# Patient Record
Sex: Female | Born: 1960 | Race: White | Hispanic: No | Marital: Married | Smoking: Never smoker
Health system: Southern US, Community
[De-identification: ages and names within clinical notes are randomized; demographics above are authoritative.]

## PROBLEM LIST (undated history)

## (undated) DIAGNOSIS — R5381 Other malaise: Secondary | ICD-10-CM

## (undated) DIAGNOSIS — I10 Essential (primary) hypertension: Secondary | ICD-10-CM

## (undated) DIAGNOSIS — M2550 Pain in unspecified joint: Secondary | ICD-10-CM

## (undated) DIAGNOSIS — R001 Bradycardia, unspecified: Secondary | ICD-10-CM

## (undated) DIAGNOSIS — R600 Localized edema: Secondary | ICD-10-CM

## (undated) DIAGNOSIS — I480 Paroxysmal atrial fibrillation: Secondary | ICD-10-CM

## (undated) DIAGNOSIS — I4510 Unspecified right bundle-branch block: Secondary | ICD-10-CM

## (undated) DIAGNOSIS — E785 Hyperlipidemia, unspecified: Secondary | ICD-10-CM

## (undated) DIAGNOSIS — M549 Dorsalgia, unspecified: Secondary | ICD-10-CM

## (undated) DIAGNOSIS — G8929 Other chronic pain: Secondary | ICD-10-CM

## (undated) DIAGNOSIS — N200 Calculus of kidney: Secondary | ICD-10-CM

## (undated) DIAGNOSIS — G4730 Sleep apnea, unspecified: Secondary | ICD-10-CM

## (undated) DIAGNOSIS — R5383 Other fatigue: Secondary | ICD-10-CM

## (undated) HISTORY — DX: Hyperlipidemia, unspecified: E78.5

## (undated) HISTORY — DX: Paroxysmal atrial fibrillation: I48.0

## (undated) HISTORY — PX: LITHOTRIPSY: SUR834

## (undated) HISTORY — DX: Essential (primary) hypertension: I10

## (undated) HISTORY — DX: Other fatigue: R53.83

## (undated) HISTORY — DX: Unspecified right bundle-branch block: I45.10

## (undated) HISTORY — PX: INCISION AND DRAINAGE ABSCESS: SHX5864

## (undated) HISTORY — DX: Pain in unspecified joint: M25.50

## (undated) HISTORY — DX: Localized edema: R60.0

## (undated) HISTORY — DX: Other malaise: R53.81

---

## 1998-09-10 ENCOUNTER — Ambulatory Visit (HOSPITAL_COMMUNITY): Admission: RE | Admit: 1998-09-10 | Discharge: 1998-09-10 | Payer: Self-pay | Admitting: *Deleted

## 1998-09-10 ENCOUNTER — Encounter: Payer: Self-pay | Admitting: Family Medicine

## 1999-01-10 ENCOUNTER — Emergency Department (HOSPITAL_COMMUNITY): Admission: EM | Admit: 1999-01-10 | Discharge: 1999-01-10 | Payer: Self-pay | Admitting: Emergency Medicine

## 1999-01-10 ENCOUNTER — Encounter: Payer: Self-pay | Admitting: Emergency Medicine

## 1999-12-08 ENCOUNTER — Emergency Department (HOSPITAL_COMMUNITY): Admission: EM | Admit: 1999-12-08 | Discharge: 1999-12-08 | Payer: Self-pay

## 2001-01-23 ENCOUNTER — Other Ambulatory Visit: Admission: RE | Admit: 2001-01-23 | Discharge: 2001-01-23 | Payer: Self-pay | Admitting: Obstetrics and Gynecology

## 2001-08-01 ENCOUNTER — Emergency Department (HOSPITAL_COMMUNITY): Admission: EM | Admit: 2001-08-01 | Discharge: 2001-08-01 | Payer: Self-pay | Admitting: Emergency Medicine

## 2001-08-01 ENCOUNTER — Encounter: Payer: Self-pay | Admitting: Emergency Medicine

## 2001-08-11 ENCOUNTER — Ambulatory Visit (HOSPITAL_BASED_OUTPATIENT_CLINIC_OR_DEPARTMENT_OTHER): Admission: RE | Admit: 2001-08-11 | Discharge: 2001-08-11 | Payer: Self-pay | Admitting: Urology

## 2001-08-14 ENCOUNTER — Ambulatory Visit (HOSPITAL_COMMUNITY): Admission: RE | Admit: 2001-08-14 | Discharge: 2001-08-14 | Payer: Self-pay | Admitting: Urology

## 2001-08-14 ENCOUNTER — Encounter: Payer: Self-pay | Admitting: Urology

## 2001-08-28 ENCOUNTER — Encounter: Payer: Self-pay | Admitting: Urology

## 2001-08-28 ENCOUNTER — Encounter: Admission: RE | Admit: 2001-08-28 | Discharge: 2001-08-28 | Payer: Self-pay | Admitting: Urology

## 2001-11-28 ENCOUNTER — Emergency Department (HOSPITAL_COMMUNITY): Admission: EM | Admit: 2001-11-28 | Discharge: 2001-11-28 | Payer: Self-pay | Admitting: Emergency Medicine

## 2001-11-29 ENCOUNTER — Emergency Department (HOSPITAL_COMMUNITY): Admission: EM | Admit: 2001-11-29 | Discharge: 2001-11-29 | Payer: Self-pay

## 2002-10-20 ENCOUNTER — Inpatient Hospital Stay (HOSPITAL_COMMUNITY): Admission: EM | Admit: 2002-10-20 | Discharge: 2002-10-22 | Payer: Self-pay | Admitting: Emergency Medicine

## 2002-10-23 ENCOUNTER — Encounter (HOSPITAL_COMMUNITY): Admission: RE | Admit: 2002-10-23 | Discharge: 2002-11-22 | Payer: Self-pay | Admitting: General Surgery

## 2002-12-24 ENCOUNTER — Emergency Department (HOSPITAL_COMMUNITY): Admission: EM | Admit: 2002-12-24 | Discharge: 2002-12-24 | Payer: Self-pay | Admitting: Emergency Medicine

## 2003-10-03 ENCOUNTER — Emergency Department (HOSPITAL_COMMUNITY): Admission: EM | Admit: 2003-10-03 | Discharge: 2003-10-03 | Payer: Self-pay | Admitting: Emergency Medicine

## 2009-10-10 ENCOUNTER — Encounter: Payer: Self-pay | Admitting: Cardiology

## 2009-12-01 ENCOUNTER — Encounter: Payer: Self-pay | Admitting: Cardiology

## 2009-12-15 ENCOUNTER — Encounter: Payer: Self-pay | Admitting: Cardiology

## 2009-12-23 ENCOUNTER — Encounter: Payer: Self-pay | Admitting: Cardiology

## 2010-01-15 ENCOUNTER — Encounter: Payer: Self-pay | Admitting: Cardiology

## 2010-01-16 ENCOUNTER — Ambulatory Visit: Payer: Self-pay | Admitting: Cardiology

## 2010-01-16 DIAGNOSIS — R5381 Other malaise: Secondary | ICD-10-CM | POA: Insufficient documentation

## 2010-01-16 DIAGNOSIS — R5383 Other fatigue: Secondary | ICD-10-CM

## 2010-01-16 DIAGNOSIS — R609 Edema, unspecified: Secondary | ICD-10-CM | POA: Insufficient documentation

## 2010-01-16 DIAGNOSIS — I498 Other specified cardiac arrhythmias: Secondary | ICD-10-CM | POA: Insufficient documentation

## 2010-03-05 ENCOUNTER — Ambulatory Visit: Payer: Self-pay | Admitting: Cardiology

## 2010-03-05 ENCOUNTER — Encounter: Payer: Self-pay | Admitting: Cardiology

## 2010-03-05 ENCOUNTER — Ambulatory Visit: Payer: Self-pay | Admitting: Cardiovascular Disease

## 2010-03-05 ENCOUNTER — Ambulatory Visit: Payer: Self-pay

## 2010-03-05 ENCOUNTER — Ambulatory Visit (HOSPITAL_COMMUNITY): Admission: RE | Admit: 2010-03-05 | Discharge: 2010-03-05 | Payer: Self-pay | Admitting: Cardiology

## 2010-10-09 ENCOUNTER — Telehealth (INDEPENDENT_AMBULATORY_CARE_PROVIDER_SITE_OTHER): Payer: Self-pay | Admitting: *Deleted

## 2010-10-20 NOTE — Letter (Signed)
Summary: [facility] Office Note   [facility] Office Note   Imported By: Roderic Ovens 02/10/2010 14:39:47  _____________________________________________________________________  External Attachment:    Type:   Image     Comment:   External Document

## 2010-10-20 NOTE — Letter (Signed)
Summary: [facility] Office Note  [facility] Office Note   Imported By: Roderic Ovens 02/10/2010 14:38:53  _____________________________________________________________________  External Attachment:    Type:   Image     Comment:   External Document

## 2010-10-20 NOTE — Progress Notes (Signed)
Summary: [facility]   [facility]   Imported By: Roderic Ovens 03/19/2010 10:10:15  _____________________________________________________________________  External Attachment:    Type:   Image     Comment:   External Document

## 2010-10-20 NOTE — Assessment & Plan Note (Signed)
Summary: NP6/HYPERTENSION   Visit Type:  Initial Consult Primary Provider:  Briscoe Burns, PA-C [facility])  CC:  Bradycardia.  History of Present Illness: The patient presents for evaluation of bradycardia. She has no prior cardiac history. However, she has been noted to have bradycardia. She reports that her heart rate is typically in the 50s and though she exercises vigorously and routinely she reports that it does not go up with exercise. Though she has had no frank syncopal episodes she says that she does have occasional episodes of dizziness. She can exercise and does routinely every day. On some days she feels quite fatigued with this though on other days she may do well. She does not describe chest pressure, neck or arm discomfort. She does not describe PND or orthopnea. She thinks her breathing with exercise is reasonable.  Of note the patient has had long-standing diabetes and hypertension. She has been morbidly obese but over 2 years to exercise and diet has lost 120 pounds. As this has happened her blood pressure and diabetes have been easier to control. Recently her hydrochlorothiazide and lisinopril were stopped to see if these might be contributing to bradycardia. When this happened she gained 10 pounds with edema in one week. She restarted those medications and the weight came off. She has since discontinued the lisinopril because blood pressures have been low.  The patient's evaluation has included labs to include a normal thyroid. She wore a Holter monitor which demonstrated bradycardic rates in the 40s and 50s with no apparent sustained pauses. I do not see a report of the chronotropic response or upper limit. There was infrequent ectopy.  Current Medications (verified): 1)  Hydrochlorothiazide 25 Mg Tabs (Hydrochlorothiazide) .Marland Kitchen.. 1 By Mouth Daily 2)  Naprosyn 250 Mg Tabs (Naproxen) .Marland Kitchen.. 1 By Mouth Qam  Allergies (verified): No Known Drug Allergies  Past  History:  Past Medical History: Htn x 10 years Diabetes x 10 years Neprholithiasis  Past Surgical History: C section x 2 Lithotripsy  Family History: There is no family history of early coronary artery disease, cardiomyopathy, syncope, sudden death, arrhythmias.  Social History: The patient is married. She has 2 children. She is a childcare provider. She's never smoked cigarettes and does not drink alcohol.  Review of Systems       Positive for lower extremity edema, joint pains. Otherwise as stated in the history of present illness negative for all other systems.  Vital Signs:  Patient profile:   50 year old female Height:      63 inches Weight:      165 pounds BMI:     29.33 Pulse rate:   46 / minute Resp:     16 per minute BP sitting:   134 / 82  (left arm)  Vitals Entered By: Marrion Coy, CNA (January 16, 2010 10:10 AM)  Physical Exam  General:  Well developed, well nourished, in no acute distress. Head:  normocephalic and atraumatic Eyes:  PERRLA/EOM intact; conjunctiva and lids normal. Mouth:  Teeth, gums and palate normal. Oral mucosa normal. Neck:  Neck supple, no JVD. No masses, thyromegaly or abnormal cervical nodes. Chest Wall:  no deformities or breast masses noted Lungs:  Clear bilaterally to auscultation and percussion. Abdomen:  Bowel sounds positive; abdomen soft and non-tender without masses, organomegaly, or hernias noted. No hepatosplenomegaly. Msk:  Back normal, normal gait. Muscle strength and tone normal. Extremities:  No clubbing or cyanosis. Neurologic:  Alert and oriented x 3. Skin:  Intact  without lesions or rashes. Cervical Nodes:  no significant adenopathy Axillary Nodes:  no significant adenopathy Inguinal Nodes:  no significant adenopathy Psych:  Normal affect.   Detailed Cardiovascular Exam  Neck    Carotids: Carotids full and equal bilaterally without bruits.      Neck Veins: Normal, no JVD.    Heart    Inspection: no  deformities or lifts noted.      Palpation: normal PMI with no thrills palpable.      Auscultation: regular rate and rhythm, S1, S2 without murmurs, rubs, gallops, or clicks.    Vascular    Abdominal Aorta: no palpable masses, pulsations, or audible bruits.      Femoral Pulses: normal femoral pulses bilaterally.      Pedal Pulses: normal pedal pulses bilaterally.      Radial Pulses: normal radial pulses bilaterally.      Peripheral Circulation: no clubbing, cyanosis, or edema noted with normal capillary refill.     EKG  Procedure date:  12/16/2009  Findings:      Sinus bradycardia, rate 43, axis within normal limits, intervals within normal limits, no acute ST-T wave changes  Impression & Recommendations:  Problem # 1:  BRADYCARDIA (ICD-427.89) I walked the patient around the office today and with brisk walking her heart rate went up to 100. I suspect she has a condition heart with resultant bradycardia. However, to further judge chronotropic competence I will put her on a treadmill. This will also allow me to screen for obstructive coronary disease in a patient with significant risk factors. Orders: Treadmill (Treadmill) Echocardiogram (Echo)  Problem # 2:  EDEMA (ICD-782.3) I am quite concerned about the volume retention she had when coming off her diuretic. To further evaluate this she will have an echocardiogram. Orders: Treadmill (Treadmill) Echocardiogram (Echo)  Problem # 3:  FATIGUE (ICD-780.79) This will be evaluated in the context of looking at her bradycardia arrhythmia. Orders: Treadmill (Treadmill) Echocardiogram (Echo)  Patient Instructions: 1)  Your physician recommends that you schedule a follow-up appointment AT TIME OF TREADMILL 2)  Your physician recommends that you continue on your current medications as directed. Please refer to the Current Medication list given to you today. 3)  Your physician has requested that you have an exercise tolerance test.  For  further information please visit [email or web address]  Please also follow instruction sheet, as given. 4)  Your physician has requested that you have an echocardiogram.  Echocardiography is a painless test that uses sound waves to create images of your heart. It provides your doctor with information about the size and shape of your heart and how well your heart's chambers and valves are working.  This procedure takes approximately one hour. There are no restrictions for this procedure.

## 2010-10-20 NOTE — Letter (Signed)
Summary: [facility] Office Note   [facility] Office Note   Imported By: Roderic Ovens 02/10/2010 14:39:28  _____________________________________________________________________  External Attachment:    Type:   Image     Comment:   External Document

## 2010-10-20 NOTE — Letter (Signed)
Summary: [facility] Office Note   [facility] Office Note   Imported By: Roderic Ovens 02/10/2010 14:41:21  _____________________________________________________________________  External Attachment:    Type:   Image     Comment:   External Document

## 2010-10-22 NOTE — Progress Notes (Addendum)
  ROI mailed to Pt  [facility]  October 09, 2010 9:38 AM'    Appended Document:  Patient to come in Friday to pick up LOV, echo and stress test. Papers in drawer. She will bring ROI with her. No charge.   Appended Document:  Pt picked up Records Friday 10/16/10

## 2010-12-07 ENCOUNTER — Institutional Professional Consult (permissible substitution): Payer: Self-pay | Admitting: Internal Medicine

## 2011-02-05 NOTE — Op Note (Signed)
[facility]  Patient:    Kathryn Merritt, Kathryn Merritt Visit Number: [id number] MRN: [id number]          Service Type: NES Location: NESC Attending Physician:  Londell Moh Dictated by:   Jamison Neighbor, M.D. Proc. Date: 08/11/01 Admit Date:  08/11/2001   CC:         Jamison Neighbor, M.D.  Meredith Staggers, M.D.   Operative Report  PREOPERATIVE DIAGNOSIS:  Left renal calculus.  POSTOPERATIVE DIAGNOSIS:  Left renal calculus.  PROCEDURE:  Cystoscopy, left retrograde and left double-J catheter placement.  SURGEON:  Jamison Neighbor, M.D.  ANESTHESIA:  General.  COMPLICATIONS:  None.  DRAINS: 6 French x 26 cm double-J catheter.  BRIEF HISTORY:  This 50 year old female was found to have a large stone on the left ureter. The patient requires ESWL but due to the large size of this stone, it is not felt this can be done without preplacement of a stent. The patient understands the risk and benefits of the procedure and gave full and informed consent.  DESCRIPTION OF PROCEDURE:  After successful induction of general anesthesia, the patient was placed in the dorsal lithotomy position, prepped with Betadine, and draped in the usual sterile fashion. Cystoscopy was performed and bladder carefully inspected. It was free of any tumor or stone. Both _______ were normal configuration and location. A retrograde study performed on the left hand side showed a normal caliber ureter which shows dye completely filled the main portion of the pelvis. The collecting system was not particularly dilated. A guide wire was passed beyond the stone up into the upper pole. Under direct vision, the 26 cm double-J catheter was passed up beyond the stone. It was allowed to coil in the upper pole as there was no place in the pelvis for it to coil. It also coiled normally in the bladder. The bladder was drained. The patient tolerated the procedure well and was taken to the  recovery room in good condition. _______ was given. Because she will be receiving ESWL on Monday, November 25, she was not given Toradol. The patient was sent home with a prescription for Lorcet Plus, Pyridium Plus, and Macrobid and will be followed after ESWL. Dictated by:   Jamison Neighbor, M.D. Attending Physician:  Londell Moh DD:  08/11/01 TD:  08/13/01 Job: [id number]

## 2011-02-05 NOTE — Op Note (Signed)
   NAME:  Kathryn Merritt, Kathryn Merritt                          ACCOUNT NO.:  [id number]   MEDICAL RECORD NO.:  [id number]                   PATIENT TYPE:  INP   LOCATION:  A337                                 FACILITY:  [facility]   PHYSICIAN:  Barbaraann Barthel, M.D.              DATE OF BIRTH:  1960/09/25   DATE OF PROCEDURE:  10/21/2002  DATE OF DISCHARGE:                                 OPERATIVE REPORT   PREOPERATIVE DIAGNOSIS:  Right posterior line axillary abscess.   POSTOPERATIVE DIAGNOSIS:  Right posterior line axillary abscess.   PROCEDURE:  Excision and drainage and debridement with packing and cultures  obtained of right axillary abscess.   SURGEON:  Barbaraann Barthel, M.D.   SPECIMEN:  Debrided tissue and cultures.   INDICATIONS:  This is a 50 year old white female, morbidly obese, type 2  diabetic who presented with an abscess in her right axilla.  After  correcting her electrolytes and assuring that her blood sugars were in order  we planned to take her to the operating room.  We discussed the surgery in  detail with the patient including complications not limited to, but  including bleeding, infection, the possibility that further surgery might be  required, and informed consent was obtained.   GROSS OPERATIVE FINDINGS:  Abscess that likely emanated from a folliculitis.  This had a deep abscess in the rather substantial amount of subcutaneous  tissue.  Cultures were obtained.   DESCRIPTION OF PROCEDURE:  The patient was placed in the supine position and  positioned with sandbags so that she was turned slightly to her left.  The  area was prepped with Betadine solution and draped in the usual manner.  Incision was carried out removing the infected folliculitis as well as  draining the abscess that was deeper to this.  This was copiously irrigated  with normal saline solution.  Bleeding was controlled with the cautery  device, and  the wound was packed with a 3 inch Kling roll of  gauze after irrigating.  An  ABD dressing and a sterile dressing was applied.  Prior to closure, all  sponge, needle and instrument counts were found to be correct.  Estimated  blood loss was minimal.  The patient received 500 mL of crystalloid  intraoperatively.  There were no complications.                                               Barbaraann Barthel, M.D.    WB/MEDQ  D:  10/21/2002  T:  10/21/2002  Job:  [id number]   cc:   Janetta Hora. Hulan Saas, M.D.  [address]  [place]  [place] [address]  Fax: [phone]

## 2011-02-05 NOTE — Discharge Summary (Signed)
NAME:  Kathryn Merritt, Kathryn Merritt                          ACCOUNT NO.:  [id number]   MEDICAL RECORD NO.:  [id number]                   PATIENT TYPE:  INP   LOCATION:  A337                                 FACILITY:  [facility]   PHYSICIAN:  Barbaraann Barthel, M.D.              DATE OF BIRTH:  [birth year]   DATE OF ADMISSION:  DATE OF DISCHARGE:  10/22/2002                                 DISCHARGE SUMMARY   PRINCIPAL DIAGNOSIS:  Axillary abscess.   SECONDARY DIAGNOSES:  1. Type 2 diabetes mellitus.  2. Hypertension.  3. Morbid obesity.  4. Hypokalemia.   PROCEDURE:  On 10/21/2002 excision and drainage of debridement with packing  of right axillary abscess.   NOTE:  This is a 50 year old white female who is morbidly obese, a type 2  diabetic; who presented with an abscess in the right axilla. She was seen in  the emergency room and admitted.  She had mild hypokalemia which was  corrected preoperatively. She was placed on antibiotics.  Her blood sugar  was monitored with sliding scale, and she was taken to surgery on 10/21/2002  at which time an abscess was debrided and an incision and drainage was  performed and cultures were obtained.   The patient's hospital course was uneventful.  She was continued on  antibiotics parenterally.  Her packing was removed; and the wound was  irrigated and she was discharged on the first postoperative day.  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  She had mild hypokalemia, 3.3  on admission, this was corrected to 3.6 at the time of surgery.  On  10/20/2002 the white count was 12.8 with an H&H of 12.1 and 36.1.  Her blood  sugars on admission were 282.  These were controlled with sliding scales and  Accu-Checks.  The cultures showed rare white blood cells present  predominately polymorphonuclear cells.  No organisms were retrieved.  The  culture showed moderate Staphylococcus  aureus.   DISCHARGE INSTRUCTIONS:  1. The patient was excused from work.  2. Discharged on a 2000 ADA diet.  3. She is told to shower.  4. Told to do no heavy lifting or straining.  5. She is to take Darvocet-N 100 one tablet q.4h. as needed for pain, 500 mg     p.o. b.i.d. for 7 days.  6. No aspirin products.  7.     She is told to follow up with the physical therapy department with whom     plans are made for wound care and I will follow this patient in that     venue.  8. She is told to contact us if there are any acute changes.  Barbaraann Barthel, M.D.    WB/MEDQ  D:  12/21/2002  T:  12/23/2002  Job:  [id number]

## 2011-02-05 NOTE — Consult Note (Signed)
NAME:  Kathryn Merritt, Kathryn Merritt                          ACCOUNT NO.:  [id number]   MEDICAL RECORD NO.:  [id number]                   PATIENT TYPE:  EMS   LOCATION:  ED                                   FACILITY:  [facility]   PHYSICIAN:  Barbaraann Barthel, M.D.              DATE OF BIRTH:  Jul 30, 1961   DATE OF CONSULTATION:  10/20/2002  DATE OF DISCHARGE:                                   CONSULTATION   REASON FOR CONSULTATION:  Surgery was asked to see this 50 year old white  female for pain in her right axilla area.   HISTORY OF PRESENT MEDICAL ILLNESS:  The patient states that she had a  pimple in her right posterior axilla for at least 24 hours and she squeezed  this and it became more painful and inflamed.  She came to the emergency  room with an obvious abscess in this area.  Surgery was consulted and  responded immediately.  Other details of significance is that this patient  is a type 2 diabetic.  She states that she is not taking her medications on  any regular basis due to economic concerns and she is somewhat unreliable.  It is for this reason that I have decided to bring her into the hospital.  She unfortunately has eaten a large breakfast and we will plan to initiate  antibiotics and plan for an I&D of her abscess tomorrow.  We will monitor  her blood sugars as well.   PHYSICAL EXAMINATION:  GENERAL:  Discloses a 50 year old obese female.  She  is 5 feet 4 inches, weighs 290 pounds.  VITAL SIGNS:  Temperature 99.9, blood pressure 189/83, pulse rate 105,  respirations 20.  HEENT:  Head is normocephalic.  Eyes show extraocular movements are intact.  Pupils are round and reactive to light and accommodation.  There is no  conjunctival pallor or scleral injection.  Sclerae has a normal tincture.  Nose and oral mucosa are moist.  NECK:  Without jugular vein distention, thyromegaly, or tracheal deviation.  No bruits are ausculted and there is no adenopathy.  CHEST:  Clear both to anterior  and posterior auscultation.  HEART:  Regular rhythm.  BREASTS:  Large and pendulous.  No masses are appreciated.  Axilla shows the  patient has obvious erythema with induration in the area of the posterior  axillary line on the right side.  This is unassociated with any breast  lesions or nipple discharge or any sign suggesting this has any breast  origin.  The patient, however, has never had a mammogram.  ABDOMEN:  Quite obese.  There are no obvious masses.  RECTAL AND PELVIC:  Examination was deferred.  EXTREMITIES:  Grossly within normal limits.   REVIEW OF SYSTEMS:  ENDOCRINE:  Type 2 diabetic and she takes Glucophage  when she takes it, 50 mg p.o. daily.  She has no history of thyroid disease.  CARDIOVASCULAR:  She is a nonsmoker, nondrinker.  She takes Cozaar 50 mg  daily for her hypertension.  However, she does not take her medicine on a  regular basis.  OB/GYN:  She is a gravida 2 para 0 cesarean 2 female; no  family history of carcinoma of the breast.  We will check a serum hCG on  her.  MUSCULOSKELETAL:  She is obviously morbidly obese for her size.  UROLOGIC:  The patient has had a history of kidney stones in the past.  She  has no dysuria at present.  She has required lithotripsy for left-sided  kidney stones.    MEDICATIONS:  1. Cozaar 50 mg p.o. daily.  2. Glucophage 500 mg p.o. daily.   ALLERGIES:  No known allergies.   LABORATORY DATA:  The patient has a white count of 12.8 with a hemoglobin of  12.1 and 36.1 with 85% neutrophils noted.  Her electrolytes show sodium 134,  potassium 3.3, chloride 104, glucose 282, BUN 9, and creatinine 0.7.   IMPRESSION:  1. Type 2 diabetes mellitus.  2. Hypertension.  3. Morbid obesity.  4. Mild hypokalemia.  5. Present problem is that of a right posterior axillary line abscess.   PLAN:  We will give Rocephin 1 gram IV q.12h. and correct her hyponatremia.  Will apply warm compresses to her abscess and control her blood  sugars.  Plan for an I&D in the a.m.                                               Barbaraann Barthel, M.D.    WB/MEDQ  D:  10/20/2002  T:  10/20/2002  Job:  [id number]

## 2012-05-19 ENCOUNTER — Ambulatory Visit: Payer: Self-pay | Admitting: Cardiology

## 2012-06-13 ENCOUNTER — Ambulatory Visit: Payer: Self-pay | Admitting: Cardiology

## 2012-06-23 ENCOUNTER — Ambulatory Visit (INDEPENDENT_AMBULATORY_CARE_PROVIDER_SITE_OTHER): Payer: PRIVATE HEALTH INSURANCE | Admitting: Cardiology

## 2012-06-23 ENCOUNTER — Encounter: Payer: Self-pay | Admitting: Cardiology

## 2012-06-23 VITALS — BP 160/100 | HR 49 | Ht 63.0 in | Wt 207.0 lb

## 2012-06-23 DIAGNOSIS — R5383 Other fatigue: Secondary | ICD-10-CM

## 2012-06-23 DIAGNOSIS — R5381 Other malaise: Secondary | ICD-10-CM

## 2012-06-23 DIAGNOSIS — I498 Other specified cardiac arrhythmias: Secondary | ICD-10-CM

## 2012-06-23 DIAGNOSIS — R001 Bradycardia, unspecified: Secondary | ICD-10-CM

## 2012-06-23 DIAGNOSIS — R0602 Shortness of breath: Secondary | ICD-10-CM

## 2012-06-23 LAB — BRAIN NATRIURETIC PEPTIDE: Pro B Natriuretic peptide (BNP): 102 pg/mL — ABNORMAL HIGH (ref 0.0–100.0)

## 2012-06-23 LAB — T3, FREE: T3, Free: 2.4 pg/mL (ref 2.3–4.2)

## 2012-06-23 NOTE — Patient Instructions (Addendum)
The current medical regimen is effective;  continue present plan and medications.  Please have blood work today  Your physician has requested that you have an echocardiogram. Echocardiography is a painless test that uses sound waves to create images of your heart. It provides your doctor with information about the size and shape of your heart and how well your heart's chambers and valves are working. This procedure takes approximately one hour. There are no restrictions for this procedure.  Follow  Up in 1 month with Dr Antoine Poche.

## 2012-06-23 NOTE — Progress Notes (Signed)
HPI The patient presents for the first time in a couple of years. I saw her for bradycardia some years ago. There was no indication for a pacemaker and she had a normal echo. However, she's had progressive shortness of breath and weight gain. A year ago she was referred to a cardiologist in [facility] when her primary doctor noticed a murmur. She was found to have some TR, pulmonary hypertension and subsequently diagnosed with sleep apnea. She hasn't been able to wear the CPAP and has not wanted to followup with this group. She was trying to exercise and lose weight. Unfortunately she has gained significant weight. She doesn't exercise anymore and has had progressive dyspnea is doing usual activities. She said she tried to move a lawn and she was completely fatigued and short of breath. She's had some swelling and has been treated with Lasix. She thinks her blood pressures have been elevated. She denies any PND or orthopnea. She's not having any new palpitations, presyncope or syncope. She has some atypical nonreproducible chest discomfort that is sporadic. She does have some symptoms consistent with depression. She was being treated but didn't think that this helped.  No Known Allergies  Current Outpatient Prescriptions  Medication Sig Dispense Refill  . ferrous fumarate (HEMOCYTE - 106 MG FE) 325 (106 FE) MG TABS Take 1 tablet by mouth.      . furosemide (LASIX) 40 MG tablet Take 40 mg by mouth daily.      . hydrochlorothiazide (HYDRODIURIL) 25 MG tablet Take 25 mg by mouth daily.      . naproxen (NAPROSYN) 250 MG tablet Take 250 mg by mouth every morning.      . NON FORMULARY daily. [employer]        Past Medical History  Diagnosis Date  . Hypertension   . Diabetes mellitus   . Nephrolithiasis   . Joint pain   . Edema leg   . Other malaise and fatigue   . Other specified cardiac dysrhythmias     Past Surgical History  Procedure Date  . Cesarean section     times 2  . Lithotripsy      ROS: As stated in the HPI and negative for all other systems.   PHYSICAL EXAM BP 160/100  Pulse 49  Ht 5\' 3"  (1.6 m)  Wt 93.895 kg (207 lb)  BMI 36.67 kg/m2 GENERAL:  Well appearing HEENT:  Pupils equal round and reactive, fundi not visualized, oral mucosa unremarkable NECK:  No jugular venous distention, waveform within normal limits, carotid upstroke brisk and symmetric, no bruits, no thyromegaly LYMPHATICS:  No cervical, inguinal adenopathy LUNGS:  Clear to auscultation bilaterally BACK:  No CVA tenderness CHEST:  Unremarkable HEART:  PMI not displaced or sustained,S1 and S2 within normal limits, no S3, no S4, no clicks, no rubs, no murmurs ABD:  Flat, positive bowel sounds normal in frequency in pitch, no bruits, no rebound, no guarding, no midline pulsatile mass, no hepatomegaly, no splenomegaly EXT:  2 plus pulses throughout, no edema, no cyanosis no clubbing SKIN:  No rashes no nodules NEURO:  Cranial nerves II through XII grossly intact, motor grossly intact throughout PSYCH:  Cognitively intact, oriented to person place and time  EKG:  Sinus bradycardia, rate 49, right axis deviation, intervals within normal limits, no acute ST-T wave changes.  06/23/2012  ASSESSMENT AND PLAN  FATIGUE I think this is multifactorial. Certainly sleep apnea contributes. I think she has depression and I discussed this with  her. She needs followup with a psychiatrist. In addition I will check a TSH T3 and T4. I would like to refer her to one of our sleep physicians but I would like to get the results of her previous sleep study first to understand the severity of this.  DYSPNEA Again this is probably multifactorial. I don't suspect heart failure or significant pulmonary hypertension but I will start with a BNP level and an echocardiogram.  BRADYCARDIA  This seems to be baseline and I will followup in the future with some evaluation of chronotropic competence. She had a normal heart rate  response despite her resting bradycardia in the past.  EDEMA  For now she will continue her diuretic. She does not seem to be excessively volume overloaded at present.  HTN She's not sure that it is elevated elsewhere and it has been at other appointments. She will get a blood pressure cuff and keep a diary.   (Greater than 40 minutes reviewing all data with greater than 50% face to face with the patient).

## 2012-06-29 ENCOUNTER — Other Ambulatory Visit (HOSPITAL_COMMUNITY): Payer: PRIVATE HEALTH INSURANCE

## 2012-07-04 ENCOUNTER — Ambulatory Visit (HOSPITAL_COMMUNITY): Payer: PRIVATE HEALTH INSURANCE | Attending: Cardiology | Admitting: Radiology

## 2012-07-04 DIAGNOSIS — I495 Sick sinus syndrome: Secondary | ICD-10-CM

## 2012-07-04 DIAGNOSIS — R001 Bradycardia, unspecified: Secondary | ICD-10-CM

## 2012-07-04 DIAGNOSIS — I498 Other specified cardiac arrhythmias: Secondary | ICD-10-CM | POA: Insufficient documentation

## 2012-07-04 DIAGNOSIS — I10 Essential (primary) hypertension: Secondary | ICD-10-CM | POA: Insufficient documentation

## 2012-07-04 DIAGNOSIS — R002 Palpitations: Secondary | ICD-10-CM | POA: Insufficient documentation

## 2012-07-04 DIAGNOSIS — E119 Type 2 diabetes mellitus without complications: Secondary | ICD-10-CM | POA: Insufficient documentation

## 2012-07-04 DIAGNOSIS — I369 Nonrheumatic tricuspid valve disorder, unspecified: Secondary | ICD-10-CM | POA: Insufficient documentation

## 2012-07-04 DIAGNOSIS — I059 Rheumatic mitral valve disease, unspecified: Secondary | ICD-10-CM | POA: Insufficient documentation

## 2012-07-04 DIAGNOSIS — I379 Nonrheumatic pulmonary valve disorder, unspecified: Secondary | ICD-10-CM | POA: Insufficient documentation

## 2012-07-04 NOTE — Progress Notes (Signed)
Echocardiogram performed.  

## 2012-07-05 ENCOUNTER — Telehealth: Payer: Self-pay | Admitting: Cardiology

## 2012-07-05 NOTE — Telephone Encounter (Signed)
Patient returning nurse call regarding test results, she can be reached at hm#

## 2012-07-05 NOTE — Telephone Encounter (Signed)
Pt aware of results of lab and echo

## 2012-07-05 NOTE — Telephone Encounter (Signed)
Mail box is unable to except phone messages at this time.

## 2012-07-26 ENCOUNTER — Ambulatory Visit: Payer: PRIVATE HEALTH INSURANCE | Admitting: Cardiology

## 2012-11-04 ENCOUNTER — Other Ambulatory Visit: Payer: Self-pay

## 2013-06-05 ENCOUNTER — Emergency Department (HOSPITAL_COMMUNITY): Payer: 59

## 2013-06-05 ENCOUNTER — Emergency Department (HOSPITAL_COMMUNITY)
Admission: EM | Admit: 2013-06-05 | Discharge: 2013-06-05 | Disposition: A | Discharge status: Home / Self Care | Payer: 59 | Attending: Emergency Medicine | Admitting: Emergency Medicine

## 2013-06-05 ENCOUNTER — Encounter (HOSPITAL_COMMUNITY): Payer: Self-pay | Admitting: Emergency Medicine

## 2013-06-05 DIAGNOSIS — Z79899 Other long term (current) drug therapy: Secondary | ICD-10-CM | POA: Insufficient documentation

## 2013-06-05 DIAGNOSIS — I10 Essential (primary) hypertension: Secondary | ICD-10-CM | POA: Insufficient documentation

## 2013-06-05 DIAGNOSIS — G8929 Other chronic pain: Secondary | ICD-10-CM | POA: Insufficient documentation

## 2013-06-05 DIAGNOSIS — Z8669 Personal history of other diseases of the nervous system and sense organs: Secondary | ICD-10-CM | POA: Insufficient documentation

## 2013-06-05 DIAGNOSIS — Z791 Long term (current) use of non-steroidal anti-inflammatories (NSAID): Secondary | ICD-10-CM | POA: Insufficient documentation

## 2013-06-05 DIAGNOSIS — M545 Low back pain, unspecified: Secondary | ICD-10-CM | POA: Insufficient documentation

## 2013-06-05 DIAGNOSIS — Z87442 Personal history of urinary calculi: Secondary | ICD-10-CM | POA: Insufficient documentation

## 2013-06-05 DIAGNOSIS — R609 Edema, unspecified: Secondary | ICD-10-CM | POA: Insufficient documentation

## 2013-06-05 DIAGNOSIS — E119 Type 2 diabetes mellitus without complications: Secondary | ICD-10-CM | POA: Insufficient documentation

## 2013-06-05 HISTORY — DX: Calculus of kidney: N20.0

## 2013-06-05 HISTORY — DX: Other chronic pain: G89.29

## 2013-06-05 HISTORY — DX: Dorsalgia, unspecified: M54.9

## 2013-06-05 HISTORY — DX: Sleep apnea, unspecified: G47.30

## 2013-06-05 HISTORY — DX: Bradycardia, unspecified: R00.1

## 2013-06-05 MED ORDER — OXYCODONE-ACETAMINOPHEN 5-325 MG PO TABS
2.0000 | ORAL_TABLET | Freq: Once | ORAL | Status: AC
  Administered 2013-06-05: 2 via ORAL
  Filled 2013-06-05: qty 2

## 2013-06-05 MED ORDER — METHOCARBAMOL 500 MG PO TABS: 1000.0000 mg | ORAL_TABLET | Freq: Four times a day (QID) | ORAL | Status: DC | PRN

## 2013-06-05 MED ORDER — DIAZEPAM 5 MG PO TABS
5.0000 mg | ORAL_TABLET | Freq: Once | ORAL | Status: AC
  Administered 2013-06-05: 5 mg via ORAL
  Filled 2013-06-05: qty 1

## 2013-06-05 MED ORDER — OXYCODONE-ACETAMINOPHEN 5-325 MG PO TABS: ORAL_TABLET | ORAL | Status: DC

## 2013-06-05 MED ORDER — NAPROXEN 250 MG PO TABS: 250.0000 mg | ORAL_TABLET | Freq: Two times a day (BID) | ORAL | Status: DC

## 2013-06-05 NOTE — ED Provider Notes (Signed)
CSN: [id number]     Arrival date & time 06/05/13  1914 History   First MD Initiated Contact with Patient 06/05/13 [phone]     Chief Complaint  Patient presents with  . Back Pain   HPI Pt was seen at 0750. Per pt, c/o gradual onset and persistence of constant right sided low back "pain" for the past 3 days. Describes the pain as "throbbing." Pain worsens with palpation of the area and body position changes. Pt states she was "doing a lot around the house" the day before the pain began. Denies incont/retention of bowel or bladder, no saddle anesthesia, no focal motor weakness, no tingling/numbness in extremities, no fevers, no injury, no abd pain, no N/V/D.  The symptoms have been associated with no other complaints.    Ortho: Dr. Shelle Iron Past Medical History  Diagnosis Date  . Hypertension   . Diabetes mellitus   . Joint pain   . Edema leg   . Other malaise and fatigue   . Kidney stones   . Sinus bradycardia   . Sleep apnea   . Chronic back pain    Past Surgical History  Procedure Laterality Date  . Cesarean section      times 2  . Lithotripsy      History  Substance Use Topics  . Smoking status: Never Smoker   . Smokeless tobacco: Not on file  . Alcohol Use: No    Review of Systems ROS: Statement: All systems negative except as marked or noted in the HPI; Constitutional: Negative for fever and chills. ; ; Eyes: Negative for eye pain, redness and discharge. ; ; ENMT: Negative for ear pain, hoarseness, nasal congestion, sinus pressure and sore throat. ; ; Cardiovascular: Negative for chest pain, palpitations, diaphoresis, dyspnea and peripheral edema. ; ; Respiratory: Negative for cough, wheezing and stridor. ; ; Gastrointestinal: Negative for nausea, vomiting, diarrhea, abdominal pain, blood in stool, hematemesis, jaundice and rectal bleeding. . ; ; Genitourinary: Negative for dysuria, flank pain and hematuria. ; ; Musculoskeletal: +LBP. Negative for neck pain. Negative for swelling  and trauma.; ; Skin: Negative for pruritus, rash, abrasions, blisters, bruising and skin lesion.; ; Neuro: Negative for headache, lightheadedness and neck stiffness. Negative for weakness, altered level of consciousness , altered mental status, extremity weakness, paresthesias, involuntary movement, seizure and syncope.       Allergies  Review of patient's allergies indicates no known allergies.  Home Medications   Current Outpatient Rx  Name  Route  Sig  Dispense  Refill  . ferrous fumarate (HEMOCYTE - 106 MG FE) 325 (106 FE) MG TABS   Oral   Take 1 tablet by mouth.         . furosemide (LASIX) 40 MG tablet   Oral   Take 40 mg by mouth daily.         . hydrochlorothiazide (HYDRODIURIL) 25 MG tablet   Oral   Take 25 mg by mouth daily.         . naproxen (NAPROSYN) 250 MG tablet   Oral   Take 250 mg by mouth every morning.         . NON FORMULARY      daily. [employer]          BP 143/73  Pulse 51  Temp(Src) 98.6 F (37 C) (Oral)  Resp 20  SpO2 99% Physical Exam 0755: Physical examination:  Nursing notes reviewed; Vital signs and O2 SAT reviewed;  Constitutional: Well developed, Well nourished, Well  hydrated, In no acute distress; Head:  Normocephalic, atraumatic; Eyes: EOMI, PERRL, No scleral icterus; ENMT: Mouth and pharynx normal, Mucous membranes moist; Neck: Supple, Full range of motion, No lymphadenopathy; Cardiovascular: Regular rate and rhythm, No murmur, rub, or gallop; Respiratory: Breath sounds clear & equal bilaterally, No rales, rhonchi, wheezes.  Speaking full sentences with ease, Normal respiratory effort/excursion; Chest: Nontender, Movement normal; Abdomen: Soft, Nontender, Nondistended, Normal bowel sounds; Genitourinary: No CVA tenderness; Spine:  No midline CS, TS, LS tenderness. +TTP right lumbar paraspinal muscles.;; Extremities: Pulses normal, No tenderness, No edema, No calf edema or asymmetry.; Neuro: AA&Ox3, Major CN grossly intact. Speech  clear. Strength 5/5 equal bilat UE's and LE's, including great toe dorsiflexion.  DTR 2/4 equal bilat UE's and LE's.  No gross sensory deficits.  Neg straight leg raises bilat. Climbs on and off chair to stretcher easily by herself. Gait steady.; Skin: Color normal, Warm, Dry.   ED Course  Procedures    MDM  MDM Reviewed: previous chart, nursing note and vitals Interpretation: x-ray   Dg Lumbar Spine Complete 06/05/2013   *RADIOLOGY REPORT*  Clinical Data: Back pain.  History of slipped disc.  LUMBAR SPINE - COMPLETE 4+ VIEW  Comparison: None.  Findings: Mild to moderate multilevel lumbar degenerative disc disease is present.  Disc space narrowing is present at L3-L4, L4- L5 and L5-S1 with mild disc degeneration at L2-L3.  There are no pars defects.  Sacralization of the left L5 transverse process is present.  The alignment is anatomic.  Vertebral body height is preserved.  Radiopaque capsules are present in the enteric stream.  IMPRESSION: Mild to moderate multilevel lumbar spondylosis.  No acute osseous abnormality.   Original Report Authenticated By: Andreas Newport, M.D.    [email or web address]:  Pt with hx LBP.  Pt endorses she has been evaluated by Ortho Dr. Shelle Iron and went to physical therapy for same. States she was told "an operation would be 50-50 in improving my pain."  States she was "doing a lot around the house" over the weekend before her pain flaired. Denies any change in her usual chronic pain. No red flags on HPI or PE. Feels better after meds and wants to go home now. Will tx symptomatically. Dx and testing d/w pt and family.  Questions answered.  Verb understanding, agreeable to d/c home with outpt f/u.   Laray Anger, DO 06/08/13 1215

## 2013-06-05 NOTE — ED Notes (Signed)
MD at bedside. 

## 2013-06-05 NOTE — ED Notes (Signed)
Pt reports woke up with r sided lower back pain Sunday.   Denies injury.  Has been taking ibuprofen.  Last dose was around 5am this morning.

## 2013-07-26 ENCOUNTER — Other Ambulatory Visit: Payer: Self-pay

## 2014-11-03 ENCOUNTER — Emergency Department (HOSPITAL_COMMUNITY): Payer: Self-pay

## 2014-11-03 ENCOUNTER — Emergency Department (HOSPITAL_COMMUNITY)
Admission: EM | Admit: 2014-11-03 | Discharge: 2014-11-03 | Disposition: A | Discharge status: Home / Self Care | Payer: Self-pay | Attending: Emergency Medicine | Admitting: Emergency Medicine

## 2014-11-03 ENCOUNTER — Encounter (HOSPITAL_COMMUNITY): Payer: Self-pay | Admitting: Emergency Medicine

## 2014-11-03 DIAGNOSIS — G8929 Other chronic pain: Secondary | ICD-10-CM | POA: Insufficient documentation

## 2014-11-03 DIAGNOSIS — E669 Obesity, unspecified: Secondary | ICD-10-CM | POA: Insufficient documentation

## 2014-11-03 DIAGNOSIS — I10 Essential (primary) hypertension: Secondary | ICD-10-CM | POA: Insufficient documentation

## 2014-11-03 DIAGNOSIS — E119 Type 2 diabetes mellitus without complications: Secondary | ICD-10-CM | POA: Insufficient documentation

## 2014-11-03 DIAGNOSIS — Z87442 Personal history of urinary calculi: Secondary | ICD-10-CM | POA: Insufficient documentation

## 2014-11-03 DIAGNOSIS — Z79899 Other long term (current) drug therapy: Secondary | ICD-10-CM | POA: Insufficient documentation

## 2014-11-03 DIAGNOSIS — N898 Other specified noninflammatory disorders of vagina: Secondary | ICD-10-CM | POA: Insufficient documentation

## 2014-11-03 DIAGNOSIS — R1011 Right upper quadrant pain: Secondary | ICD-10-CM | POA: Insufficient documentation

## 2014-11-03 LAB — URINALYSIS, ROUTINE W REFLEX MICROSCOPIC
Bilirubin Urine: NEGATIVE
GLUCOSE, UA: 100 mg/dL — AB
KETONES UR: NEGATIVE mg/dL
Leukocytes, UA: NEGATIVE
NITRITE: NEGATIVE
PH: 6 (ref 5.0–8.0)
Urobilinogen, UA: 0.2 mg/dL (ref 0.0–1.0)

## 2014-11-03 LAB — CBC WITH DIFFERENTIAL/PLATELET
BASOS ABS: 0.1 10*3/uL (ref 0.0–0.1)
Basophils Relative: 1 % (ref 0–1)
Eosinophils Absolute: 0.2 10*3/uL (ref 0.0–0.7)
Eosinophils Relative: 3 % (ref 0–5)
HEMATOCRIT: 44.6 % (ref 36.0–46.0)
Hemoglobin: 14.6 g/dL (ref 12.0–15.0)
LYMPHS ABS: 2.9 10*3/uL (ref 0.7–4.0)
Lymphocytes Relative: 40 % (ref 12–46)
MCH: 27.9 pg (ref 26.0–34.0)
MCHC: 32.7 g/dL (ref 30.0–36.0)
MCV: 85.1 fL (ref 78.0–100.0)
Monocytes Absolute: 0.5 10*3/uL (ref 0.1–1.0)
Monocytes Relative: 6 % (ref 3–12)
NEUTROS ABS: 3.7 10*3/uL (ref 1.7–7.7)
NEUTROS PCT: 50 % (ref 43–77)
Platelets: 278 10*3/uL (ref 150–400)
RBC: 5.24 MIL/uL — AB (ref 3.87–5.11)
RDW: 15.1 % (ref 11.5–15.5)
WBC: 7.4 10*3/uL (ref 4.0–10.5)

## 2014-11-03 LAB — COMPREHENSIVE METABOLIC PANEL
ALK PHOS: 69 U/L (ref 39–117)
ALT: 21 U/L (ref 0–35)
ANION GAP: 6 (ref 5–15)
AST: 19 U/L (ref 0–37)
Albumin: 3.6 g/dL (ref 3.5–5.2)
BILIRUBIN TOTAL: 0.5 mg/dL (ref 0.3–1.2)
BUN: 17 mg/dL (ref 6–23)
CALCIUM: 9.1 mg/dL (ref 8.4–10.5)
CO2: 27 mmol/L (ref 19–32)
CREATININE: 0.98 mg/dL (ref 0.50–1.10)
Chloride: 107 mmol/L (ref 96–112)
GFR calc Af Amer: 76 mL/min — ABNORMAL LOW (ref >90)
GFR calc non Af Amer: 65 mL/min — ABNORMAL LOW (ref >90)
Glucose, Bld: 203 mg/dL — ABNORMAL HIGH (ref 70–99)
POTASSIUM: 3.2 mmol/L — AB (ref 3.5–5.1)
SODIUM: 140 mmol/L (ref 135–145)
Total Protein: 7.6 g/dL (ref 6.0–8.3)

## 2014-11-03 LAB — URINE MICROSCOPIC-ADD ON

## 2014-11-03 LAB — LIPASE, BLOOD: Lipase: 43 U/L (ref 11–59)

## 2014-11-03 MED ORDER — IOHEXOL 300 MG/ML  SOLN
50.0000 mL | Freq: Once | INTRAMUSCULAR | Status: AC | PRN
  Administered 2014-11-03: 50 mL via ORAL

## 2014-11-03 MED ORDER — IOHEXOL 300 MG/ML  SOLN
100.0000 mL | Freq: Once | INTRAMUSCULAR | Status: AC | PRN
  Administered 2014-11-03: 100 mL via INTRAVENOUS

## 2014-11-03 MED ORDER — HYDROCODONE-ACETAMINOPHEN 5-325 MG PO TABS: 1.0000 | ORAL_TABLET | ORAL | Status: DC | PRN

## 2014-11-03 NOTE — ED Provider Notes (Signed)
CSN: [id number]     Arrival date & time 11/03/14  1114 History  This chart was scribed for Nat Christen, MD by Zola Button, ED Scribe. This patient was seen in room APA14/APA14 and the patient's care was started at 11:50 AM.     Chief Complaint  Patient presents with  . Abdominal Pain   The history is provided by the patient. No language interpreter was used.   HPI Comments: Kathryn Merritt is a 54 y.o. female with a hx of HTN, DM and heart murmur who presents to the Emergency Department complaining of gradual onset, intermittent, squeezing, gradually worsening RLQ abdominal pain that started several months ago but worsened recently. Patient also reports having occasional episodes of nausea and occasional creamy colored vaginal discharge with odor. The pain is worsened with bending over and applied pressure such as leaning on something. The pain is not associated with eating or meals. Patient denies vomiting, diarrhea, vaginal bleeding, back pain, leg problems and urinary symptoms. She is married and sexually active with occasional intercourse, no protection used. She notes that she was diagnosed with a polyp on her uterus in 2001.  PCP: Dr. Baird [facility] at [facility]  Past Medical History  Diagnosis Date  . Hypertension   . Diabetes mellitus   . Joint pain   . Edema leg   . Other malaise and fatigue   . Kidney stones   . Sinus bradycardia   . Sleep apnea   . Chronic back pain    Past Surgical History  Procedure Laterality Date  . Cesarean section      times 2  . Lithotripsy     History reviewed. No pertinent family history. History  Substance Use Topics  . Smoking status: Never Smoker   . Smokeless tobacco: Not on file  . Alcohol Use: No   OB History    No data available     Review of Systems  Cardiovascular: Negative for leg swelling.  Gastrointestinal: Positive for nausea and abdominal pain. Negative for vomiting and diarrhea.  Genitourinary: Positive for vaginal  discharge. Negative for dysuria, urgency, frequency, hematuria, decreased urine volume, vaginal bleeding, enuresis and difficulty urinating.  Musculoskeletal: Negative for back pain.  All other systems reviewed and are negative.     Allergies  Review of patient's allergies indicates no known allergies.  Home Medications   Prior to Admission medications   Medication Sig Start Date End Date Taking? Authorizing Provider  furosemide (LASIX) 40 MG tablet Take 40 mg by mouth daily.   Yes Historical Provider, MD  hydrochlorothiazide (HYDRODIURIL) 25 MG tablet Take 25 mg by mouth daily.   Yes Historical Provider, MD  ibuprofen (ADVIL,MOTRIN) 200 MG tablet Take 800 mg by mouth every 6 (six) hours as needed for pain.   Yes Historical Provider, MD  lisinopril (PRINIVIL,ZESTRIL) 10 MG tablet Take 10 mg by mouth daily.   Yes Historical Provider, MD  naproxen sodium (ANAPROX) 220 MG tablet Take 880 mg by mouth daily as needed (pain).   Yes Historical Provider, MD  HYDROcodone-acetaminophen (NORCO) 5-325 MG per tablet Take 1-2 tablets by mouth every 4 (four) hours as needed. 11/03/14   Nat Christen, MD  methocarbamol (ROBAXIN) 500 MG tablet Take 2 tablets (1,000 mg total) by mouth 4 (four) times daily as needed (muscle spasm/pain). Patient not taking: Reported on 11/03/2014 06/05/13   Francine Graven, DO  naproxen (NAPROSYN) 250 MG tablet Take 1 tablet (250 mg total) by mouth 2 (two) times daily with  a meal. Patient not taking: Reported on 11/03/2014 06/05/13   Francine Graven, DO  oxyCODONE-acetaminophen (PERCOCET/ROXICET) 5-325 MG per tablet 1 or 2 tabs PO q6h prn pain Patient not taking: Reported on 11/03/2014 06/05/13   Francine Graven, DO   BP 159/83 mmHg  Pulse 62  Temp(Src) 97.6 F (36.4 C) (Oral)  Resp 14  Ht 5\' 3"  (1.6 m)  Wt 250 lb (113.399 kg)  BMI 44.30 kg/m2  SpO2 98% Physical Exam  Constitutional: She is oriented to person, place, and time. She appears well-developed and well-nourished.   Obese.  HENT:  Head: Normocephalic and atraumatic.  Eyes: Conjunctivae and EOM are normal. Pupils are equal, round, and reactive to light.  Neck: Normal range of motion. Neck supple.  Cardiovascular: Normal rate and regular rhythm.   Pulmonary/Chest: Effort normal and breath sounds normal.  Abdominal: Soft. Bowel sounds are normal. There is tenderness.  Minimal tenderness right mid abdomen.  Musculoskeletal: Normal range of motion.  Neurological: She is alert and oriented to person, place, and time.  Skin: Skin is warm and dry.  Psychiatric: She has a normal mood and affect. Her behavior is normal.  Nursing note and vitals reviewed.   ED Course  Procedures  DIAGNOSTIC STUDIES: Oxygen Saturation is 100% on room air, normal by my interpretation.    COORDINATION OF CARE: 11:54 AM-Discussed treatment plan which includes labs and CT Abdomen Pelvis with pt at bedside and pt agreed to plan.    Labs Review Labs Reviewed  CBC WITH DIFFERENTIAL/PLATELET - Abnormal; Notable for the following:    RBC 5.24 (*)    All other components within normal limits  COMPREHENSIVE METABOLIC PANEL - Abnormal; Notable for the following:    Potassium 3.2 (*)    Glucose, Bld 203 (*)    GFR calc non Af Amer 65 (*)    GFR calc Af Amer 76 (*)    All other components within normal limits  URINALYSIS, ROUTINE W REFLEX MICROSCOPIC - Abnormal; Notable for the following:    APPearance CLOUDY (*)    Specific Gravity, Urine >1.030 (*)    Glucose, UA 100 (*)    Hgb urine dipstick TRACE (*)    Protein, ur TRACE (*)    All other components within normal limits  URINE MICROSCOPIC-ADD ON - Abnormal; Notable for the following:    Squamous Epithelial / LPF FEW (*)    Bacteria, UA FEW (*)    All other components within normal limits  LIPASE, BLOOD    Imaging Review Ct Abdomen Pelvis W Contrast  11/03/2014   CLINICAL DATA:  54 year old female with subacute right abdominal and pelvic pain and nausea. Initial  encounter.  EXAM: CT ABDOMEN AND PELVIS WITH CONTRAST  TECHNIQUE: Multidetector CT imaging of the abdomen and pelvis was performed using the standard protocol following bolus administration of intravenous contrast.  CONTRAST:  157mL OMNIPAQUE IOHEXOL 300 MG/ML  SOLN  COMPARISON:  None.  FINDINGS: Lower chest:  Cardiomegaly identified.  Hepatobiliary: Fatty infiltration the liver is present without other hepatic abnormality. A gallbladder is unremarkable. There is no evidence of biliary dilatation.  Pancreas: Unremarkable  Spleen: Unremarkable  Adrenals/Urinary Tract: A 4 cm left renal cyst is identified. A nonobstructing 3 mm mid left renal calculus is present. There is no evidence of solid renal mass or hydronephrosis. No obstructing urinary calculi are identified. A 1 x 1.5 cm left adrenal adenoma is noted. The right adrenal gland is unremarkable. Bladder is within normal limits.  Stomach/Bowel: Descending  and sigmoid colonic diverticulosis noted without diverticulitis. The appendix is unremarkable. There is no evidence of bowel obstruction, pneumoperitoneum or abscess.  Vascular/Lymphatic: No enlarged lymph nodes or abdominal aortic aneurysm.  Reproductive: Uterus and adnexal regions are unremarkable.  Other: No free fluid.  Musculoskeletal: No acute or suspicious bony abnormalities identified. Degenerative changes within the lumbar spine are noted.  IMPRESSION: No evidence of acute abnormality.  Fatty infiltration of the liver.  Nonobstructing 3 mm left renal calculus.  Colonic diverticulosis without diverticulitis.   Electronically Signed   By: Margarette Canada M.D.   On: 11/03/2014 13:36     EKG Interpretation None      MDM   Final diagnoses:  RUQ pain    Patient is in no acute distress and hemodynamically stable. Vital signs are within normal limits. She is overweight. CT scan shows no acute abnormalities. Glucose noted to be slightly elevated at 203. Discharge medication Norco. Discussed findings  with patient. She will follow-up with her doctor  I personally performed the services described in this documentation, which was scribed in my presence. The recorded information has been reviewed and is accurate.    Nat Christen, MD 11/03/14 1438

## 2014-11-03 NOTE — ED Notes (Signed)
RLQ abdominal pain for several months. Pt reports pain more intense since Friday. Pt denies n/v/d.

## 2014-11-03 NOTE — Discharge Instructions (Signed)
Glucose was slightly elevated at 203. CT scan of abdomen and pelvis showed no acute findings. Medication for pain. Follow-up your primary care doctor.

## 2015-04-02 ENCOUNTER — Encounter (HOSPITAL_BASED_OUTPATIENT_CLINIC_OR_DEPARTMENT_OTHER): Payer: Self-pay | Admitting: Emergency Medicine

## 2015-04-02 ENCOUNTER — Emergency Department (HOSPITAL_BASED_OUTPATIENT_CLINIC_OR_DEPARTMENT_OTHER)
Admission: EM | Admit: 2015-04-02 | Discharge: 2015-04-02 | Disposition: A | Discharge status: Home / Self Care | Payer: Self-pay | Attending: Emergency Medicine | Admitting: Emergency Medicine

## 2015-04-02 DIAGNOSIS — Y9289 Other specified places as the place of occurrence of the external cause: Secondary | ICD-10-CM | POA: Insufficient documentation

## 2015-04-02 DIAGNOSIS — Y990 Civilian activity done for income or pay: Secondary | ICD-10-CM | POA: Insufficient documentation

## 2015-04-02 DIAGNOSIS — I10 Essential (primary) hypertension: Secondary | ICD-10-CM | POA: Insufficient documentation

## 2015-04-02 DIAGNOSIS — E119 Type 2 diabetes mellitus without complications: Secondary | ICD-10-CM | POA: Insufficient documentation

## 2015-04-02 DIAGNOSIS — G8929 Other chronic pain: Secondary | ICD-10-CM | POA: Insufficient documentation

## 2015-04-02 DIAGNOSIS — Z8669 Personal history of other diseases of the nervous system and sense organs: Secondary | ICD-10-CM | POA: Insufficient documentation

## 2015-04-02 DIAGNOSIS — Y9389 Activity, other specified: Secondary | ICD-10-CM | POA: Insufficient documentation

## 2015-04-02 DIAGNOSIS — Z79899 Other long term (current) drug therapy: Secondary | ICD-10-CM | POA: Insufficient documentation

## 2015-04-02 DIAGNOSIS — S8012XA Contusion of left lower leg, initial encounter: Secondary | ICD-10-CM | POA: Insufficient documentation

## 2015-04-02 DIAGNOSIS — S8992XA Unspecified injury of left lower leg, initial encounter: Secondary | ICD-10-CM

## 2015-04-02 DIAGNOSIS — W1849XA Other slipping, tripping and stumbling without falling, initial encounter: Secondary | ICD-10-CM | POA: Insufficient documentation

## 2015-04-02 MED ORDER — IBUPROFEN 400 MG PO TABS
600.0000 mg | ORAL_TABLET | Freq: Once | ORAL | Status: AC
  Administered 2015-04-02: 600 mg via ORAL
  Filled 2015-04-02 (×2): qty 1

## 2015-04-02 NOTE — Discharge Instructions (Signed)
RICE: Routine Care for Injuries The routine care of many injuries includes Rest, Ice, Compression, and Elevation (RICE). HOME CARE INSTRUCTIONS  Rest is needed to allow your body to heal. Routine activities can usually be resumed when comfortable. Injured tendons and bones can take up to 6 weeks to heal. Tendons are the cord-like structures that attach muscle to bone.  Ice following an injury helps keep the swelling down and reduces pain.  Put ice in a plastic bag.  Place a towel between your skin and the bag.  Leave the ice on for 15-20 minutes, 3-4 times a day, or as directed by your health care provider. Do this while awake, for the first 24 to 48 hours. After that, continue as directed by your caregiver.  Compression helps keep swelling down. It also gives support and helps with discomfort. If an elastic bandage has been applied, it should be removed and reapplied every 3 to 4 hours. It should not be applied tightly, but firmly enough to keep swelling down. Watch fingers or toes for swelling, bluish discoloration, coldness, numbness, or excessive pain. If any of these problems occur, remove the bandage and reapply loosely. Contact your caregiver if these problems continue.  Elevation helps reduce swelling and decreases pain. With extremities, such as the arms, hands, legs, and feet, the injured area should be placed near or above the level of the heart, if possible. SEEK IMMEDIATE MEDICAL CARE IF:  You have persistent pain and swelling.  You develop redness, numbness, or unexpected weakness.  Your symptoms are getting worse rather than improving after several days. These symptoms may indicate that further evaluation or further X-rays are needed. Sometimes, X-rays may not show a small broken bone (fracture) until 1 week or 10 days later. Make a follow-up appointment with your caregiver. Ask when your X-ray results will be ready. Make sure you get your X-ray results. Document Released:  12/19/2000 Document Revised: 09/11/2013 Document Reviewed: 02/05/2011 [employer] Patient Information 2015 [employer]. This information is not intended to replace advice given to you by your health care provider. Make sure you discuss any questions you have with your health care provider.  

## 2015-04-02 NOTE — ED Provider Notes (Signed)
CSN: [id number]     Arrival date & time 04/02/15  0912 History   First MD Initiated Contact with Patient 04/02/15 0932     Chief Complaint  Patient presents with  . Leg Injury     (Consider location/radiation/quality/duration/timing/severity/associated sxs/prior Treatment) HPI Comments: Pt. 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 She says that she was walking across the playground and wasn't paying attention and she suddenly kicked the low balance bar and tripped face forward over it. She says that she did not twist her ankle, hit her head, or hit her knee. She says that she only felt pain in the lower part of her right leg at the time, and that she did not have ankle pain, knee pain, head pain, Loss of consciousness, nausea, vomiting, or amnesia. She says that she had a small amount of bruising and some swelling to her lower leg / shin where she hit the balance bar. She has been able to bear weight on her leg and came to the ED. She tried to put ice directly on it, but this was too uncomfortable. Otherwise, she has not done much for the injury. She has no numbness or tingling of the extremity. The only pain is in her shin with dorsiflexion of her left foot.   The history is provided by the patient.    Past Medical History  Diagnosis Date  . Hypertension   . Diabetes mellitus   . Joint pain   . Edema leg   . Other malaise and fatigue   . Kidney stones   . Sinus bradycardia   . Sleep apnea   . Chronic back pain    Past Surgical History  Procedure Laterality Date  . Cesarean section      times 2  . Lithotripsy     No family history on file. History  Substance Use Topics  . Smoking status: Never Smoker   . Smokeless tobacco: Not on file  . Alcohol Use: No   OB History    No data available     Review of Systems  Constitutional: Negative.  Negative  for activity change.  HENT: Negative.  Negative for facial swelling.   Eyes: Negative.  Negative for pain and redness.  Respiratory: Negative.  Negative for cough and shortness of breath.   Cardiovascular: Negative.  Negative for chest pain, palpitations and leg swelling.  Gastrointestinal: Negative.  Negative for nausea, vomiting and abdominal pain.  Endocrine: Negative.   Genitourinary: Negative.   Musculoskeletal: Positive for arthralgias. Negative for myalgias, back pain, joint swelling, gait problem, neck pain and neck stiffness.  Skin: Negative.   Allergic/Immunologic: Negative.   Neurological: Negative.  Negative for weakness, light-headedness, numbness and headaches.  Hematological: Negative.   Psychiatric/Behavioral: Negative.       Allergies  Review of patient's allergies indicates no known allergies.  Home Medications   Prior to Admission medications   Medication Sig Start Date End Date Taking? Authorizing Provider  furosemide (LASIX) 40 MG tablet Take 40 mg by mouth daily.    Historical Provider, MD  hydrochlorothiazide (HYDRODIURIL) 25 MG tablet Take 25 mg by mouth daily.    Historical Provider, MD  HYDROcodone-acetaminophen (NORCO) 5-325 MG per tablet Take 1-2 tablets by mouth every 4 (four) hours as needed. 11/03/14   Nat Christen, MD  ibuprofen (ADVIL,MOTRIN)  200 MG tablet Take 800 mg by mouth every 6 (six) hours as needed for pain.    Historical Provider, MD  lisinopril (PRINIVIL,ZESTRIL) 10 MG tablet Take 10 mg by mouth daily.    Historical Provider, MD  methocarbamol (ROBAXIN) 500 MG tablet Take 2 tablets (1,000 mg total) by mouth 4 (four) times daily as needed (muscle spasm/pain). Patient not taking: Reported on 11/03/2014 06/05/13   Francine Graven, DO  naproxen (NAPROSYN) 250 MG tablet Take 1 tablet (250 mg total) by mouth 2 (two) times daily with a meal. Patient not taking: Reported on 11/03/2014 06/05/13   Francine Graven, DO  naproxen sodium (ANAPROX) 220 MG  tablet Take 880 mg by mouth daily as needed (pain).    Historical Provider, MD  oxyCODONE-acetaminophen (PERCOCET/ROXICET) 5-325 MG per tablet 1 or 2 tabs PO q6h prn pain Patient not taking: Reported on 11/03/2014 06/05/13   Francine Graven, DO   BP 155/74 mmHg  Pulse 64  Temp(Src) 98.5 F (36.9 C) (Oral)  Resp 18  Ht 5\' 3"  (1.6 m)  Wt 250 lb (113.399 kg)  BMI 44.30 kg/m2  SpO2 98% Physical Exam  Constitutional: She is oriented to person, place, and time. She appears well-developed and well-nourished. No distress.  HENT:  Head: Normocephalic and atraumatic.  Eyes: Conjunctivae and EOM are normal. Pupils are equal, round, and reactive to light.  Neck: Normal range of motion. Neck supple.  Cardiovascular: Normal rate, regular rhythm, normal heart sounds and intact distal pulses.  Exam reveals no gallop and no friction rub.   No murmur heard. Pulmonary/Chest: Effort normal and breath sounds normal. No respiratory distress. She has no wheezes. She has no rales. She exhibits no tenderness.  Abdominal: Soft. Bowel sounds are normal. She exhibits no mass. There is no tenderness. There is no guarding.  Musculoskeletal: She exhibits tenderness.       Right lower leg: She exhibits no tenderness, no bony tenderness, no swelling, no edema, no deformity and no laceration.       Left lower leg: She exhibits tenderness, bony tenderness and swelling. She exhibits no edema, no deformity and no laceration.       Legs: Neurological: She is alert and oriented to person, place, and time.  Skin: Skin is warm and dry. No rash noted. She is not diaphoretic.  Psychiatric: She has a normal mood and affect. Her behavior is normal.    ED Course  Procedures (including critical care time) Labs Review Labs Reviewed - No data to display  Imaging Review No results found.   EKG Interpretation None      MDM   Final diagnoses:  Lower extremity injury, left, initial encounter    Pt. Is a 54 y/o F here  with left lower leg pain after tripping over a balance bar at work. Mild injury to left lower leg with slight bruising, minimal swelling, and without gross deformity or evidence of fracture at this time. Bearing weight. Does not require x-ray. Will need RICE therapy to go home. Follow up with PCP as an outpatient. NSAID's for pain at home.     Aquilla Hacker, MD 04/02/15 [place], MD 04/02/15 [place], MD 04/02/15 1345

## 2015-04-02 NOTE — ED Notes (Signed)
Pt injured left lower leg.  Pt hit leg onto metal beam that was approximately one foot off ground.

## 2015-11-16 ENCOUNTER — Emergency Department (HOSPITAL_COMMUNITY)
Admission: EM | Admit: 2015-11-16 | Discharge: 2015-11-16 | Disposition: A | Discharge status: Home / Self Care | Payer: Self-pay | Attending: Emergency Medicine | Admitting: Emergency Medicine

## 2015-11-16 ENCOUNTER — Encounter (HOSPITAL_COMMUNITY): Payer: Self-pay | Admitting: *Deleted

## 2015-11-16 DIAGNOSIS — M545 Low back pain: Secondary | ICD-10-CM | POA: Insufficient documentation

## 2015-11-16 DIAGNOSIS — G8929 Other chronic pain: Secondary | ICD-10-CM | POA: Insufficient documentation

## 2015-11-16 DIAGNOSIS — E119 Type 2 diabetes mellitus without complications: Secondary | ICD-10-CM | POA: Insufficient documentation

## 2015-11-16 DIAGNOSIS — I10 Essential (primary) hypertension: Secondary | ICD-10-CM | POA: Insufficient documentation

## 2015-11-16 DIAGNOSIS — M5136 Other intervertebral disc degeneration, lumbar region: Secondary | ICD-10-CM | POA: Insufficient documentation

## 2015-11-16 DIAGNOSIS — Z79899 Other long term (current) drug therapy: Secondary | ICD-10-CM | POA: Insufficient documentation

## 2015-11-16 DIAGNOSIS — Z87442 Personal history of urinary calculi: Secondary | ICD-10-CM | POA: Insufficient documentation

## 2015-11-16 MED ORDER — ONDANSETRON HCL 4 MG PO TABS
4.0000 mg | ORAL_TABLET | Freq: Once | ORAL | Status: AC
Start: 2015-11-16 — End: 2015-11-16
  Administered 2015-11-16: 4 mg via ORAL
  Filled 2015-11-16: qty 1

## 2015-11-16 MED ORDER — CYCLOBENZAPRINE HCL 10 MG PO TABS
10.0000 mg | ORAL_TABLET | Freq: Two times a day (BID) | ORAL | Status: DC | PRN
Start: 2015-11-16 — End: 2017-06-02

## 2015-11-16 MED ORDER — DIAZEPAM 5 MG PO TABS
10.0000 mg | ORAL_TABLET | Freq: Once | ORAL | Status: AC
  Administered 2015-11-16: 10 mg via ORAL
  Filled 2015-11-16: qty 2

## 2015-11-16 MED ORDER — ACETAMINOPHEN-CODEINE #3 300-30 MG PO TABS
2.0000 | ORAL_TABLET | Freq: Once | ORAL | Status: AC
  Administered 2015-11-16: 2 via ORAL
  Filled 2015-11-16: qty 2

## 2015-11-16 MED ORDER — DEXAMETHASONE 4 MG PO TABS: 4.0000 mg | ORAL_TABLET | Freq: Two times a day (BID) | ORAL | Status: DC

## 2015-11-16 MED ORDER — PREDNISONE 50 MG PO TABS
60.0000 mg | ORAL_TABLET | Freq: Once | ORAL | Status: AC
  Administered 2015-11-16: 12:00:00 60 mg via ORAL
  Filled 2015-11-16: qty 1

## 2015-11-16 MED ORDER — ACETAMINOPHEN-CODEINE #3 300-30 MG PO TABS: 1.0000 | ORAL_TABLET | Freq: Four times a day (QID) | ORAL | Status: DC | PRN

## 2015-11-16 NOTE — ED Notes (Addendum)
Left lower back pain radiating down left leg. Pt states foot slipped off step a month ago and jerked her backwards which is when pain first began. Pt took BP med just prior to  Coming to ED.

## 2015-11-16 NOTE — ED Provider Notes (Signed)
CSN: [id number]     Arrival date & time 11/16/15  [id number] History  By signing my name below, I, Stephania Fragmin, attest that this documentation has been prepared under the direction and in the presence of Lily Kocher, PA-C. Electronically Signed: Stephania Fragmin, ED Scribe. 11/16/2015. 11:59 AM.      Chief Complaint  Patient presents with  . Back Pain   Patient is a 55 y.o. female presenting with back pain. The history is provided by the patient. No language interpreter was used.  Back Pain Location:  Lumbar spine Radiates to:  Does not radiate Pain severity:  Severe Onset quality:  Sudden Duration:  1 month Timing:  Constant Progression:  Waxing and waning Chronicity:  Recurrent Context comment:  Slipping on step Relieved by:  Nothing Worsened by:  Nothing tried Ineffective treatments:  None tried Associated symptoms: no bladder incontinence and no bowel incontinence   Risk factors: no recent surgery     HPI Comments: Kathryn Merritt is a 55 y.o. female with a history of hypertension, DM, joint pain, and chronic back pain, who presents to the Emergency Department complaining of constant, waxing and waning, 8/10, left lower back pain radiating down her left leg that began a month ago - patient states she had slipped on a porch step and jerked backwards when her pain began. She states her pain improved after that, but 2 weeks ago, she started to feel like an area was "bunched up" in her back, causing great discomfort. Patient also notes an occupational injury to her back that occurred in 2013. She had seen Dr. Valere Dross at [facility] and was told she had a "herniated or slipped disc." She was recommended surgery at that time but opted not to, after weighing the pros and cons of surgery. A CT scan done on 11/04/15 shows degenerative changes of the L spine, per medical records. Patient states she has no insurance, so she came to the ED "as a last resort." She denies bowel or bladder incontinence or  repeated falling.  PCP: [facility]  Past Medical History  Diagnosis Date  . Hypertension   . Diabetes mellitus   . Joint pain   . Edema leg   . Other malaise and fatigue   . Kidney stones   . Sinus bradycardia   . Sleep apnea   . Chronic back pain    Past Surgical History  Procedure Laterality Date  . Cesarean section      times 2  . Lithotripsy     No family history on file. Social History  Substance Use Topics  . Smoking status: Never Smoker   . Smokeless tobacco: None  . Alcohol Use: No   OB History    No data available     Review of Systems  Gastrointestinal: Negative for bowel incontinence.       Negative for bowel incontinence  Genitourinary: Negative for bladder incontinence.       Negative for bladder incontinence  Musculoskeletal: Positive for back pain (left lower back pain radiating down LLE).    Allergies  Review of patient's allergies indicates no known allergies.  Home Medications   Prior to Admission medications   Medication Sig Start Date End Date Taking? Authorizing Provider  ibuprofen (ADVIL,MOTRIN) 200 MG tablet Take 800 mg by mouth every 6 (six) hours as needed for pain.   Yes Historical Provider, MD  lisinopril-hydrochlorothiazide (PRINZIDE,ZESTORETIC) 20-12.5 MG tablet Take 1 tablet by mouth daily.   Yes Historical  Provider, MD   BP 195/91 mmHg  Pulse 60  Temp(Src) 98.4 F (36.9 C) (Oral)  Resp 16  Ht 5\' 3"  (1.6 m)  Wt 247 lb (112.038 kg)  BMI 43.76 kg/m2  SpO2 98% Physical Exam  Constitutional: She is oriented to person, place, and time. She appears well-developed and well-nourished. No distress.  HENT:  Head: Normocephalic and atraumatic.  Eyes: Conjunctivae and EOM are normal.  Neck: Neck supple. No tracheal deviation present.  Cardiovascular: Normal rate.   Pulmonary/Chest: Effort normal. No respiratory distress.  Lungs are clear to auscultation bilaterally.   Musculoskeletal: Normal range of motion.  There  is FROM of the cervical spine. There is no palpable step-off. There is tightness and tenseness of the upper trapezius bilaterally. No step-offs on the lumbar spine. No paraspinal lumbar tenderness to palpation.  Neurological: She is alert and oriented to person, place, and time.  No sensory deficits. No foot drop. Good balance. No motor deficits.   Skin: Skin is warm and dry.  Psychiatric: She has a normal mood and affect. Her behavior is normal.  Nursing note and vitals reviewed.   ED Course  Procedures (including critical care time)  DIAGNOSTIC STUDIES: Oxygen Saturation is 98% on RA, normal by my interpretation.    COORDINATION OF CARE: 11:57 AM - Discussed treatment plan with pt at bedside which includes discharge home with Rx muscle relaxant. Pt cautioned about sedating side effects of medication. Discussed with pt that emergency surgery is not warranted at this time, given HPI and physical exam. Advised pt to see PCP for referral to orthopedist, who is more equipped to address pt's back pain. Pt verbalized understanding and agreed to plan. She has no other questions at this time. Pt's husband states he is driving her home today.  MDM  No acute neurologic deficit appreciated this time. I have reviewed the x-rays and CTs from previous emergency department visits. Patient has degenerative disc disease changes, and osteoarthritis changes involving the lumbar spine. No acute problem noted today. The plan at this time is for the patient to rest her back is much as possible. To use heat to her back. Prescription for Flexeril, Tylenol codeine, and Decadron given to the patient. Patient is referred to Dr. Percell Miller for orthopedic evaluation concerning the back.    Final diagnoses:  DDD (degenerative disc disease), lumbar  Low back pain without sciatica, unspecified back pain laterality    **I have reviewed nursing notes, vital signs, and all appropriate lab and imaging results for this  patient.Lily Kocher, PA-C 11/16/15 1211  Nat Christen, MD 11/16/15 [phone]

## 2015-11-16 NOTE — ED Notes (Signed)
Pharmacy reports that pt has been taking 12-16 tablets OTC Ibuprofen and has had 8 already today

## 2015-11-16 NOTE — Discharge Instructions (Signed)
Heat to your lower back maybe helpful. Please rest your back is much as possible. Please see the orthopedic specialist concerning the degenerative changes in your lower back, and the occasional symptoms in your foot and leg. Use Flexeril 2 times daily for spasm. Use Tylenol codeine every 6 hours for pain not improved by Tylenol or ibuprofen. Please use Decadron 2 times daily with a meal until all taken. Degenerative Disk Disease Degenerative disk disease is a condition caused by the changes that occur in spinal disks as you grow older. Spinal disks are soft and compressible disks located between the bones of your spine (vertebrae). These disks act like shock absorbers. Degenerative disk disease can affect the whole spine. However, the neck and lower back are most commonly affected. Many changes can occur in the spinal disks with aging, such as:  The spinal disks may dry and shrink.  Small tears may occur in the tough, outer covering of the disk (annulus).  The disk space may become smaller due to loss of water.  Abnormal growths in the bone (spurs) may occur. This can put pressure on the nerve roots exiting the spinal canal, causing pain.  The spinal canal may become narrowed. RISK FACTORS   Being overweight.  Having a family history of degenerative disk disease.  Smoking.  There is increased risk if you are doing heavy lifting or have a sudden injury. SIGNS AND SYMPTOMS  Symptoms vary from person to person and may include:  Pain that varies in intensity. Some people have no pain, while others have severe pain. The location of the pain depends on the part of your backbone that is affected.  You will have neck or arm pain if a disk in the neck area is affected.  You will have pain in your back, buttocks, or legs if a disk in the lower back is affected.  Pain that becomes worse while bending, reaching up, or with twisting movements.  Pain that may start gradually and then get worse as  time passes. It may also start after a major or minor injury.  Numbness or tingling in the arms or legs. DIAGNOSIS  Your health care provider will ask you about your symptoms and about activities or habits that may cause the pain. He or she may also ask about any injuries, diseases, or treatments you have had. Your health care provider will examine you to check for the range of movement that is possible in the affected area, to check for strength in your extremities, and to check for sensation in the areas of the arms and legs supplied by different nerve roots. You may also have:   An X-ray of the spine.  Other imaging tests, such as MRI. TREATMENT  Your health care provider will advise you on the best plan for treatment. Treatment may include:  Medicines.  Rehabilitation exercises. HOME CARE INSTRUCTIONS   Follow proper lifting and walking techniques as advised by your health care provider.  Maintain good posture.  Exercise regularly as advised by your health care provider.  Perform relaxation exercises.  Change your sitting, standing, and sleeping habits as advised by your health care provider.  Change positions frequently.  Lose weight or maintain a healthy weight as advised by your health care provider.  Do not use any tobacco products, including cigarettes, chewing tobacco, or electronic cigarettes. If you need help quitting, ask your health care provider.  Wear supportive footwear.  Take medicines only as directed by your health care provider.  SEEK MEDICAL CARE IF:   Your pain does not go away within 1-4 weeks.  You have significant appetite or weight loss. SEEK IMMEDIATE MEDICAL CARE IF:   Your pain is severe.  You notice weakness in your arms, hands, or legs.  You begin to lose control of your bladder or bowel movements.  You have fevers or night sweats. MAKE SURE YOU:   Understand these instructions.  Will watch your condition.  Will get help right  away if you are not doing well or get worse.   This information is not intended to replace advice given to you by your health care provider. Make sure you discuss any questions you have with your health care provider.   Document Released: 07/04/2007 Document Revised: 09/27/2014 Document Reviewed: 01/08/2014 Elsevier Interactive Patient Education [employer].

## 2015-11-16 NOTE — ED Notes (Signed)
Pt with lower back pain that radiates down left leg, states her left foot feels like " a block of ice", hx of slipped disc in 2013

## 2017-06-02 ENCOUNTER — Encounter (HOSPITAL_COMMUNITY): Payer: Self-pay | Admitting: *Deleted

## 2017-06-02 ENCOUNTER — Emergency Department (HOSPITAL_COMMUNITY)
Admission: EM | Admit: 2017-06-02 | Discharge: 2017-06-02 | Disposition: A | Discharge status: Home / Self Care | Payer: Self-pay | Attending: Emergency Medicine | Admitting: Emergency Medicine

## 2017-06-02 ENCOUNTER — Emergency Department (HOSPITAL_COMMUNITY): Payer: Self-pay

## 2017-06-02 DIAGNOSIS — I10 Essential (primary) hypertension: Secondary | ICD-10-CM | POA: Insufficient documentation

## 2017-06-02 DIAGNOSIS — E119 Type 2 diabetes mellitus without complications: Secondary | ICD-10-CM | POA: Insufficient documentation

## 2017-06-02 DIAGNOSIS — M545 Low back pain, unspecified: Secondary | ICD-10-CM

## 2017-06-02 DIAGNOSIS — Z79899 Other long term (current) drug therapy: Secondary | ICD-10-CM | POA: Insufficient documentation

## 2017-06-02 DIAGNOSIS — G8929 Other chronic pain: Secondary | ICD-10-CM | POA: Insufficient documentation

## 2017-06-02 MED ORDER — ACETAMINOPHEN 325 MG PO TABS
650.0000 mg | ORAL_TABLET | Freq: Once | ORAL | Status: AC
  Administered 2017-06-02: 650 mg via ORAL
  Filled 2017-06-02: qty 2

## 2017-06-02 MED ORDER — PREDNISONE 20 MG PO TABS: 20.0000 mg | ORAL_TABLET | Freq: Every day | ORAL | 0 refills | Status: DC

## 2017-06-02 MED ORDER — IBUPROFEN 400 MG PO TABS
400.0000 mg | ORAL_TABLET | Freq: Once | ORAL | Status: AC
  Administered 2017-06-02: 400 mg via ORAL
  Filled 2017-06-02: qty 1

## 2017-06-02 MED ORDER — METHOCARBAMOL 500 MG PO TABS: 1000.0000 mg | ORAL_TABLET | Freq: Four times a day (QID) | ORAL | 0 refills | Status: DC | PRN

## 2017-06-02 NOTE — ED Triage Notes (Signed)
Pt c/o lower back pain that is worse on the right side than the left; pt denies any injury; pt denies any urinary sx

## 2017-06-02 NOTE — ED Provider Notes (Signed)
[facility] DEPT Provider Note   CSN: [id number] Arrival date & time: 06/02/17  1540     History   Chief Complaint Chief Complaint  Patient presents with  . Back Pain    HPI Kathryn Merritt is a 56 y.o. female.  HPI  Pt was seen at 0705. Per pt, c/o gradual onset and persistence of constant acute flair of her chronic low back "pain" for the past several days.  Denies any change in her usual chronic pain pattern for many years.  Pain worsens with palpation of the area and body position changes. Denies incont/retention of bowel or bladder, no saddle anesthesia, no focal motor weakness, no tingling/numbness in extremities, no fevers, no injury, no abd pain.   The symptoms have been associated with no other complaints. The patient has a significant history of similar symptoms previously, recently being evaluated for this complaint and multiple prior evals for same.   Formerly seen by Antionette Char Past Medical History:  Diagnosis Date  . Chronic back pain   . Diabetes mellitus   . Edema leg   . Hypertension   . Joint pain   . Kidney stones   . Other malaise and fatigue   . Sinus bradycardia   . Sleep apnea     Patient Active Problem List   Diagnosis Date Noted  . BRADYCARDIA 01/16/2010  . FATIGUE 01/16/2010  . EDEMA 01/16/2010    Past Surgical History:  Procedure Laterality Date  . CESAREAN SECTION     times 2  . LITHOTRIPSY      OB History    No data available       Home Medications    Prior to Admission medications   Medication Sig Start Date End Date Taking? Authorizing Provider  ibuprofen (ADVIL,MOTRIN) 200 MG tablet Take 800 mg by mouth every 6 (six) hours as needed for pain.    [provider]  lisinopril-hydrochlorothiazide (PRINZIDE,ZESTORETIC) 20-12.5 MG tablet Take 1 tablet by mouth daily.    [provider]    Family History History reviewed. No pertinent family history.  Social History Social History  Substance Use  Topics  . Smoking status: Never Smoker  . Smokeless tobacco: Never Used  . Alcohol use No     Allergies   Patient has no known allergies.   Review of Systems Review of Systems ROS: Statement: All systems negative except as marked or noted in the HPI; Constitutional: Negative for fever and chills. ; ; Eyes: Negative for eye pain, redness and discharge. ; ; ENMT: Negative for ear pain, hoarseness, nasal congestion, sinus pressure and sore throat. ; ; Cardiovascular: Negative for chest pain, palpitations, diaphoresis, dyspnea and peripheral edema. ; ; Respiratory: Negative for cough, wheezing and stridor. ; ; Gastrointestinal: Negative for nausea, vomiting, diarrhea, abdominal pain, blood in stool, hematemesis, jaundice and rectal bleeding. . ; ; Genitourinary: Negative for dysuria, flank pain and hematuria. ; ; Musculoskeletal: +LBP. Negative for neck pain. Negative for swelling and trauma.; ; Skin: Negative for pruritus, rash, abrasions, blisters, bruising and skin lesion.; ; Neuro: Negative for headache, lightheadedness and neck stiffness. Negative for weakness, altered level of consciousness, altered mental status, extremity weakness, paresthesias, involuntary movement, seizure and syncope.       Physical Exam Updated Vital Signs BP (!) 196/103 (BP Location: Left Arm)   Temp 98.4 F (36.9 C) (Oral)   Resp 18   Ht 5\' 3"  (1.6 m)   Wt 117.9 kg (260 lb)   SpO2 99%  BMI 46.06 kg/m   Physical Exam 0710: Physical examination:  Nursing notes reviewed; Vital signs and O2 SAT reviewed;  Constitutional: Well developed, Well nourished, Well hydrated, In no acute distress; Head:  Normocephalic, atraumatic; Eyes: EOMI, PERRL, No scleral icterus; ENMT: Mouth and pharynx normal, Mucous membranes moist; Neck: Supple, Full range of motion, No lymphadenopathy; Cardiovascular: Regular rate and rhythm, No gallop; Respiratory: Breath sounds clear & equal bilaterally, No wheezes.  Speaking full sentences  with ease, Normal respiratory effort/excursion; Chest: Nontender, Movement normal; Abdomen: Soft, Nontender, Nondistended, Normal bowel sounds; Genitourinary: No CVA tenderness; Spine:  No midline CS, TS, LS tenderness. +mild TTP right > left lumbar paraspinal muscles.;;  Extremities: Pulses normal, No tenderness, No edema, No calf edema or asymmetry.; Neuro: AA&Ox3, Major CN grossly intact.  Speech clear. No gross focal motor or sensory deficits in extremities. Climbs on and off stretcher easily by herself. Gait steady..; Skin: Color normal, Warm, Dry.   ED Treatments / Results  Labs (all labs ordered are listed, but only abnormal results are displayed)   EKG  EKG Interpretation None       Radiology    Procedures Procedures (including critical care time)  Medications Ordered in ED Medications  acetaminophen (TYLENOL) tablet 650 mg (650 mg Oral Given 06/02/17 0735)  ibuprofen (ADVIL,MOTRIN) tablet 400 mg (400 mg Oral Given 06/02/17 0735)     Initial Impression / Assessment and Plan / ED Course  I have reviewed the triage vital signs and the nursing notes.  Pertinent labs & imaging results that were available during my care of the patient were reviewed by me and considered in my medical decision making (see chart for details).  MDM Reviewed: previous chart, nursing note and vitals Interpretation: x-ray   Dg Lumbar Spine Complete Result Date: 06/02/2017 CLINICAL DATA:  Chronic low back pain EXAM: LUMBAR SPINE - COMPLETE 4+ VIEW COMPARISON:  06/05/2013 FINDINGS: Normal alignment. Negative for fracture. Mild disc degeneration L1-2 and L2-3. Moderate to advanced disc degeneration L3-4 left greater than right which has progressed significantly in the interval. Mild disc degeneration and spurring at L4-5. L5-S1 intact. Negative for pars defect. SI joints intact. IMPRESSION: No acute abnormality. Multilevel degenerative changes. Progression of disc degeneration at L3-4 left greater than  right. Electronically Signed   By: Franchot Gallo M.D.   On: 06/02/2017 07:35    0740:  XR as above. Long hx of chronic pain with multiple ED visits for same.  Pt endorses acute flair of her usual long standing chronic pain today, no change from her usual chronic pain pattern.  Pt encouraged to f/u with her PMD and Ortho MD for good continuity of care and control of her chronic pain.  Pt verb understanding.      Final Clinical Impressions(s) / ED Diagnoses   Final diagnoses:  None    New Prescriptions New Prescriptions   No medications on file     Francine Graven, DO 06/06/17 1452

## 2017-06-02 NOTE — Discharge Instructions (Signed)
Take the prescriptions as directed. Also take over the counter tylenol, as directed on packaging, as needed for discomfort.  Apply moist heat or ice to the area(s) of discomfort, for 15 minutes at a time, several times per day for the next few days.  Do not fall asleep on a heating or ice pack.  Call your regular medical doctor and your Orthopedic doctor today to schedule a follow up appointment within the next week.  Return to the Emergency Department immediately if worsening.

## 2017-06-02 NOTE — ED Notes (Signed)
Patient transported to X-ray 

## 2018-06-09 ENCOUNTER — Other Ambulatory Visit: Payer: Self-pay

## 2018-06-09 ENCOUNTER — Encounter (HOSPITAL_COMMUNITY): Payer: Self-pay | Admitting: *Deleted

## 2018-06-09 ENCOUNTER — Emergency Department (HOSPITAL_COMMUNITY)
Admission: EM | Admit: 2018-06-09 | Discharge: 2018-06-09 | Disposition: A | Discharge status: Home / Self Care | Payer: Self-pay | Attending: Emergency Medicine | Admitting: Emergency Medicine

## 2018-06-09 DIAGNOSIS — E1165 Type 2 diabetes mellitus with hyperglycemia: Secondary | ICD-10-CM | POA: Insufficient documentation

## 2018-06-09 DIAGNOSIS — R351 Nocturia: Secondary | ICD-10-CM | POA: Insufficient documentation

## 2018-06-09 DIAGNOSIS — I10 Essential (primary) hypertension: Secondary | ICD-10-CM | POA: Insufficient documentation

## 2018-06-09 DIAGNOSIS — R739 Hyperglycemia, unspecified: Secondary | ICD-10-CM

## 2018-06-09 LAB — COMPREHENSIVE METABOLIC PANEL
ALBUMIN: 4 g/dL (ref 3.5–5.0)
ALK PHOS: 87 U/L (ref 38–126)
ALT: 33 U/L (ref 0–44)
AST: 21 U/L (ref 15–41)
Anion gap: 8 (ref 5–15)
BILIRUBIN TOTAL: 0.9 mg/dL (ref 0.3–1.2)
BUN: 16 mg/dL (ref 6–20)
CALCIUM: 9.9 mg/dL (ref 8.9–10.3)
CO2: 28 mmol/L (ref 22–32)
Chloride: 101 mmol/L (ref 98–111)
Creatinine, Ser: 0.89 mg/dL (ref 0.44–1.00)
GLUCOSE: 529 mg/dL — AB (ref 70–99)
Potassium: 3.8 mmol/L (ref 3.5–5.1)
Sodium: 137 mmol/L (ref 135–145)
TOTAL PROTEIN: 7.7 g/dL (ref 6.5–8.1)

## 2018-06-09 LAB — URINALYSIS, ROUTINE W REFLEX MICROSCOPIC
BACTERIA UA: NONE SEEN
BILIRUBIN URINE: NEGATIVE
Glucose, UA: >=500 mg/dL — AB
Hgb urine dipstick: NEGATIVE
KETONES UR: NEGATIVE mg/dL
LEUKOCYTES UA: NEGATIVE
Nitrite: NEGATIVE
PH: 6 (ref 5.0–8.0)
PROTEIN: NEGATIVE mg/dL
Specific Gravity, Urine: 1.022 (ref 1.005–1.030)

## 2018-06-09 LAB — CBC WITH DIFFERENTIAL/PLATELET
BASOS ABS: 0.1 10*3/uL (ref 0.0–0.1)
BASOS PCT: 1 %
Eosinophils Absolute: 0.2 10*3/uL (ref 0.0–0.7)
Eosinophils Relative: 4 %
HEMATOCRIT: 49.7 % — AB (ref 36.0–46.0)
Hemoglobin: 16.5 g/dL — ABNORMAL HIGH (ref 12.0–15.0)
Lymphocytes Relative: 32 %
Lymphs Abs: 2 10*3/uL (ref 0.7–4.0)
MCH: 29.2 pg (ref 26.0–34.0)
MCHC: 33.2 g/dL (ref 30.0–36.0)
MCV: 87.8 fL (ref 78.0–100.0)
MONO ABS: 0.5 10*3/uL (ref 0.1–1.0)
Monocytes Relative: 8 %
Neutro Abs: 3.4 10*3/uL (ref 1.7–7.7)
Neutrophils Relative %: 55 %
Platelets: 229 10*3/uL (ref 150–400)
RBC: 5.66 MIL/uL — ABNORMAL HIGH (ref 3.87–5.11)
RDW: 13.7 % (ref 11.5–15.5)
WBC: 6.2 10*3/uL (ref 4.0–10.5)

## 2018-06-09 LAB — CBG MONITORING, ED
GLUCOSE-CAPILLARY: 319 mg/dL — AB (ref 70–99)
Glucose-Capillary: 450 mg/dL — ABNORMAL HIGH (ref 70–99)
Glucose-Capillary: 317 mg/dL — ABNORMAL HIGH (ref 70–99)

## 2018-06-09 MED ORDER — LISINOPRIL-HYDROCHLOROTHIAZIDE 20-12.5 MG PO TABS: 1.0000 | ORAL_TABLET | Freq: Every day | ORAL | 0 refills | Status: DC

## 2018-06-09 MED ORDER — SODIUM CHLORIDE 0.9 % IV BOLUS
500.0000 mL | Freq: Once | INTRAVENOUS | Status: AC
  Administered 2018-06-09: 500 mL via INTRAVENOUS

## 2018-06-09 MED ORDER — INSULIN ASPART 100 UNIT/ML IV SOLN
10.0000 [IU] | Freq: Once | INTRAVENOUS | Status: AC
  Administered 2018-06-09: 10 [IU] via INTRAVENOUS

## 2018-06-09 MED ORDER — LISINOPRIL 10 MG PO TABS
20.0000 mg | ORAL_TABLET | Freq: Once | ORAL | Status: AC
  Administered 2018-06-09: 20 mg via ORAL
  Filled 2018-06-09: qty 2

## 2018-06-09 MED ORDER — METFORMIN HCL ER (MOD) 1000 MG PO TB24: 1000.0000 mg | ORAL_TABLET | Freq: Every day | ORAL | 0 refills | Status: DC

## 2018-06-09 MED ORDER — HYDROCHLOROTHIAZIDE 25 MG PO TABS
25.0000 mg | ORAL_TABLET | Freq: Once | ORAL | Status: AC
  Administered 2018-06-09: 25 mg via ORAL
  Filled 2018-06-09: qty 1

## 2018-06-09 MED ORDER — METFORMIN HCL ER 500 MG PO TB24
1000.0000 mg | ORAL_TABLET | Freq: Once | ORAL | Status: AC
  Administered 2018-06-09: 1000 mg via ORAL
  Filled 2018-06-09: qty 2

## 2018-06-09 MED ORDER — INSULIN ASPART 100 UNIT/ML ~~LOC~~ SOLN
SUBCUTANEOUS | Status: AC
  Filled 2018-06-09: qty 1

## 2018-06-09 NOTE — ED Notes (Signed)
EDP at bedside  

## 2018-06-09 NOTE — ED Notes (Signed)
Pt given crackers and peanut butter.

## 2018-06-09 NOTE — ED Triage Notes (Signed)
Pt c/o high blood sugars for "awhile". Pt reports a few weeks ago she was started on Metformin by an ED doctor but it "didn't help and made me feel worse so honestly I stopped taking it". Pt reports her blood sugar was above 500 last night and above 300 this morning. Pt reports her blood pressure has also been elevated and she doesn't take medication for it. Pt denies having a PCP.

## 2018-06-09 NOTE — ED Notes (Signed)
Date and time results received: 06/09/18 0805  Test: Glucose  Critical Value: 529  Name of Provider Notified: Dr. Laverta Baltimore  Orders Received? Or Actions Taken?: See chart

## 2018-06-09 NOTE — Discharge Instructions (Signed)

## 2018-06-09 NOTE — ED Provider Notes (Signed)
Emergency Department Provider Note   I have reviewed the triage vital signs and the nursing notes.   HISTORY  Chief Complaint Hyperglycemia   HPI Kathryn Merritt is a 57 y.o. female with PMH of DM, HTN, and kidney stones presents to the emergency department for evaluation of elevated blood sugar and hypertension at home.  Patient states that she is very tired because she is not sleeping due to polyuria.  She denies any chest pain or shortness of breath.  She occasionally has right-sided abdominal discomfort but not currently.  She was prescribed metformin upon visiting in outside emergency department within the last 2 weeks but states this gave her significant nausea and other upset stomach symptoms so she stopped taking it.  Patient has also been off of her blood pressure medication, HCTZ/Lisinopril, for the last month.  Patient does not have a primary care physician.  He states in the past with her diabetes she had been on insulin.     Past Medical History:  Diagnosis Date  . Chronic back pain   . Diabetes mellitus   . Edema leg   . Hypertension   . Joint pain   . Kidney stones   . Other malaise and fatigue   . Sinus bradycardia   . Sleep apnea     Patient Active Problem List   Diagnosis Date Noted  . BRADYCARDIA 01/16/2010  . FATIGUE 01/16/2010  . EDEMA 01/16/2010    Past Surgical History:  Procedure Laterality Date  . CESAREAN SECTION     times 2  . LITHOTRIPSY      Allergies Patient has no known allergies.  No family history on file.  Social History Social History   Tobacco Use  . Smoking status: Never Smoker  . Smokeless tobacco: Never Used  Substance Use Topics  . Alcohol use: No  . Drug use: No    Review of Systems  Constitutional: No fever/chills. Positive generalized fatigue.  Eyes: No visual changes. ENT: No sore throat. Cardiovascular: Denies chest pain. Respiratory: Denies shortness of breath. Gastrointestinal: Intermittent right sided  abdominal pain. Positive nausea, no vomiting.  No diarrhea.  No constipation. Genitourinary: Negative for dysuria. Positive polyuria.  Musculoskeletal: Negative for back pain. Skin: Negative for rash. Neurological: Negative for headaches, focal weakness or numbness.  10-point ROS otherwise negative.  ____________________________________________   PHYSICAL EXAM:  VITAL SIGNS: ED Triage Vitals  Enc Vitals Group     BP 06/09/18 0727 (!) 167/93     Pulse Rate 06/09/18 0727 62     Resp 06/09/18 0727 18     Temp 06/09/18 0727 98.1 F (36.7 C)     Temp Source 06/09/18 0727 Oral     SpO2 06/09/18 0727 100 %     Weight 06/09/18 0724 240 lb (108.9 kg)     Height 06/09/18 0724 5\' 3"  (1.6 m)     Pain Score 06/09/18 0723 4   Constitutional: Alert and oriented. Well appearing and in no acute distress. Eyes: Conjunctivae are normal. Head: Atraumatic. Nose: No congestion/rhinnorhea. Mouth/Throat: Mucous membranes are moist. Neck: No stridor.  Cardiovascular: Normal rate, regular rhythm. Good peripheral circulation. Grossly normal heart sounds.   Respiratory: Normal respiratory effort.  No retractions. Lungs CTAB. Gastrointestinal: Soft and nontender. No distention.  Musculoskeletal: No lower extremity tenderness nor edema. No gross deformities of extremities. Neurologic:  Normal speech and language. No gross focal neurologic deficits are appreciated.  Skin:  Skin is warm, dry and intact. No rash noted.  ____________________________________________   LABS (all labs ordered are listed, but only abnormal results are displayed)  Labs Reviewed  COMPREHENSIVE METABOLIC PANEL - Abnormal; Notable for the following components:      Result Value   Glucose, Bld 529 (*)    All other components within normal limits  CBC WITH DIFFERENTIAL/PLATELET - Abnormal; Notable for the following components:   RBC 5.66 (*)    Hemoglobin 16.5 (*)    HCT 49.7 (*)    All other components within normal  limits  URINALYSIS, ROUTINE W REFLEX MICROSCOPIC - Abnormal; Notable for the following components:   Color, Urine STRAW (*)    APPearance HAZY (*)    Glucose, UA >=500 (*)    All other components within normal limits  CBG MONITORING, ED - Abnormal; Notable for the following components:   Glucose-Capillary 450 (*)    All other components within normal limits  CBG MONITORING, ED - Abnormal; Notable for the following components:   Glucose-Capillary 319 (*)    All other components within normal limits  CBG MONITORING, ED - Abnormal; Notable for the following components:   Glucose-Capillary 317 (*)    All other components within normal limits  URINE CULTURE  CBG MONITORING, ED   ____________________________________________  EKG   EKG Interpretation  Date/Time:  Friday June 09 2018 07:48:13 EDT Ventricular Rate:  57 PR Interval:    QRS Duration: 105 QT Interval:  628 QTC Calculation: 612 R Axis:   -10 Text Interpretation:  Sinus rhythm Borderline T abnormalities, inferior leads Prolonged QT interval No STEMI  Confirmed by Nanda Quinton [phone]) on 06/09/2018 7:52:53 AM       ____________________________________________  RADIOLOGY  None ____________________________________________   PROCEDURES  Procedure(s) performed:   Procedures  None ____________________________________________   INITIAL IMPRESSION / ASSESSMENT AND PLAN / ED COURSE  Pertinent labs & imaging results that were available during my care of the patient were reviewed by me and considered in my medical decision making (see chart for details).  Patient presents to the emergency department for evaluation of hyperglycemia and elevated blood pressures.  Patient is noncompliant with her medications due to lack of PCP follow-up.  Patient had been on metformin recently but stopped because of abdominal cramping and nausea.  She has hyperglycemia here as well as hypertension.  Plan for screening labs, fluids,  and reassess.  11:10 AM Patient's blood sugar is downtrending but remains elevated into the 300s.  No evidence of DKA.  Patient has been off of all medications over the last several weeks.  She endorses some abdominal cramping and nausea with her Metformin.  I discussed with her that this can be seen in people who take it on an empty stomach so I encouraged her to eat prior to taking her metformin.  I also plan to change her to the extended release formulation so that she will now take 1000 mg daily to try and prevent her mild intolerance symptoms.  Also called in prescriptions for her blood pressure medication.  I gave her contact information for local free clinic to obtain additional follow-up and management of her chronic conditions.   I have reviewed and discussed all results (EKG, imaging, lab, urine as appropriate), exam findings with patient. I have reviewed nursing notes and appropriate previous records.  I feel the patient is safe to be discharged home without further emergent workup. Discussed usual and customary return precautions. Patient and family (if present) verbalize understanding and are comfortable with this plan.  Patient will follow-up with their primary care provider. If they do not have a primary care provider, information for follow-up has been provided to them. All questions have been answered.  ____________________________________________  FINAL CLINICAL IMPRESSION(S) / ED DIAGNOSES  Final diagnoses:  Hyperglycemia  Essential hypertension     MEDICATIONS GIVEN DURING THIS VISIT:  Medications  sodium chloride 0.9 % bolus 500 mL ( Intravenous Stopped 06/09/18 0852)  insulin aspart (novoLOG) injection 10 Units (10 Units Intravenous Given 06/09/18 0830)  lisinopril (PRINIVIL,ZESTRIL) tablet 20 mg (20 mg Oral Given 06/09/18 0829)  hydrochlorothiazide (HYDRODIURIL) tablet 25 mg (25 mg Oral Given 06/09/18 0829)  metFORMIN (GLUCOPHAGE-XR) 24 hr tablet 1,000 mg (1,000 mg Oral  Given 06/09/18 1019)     NEW OUTPATIENT MEDICATIONS STARTED DURING THIS VISIT:  Discharge Medication List as of 06/09/2018 11:08 AM    START taking these medications   Details  metFORMIN (GLUMETZA) 1000 MG (MOD) 24 hr tablet Take 1 tablet (1,000 mg total) by mouth daily with breakfast., Starting Fri 06/09/2018, Normal        Note:  This document was prepared using Dragon voice recognition software and may include unintentional dictation errors.  Nanda Quinton, MD Emergency Medicine    Dquan Cortopassi, [facility], MD 06/09/18 [phone]

## 2018-06-11 LAB — URINE CULTURE: SPECIAL REQUESTS: NORMAL

## 2018-06-12 ENCOUNTER — Telehealth: Payer: Self-pay | Admitting: Emergency Medicine

## 2018-06-12 NOTE — Telephone Encounter (Signed)
Post ED Visit - Positive Culture Follow-up  Culture report reviewed by antimicrobial stewardship pharmacist:  []  Elenor Quinones, Pharm.D. []  Heide Guile, Pharm.D., BCPS AQ-ID []  Parks Neptune, Pharm.D., BCPS []  Alycia Rossetti, Pharm.D., BCPS []  [place], Pharm.D., BCPS, AAHIVP []  Legrand Como, Pharm.D., BCPS, AAHIVP []  Salome Arnt, PharmD, BCPS []  Johnnette Gourd, PharmD, BCPS [x]  Hughes Better, PharmD, BCPS []  Leeroy Cha, PharmD  Positive urine culture Treated with none, asymptomatic, organism sensitive to the same and no further patient follow-up is required at this time.  Hazle Nordmann 06/12/2018, 3:10 PM

## 2020-02-22 ENCOUNTER — Encounter (HOSPITAL_COMMUNITY): Payer: Self-pay

## 2020-02-22 ENCOUNTER — Emergency Department (HOSPITAL_COMMUNITY): Payer: Self-pay

## 2020-02-22 ENCOUNTER — Other Ambulatory Visit: Payer: Self-pay

## 2020-02-22 ENCOUNTER — Emergency Department (HOSPITAL_COMMUNITY)
Admission: EM | Admit: 2020-02-22 | Discharge: 2020-02-22 | Disposition: A | Discharge status: Home / Self Care | Payer: Self-pay | Attending: Emergency Medicine | Admitting: Emergency Medicine

## 2020-02-22 DIAGNOSIS — R112 Nausea with vomiting, unspecified: Secondary | ICD-10-CM | POA: Insufficient documentation

## 2020-02-22 DIAGNOSIS — Z7984 Long term (current) use of oral hypoglycemic drugs: Secondary | ICD-10-CM | POA: Insufficient documentation

## 2020-02-22 DIAGNOSIS — R1013 Epigastric pain: Secondary | ICD-10-CM | POA: Insufficient documentation

## 2020-02-22 DIAGNOSIS — R7989 Other specified abnormal findings of blood chemistry: Secondary | ICD-10-CM

## 2020-02-22 DIAGNOSIS — R739 Hyperglycemia, unspecified: Secondary | ICD-10-CM

## 2020-02-22 DIAGNOSIS — I10 Essential (primary) hypertension: Secondary | ICD-10-CM | POA: Insufficient documentation

## 2020-02-22 DIAGNOSIS — R1010 Upper abdominal pain, unspecified: Secondary | ICD-10-CM

## 2020-02-22 DIAGNOSIS — Z79899 Other long term (current) drug therapy: Secondary | ICD-10-CM | POA: Insufficient documentation

## 2020-02-22 DIAGNOSIS — E119 Type 2 diabetes mellitus without complications: Secondary | ICD-10-CM | POA: Insufficient documentation

## 2020-02-22 LAB — CBC
HCT: 45.3 % (ref 36.0–46.0)
Hemoglobin: 14.6 g/dL (ref 12.0–15.0)
MCH: 28.5 pg (ref 26.0–34.0)
MCHC: 32.2 g/dL (ref 30.0–36.0)
MCV: 88.5 fL (ref 80.0–100.0)
Platelets: 286 10*3/uL (ref 150–400)
RBC: 5.12 MIL/uL — ABNORMAL HIGH (ref 3.87–5.11)
RDW: 14.4 % (ref 11.5–15.5)
WBC: 8.3 10*3/uL (ref 4.0–10.5)
nRBC: 0 % (ref 0.0–0.2)

## 2020-02-22 LAB — HEPATIC FUNCTION PANEL
ALT: 54 U/L — ABNORMAL HIGH (ref 0–44)
AST: 86 U/L — ABNORMAL HIGH (ref 15–41)
Albumin: 3.8 g/dL (ref 3.5–5.0)
Alkaline Phosphatase: 87 U/L (ref 38–126)
Bilirubin, Direct: 0.5 mg/dL — ABNORMAL HIGH (ref 0.0–0.2)
Indirect Bilirubin: 0.8 mg/dL (ref 0.3–0.9)
Total Bilirubin: 1.3 mg/dL — ABNORMAL HIGH (ref 0.3–1.2)
Total Protein: 7.1 g/dL (ref 6.5–8.1)

## 2020-02-22 LAB — BASIC METABOLIC PANEL
Anion gap: 11 (ref 5–15)
BUN: 21 mg/dL — ABNORMAL HIGH (ref 6–20)
CO2: 27 mmol/L (ref 22–32)
Calcium: 10.1 mg/dL (ref 8.9–10.3)
Chloride: 101 mmol/L (ref 98–111)
Creatinine, Ser: 0.96 mg/dL (ref 0.44–1.00)
GFR calc Af Amer: >60 mL/min (ref >60)
GFR calc non Af Amer: >60 mL/min (ref >60)
Glucose, Bld: 253 mg/dL — ABNORMAL HIGH (ref 70–99)
Potassium: 3.5 mmol/L (ref 3.5–5.1)
Sodium: 139 mmol/L (ref 135–145)

## 2020-02-22 LAB — LIPASE, BLOOD: Lipase: 57 U/L — ABNORMAL HIGH (ref 11–51)

## 2020-02-22 LAB — TROPONIN I (HIGH SENSITIVITY)
Troponin I (High Sensitivity): 6 ng/L (ref <18)
Troponin I (High Sensitivity): 9 ng/L (ref <18)

## 2020-02-22 MED ORDER — FAMOTIDINE 20 MG PO TABS
20.0000 mg | ORAL_TABLET | Freq: Once | ORAL | Status: AC
  Administered 2020-02-22: 20 mg via ORAL
  Filled 2020-02-22: qty 1

## 2020-02-22 MED ORDER — HYDROMORPHONE HCL 1 MG/ML IJ SOLN
1.0000 mg | Freq: Once | INTRAMUSCULAR | Status: AC
  Administered 2020-02-22: 1 mg via INTRAVENOUS
  Filled 2020-02-22: qty 1

## 2020-02-22 MED ORDER — ONDANSETRON HCL 4 MG/2ML IJ SOLN
4.0000 mg | Freq: Once | INTRAMUSCULAR | Status: AC
  Administered 2020-02-22: 4 mg via INTRAVENOUS
  Filled 2020-02-22: qty 2

## 2020-02-22 MED ORDER — ALUM & MAG HYDROXIDE-SIMETH 200-200-20 MG/5ML PO SUSP
30.0000 mL | Freq: Once | ORAL | Status: AC
  Administered 2020-02-22: 30 mL via ORAL
  Filled 2020-02-22: qty 30

## 2020-02-22 MED ORDER — SODIUM CHLORIDE 0.9 % IV BOLUS
1000.0000 mL | Freq: Once | INTRAVENOUS | Status: AC
  Administered 2020-02-22: 1000 mL via INTRAVENOUS

## 2020-02-22 NOTE — ED Notes (Signed)
Assisted to bathroom. Pt reports she continues to feel sick to stomach and like she "needs to throw up, but nothing comes out". Provided emesis bag and repositioned. Husband at bedside. Offered warm blanked, but client declines. She reports feeling as if she is having hot flashes.

## 2020-02-22 NOTE — ED Provider Notes (Signed)
[facility] EMERGENCY DEPARTMENT Provider Note   CSN: [id number] Arrival date & time: 02/22/20  0857     History Chief Complaint  Patient presents with  . Abdominal Pain    Kathryn Merritt is a 59 y.o. female.  Patient c/o acute onset epigastric pain this AM. Symptoms acute onset, mod-severe, constant, dull, persistent, non radiating. Occurred at rest, no specific exacerbating or alleviating factors. Denies same pain in past, no recurrent similar pain. +nausea/vomiting x 1. No bloody or bilious emesis. No abd distension. Had bm this AM. No back or flank pain. Denies hx gallstones, pancreatitis, or pud. No hx cad or fam hx premature cad. No sob or unusual doe. No fever or chills.   The history is provided by the patient.  Chest Pain Associated symptoms: abdominal pain, nausea and vomiting   Associated symptoms: no back pain, no cough, no fever, no headache and no shortness of breath        Past Medical History:  Diagnosis Date  . Chronic back pain   . Diabetes mellitus   . Edema leg   . Hypertension   . Joint pain   . Kidney stones   . Other malaise and fatigue   . Sinus bradycardia   . Sleep apnea     Patient Active Problem List   Diagnosis Date Noted  . BRADYCARDIA 01/16/2010  . FATIGUE 01/16/2010  . EDEMA 01/16/2010    Past Surgical History:  Procedure Laterality Date  . CESAREAN SECTION     times 2  . LITHOTRIPSY       OB History   No obstetric history on file.     History reviewed. No pertinent family history.  Social History   Tobacco Use  . Smoking status: Never Smoker  . Smokeless tobacco: Never Used  Substance Use Topics  . Alcohol use: No  . Drug use: No    Home Medications Prior to Admission medications   Medication Sig Start Date End Date Taking? Authorizing Provider  ibuprofen (ADVIL,MOTRIN) 200 MG tablet Take 800 mg by mouth every 6 (six) hours as needed for pain.    [provider]  lisinopril-hydrochlorothiazide  (PRINZIDE,ZESTORETIC) 20-12.5 MG tablet Take 1 tablet by mouth daily. 06/09/18   Long, [facility], MD  metFORMIN (GLUMETZA) 1000 MG (MOD) 24 hr tablet Take 1 tablet (1,000 mg total) by mouth daily with breakfast. 06/09/18   Long, [facility], MD  methocarbamol (ROBAXIN) 500 MG tablet Take 2 tablets (1,000 mg total) by mouth 4 (four) times daily as needed for muscle spasms (muscle spasm/pain). Patient not taking: Reported on 06/09/2018 06/02/17   Francine Graven, DO  predniSONE (DELTASONE) 20 MG tablet Take 1 tablet (20 mg total) by mouth daily. Patient not taking: Reported on 06/09/2018 06/02/17   Francine Graven, DO    Allergies    Patient has no known allergies.  Review of Systems   Review of Systems  Constitutional: Negative for chills and fever.  HENT: Negative for sore throat.   Eyes: Negative for redness.  Respiratory: Negative for cough and shortness of breath.   Cardiovascular: Negative for chest pain.  Gastrointestinal: Positive for abdominal pain, nausea and vomiting. Negative for constipation.  Genitourinary: Negative for flank pain.  Musculoskeletal: Negative for back pain and neck pain.  Skin: Negative for rash.  Neurological: Negative for headaches.  Hematological: Does not bruise/bleed easily.  Psychiatric/Behavioral: Negative for confusion.    Physical Exam Updated Vital Signs BP (!) 171/88 (BP Location: Left Arm)  Pulse (!) 58   Temp 98.2 F (36.8 C) (Oral)   Resp 16   Ht 1.6 m (5\' 3" )   Wt 113.4 kg   SpO2 97%   BMI 44.29 kg/m   Physical Exam Vitals and nursing note reviewed.  Constitutional:      Appearance: Normal appearance. She is well-developed.  HENT:     Head: Atraumatic.     Nose: Nose normal.     Mouth/Throat:     Mouth: Mucous membranes are moist.  Eyes:     General: No scleral icterus.    Conjunctiva/sclera: Conjunctivae normal.  Neck:     Trachea: No tracheal deviation.  Cardiovascular:     Rate and Rhythm: Normal rate and regular rhythm.      Pulses: Normal pulses.     Heart sounds: Normal heart sounds. No murmur. No friction rub. No gallop.   Pulmonary:     Effort: Pulmonary effort is normal. No respiratory distress.     Breath sounds: Normal breath sounds.  Abdominal:     General: Bowel sounds are normal. There is no distension.     Palpations: Abdomen is soft. There is no mass.     Tenderness: There is no abdominal tenderness. There is no guarding or rebound.     Hernia: No hernia is present.  Genitourinary:    Comments: No cva tenderness.  Musculoskeletal:        General: No swelling or tenderness.     Cervical back: Normal range of motion and neck supple. No rigidity. No muscular tenderness.  Skin:    General: Skin is warm and dry.     Findings: No rash.  Neurological:     Mental Status: She is alert.     Comments: Alert, speech normal.   Psychiatric:        Mood and Affect: Mood normal.      ED Results / Procedures / Treatments   Labs (all labs ordered are listed, but only abnormal results are displayed) Results for orders placed or performed during the hospital encounter of 17/61/60  Basic metabolic panel  Result Value Ref Range   Sodium 139 135 - 145 mmol/L   Potassium 3.5 3.5 - 5.1 mmol/L   Chloride 101 98 - 111 mmol/L   CO2 27 22 - 32 mmol/L   Glucose, Bld 253 (H) 70 - 99 mg/dL   BUN 21 (H) 6 - 20 mg/dL   Creatinine, Ser 0.96 0.44 - 1.00 mg/dL   Calcium 10.1 8.9 - 10.3 mg/dL   GFR calc non Af Amer >60 >60 mL/min   GFR calc Af Amer >60 >60 mL/min   Anion gap 11 5 - 15  CBC  Result Value Ref Range   WBC 8.3 4.0 - 10.5 K/uL   RBC 5.12 (H) 3.87 - 5.11 MIL/uL   Hemoglobin 14.6 12.0 - 15.0 g/dL   HCT 45.3 36.0 - 46.0 %   MCV 88.5 80.0 - 100.0 fL   MCH 28.5 26.0 - 34.0 pg   MCHC 32.2 30.0 - 36.0 g/dL   RDW 14.4 11.5 - 15.5 %   Platelets 286 150 - 400 K/uL   nRBC 0.0 0.0 - 0.2 %  Hepatic function panel  Result Value Ref Range   Total Protein 7.1 6.5 - 8.1 g/dL   Albumin 3.8 3.5 - 5.0 g/dL    AST 86 (H) 15 - 41 U/L   ALT 54 (H) 0 - 44 U/L   Alkaline Phosphatase 87 38 -  126 U/L   Total Bilirubin 1.3 (H) 0.3 - 1.2 mg/dL   Bilirubin, Direct 0.5 (H) 0.0 - 0.2 mg/dL   Indirect Bilirubin 0.8 0.3 - 0.9 mg/dL  Lipase, blood  Result Value Ref Range   Lipase 57 (H) 11 - 51 U/L  Troponin I (High Sensitivity)  Result Value Ref Range   Troponin I (High Sensitivity) 6 <18 ng/L  Troponin I (High Sensitivity)  Result Value Ref Range   Troponin I (High Sensitivity) 9 <18 ng/L   DG Chest 2 View  Result Date: 02/22/2020 CLINICAL DATA:  Chest pain EXAM: CHEST - 2 VIEW COMPARISON:  None. FINDINGS: Low volume film. The lungs are clear without focal pneumonia, edema, pneumothorax or pleural effusion. There is pulmonary vascular congestion without overt pulmonary edema. Cardiopericardial silhouette is at upper limits of normal for size. The visualized bony structures of the thorax are intact. IMPRESSION: No active cardiopulmonary disease. Electronically Signed   By: Misty Stanley M.D.   On: 02/22/2020 09:45    EKG EKG Interpretation  Date/Time:  Friday February 22 2020 09:04:25 EDT Ventricular Rate:  60 PR Interval:  156 QRS Duration: 70 QT Interval:  420 QTC Calculation: 420 R Axis:   -4 Text Interpretation: Normal sinus rhythm Low voltage QRS No significant change since last tracing Confirmed by Lajean Saver [phone]) on 02/22/2020 9:14:28 AM   Radiology DG Chest 2 View  Result Date: 02/22/2020 CLINICAL DATA:  Chest pain EXAM: CHEST - 2 VIEW COMPARISON:  None. FINDINGS: Low volume film. The lungs are clear without focal pneumonia, edema, pneumothorax or pleural effusion. There is pulmonary vascular congestion without overt pulmonary edema. Cardiopericardial silhouette is at upper limits of normal for size. The visualized bony structures of the thorax are intact. IMPRESSION: No active cardiopulmonary disease. Electronically Signed   By: Misty Stanley M.D.   On: 02/22/2020 09:45     Procedures Procedures (including critical care time)  Medications Ordered in ED Medications  HYDROmorphone (DILAUDID) injection 1 mg (has no administration in time range)  ondansetron (ZOFRAN) injection 4 mg (has no administration in time range)  sodium chloride 0.9 % bolus 1,000 mL (has no administration in time range)    ED Course  I have reviewed the triage vital signs and the nursing notes.  Pertinent labs & imaging results that were available during my care of the patient were reviewed by me and considered in my medical decision making (see chart for details).    MDM Rules/Calculators/A&P                      Labs and imaging ordered. Ecg.   Reviewed nursing notes and prior charts for additional history.   MDM Number of Diagnoses or Management Options   Amount and/or Complexity of Data Reviewed Clinical lab tests: ordered and reviewed Tests in the radiology section of CPT: ordered Tests in the medicine section of CPT: ordered and reviewed Discussion of test results with the performing providers: yes Decide to obtain previous medical records or to obtain history from someone other than the patient: yes Obtain history from someone other than the patient: yes Review and summarize past medical records: yes Discuss the patient with other providers: yes Independent visualization of images, tracings, or specimens: yes  Risk of Complications, Morbidity, and/or Mortality Presenting problems: high Diagnostic procedures: high Management options: high   Dilaudid 1 mg iv. zofran iv.   Initial labs reviewed/interpreted by me - wbc normal, hgb normal.  CXR reviewed/interpreted  by me - no pna.   U/s pending.  Pain improved on recheck. No recurrent vomiting.   U/s reviewed/interpreted by me - no gallstones.   Additional labs reviewed/interpreted by me - trop normal, and delta trop also normal - felt not c/w acs.   Recheck pt, abd soft nt. No abd or chest pain. No  recurrent emesis, tolerating po.      Final Clinical Impression(s) / ED Diagnoses Final diagnoses:  None    Rx / DC Orders ED Discharge Orders    None       Lajean Saver, MD 02/22/20 1234

## 2020-02-22 NOTE — Discharge Instructions (Addendum)
It was our pleasure to provide your ER care today - we hope that you feel better.  Your ultrasound shows no gallstones. From today's labs, a few of your liver function tests are mildly elevated (AST 86, ALT 54, bilirubin 1.3) - for now, hold your zocor medication, avoid alcohol, and follow up with primary care doctor in the next couple week. Also, your blood sugar is high (253) - drink plenty of fluids/water, continue diabetes meds, follow diabetes eating plan, and follow up with your doctor for your blood sugar, liver tests, and blood pressure.   Return to ER if worse, new symptoms, worsening or severe abdominal pain, chest pain, trouble breathing, persistent vomiting, high fevers, or other concern.   You were given pain medication in the ER - no driving for the next 8 hours.

## 2020-02-22 NOTE — ED Notes (Signed)
Pt had a large amount of emesis in bag.

## 2020-02-22 NOTE — ED Triage Notes (Signed)
Pt is having severe pain in her chest and abdomen. Pain started this morning at 7:30 am. She vomited once this morning. Describes pain as constant. No relieving factors. Pt has a history of a leaking tricuspid valve.

## 2021-03-18 ENCOUNTER — Observation Stay (HOSPITAL_BASED_OUTPATIENT_CLINIC_OR_DEPARTMENT_OTHER): Payer: 59

## 2021-03-18 ENCOUNTER — Other Ambulatory Visit: Payer: Self-pay

## 2021-03-18 ENCOUNTER — Encounter (HOSPITAL_COMMUNITY)
Admission: EM | Disposition: A | Discharge status: Home / Self Care | Payer: Self-pay | Source: Home / Self Care | Attending: Emergency Medicine

## 2021-03-18 ENCOUNTER — Observation Stay (HOSPITAL_COMMUNITY): Payer: 59 | Admitting: Certified Registered Nurse Anesthetist

## 2021-03-18 ENCOUNTER — Observation Stay (HOSPITAL_COMMUNITY)
Admission: EM | Admit: 2021-03-18 | Discharge: 2021-03-19 | Disposition: A | Discharge status: Home / Self Care | Payer: 59 | Attending: Internal Medicine | Admitting: Internal Medicine

## 2021-03-18 ENCOUNTER — Encounter (HOSPITAL_COMMUNITY): Payer: Self-pay | Admitting: Emergency Medicine

## 2021-03-18 ENCOUNTER — Emergency Department (HOSPITAL_COMMUNITY): Payer: 59

## 2021-03-18 DIAGNOSIS — Z20822 Contact with and (suspected) exposure to covid-19: Secondary | ICD-10-CM | POA: Insufficient documentation

## 2021-03-18 DIAGNOSIS — I4891 Unspecified atrial fibrillation: Secondary | ICD-10-CM | POA: Diagnosis not present

## 2021-03-18 DIAGNOSIS — Z7984 Long term (current) use of oral hypoglycemic drugs: Secondary | ICD-10-CM | POA: Diagnosis not present

## 2021-03-18 DIAGNOSIS — R946 Abnormal results of thyroid function studies: Secondary | ICD-10-CM | POA: Insufficient documentation

## 2021-03-18 DIAGNOSIS — E785 Hyperlipidemia, unspecified: Secondary | ICD-10-CM | POA: Diagnosis not present

## 2021-03-18 DIAGNOSIS — R748 Abnormal levels of other serum enzymes: Secondary | ICD-10-CM | POA: Diagnosis not present

## 2021-03-18 DIAGNOSIS — R002 Palpitations: Secondary | ICD-10-CM | POA: Diagnosis present

## 2021-03-18 DIAGNOSIS — I4821 Permanent atrial fibrillation: Principal | ICD-10-CM | POA: Insufficient documentation

## 2021-03-18 DIAGNOSIS — I10 Essential (primary) hypertension: Secondary | ICD-10-CM | POA: Insufficient documentation

## 2021-03-18 DIAGNOSIS — Z79899 Other long term (current) drug therapy: Secondary | ICD-10-CM | POA: Insufficient documentation

## 2021-03-18 DIAGNOSIS — E1165 Type 2 diabetes mellitus with hyperglycemia: Secondary | ICD-10-CM

## 2021-03-18 DIAGNOSIS — R778 Other specified abnormalities of plasma proteins: Secondary | ICD-10-CM

## 2021-03-18 DIAGNOSIS — I480 Paroxysmal atrial fibrillation: Secondary | ICD-10-CM | POA: Diagnosis present

## 2021-03-18 DIAGNOSIS — E119 Type 2 diabetes mellitus without complications: Secondary | ICD-10-CM

## 2021-03-18 DIAGNOSIS — I160 Hypertensive urgency: Secondary | ICD-10-CM | POA: Diagnosis not present

## 2021-03-18 HISTORY — PX: CARDIOVERSION: SHX1299

## 2021-03-18 LAB — CBC WITH DIFFERENTIAL/PLATELET
Abs Immature Granulocytes: 0.03 10*3/uL (ref 0.00–0.07)
Basophils Absolute: 0.1 10*3/uL (ref 0.0–0.1)
Basophils Relative: 1 %
Eosinophils Absolute: 0.2 10*3/uL (ref 0.0–0.5)
Eosinophils Relative: 2 %
HCT: 52.6 % — ABNORMAL HIGH (ref 36.0–46.0)
Hemoglobin: 17 g/dL — ABNORMAL HIGH (ref 12.0–15.0)
Immature Granulocytes: 0 %
Lymphocytes Relative: 42 %
Lymphs Abs: 3.6 10*3/uL (ref 0.7–4.0)
MCH: 29.3 pg (ref 26.0–34.0)
MCHC: 32.3 g/dL (ref 30.0–36.0)
MCV: 90.5 fL (ref 80.0–100.0)
Monocytes Absolute: 0.7 10*3/uL (ref 0.1–1.0)
Monocytes Relative: 8 %
Neutro Abs: 4 10*3/uL (ref 1.7–7.7)
Neutrophils Relative %: 47 %
Platelets: 261 10*3/uL (ref 150–400)
RBC: 5.81 MIL/uL — ABNORMAL HIGH (ref 3.87–5.11)
RDW: 13.5 % (ref 11.5–15.5)
WBC: 8.5 10*3/uL (ref 4.0–10.5)
nRBC: 0 % (ref 0.0–0.2)

## 2021-03-18 LAB — COMPREHENSIVE METABOLIC PANEL
ALT: 40 U/L (ref 0–44)
AST: 34 U/L (ref 15–41)
Albumin: 3.9 g/dL (ref 3.5–5.0)
Alkaline Phosphatase: 76 U/L (ref 38–126)
Anion gap: 11 (ref 5–15)
BUN: 15 mg/dL (ref 6–20)
CO2: 23 mmol/L (ref 22–32)
Calcium: 9.5 mg/dL (ref 8.9–10.3)
Chloride: 105 mmol/L (ref 98–111)
Creatinine, Ser: 0.97 mg/dL (ref 0.44–1.00)
GFR, Estimated: >60 mL/min (ref >60)
Glucose, Bld: 396 mg/dL — ABNORMAL HIGH (ref 70–99)
Potassium: 4 mmol/L (ref 3.5–5.1)
Sodium: 139 mmol/L (ref 135–145)
Total Bilirubin: 0.9 mg/dL (ref 0.3–1.2)
Total Protein: 7.6 g/dL (ref 6.5–8.1)

## 2021-03-18 LAB — ECHOCARDIOGRAM COMPLETE
AR max vel: 2.22 cm2
AV Area VTI: 2.43 cm2
AV Area mean vel: 2.04 cm2
AV Mean grad: 7 mmHg
AV Peak grad: 14 mmHg
Ao pk vel: 1.87 m/s
Area-P 1/2: 2.88 cm2
Height: 63 in
MV VTI: 2.83 cm2
S' Lateral: 2.63 cm
Weight: 4007.08 oz

## 2021-03-18 LAB — TROPONIN I (HIGH SENSITIVITY)
Troponin I (High Sensitivity): 27 ng/L — ABNORMAL HIGH (ref <18)
Troponin I (High Sensitivity): 39 ng/L — ABNORMAL HIGH (ref <18)

## 2021-03-18 LAB — RESP PANEL BY RT-PCR (FLU A&B, COVID) ARPGX2
Influenza A by PCR: NEGATIVE
Influenza B by PCR: NEGATIVE
SARS Coronavirus 2 by RT PCR: NEGATIVE

## 2021-03-18 LAB — MRSA NEXT GEN BY PCR, NASAL: MRSA by PCR Next Gen: NOT DETECTED

## 2021-03-18 LAB — TSH: TSH: 6.135 u[IU]/mL — ABNORMAL HIGH (ref 0.350–4.500)

## 2021-03-18 LAB — GLUCOSE, CAPILLARY
Glucose-Capillary: 317 mg/dL — ABNORMAL HIGH (ref 70–99)
Glucose-Capillary: 397 mg/dL — ABNORMAL HIGH (ref 70–99)
Glucose-Capillary: 330 mg/dL — ABNORMAL HIGH (ref 70–99)

## 2021-03-18 LAB — T4, FREE: Free T4: 0.78 ng/dL (ref 0.61–1.12)

## 2021-03-18 SURGERY — CARDIOVERSION
Anesthesia: General

## 2021-03-18 MED ORDER — ONDANSETRON HCL 4 MG PO TABS: 4.0000 mg | ORAL_TABLET | Freq: Four times a day (QID) | ORAL | Status: DC | PRN

## 2021-03-18 MED ORDER — ORAL CARE MOUTH RINSE: 15.0000 mL | Freq: Once | OROMUCOSAL | Status: DC

## 2021-03-18 MED ORDER — LACTATED RINGERS IV SOLN: INTRAVENOUS | Status: DC

## 2021-03-18 MED ORDER — DILTIAZEM HCL-DEXTROSE 125-5 MG/125ML-% IV SOLN (PREMIX)
5.0000 mg/h | INTRAVENOUS | Status: DC
  Administered 2021-03-18: 5 mg/h via INTRAVENOUS
  Filled 2021-03-18: qty 125

## 2021-03-18 MED ORDER — ADENOSINE 6 MG/2ML IV SOLN
INTRAVENOUS | Status: AC
  Filled 2021-03-18: qty 6

## 2021-03-18 MED ORDER — TRAZODONE HCL 50 MG PO TABS
50.0000 mg | ORAL_TABLET | Freq: Every evening | ORAL | Status: DC | PRN
  Administered 2021-03-18: 50 mg via ORAL
  Filled 2021-03-18: qty 1

## 2021-03-18 MED ORDER — HEPARIN (PORCINE) 25000 UT/250ML-% IV SOLN
1000.0000 [IU]/h | INTRAVENOUS | Status: DC
  Administered 2021-03-18: 1000 [IU]/h via INTRAVENOUS
  Filled 2021-03-18 (×2): qty 250

## 2021-03-18 MED ORDER — CHLORHEXIDINE GLUCONATE CLOTH 2 % EX PADS
6.0000 | MEDICATED_PAD | Freq: Every day | CUTANEOUS | Status: DC
  Administered 2021-03-18 – 2021-03-19 (×2): 6 via TOPICAL

## 2021-03-18 MED ORDER — ACETAMINOPHEN 650 MG RE SUPP
650.0000 mg | Freq: Four times a day (QID) | RECTAL | Status: DC | PRN
Start: 2021-03-18 — End: 2021-03-19

## 2021-03-18 MED ORDER — METOPROLOL SUCCINATE ER 25 MG PO TB24
25.0000 mg | ORAL_TABLET | Freq: Every day | ORAL | Status: DC
  Administered 2021-03-18 – 2021-03-19 (×2): 25 mg via ORAL
  Filled 2021-03-18 (×2): qty 1

## 2021-03-18 MED ORDER — LISINOPRIL-HYDROCHLOROTHIAZIDE 20-12.5 MG PO TABS: 1.0000 | ORAL_TABLET | Freq: Every day | ORAL | Status: DC

## 2021-03-18 MED ORDER — INSULIN ASPART 100 UNIT/ML IJ SOLN
0.0000 [IU] | Freq: Every day | INTRAMUSCULAR | Status: DC
  Administered 2021-03-18: 4 [IU] via SUBCUTANEOUS

## 2021-03-18 MED ORDER — PANTOPRAZOLE SODIUM 40 MG PO TBEC
40.0000 mg | DELAYED_RELEASE_TABLET | Freq: Every day | ORAL | Status: DC
  Administered 2021-03-18 – 2021-03-19 (×2): 40 mg via ORAL
  Filled 2021-03-18 (×2): qty 1

## 2021-03-18 MED ORDER — INSULIN ASPART 100 UNIT/ML IJ SOLN
0.0000 [IU] | Freq: Three times a day (TID) | INTRAMUSCULAR | Status: DC
  Administered 2021-03-18: 15 [IU] via SUBCUTANEOUS
  Administered 2021-03-19: 8 [IU] via SUBCUTANEOUS

## 2021-03-18 MED ORDER — PROPOFOL 10 MG/ML IV BOLUS
INTRAVENOUS | Status: DC | PRN
  Administered 2021-03-18: 70 mg via INTRAVENOUS

## 2021-03-18 MED ORDER — SIMVASTATIN 20 MG PO TABS
10.0000 mg | ORAL_TABLET | Freq: Every day | ORAL | Status: DC
  Administered 2021-03-18: 10 mg via ORAL
  Filled 2021-03-18: qty 1

## 2021-03-18 MED ORDER — ALBUTEROL SULFATE HFA 108 (90 BASE) MCG/ACT IN AERS: 2.0000 | INHALATION_SPRAY | RESPIRATORY_TRACT | Status: DC | PRN

## 2021-03-18 MED ORDER — CHLORHEXIDINE GLUCONATE 0.12 % MT SOLN: 15.0000 mL | Freq: Once | OROMUCOSAL | Status: DC

## 2021-03-18 MED ORDER — ONDANSETRON HCL 4 MG/2ML IJ SOLN: 4.0000 mg | Freq: Four times a day (QID) | INTRAMUSCULAR | Status: DC | PRN

## 2021-03-18 MED ORDER — INSULIN DETEMIR 100 UNIT/ML ~~LOC~~ SOLN
10.0000 [IU] | Freq: Two times a day (BID) | SUBCUTANEOUS | Status: DC
  Administered 2021-03-18 – 2021-03-19 (×2): 10 [IU] via SUBCUTANEOUS
  Filled 2021-03-18 (×4): qty 0.1

## 2021-03-18 MED ORDER — DILTIAZEM LOAD VIA INFUSION
10.0000 mg | Freq: Once | INTRAVENOUS | Status: AC
  Administered 2021-03-18: 10 mg via INTRAVENOUS
  Filled 2021-03-18: qty 10

## 2021-03-18 MED ORDER — HEPARIN BOLUS VIA INFUSION
4000.0000 [IU] | Freq: Once | INTRAVENOUS | Status: AC
  Administered 2021-03-18: 4000 [IU] via INTRAVENOUS

## 2021-03-18 MED ORDER — LISINOPRIL 10 MG PO TABS
20.0000 mg | ORAL_TABLET | Freq: Every day | ORAL | Status: DC
  Administered 2021-03-19: 20 mg via ORAL
  Filled 2021-03-18: qty 2

## 2021-03-18 MED ORDER — HYDROCHLOROTHIAZIDE 12.5 MG PO CAPS
12.5000 mg | ORAL_CAPSULE | Freq: Every day | ORAL | Status: DC
  Administered 2021-03-19: 12.5 mg via ORAL
  Filled 2021-03-18: qty 1

## 2021-03-18 MED ORDER — ACETAMINOPHEN 325 MG PO TABS
650.0000 mg | ORAL_TABLET | Freq: Four times a day (QID) | ORAL | Status: DC | PRN
  Administered 2021-03-19: 650 mg via ORAL
  Filled 2021-03-18: qty 2

## 2021-03-18 MED ORDER — APIXABAN 5 MG PO TABS
5.0000 mg | ORAL_TABLET | Freq: Two times a day (BID) | ORAL | Status: DC
  Administered 2021-03-18 – 2021-03-19 (×3): 5 mg via ORAL
  Filled 2021-03-18 (×3): qty 1

## 2021-03-18 NOTE — CV Procedure (Signed)
Elective direct-current cardioversion  Indication: New onset persistent atrial fibrillation with RVR  Description of procedure: Informed consent was obtained and the patient was prepared in the PACU.  Timeout was performed.  Anterior and posterior pads were placed in standard fashion and connected to a biphasic defibrillator.  Deep sedation was achieved via use of propofol, please refer to Anesthesia records for dose administration and monitoring.  With sandbag on anterior chest pad, a single, synchronized 120 J shock was delivered with successful restoration of sinus rhythm.  Rhythm confirmed by follow-up ECG.  Patient remained hemodynamically stable throughout and there were no immediate complications.  Disposition: Patient will return to the ICU with plan to transition from IV heparin to Eliquis and from IV diltiazem to oral Toprol-XL.  Echocardiogram being obtained today.  Satira Sark, M.D., F.A.C.C.

## 2021-03-18 NOTE — ED Notes (Signed)
Attending in pt room assessing

## 2021-03-18 NOTE — ED Notes (Signed)
Date and time results received: 03/18/21 6:58 AM  Test: trop Critical Value: 27  Name of Provider Notified: Dr. Stark Jock  Orders Received? Or Actions Taken?: no new orders at this time

## 2021-03-18 NOTE — H&P (View-Only) (Signed)
Cardiology Consultation:   Patient ID: Kathryn BEBEE MRN: [id number]; DOB: [birth year]  Admit date: 03/18/2021 Date of Consult: 03/18/2021  PCP:  [facility] Cardiologist: Previously evaluated by Dr. Percival Spanish in 2013  Patient Profile:   Kathryn Merritt is a 60 y.o. female with a past medical history of HTN, Type 2 DM, HLD and GERD who is being seen 03/18/2021 for the evaluation of atrial fibrillation with RVR at the request of Dr. Carles Collet.  History of Present Illness:   Kathryn Merritt presented to [facility] ED on 03/18/2021 for evaluation of palpitations and dyspnea which started around 0200. She reports feeling more fatigued over the past several months but denies any recent chest pain or palpitations until last night. Says that around midnight she felt a pressure in her chest then around 0200 she had significant palpitations and felt like something was beating to get out of her chest. Reports associated dyspnea at that time. No nausea, vomiting or diaphoresis. She does report palpitations in the past but nothing within the past year until today. No recent orthopnea, PND or pitting edema. 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 No known history of CAD, CHF or cardiac arrhythmias. Previously had baseline bradycardia in 2013. No known family history of CAD or cardiac arrhythmias. She does consume 5-6 sodas a day along with occasional sweet tea but denies any alcohol use.   Initial labs show WBC 8.5, Hgb 17.0, platelets 261, Na+ 139, K+ 4.0 and creatinine 0.97. Initial Hs Troponin 6 with repeat values of 9, 27 and 39. TSH 6.135. Free T4 pending. COVID negative. EKG this admission shows what appears to be most consistent with atrial fibrillation/flutter, HR 160 with underlying RBBB. CXR showing atelectasis and mild left base infiltrate cannot be excluded.   She has been started on IV Cardizem along with IV Heparin. Rates were initially in the  160's to 170's, now in the 130's to 140's. She says her palpitations and dyspnea have improved but are still present.   Past Medical History:  Diagnosis Date   Chronic back pain    Diabetes mellitus    Edema leg    Hypertension    Joint pain    Kidney stones    Other malaise and fatigue    Sinus bradycardia    Sleep apnea     Past Surgical History:  Procedure Laterality Date   CESAREAN SECTION     times 2   LITHOTRIPSY       Home Medications:  Prior to Admission medications   Medication Sig Start Date End Date Taking? Authorizing Provider  amLODipine (NORVASC) 5 MG tablet Take 5 mg by mouth daily. 01/08/20  Yes [provider]  glimepiride (AMARYL) 2 MG tablet Take 2 mg by mouth 2 (two) times daily. 01/08/20  Yes [provider]  ibuprofen (ADVIL,MOTRIN) 200 MG tablet Take 800 mg by mouth every 6 (six) hours as needed for pain.   Yes [provider]  JANUMET 50-1000 MG tablet Take 1 tablet by mouth 2 (two) times daily. 01/08/20  Yes [provider]  lisinopril-hydrochlorothiazide (PRINZIDE,ZESTORETIC) 20-12.5 MG tablet Take 1 tablet by mouth daily. 06/09/18  Yes Long, Wonda Olds, MD  omeprazole (PRILOSEC) 20 MG capsule Take 20 mg by mouth daily. 02/14/20  Yes [provider]  albuterol (VENTOLIN HFA) 108 (90 Base) MCG/ACT inhaler Inhale 2 puffs into the lungs daily as needed. Patient not taking: Reported on  03/18/2021 01/08/20   [provider]  metFORMIN (GLUMETZA) 1000 MG (MOD) 24 hr tablet Take 1 tablet (1,000 mg total) by mouth daily with breakfast. Patient not taking: No sig reported 06/09/18   Long, Wonda Olds, MD  methocarbamol (ROBAXIN) 500 MG tablet Take 2 tablets (1,000 mg total) by mouth 4 (four) times daily as needed for muscle spasms (muscle spasm/pain). Patient not taking: No sig reported 06/02/17   Francine Graven, DO  predniSONE (DELTASONE) 20 MG tablet Take 1 tablet (20 mg total) by mouth daily. Patient not taking: No  sig reported 06/02/17   Francine Graven, DO  simvastatin (ZOCOR) 10 MG tablet Take 10 mg by mouth at bedtime. Patient not taking: Reported on 03/18/2021 01/10/20   [provider]  Vitamin D, Ergocalciferol, (DRISDOL) 1.25 MG (50000 UNIT) CAPS capsule Take 50,000 Units by mouth once a week. saturdays Patient not taking: Reported on 03/18/2021 01/22/20   [provider]    Inpatient Medications: Scheduled Meds:  Chlorhexidine Gluconate Cloth  6 each Topical Daily   insulin aspart  0-15 Units Subcutaneous TID WC   insulin aspart  0-5 Units Subcutaneous QHS   lisinopril-hydrochlorothiazide  1 tablet Oral Daily   pantoprazole  40 mg Oral Daily   simvastatin  10 mg Oral QHS   Continuous Infusions:  diltiazem (CARDIZEM) infusion 12.5 mg/hr (03/18/21 0741)   heparin 1,000 Units/hr (03/18/21 0826)   PRN Meds: acetaminophen **OR** acetaminophen, albuterol, ondansetron **OR** ondansetron (ZOFRAN) IV  Allergies:   No Known Allergies  Social History:   Social History   Socioeconomic History   Marital status: Married    Spouse name: Not on file   Number of children: Not on file   Years of education: Not on file   Highest education level: Not on file  Occupational History   Not on file  Tobacco Use   Smoking status: Never   Smokeless tobacco: Never  Substance and Sexual Activity   Alcohol use: No   Drug use: No   Sexual activity: Not on file  Other Topics Concern   Not on file  Social History Narrative   Not on file   Social Determinants of Health   Financial Resource Strain: Not on file  Food Insecurity: Not on file  Transportation Needs: Not on file  Physical Activity: Not on file  Stress: Not on file  Social Connections: Not on file  Intimate Partner Violence: Not on file    Family History:    Family History  Problem Relation Age of Onset   Hypertension Father     No known family history of CAD or cardiac arrhythmias.   ROS:  Please see the  history of present illness.   All other ROS reviewed and negative.     Physical Exam/Data:   Vitals:   03/18/21 0755 03/18/21 0800 03/18/21 0830 03/18/21 0915  BP:  (!) 188/138 (!) 148/103   Pulse: (!) 33 (!) 141 (!) 109   Resp: 17 19 19    Temp:      TempSrc:      SpO2: 96% 97% 96% 96%  Weight:    113.6 kg  Height:    5\' 3"  (1.6 m)   No intake or output data in the 24 hours ending 03/18/21 1029 Last 3 Weights 03/18/2021 03/18/2021 02/22/2020  Weight (lbs) 250 lb 7.1 oz 255 lb 250 lb  Weight (kg) 113.6 kg 115.667 kg 113.399 kg     Body mass index is 44.36 kg/m.  General:  Pleasant, obese female appearing in no acute distress. HEENT: normal Lymph: no adenopathy Neck: no JVD Endocrine:  No thryomegaly Vascular: No carotid bruits; FA pulses 2+ bilaterally without bruits  Cardiac:  normal S1, S2; Irregularly irregular, tachycardiac.  Lungs:  clear to auscultation bilaterally, no wheezing, rhonchi or rales  Abd: soft, nontender, no hepatomegaly  Ext: no edema Musculoskeletal:  No deformities, BUE and BLE strength normal and equal Skin: warm and dry  Neuro:  CNs 2-12 intact, no focal abnormalities noted Psych:  Normal affect   EKG:  The EKG was personally reviewed and demonstrates: Atrial fibrillation/flutter, HR 160 with underlying RBBB.   Relevant CV Studies:  Echocardiogram: 06/2012 Study Conclusions   - Left ventricle: The cavity size was normal. Wall thickness    was increased in a pattern of mild LVH. Systolic function    was normal. The estimated ejection fraction was in the    range of 60% to 65%.  - Aortic valve: Small gradient across valve but opens    normally on 2D Mean gradient: 7mm Hg (S). Peak gradient:    17mm Hg (S).  - Left atrium: The atrium was mildly dilated.  - Right atrium: The atrium was mildly dilated.  - Atrial septum: No defect or patent foramen ovale was    identified.  - Pulmonary arteries: PA peak pressure: 23mm Hg (S).  Laboratory  Data:  High Sensitivity Troponin:   Recent Labs  Lab 03/18/21 0600 03/18/21 0740  TROPONINIHS 27* 39*     Chemistry Recent Labs  Lab 03/18/21 0600  NA 139  K 4.0  CL 105  CO2 23  GLUCOSE 396*  BUN 15  CREATININE 0.97  CALCIUM 9.5  GFRNONAA >60  ANIONGAP 11    Recent Labs  Lab 03/18/21 0600  PROT 7.6  ALBUMIN 3.9  AST 34  ALT 40  ALKPHOS 76  BILITOT 0.9   Hematology Recent Labs  Lab 03/18/21 0600  WBC 8.5  RBC 5.81*  HGB 17.0*  HCT 52.6*  MCV 90.5  MCH 29.3  MCHC 32.3  RDW 13.5  PLT 261   BNPNo results for input(s): BNP, PROBNP in the last 168 hours.  DDimer No results for input(s): DDIMER in the last 168 hours.   Radiology/Studies:  DG Chest Portable 1 View  Result Date: 03/18/2021 CLINICAL DATA:  Shortness of breath. EXAM: PORTABLE CHEST 1 VIEW COMPARISON:  02/22/2020. FINDINGS: Heart size normal. Low lung volumes with bibasilar atelectasis. Mild left base infiltrate cannot be excluded. No pleural effusion or pneumothorax. IMPRESSION: Low lung volumes with bibasilar atelectasis. Mild left base infiltrate cannot be excluded. Electronically Signed   By: Marcello Moores  Register   On: 03/18/2021 06:15     Assessment and Plan:   1. Atrial Fibrillation with RVR - New diagnosis for the patient this admission and her story seems most consistent with the arrhythmia starting around 0200 today.  - Electrolytes WNL. Initial Hs Troponin 6 with repeat values of 9, 27 and 39. TSH 6.135 with Free T4 pending. Echocardiogram pending to assess LV function and wall motion.  - Reviewed with Dr. Domenic Polite and will plan for DCCV today without a TEE as she went into atrial fibrillation less than 12 hours ago by the story provided. Will see if this can be performed today. Continue IV Cardizem for rate-control. - Her CHA2DS2-VASc Score is at least 3. Currently on Heparin and would anticipate switching to Eliquis following her DCCV.   2. Hypertensive Urgency - BP initially elevated  to 198/152, improved to 148/103 on most recent check. She is currently on IV Cardizem and was on Amlodipine 5mg  daily and Lisinopril-HCTZ 20-12.5mg  daily prior to admission. Can restart Lisinopril-HCTZ. Hold off on restarting Amlodipine for now given the use of Cardizem.   3. Elevated Troponin Values - Initial Hs Troponin 6 with repeat values of 9, 27 and 39. Suspect secondary to demand ischemia in the setting of atrial fibrillation with RVR and hypertensive urgency. Echocardiogram pending to assess LV function and wall motion.   4. HLD - She has been continued on PTA Simvastatin 10mg  daily. Would consider switching to Crestor given the multiple drug interactions with Simvastatin.   5. Abnormal TSH - TSH at 6.135 and Free T4 pending.    Risk Assessment/Risk Scores:    CHA2DS2-VASc Score = 3  This indicates a 3.2% annual risk of stroke. The patient's score is based upon: CHF History: No HTN History: Yes Diabetes History: Yes Stroke History: No Vascular Disease History: No Age Score: 0 Gender Score: 1     For questions or updates, please contact [name] Please consult [email or web address] for contact info under    Signed, Erma Heritage, PA-C  03/18/2021 10:29 AM   Attending note:  Patient seen and examined.  I reviewed her records and discussed the case with Ms. Delano Metz, I agree with her above findings.  Ms. Netherland presents with newly documented atrial fibrillation with RVR, onset of symptoms in the early morning hours in terms of palpitations and chest discomfort.  In the last few months she has been fatigued but has not felt any specific sense of palpitations.  No prior documented cardiac arrhythmias, she was seen by Dr. Percival Spanish back in 2013 for assessment of bradycardia.  On examination this morning she remains in atrial fibrillation/flutter by telemetry with heart rates in the 130s to 150s on intravenous diltiazem and IV heparin.  She is afebrile, hypertensive.   Lungs are clear.  Cardiac exam reveals rapid irregularly irregular rhythm.  Pertinent lab work includes potassium 4.0, BUN 15, creatinine 0.97, AST and ALT normal, hemoglobin 17.0, platelets 261, TSH 6.135, glucose 396, high-sensitivity troponin I levels of 27 and 39.  SARS coronavirus 2 test negative.  I personally reviewed her ECG from 03/18/2021 which shows probable rapid atrial fibrillation with right bundle branch block, cannot exclude atypical atrial flutter.  Chest x-ray reports low lung volumes with bibasilar atelectasis, rule out left basilar infiltrate.  CHA2DS2-VASc score is at least 3.  Discussed situation with patient and her husband.  Plan is to proceed with cardioversion today in an attempt to restore sinus rhythm and then proceed with follow-up cardiac work-up including echocardiogram to exclude cardiomyopathy.  She does report history of untreated OSA which is certainly a factor in terms of recurrent atrial arrhythmias.  Present cardiac enzymes do not suggest ACS.  TSH is mildly increased at 6.13 with follow-up thyroid studies pending.  Plan to transition from heparin to [place].  Satira Sark, M.D., F.A.C.C.

## 2021-03-18 NOTE — Consult Note (Addendum)
Cardiology Consultation:   Patient ID: BRAYLIN FORMBY MRN: [id number]; DOB: 03-16-1961  Admit date: 03/18/2021 Date of Consult: 03/18/2021  PCP:  [facility] Cardiologist: Previously evaluated by Dr. Percival Spanish in 2013  Patient Profile:   Kathryn BARTLE is a 60 y.o. female with a past medical history of HTN, Type 2 DM, HLD and GERD who is being seen 03/18/2021 for the evaluation of atrial fibrillation with RVR at the request of Dr. Carles Collet.  History of Present Illness:   Kathryn Merritt presented to [facility] ED on 03/18/2021 for evaluation of palpitations and dyspnea which started around 0200. She reports feeling more fatigued over the past several months but denies any recent chest pain or palpitations until last night. Says that around midnight she felt a pressure in her chest then around 0200 she had significant palpitations and felt like something was beating to get out of her chest. Reports associated dyspnea at that time. No nausea, vomiting or diaphoresis. She does report palpitations in the past but nothing within the past year until today. No recent orthopnea, PND or pitting edema. 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 No known history of CAD, CHF or cardiac arrhythmias. Previously had baseline bradycardia in 2013. No known family history of CAD or cardiac arrhythmias. She does consume 5-6 sodas a day along with occasional sweet tea but denies any alcohol use.   Initial labs show WBC 8.5, Hgb 17.0, platelets 261, Na+ 139, K+ 4.0 and creatinine 0.97. Initial Hs Troponin 6 with repeat values of 9, 27 and 39. TSH 6.135. Free T4 pending. COVID negative. EKG this admission shows what appears to be most consistent with atrial fibrillation/flutter, HR 160 with underlying RBBB. CXR showing atelectasis and mild left base infiltrate cannot be excluded.   She has been started on IV Cardizem along with IV Heparin. Rates were initially in the  160's to 170's, now in the 130's to 140's. She says her palpitations and dyspnea have improved but are still present.   Past Medical History:  Diagnosis Date   Chronic back pain    Diabetes mellitus    Edema leg    Hypertension    Joint pain    Kidney stones    Other malaise and fatigue    Sinus bradycardia    Sleep apnea     Past Surgical History:  Procedure Laterality Date   CESAREAN SECTION     times 2   LITHOTRIPSY       Home Medications:  Prior to Admission medications   Medication Sig Start Date End Date Taking? Authorizing Provider  amLODipine (NORVASC) 5 MG tablet Take 5 mg by mouth daily. 01/08/20  Yes [provider]  glimepiride (AMARYL) 2 MG tablet Take 2 mg by mouth 2 (two) times daily. 01/08/20  Yes [provider]  ibuprofen (ADVIL,MOTRIN) 200 MG tablet Take 800 mg by mouth every 6 (six) hours as needed for pain.   Yes [provider]  JANUMET 50-1000 MG tablet Take 1 tablet by mouth 2 (two) times daily. 01/08/20  Yes [provider]  lisinopril-hydrochlorothiazide (PRINZIDE,ZESTORETIC) 20-12.5 MG tablet Take 1 tablet by mouth daily. 06/09/18  Yes Long, Wonda Olds, MD  omeprazole (PRILOSEC) 20 MG capsule Take 20 mg by mouth daily. 02/14/20  Yes [provider]  albuterol (VENTOLIN HFA) 108 (90 Base) MCG/ACT inhaler Inhale 2 puffs into the lungs daily as needed. Patient not taking: Reported on  03/18/2021 01/08/20   [provider]  metFORMIN (GLUMETZA) 1000 MG (MOD) 24 hr tablet Take 1 tablet (1,000 mg total) by mouth daily with breakfast. Patient not taking: No sig reported 06/09/18   Long, Wonda Olds, MD  methocarbamol (ROBAXIN) 500 MG tablet Take 2 tablets (1,000 mg total) by mouth 4 (four) times daily as needed for muscle spasms (muscle spasm/pain). Patient not taking: No sig reported 06/02/17   Francine Graven, DO  predniSONE (DELTASONE) 20 MG tablet Take 1 tablet (20 mg total) by mouth daily. Patient not taking: No  sig reported 06/02/17   Francine Graven, DO  simvastatin (ZOCOR) 10 MG tablet Take 10 mg by mouth at bedtime. Patient not taking: Reported on 03/18/2021 01/10/20   [provider]  Vitamin D, Ergocalciferol, (DRISDOL) 1.25 MG (50000 UNIT) CAPS capsule Take 50,000 Units by mouth once a week. saturdays Patient not taking: Reported on 03/18/2021 01/22/20   [provider]    Inpatient Medications: Scheduled Meds:  Chlorhexidine Gluconate Cloth  6 each Topical Daily   insulin aspart  0-15 Units Subcutaneous TID WC   insulin aspart  0-5 Units Subcutaneous QHS   lisinopril-hydrochlorothiazide  1 tablet Oral Daily   pantoprazole  40 mg Oral Daily   simvastatin  10 mg Oral QHS   Continuous Infusions:  diltiazem (CARDIZEM) infusion 12.5 mg/hr (03/18/21 0741)   heparin 1,000 Units/hr (03/18/21 0826)   PRN Meds: acetaminophen **OR** acetaminophen, albuterol, ondansetron **OR** ondansetron (ZOFRAN) IV  Allergies:   No Known Allergies  Social History:   Social History   Socioeconomic History   Marital status: Married    Spouse name: Not on file   Number of children: Not on file   Years of education: Not on file   Highest education level: Not on file  Occupational History   Not on file  Tobacco Use   Smoking status: Never   Smokeless tobacco: Never  Substance and Sexual Activity   Alcohol use: No   Drug use: No   Sexual activity: Not on file  Other Topics Concern   Not on file  Social History Narrative   Not on file   Social Determinants of Health   Financial Resource Strain: Not on file  Food Insecurity: Not on file  Transportation Needs: Not on file  Physical Activity: Not on file  Stress: Not on file  Social Connections: Not on file  Intimate Partner Violence: Not on file    Family History:    Family History  Problem Relation Age of Onset   Hypertension Father     No known family history of CAD or cardiac arrhythmias.   ROS:  Please see the  history of present illness.   All other ROS reviewed and negative.     Physical Exam/Data:   Vitals:   03/18/21 0755 03/18/21 0800 03/18/21 0830 03/18/21 0915  BP:  (!) 188/138 (!) 148/103   Pulse: (!) 33 (!) 141 (!) 109   Resp: 17 19 19    Temp:      TempSrc:      SpO2: 96% 97% 96% 96%  Weight:    113.6 kg  Height:    5\' 3"  (1.6 m)   No intake or output data in the 24 hours ending 03/18/21 1029 Last 3 Weights 03/18/2021 03/18/2021 02/22/2020  Weight (lbs) 250 lb 7.1 oz 255 lb 250 lb  Weight (kg) 113.6 kg 115.667 kg 113.399 kg     Body mass index is 44.36 kg/m.  General:  Pleasant, obese female appearing in no acute distress. HEENT: normal Lymph: no adenopathy Neck: no JVD Endocrine:  No thryomegaly Vascular: No carotid bruits; FA pulses 2+ bilaterally without bruits  Cardiac:  normal S1, S2; Irregularly irregular, tachycardiac.  Lungs:  clear to auscultation bilaterally, no wheezing, rhonchi or rales  Abd: soft, nontender, no hepatomegaly  Ext: no edema Musculoskeletal:  No deformities, BUE and BLE strength normal and equal Skin: warm and dry  Neuro:  CNs 2-12 intact, no focal abnormalities noted Psych:  Normal affect   EKG:  The EKG was personally reviewed and demonstrates: Atrial fibrillation/flutter, HR 160 with underlying RBBB.   Relevant CV Studies:  Echocardiogram: 06/2012 Study Conclusions   - Left ventricle: The cavity size was normal. Wall thickness    was increased in a pattern of mild LVH. Systolic function    was normal. The estimated ejection fraction was in the    range of 60% to 65%.  - Aortic valve: Small gradient across valve but opens    normally on 2D Mean gradient: 34mm Hg (S). Peak gradient:    71mm Hg (S).  - Left atrium: The atrium was mildly dilated.  - Right atrium: The atrium was mildly dilated.  - Atrial septum: No defect or patent foramen ovale was    identified.  - Pulmonary arteries: PA peak pressure: 83mm Hg (S).  Laboratory  Data:  High Sensitivity Troponin:   Recent Labs  Lab 03/18/21 0600 03/18/21 0740  TROPONINIHS 27* 39*     Chemistry Recent Labs  Lab 03/18/21 0600  NA 139  K 4.0  CL 105  CO2 23  GLUCOSE 396*  BUN 15  CREATININE 0.97  CALCIUM 9.5  GFRNONAA >60  ANIONGAP 11    Recent Labs  Lab 03/18/21 0600  PROT 7.6  ALBUMIN 3.9  AST 34  ALT 40  ALKPHOS 76  BILITOT 0.9   Hematology Recent Labs  Lab 03/18/21 0600  WBC 8.5  RBC 5.81*  HGB 17.0*  HCT 52.6*  MCV 90.5  MCH 29.3  MCHC 32.3  RDW 13.5  PLT 261   BNPNo results for input(s): BNP, PROBNP in the last 168 hours.  DDimer No results for input(s): DDIMER in the last 168 hours.   Radiology/Studies:  DG Chest Portable 1 View  Result Date: 03/18/2021 CLINICAL DATA:  Shortness of breath. EXAM: PORTABLE CHEST 1 VIEW COMPARISON:  02/22/2020. FINDINGS: Heart size normal. Low lung volumes with bibasilar atelectasis. Mild left base infiltrate cannot be excluded. No pleural effusion or pneumothorax. IMPRESSION: Low lung volumes with bibasilar atelectasis. Mild left base infiltrate cannot be excluded. Electronically Signed   By: Marcello Moores  Register   On: 03/18/2021 06:15     Assessment and Plan:   1. Atrial Fibrillation with RVR - New diagnosis for the patient this admission and her story seems most consistent with the arrhythmia starting around 0200 today.  - Electrolytes WNL. Initial Hs Troponin 6 with repeat values of 9, 27 and 39. TSH 6.135 with Free T4 pending. Echocardiogram pending to assess LV function and wall motion.  - Reviewed with Dr. Domenic Polite and will plan for DCCV today without a TEE as she went into atrial fibrillation less than 12 hours ago by the story provided. Will see if this can be performed today. Continue IV Cardizem for rate-control. - Her CHA2DS2-VASc Score is at least 3. Currently on Heparin and would anticipate switching to Eliquis following her DCCV.   2. Hypertensive Urgency - BP initially elevated  to 198/152, improved to 148/103 on most recent check. She is currently on IV Cardizem and was on Amlodipine 5mg  daily and Lisinopril-HCTZ 20-12.5mg  daily prior to admission. Can restart Lisinopril-HCTZ. Hold off on restarting Amlodipine for now given the use of Cardizem.   3. Elevated Troponin Values - Initial Hs Troponin 6 with repeat values of 9, 27 and 39. Suspect secondary to demand ischemia in the setting of atrial fibrillation with RVR and hypertensive urgency. Echocardiogram pending to assess LV function and wall motion.   4. HLD - She has been continued on PTA Simvastatin 10mg  daily. Would consider switching to Crestor given the multiple drug interactions with Simvastatin.   5. Abnormal TSH - TSH at 6.135 and Free T4 pending.    Risk Assessment/Risk Scores:    CHA2DS2-VASc Score = 3  This indicates a 3.2% annual risk of stroke. The patient's score is based upon: CHF History: No HTN History: Yes Diabetes History: Yes Stroke History: No Vascular Disease History: No Age Score: 0 Gender Score: 1     For questions or updates, please contact [place] Please consult [email or web address] for contact info under    Signed, Erma Heritage, PA-C  03/18/2021 10:29 AM   Attending note:  Patient seen and examined.  I reviewed her records and discussed the case with Kathryn Merritt, I agree with her above findings.  Kathryn Merritt presents with newly documented atrial fibrillation with RVR, onset of symptoms in the early morning hours in terms of palpitations and chest discomfort.  In the last few months she has been fatigued but has not felt any specific sense of palpitations.  No prior documented cardiac arrhythmias, she was seen by Dr. Percival Spanish back in 2013 for assessment of bradycardia.  On examination this morning she remains in atrial fibrillation/flutter by telemetry with heart rates in the 130s to 150s on intravenous diltiazem and IV heparin.  She is afebrile, hypertensive.   Lungs are clear.  Cardiac exam reveals rapid irregularly irregular rhythm.  Pertinent lab work includes potassium 4.0, BUN 15, creatinine 0.97, AST and ALT normal, hemoglobin 17.0, platelets 261, TSH 6.135, glucose 396, high-sensitivity troponin I levels of 27 and 39.  SARS coronavirus 2 test negative.  I personally reviewed her ECG from 03/18/2021 which shows probable rapid atrial fibrillation with right bundle branch block, cannot exclude atypical atrial flutter.  Chest x-ray reports low lung volumes with bibasilar atelectasis, rule out left basilar infiltrate.  CHA2DS2-VASc score is at least 3.  Discussed situation with patient and her husband.  Plan is to proceed with cardioversion today in an attempt to restore sinus rhythm and then proceed with follow-up cardiac work-up including echocardiogram to exclude cardiomyopathy.  She does report history of untreated OSA which is certainly a factor in terms of recurrent atrial arrhythmias.  Present cardiac enzymes do not suggest ACS.  TSH is mildly increased at 6.13 with follow-up thyroid studies pending.  Plan to transition from heparin to [place].  Satira Sark, M.D., F.A.C.C.

## 2021-03-18 NOTE — Progress Notes (Signed)
ANTICOAGULATION CONSULT NOTE -   Pharmacy Consult for Heparin-=> eliquis Indication: atrial fibrillation  No Known Allergies  Patient Measurements: Height: 5\' 3"  (160 cm) Weight: 113.6 kg (250 lb 7.1 oz) IBW/kg (Calculated) : 52.4 HEPARIN DW (KG): 79.9   Vital Signs: Temp: 98.6 F (37 C) (06/29 1145) Temp Source: Oral (06/29 1040) BP: 150/93 (06/29 1230) Pulse Rate: 71 (06/29 1230)  Labs: Recent Labs    03/18/21 0600 03/18/21 0740  HGB 17.0*  --   HCT 52.6*  --   PLT 261  --   CREATININE 0.97  --   TROPONINIHS 27* 39*     Estimated Creatinine Clearance: 76.7 mL/min (by C-G formula based on SCr of 0.97 mg/dL).   Medical History: Past Medical History:  Diagnosis Date   Chronic back pain    Diabetes mellitus    Edema leg    Hypertension    Joint pain    Kidney stones    Other malaise and fatigue    Sinus bradycardia    Sleep apnea     Medications:  See med rec  Assessment: Patient presented to ED with "heart racing and shortness of breath". She has new onset afib and elevated troponin. Not on any oral anticoagulants at home. Pharmacy asked to start heparin. 6/29 1300, patient cardio-converted=> now transitioning to oral tx with eliquis  Goal of Therapy:  Heparin level 0.3-0.7 units/ml Monitor platelets by anticoagulation protocol: Yes   Plan:  D/C heparin Eliquis 5mg  po BID Eliquis education Monitor for S/S of bleeding  Isac Sarna, BS Vena Austria, BCPS Clinical Pharmacist Pager [phone] 03/18/2021,12:58 PM

## 2021-03-18 NOTE — Progress Notes (Signed)
  Echocardiogram 2D Echocardiogram has been performed.  Kathryn Merritt 03/18/2021, 4:19 PM

## 2021-03-18 NOTE — Anesthesia Postprocedure Evaluation (Signed)
Anesthesia Post Note  Patient: Kathryn Merritt  Procedure(s) Performed: CARDIOVERSION  Patient location during evaluation: Phase II Anesthesia Type: General Level of consciousness: awake Pain management: pain level controlled Vital Signs Assessment: post-procedure vital signs reviewed and stable Respiratory status: spontaneous breathing and respiratory function stable Cardiovascular status: blood pressure returned to baseline and stable Postop Assessment: no headache and no apparent nausea or vomiting Anesthetic complications: no Comments: Late entry   No notable events documented.   Last Vitals:  Vitals:   03/18/21 1145 03/18/21 1200  BP: 137/89 131/81  Pulse: 76 74  Resp: 17 13  Temp: 37 C   SpO2: 97% 97%    Last Pain:  Vitals:   03/18/21 1145  TempSrc:   PainSc: 0-No pain                 Louann Sjogren

## 2021-03-18 NOTE — H&P (Signed)
History and Physical  Kathryn Merritt VQQ:[id number] DOB: [birth year] DOA: 03/18/2021   PCP: [facility]   Patient coming from: Home  Chief Complaint: sob, palpitations  HPI:  Kathryn Merritt is a 60 y.o. female with medical history of 60 year old female with a history of hypertension, diabetes mellitus type 2, hyperlipidemia presenting with palpitations that started around 2 AM on the morning of 03/18/2021 when she woke up to go to the bathroom.  Patient states that she has had transient palpitations in the past, but this has not happened for over 1 year.  She had some dizziness and felt fatigued this morning with associated palpitations.  The patient states that she has had some chest discomfort intermittently for many weeks.  She has attributed it to "heartburn".  She states that she normally takes some Zantac with some relief of her chest discomfort.  However it comes back when the Zantac wears off.  She denies any worsening shortness of breath, nausea, vomiting, diarrhea, abdominal, fever, chills, coughing, hemoptysis.  She denies any new medications.  In fact, the patient states that she has been trying to "stretch out" her home medications secondary to financial reasons. In the emergency department, the patient was afebrile hemodynamically stable with oxygen saturation 95-90% on room air.  BMP, LFTs, and CBC were essentially unremarkable.  Patient was noted to have atrial fibrillation with RVR.  EKG showed atrial fibrillation with right branch block.  Chest x-ray showed bibasilar atelectasis.  Troponin at 9>> 27 The patient was started on diltiazem drip.  Cardiology was consulted to assist with management.  Assessment/Plan: New onset Atrial fibrillation -continue diltiazem drip -CHADSVASc = 3 -start IV heparin -cardiology consult -echo -TSH  Transient RBBB -Echo  Diabetes mellitus type 2 -Hemoglobin A1c -Holding Amaryl and Janumet -NovoLog sliding  scale  Essential hypertension -Continue amlodipine and lisinopril  Hyperlipidemia -Continue simvastatin  Morbid Obesity -BMI 45.17 -lifestyle modification        Past Medical History:  Diagnosis Date   Chronic back pain    Diabetes mellitus    Edema leg    Hypertension    Joint pain    Kidney stones    Other malaise and fatigue    Sinus bradycardia    Sleep apnea    Past Surgical History:  Procedure Laterality Date   CESAREAN SECTION     times 2   LITHOTRIPSY     Social History:  reports that she has never smoked. She has never used smokeless tobacco. She reports that she does not drink alcohol and does not use drugs.   History reviewed. No pertinent family history.   No Known Allergies   Prior to Admission medications   Medication Sig Start Date End Date Taking? Authorizing Provider  amLODipine (NORVASC) 5 MG tablet Take 5 mg by mouth daily. 01/08/20  Yes [provider]  glimepiride (AMARYL) 2 MG tablet Take 2 mg by mouth 2 (two) times daily. 01/08/20  Yes [provider]  ibuprofen (ADVIL,MOTRIN) 200 MG tablet Take 800 mg by mouth every 6 (six) hours as needed for pain.   Yes [provider]  JANUMET 50-1000 MG tablet Take 1 tablet by mouth 2 (two) times daily. 01/08/20  Yes [provider]  lisinopril-hydrochlorothiazide (PRINZIDE,ZESTORETIC) 20-12.5 MG tablet Take 1 tablet by mouth daily. 06/09/18  Yes Long, Wonda Olds, MD  omeprazole (PRILOSEC) 20 MG capsule Take 20 mg by mouth daily. 02/14/20  Yes [provider]  albuterol (VENTOLIN HFA) 108 (90 Base) MCG/ACT inhaler Inhale 2 puffs into the lungs daily as needed. Patient not taking: Reported on 03/18/2021 01/08/20   [provider]  metFORMIN (GLUMETZA) 1000 MG (MOD) 24 hr tablet Take 1 tablet (1,000 mg total) by mouth daily with breakfast. Patient not taking: No sig reported 06/09/18   Long, Wonda Olds, MD  methocarbamol (ROBAXIN) 500 MG tablet Take 2 tablets  (1,000 mg total) by mouth 4 (four) times daily as needed for muscle spasms (muscle spasm/pain). Patient not taking: No sig reported 06/02/17   Francine Graven, DO  predniSONE (DELTASONE) 20 MG tablet Take 1 tablet (20 mg total) by mouth daily. Patient not taking: No sig reported 06/02/17   Francine Graven, DO  simvastatin (ZOCOR) 10 MG tablet Take 10 mg by mouth at bedtime. Patient not taking: Reported on 03/18/2021 01/10/20   [provider]  Vitamin D, Ergocalciferol, (DRISDOL) 1.25 MG (50000 UNIT) CAPS capsule Take 50,000 Units by mouth once a week. saturdays Patient not taking: Reported on 03/18/2021 01/22/20   [provider]    Review of Systems:  Constitutional:  No weight loss, night sweats, Fevers, chills, fatigue.  Head&Eyes: No headache.  No vision loss.  No eye pain or scotoma ENT:  No Difficulty swallowing,Tooth/dental problems,Sore throat,  No ear ache, post nasal drip,  Cardio-vascular:  No Orthopnea, PND, swelling in lower extremities,   GI:  No  abdominal pain, nausea, vomiting, diarrhea, loss of appetite, hematochezia, melena, heartburn, indigestion, Resp:  No shortness of breath with exertion or at rest. No cough. No coughing up of blood .No wheezing.No chest wall deformity  Skin:  no rash or lesions.  GU:  no dysuria, change in color of urine, no urgency or frequency. No flank pain.  Musculoskeletal:  No joint pain or swelling. No decreased range of motion. No back pain.  Psych:  No change in mood or affect. No depression or anxiety. Neurologic: No headache, no dysesthesia, no focal weakness, no vision loss. No syncope  Physical Exam: Vitals:   03/18/21 0630 03/18/21 0657 03/18/21 0730 03/18/21 0755  BP: (!) 157/118  (!) 177/152   Pulse: (!) 37 82 61 (!) 33  Resp: 13 (!) 29 19 17   Temp:      TempSrc:      SpO2: 97% 95% 97% 96%  Weight:      Height:       General:  A&O x 3, NAD, nontoxic, pleasant/cooperative Head/Eye: No conjunctival  hemorrhage, no icterus, [place]/AT, No nystagmus ENT:  No icterus,  No thrush, good dentition, no pharyngeal exudate Neck:  No masses, no lymphadenpathy, no bruits CV:  IRRR, no rub, no gallop, no S3 Lung:  CTAB, good air movement, no wheeze, no rhonchi Abdomen: soft/NT, +BS, nondistended, no peritoneal signs Ext: No cyanosis, No rashes, No petechiae, No lymphangitis, No edema Neuro: CNII-XII intact, strength 4/5 in bilateral upper and lower extremities, no dysmetria  Labs on Admission:  Basic Metabolic Panel: Recent Labs  Lab 03/18/21 0600  NA 139  K 4.0  CL 105  CO2 23  GLUCOSE 396*  BUN 15  CREATININE 0.97  CALCIUM 9.5   Liver Function Tests: Recent Labs  Lab 03/18/21 0600  AST 34  ALT 40  ALKPHOS 76  BILITOT 0.9  PROT 7.6  ALBUMIN 3.9   No results for input(s): LIPASE, AMYLASE in the last 168 hours. No results for input(s): AMMONIA in the last 168 hours. CBC: Recent Labs  Lab 03/18/21 0600  WBC 8.5  NEUTROABS 4.0  HGB 17.0*  HCT 52.6*  MCV 90.5  PLT 261   Coagulation Profile: No results for input(s): INR, PROTIME in the last 168 hours. Cardiac Enzymes: No results for input(s): CKTOTAL, CKMB, CKMBINDEX, TROPONINI in the last 168 hours. BNP: Invalid input(s): POCBNP CBG: No results for input(s): GLUCAP in the last 168 hours. Urine analysis:    Component Value Date/Time   COLORURINE STRAW (A) 06/09/2018 0738   APPEARANCEUR HAZY (A) 06/09/2018 0738   LABSPEC 1.022 06/09/2018 0738   PHURINE 6.0 06/09/2018 0738   GLUCOSEU >=500 (A) 06/09/2018 0738   HGBUR NEGATIVE 06/09/2018 0738   BILIRUBINUR NEGATIVE 06/09/2018 0738   KETONESUR NEGATIVE 06/09/2018 0738   PROTEINUR NEGATIVE 06/09/2018 0738   UROBILINOGEN 0.2 11/03/2014 1140   NITRITE NEGATIVE 06/09/2018 0738   LEUKOCYTESUR NEGATIVE 06/09/2018 0738   Sepsis Labs: @LABRCNTIP (procalcitonin:4,lacticidven:4) )No results found for this or any previous visit (from the past 240 hour(s)).   Radiological  Exams on Admission: DG Chest Portable 1 View  Result Date: 03/18/2021 CLINICAL DATA:  Shortness of breath. EXAM: PORTABLE CHEST 1 VIEW COMPARISON:  02/22/2020. FINDINGS: Heart size normal. Low lung volumes with bibasilar atelectasis. Mild left base infiltrate cannot be excluded. No pleural effusion or pneumothorax. IMPRESSION: Low lung volumes with bibasilar atelectasis. Mild left base infiltrate cannot be excluded. Electronically Signed   By: Marcello Moores  Register   On: 03/18/2021 06:15    EKG: Independently reviewed. Atrial fib, RVR, RBBB   Time spent:60 minutes Code Status:  FULL Family Communication:  No Family at bedside Disposition Plan: expect 1-2 day hospitalization Consults called: cards DVT Prophylaxis: IV heparin  Orson Eva, DO  [facility] Pager [phone]  If 7PM-7AM, please contact night-coverage [email or web address] Password [email or web address] 03/18/2021, 8:06 AM

## 2021-03-18 NOTE — ED Provider Notes (Signed)
[facility] EMERGENCY DEPARTMENT Provider Note   CSN: [id number] Arrival date & time: 03/18/21  0530     History Chief Complaint  Patient presents with   Tachycardia        Shortness of Breath    Kathryn Merritt is a 60 y.o. female.  Patient is a 60 year old female with history of hypertension, diabetes.  She presents today for evaluation of palpitations.  She woke from sleep at approximately 2 AM to use the bathroom.  As she was walking, she noticed her heart began to beat rapidly and irregularly.  She described it as as if "something were trying to get out of her chest".  She denies any chest pain, but does feel somewhat short of breath.  She denies any leg swelling.  She denies any cardiac history such as A. fib, SVT, or coronary artery disease.  There are no aggravating or alleviating factors.  The history is provided by the patient.      Past Medical History:  Diagnosis Date   Chronic back pain    Diabetes mellitus    Edema leg    Hypertension    Joint pain    Kidney stones    Other malaise and fatigue    Sinus bradycardia    Sleep apnea     Patient Active Problem List   Diagnosis Date Noted   BRADYCARDIA 01/16/2010   FATIGUE 01/16/2010   EDEMA 01/16/2010    Past Surgical History:  Procedure Laterality Date   CESAREAN SECTION     times 2   LITHOTRIPSY       OB History   No obstetric history on file.     History reviewed. No pertinent family history.  Social History   Tobacco Use   Smoking status: Never   Smokeless tobacco: Never  Substance Use Topics   Alcohol use: No   Drug use: No    Home Medications Prior to Admission medications   Medication Sig Start Date End Date Taking? Authorizing Provider  albuterol (VENTOLIN HFA) 108 (90 Base) MCG/ACT inhaler Inhale 2 puffs into the lungs daily as needed. 01/08/20   [provider]  amLODipine (NORVASC) 5 MG tablet Take 5 mg by mouth daily. 01/08/20   [provider]  glimepiride  (AMARYL) 2 MG tablet Take 2 mg by mouth 2 (two) times daily. 01/08/20   [provider]  ibuprofen (ADVIL,MOTRIN) 200 MG tablet Take 800 mg by mouth every 6 (six) hours as needed for pain.    [provider]  JANUMET 50-1000 MG tablet Take 1 tablet by mouth 2 (two) times daily. 01/08/20   [provider]  lisinopril-hydrochlorothiazide (PRINZIDE,ZESTORETIC) 20-12.5 MG tablet Take 1 tablet by mouth daily. 06/09/18   Long, Wonda Olds, MD  metFORMIN (GLUMETZA) 1000 MG (MOD) 24 hr tablet Take 1 tablet (1,000 mg total) by mouth daily with breakfast. Patient not taking: Reported on 02/22/2020 06/09/18   Long, Wonda Olds, MD  methocarbamol (ROBAXIN) 500 MG tablet Take 2 tablets (1,000 mg total) by mouth 4 (four) times daily as needed for muscle spasms (muscle spasm/pain). Patient not taking: Reported on 06/09/2018 06/02/17   Francine Graven, DO  omeprazole (PRILOSEC) 20 MG capsule Take 20 mg by mouth daily. 02/14/20   [provider]  predniSONE (DELTASONE) 20 MG tablet Take 1 tablet (20 mg total) by mouth daily. Patient not taking: Reported on 06/09/2018 06/02/17   Francine Graven, DO  simvastatin (ZOCOR) 10 MG tablet Take 10 mg by mouth  at bedtime. 01/10/20   [provider]  Vitamin D, Ergocalciferol, (DRISDOL) 1.25 MG (50000 UNIT) CAPS capsule Take 50,000 Units by mouth once a week. saturdays 01/22/20   [provider]    Allergies    Patient has no known allergies.  Review of Systems   Review of Systems  All other systems reviewed and are negative.  Physical Exam Updated Vital Signs BP (!) 198/152 (BP Location: Left Wrist)   Pulse 70   Temp 98 F (36.7 C) (Oral)   Resp (!) 21   Ht 5\' 3"  (1.6 m)   Wt 115.7 kg   SpO2 100%   BMI 45.17 kg/m   Physical Exam Vitals and nursing note reviewed.  Constitutional:      General: She is not in acute distress.    Appearance: She is well-developed. She is not diaphoretic.  HENT:     Head: Normocephalic  and atraumatic.  Cardiovascular:     Rate and Rhythm: Tachycardia present. Rhythm irregular.     Heart sounds: No murmur heard.   No friction rub. No gallop.  Pulmonary:     Effort: Pulmonary effort is normal. No respiratory distress.     Breath sounds: Normal breath sounds. No wheezing.  Abdominal:     General: Bowel sounds are normal. There is no distension.     Palpations: Abdomen is soft.     Tenderness: There is no abdominal tenderness.  Musculoskeletal:        General: Normal range of motion.     Cervical back: Normal range of motion and neck supple.     Right lower leg: No tenderness. No edema.     Left lower leg: No tenderness. No edema.  Skin:    General: Skin is warm and dry.  Neurological:     General: No focal deficit present.     Mental Status: She is alert and oriented to person, place, and time.    ED Results / Procedures / Treatments   Labs (all labs ordered are listed, but only abnormal results are displayed) Labs Reviewed  COMPREHENSIVE METABOLIC PANEL  CBC WITH DIFFERENTIAL/PLATELET  TROPONIN I (HIGH SENSITIVITY)    EKG EKG Interpretation  Date/Time:  Wednesday March 18 2021 05:41:34 EDT Ventricular Rate:  160 PR Interval:  148 QRS Duration: 126 QT Interval:  309 QTC Calculation: 505 R Axis:   211 Text Interpretation: Wide-QRS tachycardia Ventricular premature complex Right bundle branch block Confirmed by Veryl Speak [phone]) on 03/18/2021 5:54:33 AM  Radiology No results found.  Procedures Procedures   Medications Ordered in ED Medications  albuterol (VENTOLIN HFA) 108 (90 Base) MCG/ACT inhaler 2 puff (has no administration in time range)  diltiazem (CARDIZEM) 1 mg/mL load via infusion 10 mg (has no administration in time range)    And  diltiazem (CARDIZEM) 125 mg in dextrose 5% 125 mL (1 mg/mL) infusion (has no administration in time range)    ED Course  I have reviewed the triage vital signs and the nursing notes.  Pertinent labs &  imaging results that were available during my care of the patient were reviewed by me and considered in my medical decision making (see chart for details).    MDM Rules/Calculators/A&P  Patient presenting with palpitations and shortness of breath upon waking to go to the bathroom this morning.  Initial EKG reveals atrial fibrillation with rapid ventricular response.  Patient started on a Cardizem drip and laboratory studies initiated.  Laboratory studies show a mild elevation of  troponin, likely related to rate.  Patient not experiencing any chest pain.  She was started on a heparin drip.  Patient to be admitted to the hospitalist service for further care.  CRITICAL CARE Performed by: Veryl Speak Total critical care time: 40 minutes Critical care time was exclusive of separately billable procedures and treating other patients. Critical care was necessary to treat or prevent imminent or life-threatening deterioration. Critical care was time spent personally by me on the following activities: development of treatment plan with patient and/or surrogate as well as nursing, discussions with consultants, evaluation of patient's response to treatment, examination of patient, obtaining history from patient or surrogate, ordering and performing treatments and interventions, ordering and review of laboratory studies, ordering and review of radiographic studies, pulse oximetry and re-evaluation of patient's condition.   Final Clinical Impression(s) / ED Diagnoses Final diagnoses:  None    Rx / DC Orders ED Discharge Orders     None        Veryl Speak, MD 03/19/21 [phone]

## 2021-03-18 NOTE — Interval H&P Note (Signed)
History and Physical Interval Note:  03/18/2021 11:00 AM  Kathryn Merritt  has presented today for surgery, with the diagnosis of A-Fib.  The various methods of treatment have been discussed with the patient and family. After consideration of risks, benefits and other options for treatment, the patient has consented to  Procedure(s): CARDIOVERSION (N/A) as a surgical intervention.  The patient's history has been reviewed, patient examined, no change in status, stable for surgery.  I have reviewed the patient's chart and labs.  Questions were answered to the patient's satisfaction.     Rozann Lesches

## 2021-03-18 NOTE — Progress Notes (Signed)
ANTICOAGULATION CONSULT NOTE - Initial Consult  Pharmacy Consult for Heparin Indication: atrial fibrillation  No Known Allergies  Patient Measurements: Height: 5\' 3"  (160 cm) Weight: 115.7 kg (255 lb) IBW/kg (Calculated) : 52.4 HEPARIN DW (KG): 80.6   Vital Signs: Temp: 98 F (36.7 C) (06/29 0545) Temp Source: Oral (06/29 0545) BP: 177/152 (06/29 0730) Pulse Rate: 61 (06/29 0730)  Labs: Recent Labs    03/18/21 0600  HGB 17.0*  HCT 52.6*  PLT 261  CREATININE 0.97  TROPONINIHS 27*    Estimated Creatinine Clearance: 77.5 mL/min (by C-G formula based on SCr of 0.97 mg/dL).   Medical History: Past Medical History:  Diagnosis Date   Chronic back pain    Diabetes mellitus    Edema leg    Hypertension    Joint pain    Kidney stones    Other malaise and fatigue    Sinus bradycardia    Sleep apnea     Medications:  See med rec  Assessment: Patient presented to ED with "heart racing and shortness of breath". She has new onset afib and elevated troponin. Not on any oral anticoagulants at home. Pharmacy asked to start heparin  Goal of Therapy:  Heparin level 0.3-0.7 units/ml Monitor platelets by anticoagulation protocol: Yes   Plan:  Give 4000 units bolus x 1 Start heparin infusion at 1000 units/hr Check anti-Xa level in ~6 hours and daily while on heparin Continue to monitor H&H and platelets  Isac Sarna, BS Vena Austria, BCPS Clinical Pharmacist Pager [phone] 03/18/2021,7:37 AM

## 2021-03-18 NOTE — Anesthesia Preprocedure Evaluation (Signed)
Anesthesia Evaluation  Patient identified by MRN, date of birth, ID band Patient awake    Reviewed: Allergy & Precautions, H&P , NPO status , Patient's Chart, lab work & pertinent test results, reviewed documented beta blocker date and time   Airway Mallampati: II  TM Distance: >3 FB Neck ROM: full    Dental no notable dental hx.    Pulmonary neg pulmonary ROS,    Pulmonary exam normal breath sounds clear to auscultation       Cardiovascular Exercise Tolerance: Good hypertension, negative cardio ROS   Rhythm:irregular Rate:Tachycardia     Neuro/Psych negative neurological ROS  negative psych ROS   GI/Hepatic negative GI ROS, Neg liver ROS,   Endo/Other  diabetesMorbid obesity  Renal/GU Renal disease  negative genitourinary   Musculoskeletal   Abdominal   Peds  Hematology negative hematology ROS (+)   Anesthesia Other Findings   Reproductive/Obstetrics negative OB ROS                             Anesthesia Physical Anesthesia Plan  ASA: 3 and emergent  Anesthesia Plan: General   Post-op Pain Management:    Induction:   PONV Risk Score and Plan: Propofol infusion  Airway Management Planned:   Additional Equipment:   Intra-op Plan:   Post-operative Plan:   Informed Consent: I have reviewed the patients History and Physical, chart, labs and discussed the procedure including the risks, benefits and alternatives for the proposed anesthesia with the patient or authorized representative who has indicated his/her understanding and acceptance.     Dental Advisory Given  Plan Discussed with: CRNA  Anesthesia Plan Comments:         Anesthesia Quick Evaluation

## 2021-03-18 NOTE — ED Triage Notes (Signed)
Pt states her heart is racing and she is short of breath. States it started around 2 am. Pt tearful in triage and states she is "so tired".

## 2021-03-18 NOTE — Progress Notes (Signed)
Inpatient Diabetes Program Recommendations  AACE/ADA: New Consensus Statement on Inpatient Glycemic Control (2015)  Target Ranges:  Prepandial:   less than 140 mg/dL      Peak postprandial:   less than 180 mg/dL (1-2 hours)      Critically ill patients:  140 - 180 mg/dL   Lab Results  Component Value Date   GLUCAP 317 (H) 03/18/2021    Review of Glycemic Control Results for DALIYAH, SRAMEK (MRN [id number]) as of 03/18/2021 11:13  Ref. Range 03/18/2021 10:45  Glucose-Capillary Latest Ref Range: 70 - 99 mg/dL 317 (H)   Diabetes history: DM 2 Outpatient Diabetes medications:  Amaryl 2 mg bid, Janumet 50-1000 mg bid Current orders for Inpatient glycemic control:  Novolog moderate tid with meals and HS  Inpatient Diabetes Program Recommendations:    A1C pending.  Consider adding basal insulin while in the hospital.  Consider adding Levemir 10 units bid.  May need insulin at d/c as well if A1C>10%?  Will follow.   Thanks,  Adah Perl, RN, BC-ADM Inpatient Diabetes Coordinator Pager [phone] (8a-5p)

## 2021-03-18 NOTE — Transfer of Care (Signed)
Immediate Anesthesia Transfer of Care Note  Patient: Kathryn Merritt  Procedure(s) Performed: CARDIOVERSION  Patient Location: PACU  Anesthesia Type:MAC  Level of Consciousness: awake, alert , oriented and patient cooperative  Airway & Oxygen Therapy: Patient Spontanous Breathing and Patient connected to face mask oxygen  Post-op Assessment: Report given to RN, Post -op Vital signs reviewed and stable and Patient moving all extremities  Post vital signs: Reviewed and stable  Last Vitals:  Vitals Value Taken Time  BP    Temp    Pulse    Resp    SpO2      Last Pain:  Vitals:   03/18/21 1040  TempSrc: Oral  PainSc: 0-No pain         Complications: No notable events documented.

## 2021-03-18 NOTE — Progress Notes (Signed)
Electrical Cardioversion Procedure Note Kathryn Merritt [id number] 06-30-1961  Procedure: Electrical Cardioversion Indications:  Atrial Fibrillation  Procedure Details Consent: Risks of procedure as well as the alternatives and risks of each were explained to the (patient/caregiver).  Consent for procedure obtained.  Time Out: Verified patient identification, verified procedure, site/side was marked, verified correct patient position, special equipment/implants available, medications/allergies/relevent history reviewed, required imaging and test results available. Time out 11:31 Patient placed on cardiac monitor, pulse oximetry, supplemental oxygen as necessary.  Sedation given:  propofol Pacer pads placed anterior and posterior chest.  Cardioverted 1 time(s).  Cardioverted at 120J.  Evaluation Findings: Post procedure EKG shows: NSR Complications: None Patient did tolerate procedure well.   Hillery Jacks 03/18/2021, 12:03 PM

## 2021-03-19 ENCOUNTER — Encounter (HOSPITAL_COMMUNITY): Payer: Self-pay | Admitting: Internal Medicine

## 2021-03-19 ENCOUNTER — Encounter: Payer: Self-pay | Admitting: Student

## 2021-03-19 DIAGNOSIS — E1165 Type 2 diabetes mellitus with hyperglycemia: Secondary | ICD-10-CM | POA: Diagnosis not present

## 2021-03-19 DIAGNOSIS — I4891 Unspecified atrial fibrillation: Secondary | ICD-10-CM | POA: Diagnosis not present

## 2021-03-19 LAB — HIV ANTIBODY (ROUTINE TESTING W REFLEX): HIV Screen 4th Generation wRfx: NONREACTIVE

## 2021-03-19 LAB — GLUCOSE, CAPILLARY: Glucose-Capillary: 283 mg/dL — ABNORMAL HIGH (ref 70–99)

## 2021-03-19 LAB — BASIC METABOLIC PANEL
Anion gap: 8 (ref 5–15)
BUN: 16 mg/dL (ref 6–20)
CO2: 25 mmol/L (ref 22–32)
Calcium: 8.9 mg/dL (ref 8.9–10.3)
Chloride: 102 mmol/L (ref 98–111)
Creatinine, Ser: 0.92 mg/dL (ref 0.44–1.00)
GFR, Estimated: >60 mL/min (ref >60)
Glucose, Bld: 317 mg/dL — ABNORMAL HIGH (ref 70–99)
Potassium: 4 mmol/L (ref 3.5–5.1)
Sodium: 135 mmol/L (ref 135–145)

## 2021-03-19 LAB — CBC
HCT: 47.1 % — ABNORMAL HIGH (ref 36.0–46.0)
Hemoglobin: 15.1 g/dL — ABNORMAL HIGH (ref 12.0–15.0)
MCH: 29.3 pg (ref 26.0–34.0)
MCHC: 32.1 g/dL (ref 30.0–36.0)
MCV: 91.5 fL (ref 80.0–100.0)
Platelets: 214 10*3/uL (ref 150–400)
RBC: 5.15 MIL/uL — ABNORMAL HIGH (ref 3.87–5.11)
RDW: 13.5 % (ref 11.5–15.5)
WBC: 6.2 10*3/uL (ref 4.0–10.5)
nRBC: 0 % (ref 0.0–0.2)

## 2021-03-19 LAB — HEMOGLOBIN A1C
Hgb A1c MFr Bld: 12.2 % — ABNORMAL HIGH (ref 4.8–5.6)
Mean Plasma Glucose: 303 mg/dL

## 2021-03-19 LAB — LIPID PANEL
Cholesterol: 204 mg/dL — ABNORMAL HIGH (ref 0–200)
HDL: 44 mg/dL (ref >40)
LDL Cholesterol: 132 mg/dL — ABNORMAL HIGH (ref 0–99)
Total CHOL/HDL Ratio: 4.6 RATIO
Triglycerides: 140 mg/dL (ref <150)
VLDL: 28 mg/dL (ref 0–40)

## 2021-03-19 MED ORDER — HYDRALAZINE HCL 20 MG/ML IJ SOLN
10.0000 mg | Freq: Once | INTRAMUSCULAR | Status: AC
  Administered 2021-03-19: 10 mg via INTRAVENOUS
  Filled 2021-03-19: qty 1

## 2021-03-19 MED ORDER — ATORVASTATIN CALCIUM 20 MG PO TABS: 20.0000 mg | ORAL_TABLET | Freq: Every day | ORAL | Status: DC

## 2021-03-19 MED ORDER — AMLODIPINE BESYLATE 5 MG PO TABS
5.0000 mg | ORAL_TABLET | Freq: Every day | ORAL | Status: DC
  Administered 2021-03-19: 5 mg via ORAL
  Filled 2021-03-19: qty 1

## 2021-03-19 MED ORDER — ATORVASTATIN CALCIUM 20 MG PO TABS
20.0000 mg | ORAL_TABLET | Freq: Every day | ORAL | 1 refills | Status: DC
Start: 2021-03-19 — End: 2021-06-18

## 2021-03-19 MED ORDER — APIXABAN 5 MG PO TABS: 5.0000 mg | ORAL_TABLET | Freq: Two times a day (BID) | ORAL | 1 refills | Status: DC

## 2021-03-19 MED ORDER — METOPROLOL SUCCINATE ER 25 MG PO TB24: 25.0000 mg | ORAL_TABLET | Freq: Every day | ORAL | 1 refills | Status: DC

## 2021-03-19 NOTE — Progress Notes (Signed)
Inpatient Diabetes Program Recommendations  AACE/ADA: New Consensus Statement on Inpatient Glycemic Control (2015)  Target Ranges:  Prepandial:   less than 140 mg/dL      Peak postprandial:   less than 180 mg/dL (1-2 hours)      Critically ill patients:  140 - 180 mg/dL   Lab Results  Component Value Date   GLUCAP 283 (H) 03/19/2021   HGBA1C 12.2 (H) 03/18/2021    Spoke with pt in regards to glucose levels and possibly needing insulin at time of follow up. Noted Dr. Carles Collet d/c'd pt on orals only. A1c not resulted yet. Stressed importance of glucose control at home. Discussed glucose and A1c goals. Told pt to make appt at health department for insulin dosing if needed. Pt reports husband just got insurance and she will be making appt with a new PCP soon.  Thanks,  Tama Headings RN, MSN, BC-ADM Inpatient Diabetes Coordinator Team Pager [phone] (8a-5p)

## 2021-03-19 NOTE — Discharge Instructions (Signed)

## 2021-03-19 NOTE — Progress Notes (Addendum)
Progress Note  Patient Name: Kathryn Merritt Date of Encounter: 03/19/2021  [facility] Cardiologist: Previously Dr. Percival Spanish (last visit in 2013) - She lives in [place] and would like to follow-up there long-term  Subjective   Breathing improved. No chest pain or palpitations. BP has been elevated overnight. Says she did not sleep well.   Inpatient Medications    Scheduled Meds:  apixaban  5 mg Oral BID   Chlorhexidine Gluconate Cloth  6 each Topical Daily   hydrochlorothiazide  12.5 mg Oral Daily   insulin aspart  0-15 Units Subcutaneous TID WC   insulin aspart  0-5 Units Subcutaneous QHS   insulin detemir  10 Units Subcutaneous BID   lisinopril  20 mg Oral Daily   metoprolol succinate  25 mg Oral Daily   pantoprazole  40 mg Oral Daily   simvastatin  10 mg Oral QHS   Continuous Infusions:  PRN Meds: acetaminophen **OR** acetaminophen, albuterol, ondansetron **OR** ondansetron (ZOFRAN) IV, traZODone   Vital Signs    Vitals:   03/19/21 0439 03/19/21 0600 03/19/21 0700 03/19/21 0800  BP:  (!) 181/103 (!) 192/88   Pulse:  62 (!) 59 71  Resp:  18 14 17   Temp: 97.8 F (36.6 C)     TempSrc: Oral     SpO2:  96% 99% 98%  Weight: 116.5 kg     Height:        Intake/Output Summary (Last 24 hours) at 03/19/2021 0832 Last data filed at 03/19/2021 0439 Gross per 24 hour  Intake 611.84 ml  Output 1875 ml  Net -1263.16 ml   Last 3 Weights 03/19/2021 03/18/2021 03/18/2021  Weight (lbs) 256 lb 13.4 oz 250 lb 7.1 oz 255 lb  Weight (kg) 116.5 kg 113.6 kg 115.667 kg      Telemetry    NSR, HR in 60's to 70's with occasional PVC's.  - Personally Reviewed  ECG    NSR, HR 76 with PAC's and RBBB.  - Personally Reviewed  Physical Exam   GEN: Pleasant female appearing in no acute distress.   Neck: No JVD Cardiac: RRR, no murmurs, rubs, or gallops.  Respiratory: Clear to auscultation bilaterally without wheezing or rales. GI: Soft, nontender, non-distended  MS: No edema;  No deformity. Neuro:  Nonfocal  Psych: Normal affect   Labs    High Sensitivity Troponin:   Recent Labs  Lab 03/18/21 0600 03/18/21 0740  TROPONINIHS 27* 39*      Chemistry Recent Labs  Lab 03/18/21 0600 03/19/21 0540  NA 139 135  K 4.0 4.0  CL 105 102  CO2 23 25  GLUCOSE 396* 317*  BUN 15 16  CREATININE 0.97 0.92  CALCIUM 9.5 8.9  PROT 7.6  --   ALBUMIN 3.9  --   AST 34  --   ALT 40  --   ALKPHOS 76  --   BILITOT 0.9  --   GFRNONAA >60 >60  ANIONGAP 11 8     Hematology Recent Labs  Lab 03/18/21 0600 03/19/21 0540  WBC 8.5 6.2  RBC 5.81* 5.15*  HGB 17.0* 15.1*  HCT 52.6* 47.1*  MCV 90.5 91.5  MCH 29.3 29.3  MCHC 32.3 32.1  RDW 13.5 13.5  PLT 261 214    BNPNo results for input(s): BNP, PROBNP in the last 168 hours.   DDimer No results for input(s): DDIMER in the last 168 hours.   Radiology    DG Chest Portable 1 View  Result Date:  03/18/2021 CLINICAL DATA:  Shortness of breath. EXAM: PORTABLE CHEST 1 VIEW COMPARISON:  02/22/2020. FINDINGS: Heart size normal. Low lung volumes with bibasilar atelectasis. Mild left base infiltrate cannot be excluded. No pleural effusion or pneumothorax. IMPRESSION: Low lung volumes with bibasilar atelectasis. Mild left base infiltrate cannot be excluded. Electronically Signed   By: Marcello Moores  Register   On: 03/18/2021 06:15   Cardiac Studies   Echocardiogram: 03/18/2021 IMPRESSIONS     1. Left ventricular ejection fraction, by estimation, is 65 to 70%. The  left ventricle has normal function. The left ventricle has no regional  wall motion abnormalities. There is moderate left ventricular hypertrophy.  Left ventricular diastolic  parameters were normal.   2. Right ventricular systolic function is normal. The right ventricular  size is normal. There is normal pulmonary artery systolic pressure. The  estimated right ventricular systolic pressure is 79.0 mmHg.   3. Left atrial size was mildly dilated.   4. The  mitral valve is grossly normal. Trivial mitral valve  regurgitation.   5. The aortic valve is tricuspid. Aortic valve regurgitation is not  visualized.   6. The inferior vena cava is normal in size with greater than 50%  respiratory variability, suggesting right atrial pressure of 3 mmHg.   Patient Profile     60 y.o. female w/ PMH of HTN, Type 2 DM, HLD and GERD who presented for evaluation of dyspnea and palpitations, found to be in new-onset atrial fibrillation with RVR.   Assessment & Plan    1. Atrial Fibrillation with RVR - New diagnosis for the patient this admission and her story seems most consistent with the arrhythmia starting around 0200 the day of admission. - She did undergo DCCV yesterday and converted back to NSR with a 120 J shock. Has maintained NSR overnight and this morning with HR in the 60's at this time. Will continue Toprol-XL 25mg  daily.  - She has been started on Eliquis 5mg  BID for anticoagulation given her CHA2DS2-VASc Score of 3. Will consult Case Management for 30-day card and co-pay card. Will need a referral for a sleep study as an outpatient as discussed below.    2. Hypertensive Urgency - BP has been elevated this morning, at 192/88 on most recent check. Did receive IV Hydralazine 10mg  at 0600. She did not receive her Lisinopril or HCTZ yesterday due to being downstairs for her DCCV. I have asked her nurse to go ahead and give her morning medications to include Toprol-XL, Lisinopril and HCTZ. Pending BP response, can add back her PTA Amlodipine 5mg  daily as she is now off IV Cardizem.    3. Elevated Troponin Values - Initial Hs Troponin 6 with repeat values of 9, 27 and 39. Enzyme elevation is most consistent with demand ischemia due to atrial fibrillation with RVR and hypertensive urgency and not ACS. Echo this admission shows a preserved EF of 65-70% with no regional WMA. No plans for further inpatient ischemic testing.   4. HLD - She has been continued on  PTA Simvastatin 10mg  daily. Would consider switching to Crestor as an outpatient given the multiple drug interactions with Simvastatin.   5. Abnormal TSH - TSH at 6.135 this admission but Free T4 normal at 0.78. No indication for medical therapy at this time. Can be followed by her PCP as an outpatient.    6. OSA - She has been told she has OSA in the past but is not currently on CPAP. Will need a repeat sleep study  as an outpatient and can refer to [facility] here in [place].   Will arrange for outpatient follow-up at out [facility] office and further follow-up can be with Dr. Percival Spanish in [place]. A work-note has been provided to the patient. Likely discharge later today pending improvement in BP.   For questions or updates, please contact [place] Please consult [email or web address] for contact info under        Signed, Erma Heritage, PA-C  03/19/2021, 8:32 AM    --------------------------------------------------------------------------------------------------- Attending note:  Patient seen and examined. Agree with note by Bernerd Pho, PA. Ms Macaluso presented with newly diagnosed atrial fibrillation. Was in RVR and successfully cardioverted yesterday. Has maintained sinus rhythm overnight. Agree with outpatient Eliquis. Will need optimization of blood pressure control prior to discharge. Resume her antihypertensives and reassess BP later in the day to determine if it is safe to send the patient home today.  Melina Schools, MD, [facility]

## 2021-03-19 NOTE — Discharge Summary (Signed)
Physician Discharge Summary  Kathryn Merritt DOB: 12-05-1960 DOA: 03/18/2021  PCP: Sandria Manly [place] date: 4/97/5300 Discharge date: 03/19/2021  Admitted From: Home Disposition:  Home   Recommendations for Outpatient Follow-up:  Follow up with PCP in 1-2 weeks Please obtain BMP/CBC in one week   Discharge Condition: Stable CODE STATUS: FULL Diet recommendation: Heart Healthy / Carb Modified    Brief/Interim Summary: 60 y.o. female with medical history of 60 year old female with a history of hypertension, diabetes mellitus type 2, hyperlipidemia presenting with palpitations that started around 2 AM on the morning of 03/18/2021 when she woke up to go to the bathroom.  Patient states that she has had transient palpitations in the past, but this has not happened for over 1 year.  She had some dizziness and felt fatigued this morning with associated palpitations.  The patient states that she has had some chest discomfort intermittently for many weeks.  She has attributed it to "heartburn".  She states that she normally takes some Zantac with some relief of her chest discomfort.  However it comes back when the Zantac wears off.  She denies any worsening shortness of breath, nausea, vomiting, diarrhea, abdominal, fever, chills, coughing, hemoptysis.  She denies any new medications.  In fact, the patient states that she has been trying to "stretch out" her home medications secondary to financial reasons. In the emergency department, the patient was afebrile hemodynamically stable with oxygen saturation 95-90% on room air.  BMP, LFTs, and CBC were essentially unremarkable.  Patient was noted to have atrial fibrillation with RVR.  EKG showed atrial fibrillation with right branch block.  Chest x-ray showed bibasilar atelectasis.  Troponin at 9>> 27 The patient was started on diltiazem drip.  Cardiology was consulted to assist with management.  She underwent DCCV  cardioversion on 6/29 and remained in sinus rhythm thereafter.  Her IV heparin was transitioned to po apixaban  Discharge Diagnoses:  New onset Atrial fibrillation -continue diltiazem drip>>po metoprolol succinate -CHADSVASc = 3 -start IV heparin>>po apixaban -cardiology consult appreciated>>DCCV cardioversion 6/29 -remained in sinus thereafter -echo -TSH 6.135 -Free T4-0.78   Elevated Troponin -demand ischemia -6>>9>>27>>39 -no WMA on echo   Diabetes mellitus type 2, uncontrolled with hyperglycemia -Hemoglobin A1c--pending at time of d/c -Holding Amaryl and Janumet--restart after d/c -NovoLog sliding scale -will need to follow up with [facility] as I suspect she may need to be started on insulin   Essential hypertension -Continue amlodipine and lisinopril -metoprolol succinate added   Hyperlipidemia -switched simvastatin to atorvastatin due interaction with amlodipine   Morbid Obesity -BMI 45.17 -lifestyle modification  Euthyroid Sick -TSH 6.135 -Free T4 0,78 -repeat thyroid functions in one month     Discharge Instructions   Allergies as of 03/19/2021   No Known Allergies      Medication List     STOP taking these medications    albuterol 108 (90 Base) MCG/ACT inhaler Commonly known as: VENTOLIN HFA   metFORMIN 1000 MG (MOD) 24 hr tablet Commonly known as: GLUMETZA   methocarbamol 500 MG tablet Commonly known as: ROBAXIN   predniSONE 20 MG tablet Commonly known as: DELTASONE   simvastatin 10 MG tablet Commonly known as: ZOCOR   Vitamin D (Ergocalciferol) 1.25 MG (50000 UNIT) Caps capsule Commonly known as: DRISDOL       TAKE these medications    amLODipine 5 MG tablet Commonly known as: NORVASC Take 5 mg by mouth daily.   apixaban 5 MG  Tabs tablet Commonly known as: ELIQUIS Take 1 tablet (5 mg total) by mouth 2 (two) times daily.   atorvastatin 20 MG tablet Commonly known as: LIPITOR Take 1 tablet (20 mg  total) by mouth daily with supper.   glimepiride 2 MG tablet Commonly known as: AMARYL Take 2 mg by mouth 2 (two) times daily.   ibuprofen 200 MG tablet Commonly known as: ADVIL Take 800 mg by mouth every 6 (six) hours as needed for pain.   Janumet 50-1000 MG tablet Generic drug: sitaGLIPtin-metformin Take 1 tablet by mouth 2 (two) times daily.   lisinopril-hydrochlorothiazide 20-12.5 MG tablet Commonly known as: ZESTORETIC Take 1 tablet by mouth daily.   metoprolol succinate 25 MG 24 hr tablet Commonly known as: TOPROL-XL Take 1 tablet (25 mg total) by mouth daily. Start taking on: March 20, 2021   omeprazole 20 MG capsule Commonly known as: PRILOSEC Take 20 mg by mouth daily.        Follow-up Information     Charlie Pitter, PA-C Follow up on 04/14/2021.   Specialties: Cardiology, Radiology Why: Cardiology Hospital Follow-up on 04/14/2021 at 2:30 PM with Melina Copa, PA-C in the [facility]. Contact information: [place] [address] [phone]                No Known Allergies  Consultations: cardiology   Procedures/Studies: DG Chest Portable 1 View  Result Date: 03/18/2021 CLINICAL DATA:  Shortness of breath. EXAM: PORTABLE CHEST 1 VIEW COMPARISON:  02/22/2020. FINDINGS: Heart size normal. Low lung volumes with bibasilar atelectasis. Mild left base infiltrate cannot be excluded. No pleural effusion or pneumothorax. IMPRESSION: Low lung volumes with bibasilar atelectasis. Mild left base infiltrate cannot be excluded. Electronically Signed   By: Marcello Moores  Register   On: 03/18/2021 06:15   ECHOCARDIOGRAM COMPLETE  Result Date: 03/18/2021    ECHOCARDIOGRAM REPORT   Patient Name:   Kathryn Merritt [facility] Date of Exam: 03/18/2021 Medical Rec #:  [id number]      Height:       63.0 in Accession #:    [id number]     Weight:       250.4 lb Date of Birth:  Dec 14, 1960      BSA:          2.128 m Patient Age:    75 years       BP:           167/74 mmHg Patient  Gender: F              HR:           62 bpm. Exam Location:  [facility] Procedure: 2D Echo, Cardiac Doppler and Color Doppler Indications:    Atrial Fibrillation  History:        Patient has prior history of Echocardiogram examinations, most                 recent 07/04/2012. Arrythmias:LBBB and Atrial Fibrillation; Risk                 Factors:Diabetes.  Sonographer:    Wenda Low Referring Phys: [phone] Raymar Joiner IMPRESSIONS  1. Left ventricular ejection fraction, by estimation, is 65 to 70%. The left ventricle has normal function. The left ventricle has no regional wall motion abnormalities. There is moderate left ventricular hypertrophy. Left ventricular diastolic parameters were normal.  2. Right ventricular systolic function is normal. The right ventricular size is normal. There is normal pulmonary artery systolic pressure.  The estimated right ventricular systolic pressure is 45.8 mmHg.  3. Left atrial size was mildly dilated.  4. The mitral valve is grossly normal. Trivial mitral valve regurgitation.  5. The aortic valve is tricuspid. Aortic valve regurgitation is not visualized.  6. The inferior vena cava is normal in size with greater than 50% respiratory variability, suggesting right atrial pressure of 3 mmHg. FINDINGS  Left Ventricle: Left ventricular ejection fraction, by estimation, is 65 to 70%. The left ventricle has normal function. The left ventricle has no regional wall motion abnormalities. The left ventricular internal cavity size was normal in size. There is  moderate left ventricular hypertrophy. Left ventricular diastolic parameters were normal. Right Ventricle: The right ventricular size is normal. No increase in right ventricular wall thickness. Right ventricular systolic function is normal. There is normal pulmonary artery systolic pressure. The tricuspid regurgitant velocity is 2.81 m/s, and  with an assumed right atrial pressure of 3 mmHg, the estimated right ventricular systolic  pressure is 09.9 mmHg. Left Atrium: Left atrial size was mildly dilated. Right Atrium: Right atrial size was normal in size. Pericardium: There is no evidence of pericardial effusion. Mitral Valve: The mitral valve is grossly normal. Mild mitral annular calcification. Trivial mitral valve regurgitation. MV peak gradient, 4.0 mmHg. The mean mitral valve gradient is 1.0 mmHg. Tricuspid Valve: The tricuspid valve is grossly normal. Tricuspid valve regurgitation is trivial. Aortic Valve: The aortic valve is tricuspid. Aortic valve regurgitation is not visualized. Aortic valve mean gradient measures 7.0 mmHg. Aortic valve peak gradient measures 14.0 mmHg. Aortic valve area, by VTI measures 2.43 cm. Pulmonic Valve: The pulmonic valve was grossly normal. Pulmonic valve regurgitation is trivial. Aorta: The aortic root is normal in size and structure. Venous: The inferior vena cava is normal in size with greater than 50% respiratory variability, suggesting right atrial pressure of 3 mmHg. IAS/Shunts: No atrial level shunt detected by color flow Doppler.  LEFT VENTRICLE PLAX 2D LVIDd:         4.81 cm  Diastology LVIDs:         2.63 cm  LV e' medial:    6.75 cm/s LV PW:         1.31 cm  LV E/e' medial:  12.8 LV IVS:        1.43 cm  LV e' lateral:   7.62 cm/s LVOT diam:     2.00 cm  LV E/e' lateral: 11.3 LV SV:         96 LV SV Index:   45 LVOT Area:     3.14 cm  RIGHT VENTRICLE RV Basal diam:  3.67 cm RV Mid diam:    2.99 cm RV S prime:     15.90 cm/s TAPSE (M-mode): 3.1 cm LEFT ATRIUM             Index       RIGHT ATRIUM           Index LA diam:        4.30 cm 2.02 cm/m  RA Area:     18.70 cm LA Vol (A2C):   71.6 ml 33.65 ml/m RA Volume:   46.90 ml  22.04 ml/m LA Vol (A4C):   87.7 ml 41.22 ml/m LA Biplane Vol: 84.5 ml 39.72 ml/m  AORTIC VALVE AV Area (Vmax):    2.22 cm AV Area (Vmean):   2.04 cm AV Area (VTI):     2.43 cm AV Vmax:  187.00 cm/s AV Vmean:          128.000 cm/s AV VTI:            0.397 m AV  Peak Grad:      14.0 mmHg AV Mean Grad:      7.0 mmHg LVOT Vmax:         132.00 cm/s LVOT Vmean:        83.300 cm/s LVOT VTI:          0.307 m LVOT/AV VTI ratio: 0.77  AORTA Ao Root diam: 3.60 cm Ao Asc diam:  3.40 cm MITRAL VALVE               TRICUSPID VALVE MV Area (PHT): 2.88 cm    TR Peak grad:   31.6 mmHg MV Area VTI:   2.83 cm    TR Vmax:        281.00 cm/s MV Peak grad:  4.0 mmHg MV Mean grad:  1.0 mmHg    SHUNTS MV Vmax:       1.00 m/s    Systemic VTI:  0.31 m MV Vmean:      47.8 cm/s   Systemic Diam: 2.00 cm MV Decel Time: 263 msec MV E velocity: 86.30 cm/s MV A velocity: 82.50 cm/s MV E/A ratio:  1.05 Rozann Lesches MD Electronically signed by Rozann Lesches MD Signature Date/Time: 03/18/2021/4:41:09 PM    Final         Discharge Exam: Vitals:   03/19/21 0700 03/19/21 0800  BP: (!) 192/88 (!) 193/102  Pulse: (!) 59 71  Resp: 14 17  Temp:    SpO2: 99% 98%   Vitals:   03/19/21 0439 03/19/21 0600 03/19/21 0700 03/19/21 0800  BP:  (!) 181/103 (!) 192/88 (!) 193/102  Pulse:  62 (!) 59 71  Resp:  18 14 17   Temp: 97.8 F (36.6 C)     TempSrc: Oral     SpO2:  96% 99% 98%  Weight: 116.5 kg     Height:        General: Pt is alert, awake, not in acute distress Cardiovascular: RRR, S1/S2 +, no rubs, no gallops Respiratory: CTA bilaterally, no wheezing, no rhonchi Abdominal: Soft, NT, ND, bowel sounds + Extremities: no edema, no cyanosis   The results of significant diagnostics from this hospitalization (including imaging, microbiology, ancillary and laboratory) are listed below for reference.    Significant Diagnostic Studies: DG Chest Portable 1 View  Result Date: 03/18/2021 CLINICAL DATA:  Shortness of breath. EXAM: PORTABLE CHEST 1 VIEW COMPARISON:  02/22/2020. FINDINGS: Heart size normal. Low lung volumes with bibasilar atelectasis. Mild left base infiltrate cannot be excluded. No pleural effusion or pneumothorax. IMPRESSION: Low lung volumes with bibasilar atelectasis.  Mild left base infiltrate cannot be excluded. Electronically Signed   By: Marcello Moores  Register   On: 03/18/2021 06:15   ECHOCARDIOGRAM COMPLETE  Result Date: 03/18/2021    ECHOCARDIOGRAM REPORT   Patient Name:   Kathryn Merritt [facility] Date of Exam: 03/18/2021 Medical Rec #:  [id number]      Height:       63.0 in Accession #:    [id number]     Weight:       250.4 lb Date of Birth:  09/15/1961      BSA:          2.128 m Patient Age:    46 years       BP:  167/74 mmHg Patient Gender: F              HR:           62 bpm. Exam Location:  [facility] Procedure: 2D Echo, Cardiac Doppler and Color Doppler Indications:    Atrial Fibrillation  History:        Patient has prior history of Echocardiogram examinations, most                 recent 07/04/2012. Arrythmias:LBBB and Atrial Fibrillation; Risk                 Factors:Diabetes.  Sonographer:    Wenda Low Referring Phys: [phone] Hollister Wessler IMPRESSIONS  1. Left ventricular ejection fraction, by estimation, is 65 to 70%. The left ventricle has normal function. The left ventricle has no regional wall motion abnormalities. There is moderate left ventricular hypertrophy. Left ventricular diastolic parameters were normal.  2. Right ventricular systolic function is normal. The right ventricular size is normal. There is normal pulmonary artery systolic pressure. The estimated right ventricular systolic pressure is 96.0 mmHg.  3. Left atrial size was mildly dilated.  4. The mitral valve is grossly normal. Trivial mitral valve regurgitation.  5. The aortic valve is tricuspid. Aortic valve regurgitation is not visualized.  6. The inferior vena cava is normal in size with greater than 50% respiratory variability, suggesting right atrial pressure of 3 mmHg. FINDINGS  Left Ventricle: Left ventricular ejection fraction, by estimation, is 65 to 70%. The left ventricle has normal function. The left ventricle has no regional wall motion abnormalities. The left ventricular internal cavity  size was normal in size. There is  moderate left ventricular hypertrophy. Left ventricular diastolic parameters were normal. Right Ventricle: The right ventricular size is normal. No increase in right ventricular wall thickness. Right ventricular systolic function is normal. There is normal pulmonary artery systolic pressure. The tricuspid regurgitant velocity is 2.81 m/s, and  with an assumed right atrial pressure of 3 mmHg, the estimated right ventricular systolic pressure is 45.4 mmHg. Left Atrium: Left atrial size was mildly dilated. Right Atrium: Right atrial size was normal in size. Pericardium: There is no evidence of pericardial effusion. Mitral Valve: The mitral valve is grossly normal. Mild mitral annular calcification. Trivial mitral valve regurgitation. MV peak gradient, 4.0 mmHg. The mean mitral valve gradient is 1.0 mmHg. Tricuspid Valve: The tricuspid valve is grossly normal. Tricuspid valve regurgitation is trivial. Aortic Valve: The aortic valve is tricuspid. Aortic valve regurgitation is not visualized. Aortic valve mean gradient measures 7.0 mmHg. Aortic valve peak gradient measures 14.0 mmHg. Aortic valve area, by VTI measures 2.43 cm. Pulmonic Valve: The pulmonic valve was grossly normal. Pulmonic valve regurgitation is trivial. Aorta: The aortic root is normal in size and structure. Venous: The inferior vena cava is normal in size with greater than 50% respiratory variability, suggesting right atrial pressure of 3 mmHg. IAS/Shunts: No atrial level shunt detected by color flow Doppler.  LEFT VENTRICLE PLAX 2D LVIDd:         4.81 cm  Diastology LVIDs:         2.63 cm  LV e' medial:    6.75 cm/s LV PW:         1.31 cm  LV E/e' medial:  12.8 LV IVS:        1.43 cm  LV e' lateral:   7.62 cm/s LVOT diam:     2.00 cm  LV E/e' lateral: 11.3 LV SV:  96 LV SV Index:   45 LVOT Area:     3.14 cm  RIGHT VENTRICLE RV Basal diam:  3.67 cm RV Mid diam:    2.99 cm RV S prime:     15.90 cm/s TAPSE  (M-mode): 3.1 cm LEFT ATRIUM             Index       RIGHT ATRIUM           Index LA diam:        4.30 cm 2.02 cm/m  RA Area:     18.70 cm LA Vol (A2C):   71.6 ml 33.65 ml/m RA Volume:   46.90 ml  22.04 ml/m LA Vol (A4C):   87.7 ml 41.22 ml/m LA Biplane Vol: 84.5 ml 39.72 ml/m  AORTIC VALVE AV Area (Vmax):    2.22 cm AV Area (Vmean):   2.04 cm AV Area (VTI):     2.43 cm AV Vmax:           187.00 cm/s AV Vmean:          128.000 cm/s AV VTI:            0.397 m AV Peak Grad:      14.0 mmHg AV Mean Grad:      7.0 mmHg LVOT Vmax:         132.00 cm/s LVOT Vmean:        83.300 cm/s LVOT VTI:          0.307 m LVOT/AV VTI ratio: 0.77  AORTA Ao Root diam: 3.60 cm Ao Asc diam:  3.40 cm MITRAL VALVE               TRICUSPID VALVE MV Area (PHT): 2.88 cm    TR Peak grad:   31.6 mmHg MV Area VTI:   2.83 cm    TR Vmax:        281.00 cm/s MV Peak grad:  4.0 mmHg MV Mean grad:  1.0 mmHg    SHUNTS MV Vmax:       1.00 m/s    Systemic VTI:  0.31 m MV Vmean:      47.8 cm/s   Systemic Diam: 2.00 cm MV Decel Time: 263 msec MV E velocity: 86.30 cm/s MV A velocity: 82.50 cm/s MV E/A ratio:  1.05 Rozann Lesches MD Electronically signed by Rozann Lesches MD Signature Date/Time: 03/18/2021/4:41:09 PM    Final     Microbiology: Recent Results (from the past 240 hour(s))  Resp Panel by RT-PCR (Flu A&B, Covid) Nasopharyngeal Swab     Status: None   Collection Time: 03/18/21  8:15 AM   Specimen: Nasopharyngeal Swab; Nasopharyngeal(NP) swabs in vial transport medium  Result Value Ref Range Status   SARS Coronavirus 2 by RT PCR NEGATIVE NEGATIVE Final    Comment: (NOTE) SARS-CoV-2 target nucleic acids are NOT DETECTED.  The SARS-CoV-2 RNA is generally detectable in upper respiratory specimens during the acute phase of infection. The lowest concentration of SARS-CoV-2 viral copies this assay can detect is 138 copies/mL. A negative result does not preclude SARS-Cov-2 infection and should not be used as the sole basis for  treatment or other patient management decisions. A negative result may occur with  improper specimen collection/handling, submission of specimen other than nasopharyngeal swab, presence of viral mutation(s) within the areas targeted by this assay, and inadequate number of viral copies(<138 copies/mL). A negative result must be combined with clinical observations, patient history, and epidemiological information. The  expected result is Negative.  Fact Sheet for Patients:  EntrepreneurPulse.com.au  Fact Sheet for Healthcare Providers:  IncredibleEmployment.be  This test is no t yet approved or cleared by the [place] FDA and  has been authorized for detection and/or diagnosis of SARS-CoV-2 by FDA under an Emergency Use Authorization (EUA). This EUA will remain  in effect (meaning this test can be used) for the duration of the COVID-19 declaration under Section 564(b)(1) of the Act, 21 U.S.C.section 360bbb-3(b)(1), unless the authorization is terminated  or revoked sooner.       Influenza A by PCR NEGATIVE NEGATIVE Final   Influenza B by PCR NEGATIVE NEGATIVE Final    Comment: (NOTE) The Xpert Xpress SARS-CoV-2/FLU/RSV plus assay is intended as an aid in the diagnosis of influenza from Nasopharyngeal swab specimens and should not be used as a sole basis for treatment. Nasal washings and aspirates are unacceptable for Xpert Xpress SARS-CoV-2/FLU/RSV testing.  Fact Sheet for Patients: EntrepreneurPulse.com.au  Fact Sheet for Healthcare Providers: IncredibleEmployment.be  This test is not yet approved or cleared by the [place] FDA and has been authorized for detection and/or diagnosis of SARS-CoV-2 by FDA under an Emergency Use Authorization (EUA). This EUA will remain in effect (meaning this test can be used) for the duration of the COVID-19 declaration under Section 564(b)(1) of the Act, 21  U.S.C. section 360bbb-3(b)(1), unless the authorization is terminated or revoked.  Performed at [facility], [address] [place], [place] [address]   MRSA Next Gen by PCR, Nasal     Status: None   Collection Time: 03/18/21  9:13 AM   Specimen: Nasal Mucosa; Nasal Swab  Result Value Ref Range Status   MRSA by PCR Next Gen NOT DETECTED NOT DETECTED Final    Comment: (NOTE) The GeneXpert MRSA Assay (FDA approved for NASAL specimens only), is one component of a comprehensive MRSA colonization surveillance program. It is not intended to diagnose MRSA infection nor to guide or monitor treatment for MRSA infections. Test performance is not FDA approved in patients less than 45 years old. Performed at [facility], [address], [place], [place] [address]      Labs: Basic Metabolic Panel: Recent Labs  Lab 03/18/21 0600 03/19/21 0540  NA 139 135  K 4.0 4.0  CL 105 102  CO2 23 25  GLUCOSE 396* 317*  BUN 15 16  CREATININE 0.97 0.92  CALCIUM 9.5 8.9   Liver Function Tests: Recent Labs  Lab 03/18/21 0600  AST 34  ALT 40  ALKPHOS 76  BILITOT 0.9  PROT 7.6  ALBUMIN 3.9   No results for input(s): LIPASE, AMYLASE in the last 168 hours. No results for input(s): AMMONIA in the last 168 hours. CBC: Recent Labs  Lab 03/18/21 0600 03/19/21 0540  WBC 8.5 6.2  NEUTROABS 4.0  --   HGB 17.0* 15.1*  HCT 52.6* 47.1*  MCV 90.5 91.5  PLT 261 214   Cardiac Enzymes: No results for input(s): CKTOTAL, CKMB, CKMBINDEX, TROPONINI in the last 168 hours. BNP: Invalid input(s): POCBNP CBG: Recent Labs  Lab 03/18/21 1045 03/18/21 1628 03/18/21 2103 03/19/21 0754  GLUCAP 317* 397* 330* 283*    Time coordinating discharge:  36 minutes  Signed:  Orson Eva, DO [facility] Pager: [phone] 03/19/2021, 10:10 AM

## 2021-03-25 ENCOUNTER — Telehealth: Payer: Self-pay | Admitting: Cardiology

## 2021-03-25 DIAGNOSIS — R079 Chest pain, unspecified: Secondary | ICD-10-CM

## 2021-03-25 MED ORDER — METOPROLOL SUCCINATE ER 50 MG PO TB24: 50.0000 mg | ORAL_TABLET | Freq: Every day | ORAL | 3 refills | Status: DC

## 2021-03-25 MED ORDER — AMLODIPINE BESYLATE 10 MG PO TABS: 10.0000 mg | ORAL_TABLET | Freq: Every day | ORAL | 3 refills | Status: DC

## 2021-03-25 NOTE — Telephone Encounter (Signed)
Patient called today after having several days of chest discomfort.She describes as "very mild", pain located near right breast. Feels SOB with exertion. Denies N/V, dizziness.Her BP this am is 187/89, HR 60.     I will forward to the DOD, Dr.Nishan for advice.

## 2021-03-25 NOTE — Telephone Encounter (Signed)
I spoke with patient and she agrees to increase toprol to 50 mg daily and amlodipine to 10 mg daily.   I have placed order for lexiscan and front office will call her to schedule along follow up.

## 2021-03-25 NOTE — Telephone Encounter (Signed)
Left message to return call 

## 2021-03-25 NOTE — Telephone Encounter (Signed)
Patient returned Cathey's call. She was seeing patients.

## 2021-03-25 NOTE — Telephone Encounter (Signed)
New message    Pt c/o of Chest Pain: STAT if CP now or developed within 24 hours  1. Are you having CP right now no  2. Are you experiencing any other symptoms (ex. SOB, nausea, vomiting, sweating)? Yes mild sob   3. How long have you been experiencing CP? A couple days  4. Is your CP continuous or coming and going? Comes and goes   5. Have you taken Nitroglycerin? Does not have any  - she was seen in ER for AFIB and they gave her a blood thinner and metoprolol , patient is concerned with pain in the center of her chest  , her bp and heart rate is good  ?

## 2021-03-30 ENCOUNTER — Telehealth: Payer: Self-pay | Admitting: Cardiovascular Disease

## 2021-03-30 NOTE — Telephone Encounter (Signed)
Patient called requesting to speak with a nurse in regards to her upcoming stress test.  Please call [phone].

## 2021-03-30 NOTE — Telephone Encounter (Signed)
Pt stated she would keep upcoming [place] appt.

## 2021-03-30 NOTE — Telephone Encounter (Signed)
Pt called states states that she is unsure about having Lexi scan stress test done. She has read online that it can cause heart attack or stroke. Ensured pt that test is safe. Please advise.

## 2021-04-02 ENCOUNTER — Other Ambulatory Visit: Payer: Self-pay

## 2021-04-02 ENCOUNTER — Encounter (HOSPITAL_COMMUNITY)
Admission: RE | Admit: 2021-04-02 | Discharge: 2021-04-02 | Disposition: A | Discharge status: Home / Self Care | Payer: 59 | Source: Ambulatory Visit | Attending: Cardiovascular Disease | Admitting: Cardiovascular Disease

## 2021-04-02 ENCOUNTER — Ambulatory Visit (HOSPITAL_COMMUNITY)
Admission: RE | Admit: 2021-04-02 | Discharge: 2021-04-02 | Disposition: A | Discharge status: Home / Self Care | Payer: 59 | Source: Ambulatory Visit | Attending: Cardiovascular Disease | Admitting: Cardiovascular Disease

## 2021-04-02 ENCOUNTER — Encounter (HOSPITAL_COMMUNITY): Payer: Self-pay

## 2021-04-02 DIAGNOSIS — R079 Chest pain, unspecified: Secondary | ICD-10-CM | POA: Diagnosis present

## 2021-04-02 MED ORDER — TECHNETIUM TC 99M TETROFOSMIN IV KIT
30.0000 | PACK | Freq: Once | INTRAVENOUS | Status: AC | PRN
  Administered 2021-04-02: 31.7 via INTRAVENOUS

## 2021-04-02 MED ORDER — REGADENOSON 0.4 MG/5ML IV SOLN
INTRAVENOUS | Status: AC
  Administered 2021-04-02: 0.4 mg via INTRAVENOUS
  Filled 2021-04-02: qty 5

## 2021-04-02 MED ORDER — TECHNETIUM TC 99M TETROFOSMIN IV KIT
10.0000 | PACK | Freq: Once | INTRAVENOUS | Status: AC | PRN
  Administered 2021-04-02: 11 via INTRAVENOUS

## 2021-04-02 MED ORDER — SODIUM CHLORIDE FLUSH 0.9 % IV SOLN
INTRAVENOUS | Status: AC
  Administered 2021-04-02: 10 mL via INTRAVENOUS
  Filled 2021-04-02: qty 10

## 2021-04-03 LAB — NM MYOCAR MULTI W/SPECT W/WALL MOTION / EF
LV dias vol: 68 mL (ref 46–106)
LV sys vol: 22 mL
Peak HR: 80 {beats}/min
RATE: 0.35
Rest HR: 53 {beats}/min
SDS: 2
SRS: 3
SSS: 5
TID: 1.1

## 2021-04-07 ENCOUNTER — Telehealth: Payer: Self-pay | Admitting: Cardiovascular Disease

## 2021-04-07 NOTE — Telephone Encounter (Signed)
Kathryn Hector, MD  Michaelyn Barter, RN Normal myovue study with no evidence of ischemia or infarction    Patient aware of results.

## 2021-04-07 NOTE — Telephone Encounter (Signed)
Pt. Is returning Pam's call from yesterday in regards to her results. Requesting callback on her work number will be there until 4. Please advise.

## 2021-04-13 ENCOUNTER — Encounter: Payer: Self-pay | Admitting: Physician Assistant

## 2021-04-13 NOTE — Progress Notes (Signed)
Cardiology Office Note    Date:  04/14/2021   ID:  Kathryn Merritt, DOB 12-Oct-1960, MRN [id number]  PCP:  [facility], [facility]  Cardiologist:  Previously Dr. Percival Spanish (last visit in 2013) - She lives in [place] and would like to follow-up there long-term but is also OK with [place] location if needed Electrophysiologist:  None   Chief Complaint: f/u atrial fibrillation  History of Present Illness:   Kathryn Merritt is a 60 y.o. female with history of HTN, uncontrolled DM, HLD, GERD, recently diagnosed atrial fib, RBBB, remote OSA diagnosis (not on CPAP), fatty liver by prior CT who presents for post-hospital follow-up. She was recently admitted 02/2021 with new onset atrial fibrillation starting around 0200 day of admission. Could not exclude atypical atrial flutter per notes. She was started on IV diltiazem but underwent DCCV 03/18/21 with successful conversion to NSR. She was started on Eliquis and Toprol. She was also noted to have mildly elevated troponin values felt c/w demand ischemia (also had hypertensive urgency during admission). Other notable issues include elevated Hgb, elevated TSH but normal FT4, and A1C 12.2. Simvastatin was switched to atorvastatin. 2D echo 03/18/21 EF 65-70%, moderate LVH, normal diastolic parameters, normal RV, mild LAE. She called the office 03/25/21 reporting chest pain so amlodipine and Toprol were increased. Outpatient stress test was performed 04/02/21 which was negative for ischemia.  She is seen back in follow-up overall feeling better. She has not had any further chest pain. She did notice when she got out of the hospital she was feeling generalized fatigue and DOE but this is improving. Thinking back it has probably been years since she's felt great. She has a long history of generalized muscle aches and orthopedic back issues. She is aware to limit ibuprofen use and now uses Tylenol. She has had intermittent lapses in insurance which have led to  disjointed primary care.  She is seeking a new PCP. New insurance starts 04/20/21. She has rare breakthrough heart flutters but no sustained symptoms like what prompted recent hospitalization. No bleeding on Eliquis.  Labwork independently reviewed: 02/2021 LDL 132, trig 140, HDL 44, K 4.0, glucose 317, Cr 0.92, Hgb 15.1, Hct 47.1, TSH ? 6.135 but FT4 normal, A1C 12.2, troponin 27->39  Past Medical History:  Diagnosis Date   Chronic back pain    Diabetes mellitus    Edema leg    Hyperlipidemia    Hypertension    Joint pain    Kidney stones    Other malaise and fatigue    PAF (paroxysmal atrial fibrillation) ([place])    a. new diagnosis in 02/2021 --> s/p DCCV on 03/18/2021   RBBB    Sinus bradycardia    Sleep apnea     Past Surgical History:  Procedure Laterality Date   CARDIOVERSION N/A 03/18/2021   Procedure: CARDIOVERSION;  Surgeon: Satira Sark, MD;  Location: [facility];  Service: Cardiovascular;  Laterality: N/A;   CESAREAN SECTION     times 2   LITHOTRIPSY      Current Medications: Current Meds  Medication Sig   amLODipine (NORVASC) 10 MG tablet Take 1 tablet (10 mg total) by mouth daily.   apixaban (ELIQUIS) 5 MG TABS tablet Take 1 tablet (5 mg total) by mouth 2 (two) times daily.   atorvastatin (LIPITOR) 20 MG tablet Take 1 tablet (20 mg total) by mouth daily with supper.   glimepiride (AMARYL) 2 MG tablet Take 2 mg by mouth 2 (two) times daily.  JANUMET 50-1000 MG tablet Take 1 tablet by mouth 2 (two) times daily.   lisinopril-hydrochlorothiazide (PRINZIDE,ZESTORETIC) 20-12.5 MG tablet Take 1 tablet by mouth daily.   metoprolol succinate (TOPROL XL) 50 MG 24 hr tablet Take 1 tablet (50 mg total) by mouth daily. Take with or immediately following a meal.      Allergies:   Patient has no known allergies.   Social History   Socioeconomic History   Marital status: Married    Spouse name: Not on file   Number of children: Not on file   Years of education: Not on  file   Highest education level: Not on file  Occupational History   Not on file  Tobacco Use   Smoking status: Never   Smokeless tobacco: Never  Vaping Use   Vaping Use: Never used  Substance and Sexual Activity   Alcohol use: No   Drug use: No   Sexual activity: Not on file  Other Topics Concern   Not on file  Social History Narrative   Not on file   Social Determinants of Health   Financial Resource Strain: Not on file  Food Insecurity: Not on file  Transportation Needs: Not on file  Physical Activity: Not on file  Stress: Not on file  Social Connections: Not on file     Family History:  The patient's family history includes Hypertension in her father.  ROS:   Please see the history of present illness.  All other systems are reviewed and otherwise negative.    EKGs/Labs/Other Studies Reviewed:    Studies reviewed are outlined and summarized above. Reports included below if pertinent.  2D echo 03/18/21  1. Left ventricular ejection fraction, by estimation, is 65 to 70%. The  left ventricle has normal function. The left ventricle has no regional  wall motion abnormalities. There is moderate left ventricular hypertrophy.  Left ventricular diastolic  parameters were normal.   2. Right ventricular systolic function is normal. The right ventricular  size is normal. There is normal pulmonary artery systolic pressure. The  estimated right ventricular systolic pressure is [id number] mmHg.   3. Left atrial size was mildly dilated.   4. The mitral valve is grossly normal. Trivial mitral valve  regurgitation.   5. The aortic valve is tricuspid. Aortic valve regurgitation is not  visualized.   6. The inferior vena cava is normal in size with greater than 50%  respiratory variability, suggesting right atrial pressure of 3 mmHg.   NST 03/2021 There was no ST segment deviation noted during stress. The study is normal.There are no perfusion defects This is a low risk study. The  left ventricular ejection fraction is hyperdynamic (>65%).      EKG:  EKG is ordered today, personally reviewed, demonstrating SB 56bpm, RBBB, nonspecific TW changes similar to prior.  Recent Labs: 03/18/2021: ALT 40; TSH 6.135 03/19/2021: BUN 16; Creatinine, Ser 0.92; Hemoglobin 15.1; Platelets 214; Potassium 4.0; Sodium 135  Recent Lipid Panel    Component Value Date/Time   CHOL 204 (H) 03/19/2021 0540   TRIG 140 03/19/2021 0540   HDL 44 03/19/2021 0540   CHOLHDL 4.6 03/19/2021 0540   VLDL 28 03/19/2021 0540   LDLCALC 132 (H) 03/19/2021 0540    PHYSICAL EXAM:    VS:  BP 118/68   Pulse 60   Ht '5\' 3"'$  (1.6 m)   Wt 260 lb 12.8 oz (118.3 kg)   SpO2 96%   BMI 46.20 kg/m   BMI: Body mass  index is 46.2 kg/m.  GEN: Well nourished, well developed female in no acute distress HEENT: normocephalic, atraumatic Neck: no JVD, carotid bruits, or masses Cardiac: RRR; no murmurs, rubs, or gallops, no edema  Respiratory:  clear to auscultation bilaterally, normal work of breathing GI: soft, nontender, nondistended, + BS MS: no deformity or atrophy Skin: warm and dry, no rash Neuro:  Alert and Oriented x 3, Strength and sensation are intact, follows commands Psych: euthymic mood, full affect  Wt Readings from Last 3 Encounters:  04/14/21 260 lb 12.8 oz (118.3 kg)  03/19/21 256 lb 13.4 oz (116.5 kg)  02/22/20 250 lb (113.4 kg)     ASSESSMENT & PLAN:    1. Paroxysmal atrial fibrillation - maintaining NSR on current regimen. She has an underlying RBBB without any evidence of more advanced conduction disease. Continue Eliquis for CHADSVASC 3 (HTN, DM, female). We discussed role of untreated OSA and weight as risk factors for recurrent atrial fibrillation. She plans to try beginning a walking program. She would like to revisit sleep study when her insurance begins. We will place the order (RN will be checking with [facility] team whether there is a cost difference between home and lab study). She  requested Eliquis samples today to bridge until her insurance kicks in which were provided today. She has been compliant with her regimen thus far. She is aware to notify if symptoms recur or increase in frequency. Regarding chest pain, this has resolved with medication titration. If this were to recur, consideration could be given to coronary CTA if she remains in NSR. Will check surveillance CBC when she returns for lipids in 3 weeks.  2. Essential HTN - controlled on present regimen. No changes made today. She is motivated to make lifestyle modifications as well.  3. Hyperlipidemia associated with T2DM - she has a long history of generalized muscle aches so does not seem specifically related to statin therapy. We discussed the importance of close follow-up of uncontrolled diabetes as this may be contributing to her generalized state of not feeling well over several years' time. It is possible thyroid could be contributing as well. Regarding her lipids, we will recheck CMET/lipid profile fasting in 3 weeks. Given her DM, would advocate for a goal LDL of <70 but also keeping mind her hx of fatty liver.  4.  OSA - She would like to revisit sleep study when her insurance begins. We will place the order (RN will be checking with [facility] team whether there is a cost difference between home and lab study).  Disposition: F/u with Dr. Richarda Overlie or [facility] team in 3 months.   Medication Adjustments/Labs and Tests Ordered: Current medicines are reviewed at length with the patient today.  Concerns regarding medicines are outlined above. Medication changes, Labs and Tests ordered today are summarized above and listed in the Patient Instructions accessible in Encounters.    Signed, Charlie Pitter, PA-C  04/14/2021 2:21 PM    [facility] Location in [place], [place] [address] Ph: [phone]; Fax [phone]

## 2021-04-14 ENCOUNTER — Ambulatory Visit (INDEPENDENT_AMBULATORY_CARE_PROVIDER_SITE_OTHER): Payer: 59 | Admitting: Physician Assistant

## 2021-04-14 ENCOUNTER — Encounter: Payer: Self-pay | Admitting: Physician Assistant

## 2021-04-14 ENCOUNTER — Other Ambulatory Visit: Payer: Self-pay

## 2021-04-14 VITALS — BP 118/68 | HR 60 | Ht 63.0 in | Wt 260.8 lb

## 2021-04-14 DIAGNOSIS — E1169 Type 2 diabetes mellitus with other specified complication: Secondary | ICD-10-CM

## 2021-04-14 DIAGNOSIS — G4733 Obstructive sleep apnea (adult) (pediatric): Secondary | ICD-10-CM

## 2021-04-14 DIAGNOSIS — I480 Paroxysmal atrial fibrillation: Secondary | ICD-10-CM

## 2021-04-14 DIAGNOSIS — I10 Essential (primary) hypertension: Secondary | ICD-10-CM

## 2021-04-14 DIAGNOSIS — E785 Hyperlipidemia, unspecified: Secondary | ICD-10-CM

## 2021-04-14 DIAGNOSIS — I4510 Unspecified right bundle-branch block: Secondary | ICD-10-CM

## 2021-04-14 NOTE — Patient Instructions (Addendum)
Medication Instructions:  Your physician recommends that you continue on your current medications as directed. Please refer to the Current Medication list given to you today.    Call me back with Lisinopril/HCTZ dose      *If you need a refill on your cardiac medications before your next appointment, please call your pharmacy*   Lab Work:  CMET, Fasting lipids, CBC in 3 weeks  If you have labs (blood work) drawn today and your tests are completely normal, you will receive your results only by: [facility] (if you have MyChart) OR A paper copy in the mail If you have any lab test that is abnormal or we need to change your treatment, we will call you to review the results.   Testing/Procedures: Your physician has recommended that you have a sleep study. This test records several body functions during sleep, including: brain activity, eye movement, oxygen and carbon dioxide blood levels, heart rate and rhythm, breathing rate and rhythm, the flow of air through your mouth and nose, snoring, body muscle movements, and chest and belly movement.    Follow-Up: At [facility], you and your health needs are our priority.  As part of our continuing mission to provide you with exceptional heart care, we have created designated Provider Care Teams.  These Care Teams include your primary Cardiologist (physician) and Advanced Practice Providers (APPs -  Physician Assistants and Nurse Practitioners) who all work together to provide you with the care you need, when you need it.  We recommend signing up for the patient portal called "MyChart".  Sign up information is provided on this After Visit Summary.  MyChart is used to connect with patients for Virtual Visits (Telemedicine).  Patients are able to view lab/test results, encounter notes, upcoming appointments, etc.  Non-urgent messages can be sent to your provider as well.   To learn more about what you can do with MyChart, go to  [email or web address].    Your next appointment:   3 month(s)  The format for your next appointment:   In Person  Provider:   Minus Breeding, MD   Other Instructions  Eliquis 5 mg  #14 lot [id number], exp 05/2023

## 2021-05-06 ENCOUNTER — Other Ambulatory Visit: Payer: Self-pay

## 2021-05-06 ENCOUNTER — Other Ambulatory Visit (HOSPITAL_COMMUNITY)
Admission: RE | Admit: 2021-05-06 | Discharge: 2021-05-06 | Disposition: A | Discharge status: Home / Self Care | Payer: 59 | Source: Ambulatory Visit | Attending: Physician Assistant | Admitting: Physician Assistant

## 2021-05-06 ENCOUNTER — Telehealth: Payer: Self-pay

## 2021-05-06 DIAGNOSIS — I480 Paroxysmal atrial fibrillation: Secondary | ICD-10-CM | POA: Insufficient documentation

## 2021-05-06 DIAGNOSIS — I10 Essential (primary) hypertension: Secondary | ICD-10-CM | POA: Insufficient documentation

## 2021-05-06 DIAGNOSIS — E785 Hyperlipidemia, unspecified: Secondary | ICD-10-CM | POA: Insufficient documentation

## 2021-05-06 DIAGNOSIS — E1169 Type 2 diabetes mellitus with other specified complication: Secondary | ICD-10-CM | POA: Insufficient documentation

## 2021-05-06 LAB — COMPREHENSIVE METABOLIC PANEL
ALT: 26 U/L (ref 0–44)
AST: 24 U/L (ref 15–41)
Albumin: 3.7 g/dL (ref 3.5–5.0)
Alkaline Phosphatase: 46 U/L (ref 38–126)
Anion gap: 6 (ref 5–15)
BUN: 28 mg/dL — ABNORMAL HIGH (ref 6–20)
CO2: 26 mmol/L (ref 22–32)
Calcium: 9.5 mg/dL (ref 8.9–10.3)
Chloride: 106 mmol/L (ref 98–111)
Creatinine, Ser: 1.04 mg/dL — ABNORMAL HIGH (ref 0.44–1.00)
GFR, Estimated: >60 mL/min (ref >60)
Glucose, Bld: 214 mg/dL — ABNORMAL HIGH (ref 70–99)
Potassium: 4 mmol/L (ref 3.5–5.1)
Sodium: 138 mmol/L (ref 135–145)
Total Bilirubin: 0.8 mg/dL (ref 0.3–1.2)
Total Protein: 6.8 g/dL (ref 6.5–8.1)

## 2021-05-06 LAB — LIPID PANEL
Cholesterol: 142 mg/dL (ref 0–200)
HDL: 48 mg/dL (ref >40)
LDL Cholesterol: 76 mg/dL (ref 0–99)
Total CHOL/HDL Ratio: 3 RATIO
Triglycerides: 89 mg/dL (ref <150)
VLDL: 18 mg/dL (ref 0–40)

## 2021-05-06 LAB — CBC
HCT: 43.6 % (ref 36.0–46.0)
Hemoglobin: 14.1 g/dL (ref 12.0–15.0)
MCH: 29 pg (ref 26.0–34.0)
MCHC: 32.3 g/dL (ref 30.0–36.0)
MCV: 89.5 fL (ref 80.0–100.0)
Platelets: 241 10*3/uL (ref 150–400)
RBC: 4.87 MIL/uL (ref 3.87–5.11)
RDW: 14.1 % (ref 11.5–15.5)
WBC: 8.3 10*3/uL (ref 4.0–10.5)
nRBC: 0 % (ref 0.0–0.2)

## 2021-05-06 NOTE — Telephone Encounter (Signed)
Patient notified and verbalized understanding. Pt had no questions or concerns at this time 

## 2021-05-06 NOTE — Telephone Encounter (Signed)
-----   Message from Imogene Burn, PA-C sent at 05/06/2021 10:05 AM EDT ----- LDL 76 near goal of 70, kidney function mildly elevated. Make sure she drinks enough water and staying hydrated. No changes. thanks

## 2021-06-09 ENCOUNTER — Telehealth: Payer: 59 | Admitting: Nurse Practitioner

## 2021-06-09 ENCOUNTER — Encounter: Payer: Self-pay | Admitting: Nurse Practitioner

## 2021-06-09 DIAGNOSIS — R531 Weakness: Secondary | ICD-10-CM

## 2021-06-09 NOTE — Patient Instructions (Signed)
Call Cardiology for urgent appointment If unable to be seen in the next 48 hours or with any worsening symptoms report to UC or ED as discussed

## 2021-06-09 NOTE — Progress Notes (Signed)
Virtual Visit Consent   Kathryn Merritt, you are scheduled for a virtual visit with a [facility] provider today.     Just as with appointments in the office, your consent must be obtained to participate.  Your consent will be active for this visit and any virtual visit you may have with one of our providers in the next 365 days.     If you have a MyChart account, a copy of this consent can be sent to you electronically.  All virtual visits are billed to your insurance company just like a traditional visit in the office.    As this is a virtual visit, video technology does not allow for your provider to perform a traditional examination.  This may limit your provider's ability to fully assess your condition.  If your provider identifies any concerns that need to be evaluated in person or the need to arrange testing (such as labs, EKG, etc.), we will make arrangements to do so.     Although advances in technology are sophisticated, we cannot ensure that it will always work on either your end or our end.  If the connection with a video visit is poor, the visit may have to be switched to a telephone visit.  With either a video or telephone visit, we are not always able to ensure that we have a secure connection.     I need to obtain your verbal consent now.   Are you willing to proceed with your visit today?    Kathryn Merritt has provided verbal consent on 06/09/2021 for a virtual visit (video or telephone).   Apolonio Schneiders, FNP   Date: 06/09/2021 12:51 PM   Virtual Visit via Video Note   I, Apolonio Schneiders, connected with  Kathryn Merritt  ([id number], 59-Aug-1962) on 06/09/21 at  1:00 PM EDT by a video-enabled telemedicine application and verified that I am speaking with the correct person using two identifiers.  Location: Patient: Virtual Visit Location Patient: Home Provider: Virtual Visit Location Provider: Office/Clinic   I discussed the limitations of evaluation and management by telemedicine  and the availability of in person appointments. The patient expressed understanding and agreed to proceed.    History of Present Illness: Kathryn Merritt is a 60 y.o. who identifies as a female who was assigned female at birth, and is being seen today with complaints of weakness and fatigue that has been present intermittently since June. She was hospitalized in June for new onset A-fib with RVR and was started on medication at that time.     She has been checking her blood pressure at home and has been stable, She does have a history of bradycardia pulse has been 50 this was prior to diagnosis with a-fib  She has had a f/u with cardiology one month that felt her fatigue was due to high blood sugar levels  Has an appointment with new PCP to establish care next week. She has not been under the care of a regular doctor in the past few years.   She also has a sore throat today that started yesterday with some nausea denies fever. Took a home COVID test at time of call and it was negative.  Denies a history of COVID  She has been vaccinated and boosted.   Problems:  Patient Active Problem List   Diagnosis Date Noted   Atrial fibrillation with RVR ([place]) 03/18/2021   Obesity, Class III, BMI 40-49.9 (morbid obesity) ([place]) 03/18/2021  Uncontrolled type 2 diabetes mellitus with hyperglycemia, without long-term current use of insulin ([place]) 03/18/2021   BRADYCARDIA 01/16/2010   FATIGUE 01/16/2010   EDEMA 01/16/2010    Allergies: No Known Allergies Medications:  Current Outpatient Medications:    amLODipine (NORVASC) 10 MG tablet, Take 1 tablet (10 mg total) by mouth daily., Disp: 90 tablet, Rfl: 3   apixaban (ELIQUIS) 5 MG TABS tablet, Take 1 tablet (5 mg total) by mouth 2 (two) times daily., Disp: 60 tablet, Rfl: 1   atorvastatin (LIPITOR) 20 MG tablet, Take 1 tablet (20 mg total) by mouth daily with supper., Disp: 30 tablet, Rfl: 1   glimepiride (AMARYL) 2 MG tablet, Take 2 mg by mouth 2  (two) times daily., Disp: , Rfl:    ibuprofen (ADVIL,MOTRIN) 200 MG tablet, Take 800 mg by mouth every 6 (six) hours as needed for pain. (Patient not taking: Reported on 04/14/2021), Disp: , Rfl:    JANUMET 50-1000 MG tablet, Take 1 tablet by mouth 2 (two) times daily., Disp: , Rfl:    lisinopril-hydrochlorothiazide (PRINZIDE,ZESTORETIC) 20-12.5 MG tablet, Take 1 tablet by mouth daily., Disp: 30 tablet, Rfl: 0   metoprolol succinate (TOPROL XL) 50 MG 24 hr tablet, Take 1 tablet (50 mg total) by mouth daily. Take with or immediately following a meal., Disp: 90 tablet, Rfl: 3   omeprazole (PRILOSEC) 20 MG capsule, Take 20 mg by mouth daily. (Patient not taking: Reported on 04/14/2021), Disp: , Rfl:   Observations/Objective: Patient is well-developed, well-nourished in no acute distress.  Resting comfortably at home.  Head is normocephalic, atraumatic.  No labored breathing.  Speech is clear and coherent with logical content.  Patient is alert and oriented at baseline.    Assessment and Plan: 1. Weakness Patient will call cardiology to see if she can be seen today or tomorrow Will otherwise got to [facility] care that is near her home  Call 911 with any acutely worsening symptoms as discussed     Follow Up Instructions: I discussed the assessment and treatment plan with the patient. The patient was provided an opportunity to ask questions and all were answered. The patient agreed with the plan and demonstrated an understanding of the instructions.  A copy of instructions were sent to the patient via MyChart.  The patient was advised to call back or seek an in-person evaluation if the symptoms worsen or if the condition fails to improve as anticipated.  Time:  I spent 15 minutes with the patient via telehealth technology discussing the above problems/concerns.    Apolonio Schneiders, FNP

## 2021-06-11 ENCOUNTER — Encounter (HOSPITAL_COMMUNITY): Payer: Self-pay | Admitting: Emergency Medicine

## 2021-06-11 ENCOUNTER — Emergency Department (HOSPITAL_COMMUNITY)
Admission: EM | Admit: 2021-06-11 | Discharge: 2021-06-11 | Disposition: A | Discharge status: Home / Self Care | Payer: 59 | Attending: Emergency Medicine | Admitting: Emergency Medicine

## 2021-06-11 ENCOUNTER — Encounter: Payer: Self-pay | Admitting: Emergency Medicine

## 2021-06-11 ENCOUNTER — Other Ambulatory Visit: Payer: Self-pay

## 2021-06-11 ENCOUNTER — Ambulatory Visit
Admission: EM | Admit: 2021-06-11 | Discharge: 2021-06-11 | Disposition: A | Discharge status: Home / Self Care | Payer: 59 | Attending: Emergency Medicine | Admitting: Emergency Medicine

## 2021-06-11 DIAGNOSIS — Z79899 Other long term (current) drug therapy: Secondary | ICD-10-CM | POA: Insufficient documentation

## 2021-06-11 DIAGNOSIS — R5383 Other fatigue: Secondary | ICD-10-CM

## 2021-06-11 DIAGNOSIS — N3000 Acute cystitis without hematuria: Secondary | ICD-10-CM

## 2021-06-11 DIAGNOSIS — R030 Elevated blood-pressure reading, without diagnosis of hypertension: Secondary | ICD-10-CM

## 2021-06-11 DIAGNOSIS — I480 Paroxysmal atrial fibrillation: Secondary | ICD-10-CM | POA: Diagnosis not present

## 2021-06-11 DIAGNOSIS — I10 Essential (primary) hypertension: Secondary | ICD-10-CM | POA: Diagnosis not present

## 2021-06-11 DIAGNOSIS — E119 Type 2 diabetes mellitus without complications: Secondary | ICD-10-CM | POA: Insufficient documentation

## 2021-06-11 DIAGNOSIS — Z7901 Long term (current) use of anticoagulants: Secondary | ICD-10-CM | POA: Diagnosis not present

## 2021-06-11 DIAGNOSIS — M791 Myalgia, unspecified site: Secondary | ICD-10-CM | POA: Diagnosis not present

## 2021-06-11 DIAGNOSIS — Z20822 Contact with and (suspected) exposure to covid-19: Secondary | ICD-10-CM | POA: Insufficient documentation

## 2021-06-11 DIAGNOSIS — R531 Weakness: Secondary | ICD-10-CM | POA: Diagnosis not present

## 2021-06-11 DIAGNOSIS — Z7984 Long term (current) use of oral hypoglycemic drugs: Secondary | ICD-10-CM | POA: Diagnosis not present

## 2021-06-11 DIAGNOSIS — R000 Tachycardia, unspecified: Secondary | ICD-10-CM | POA: Diagnosis not present

## 2021-06-11 DIAGNOSIS — I4510 Unspecified right bundle-branch block: Secondary | ICD-10-CM

## 2021-06-11 LAB — CBC WITH DIFFERENTIAL/PLATELET
Abs Immature Granulocytes: 0.02 10*3/uL (ref 0.00–0.07)
Basophils Absolute: 0.1 10*3/uL (ref 0.0–0.1)
Basophils Relative: 1 %
Eosinophils Absolute: 0.3 10*3/uL (ref 0.0–0.5)
Eosinophils Relative: 4 %
HCT: 39.2 % (ref 36.0–46.0)
Hemoglobin: 13.1 g/dL (ref 12.0–15.0)
Immature Granulocytes: 0 %
Lymphocytes Relative: 30 %
Lymphs Abs: 2.2 10*3/uL (ref 0.7–4.0)
MCH: 30.5 pg (ref 26.0–34.0)
MCHC: 33.4 g/dL (ref 30.0–36.0)
MCV: 91.4 fL (ref 80.0–100.0)
Monocytes Absolute: 0.5 10*3/uL (ref 0.1–1.0)
Monocytes Relative: 7 %
Neutro Abs: 4.2 10*3/uL (ref 1.7–7.7)
Neutrophils Relative %: 58 %
Platelets: 221 10*3/uL (ref 150–400)
RBC: 4.29 MIL/uL (ref 3.87–5.11)
RDW: 14.7 % (ref 11.5–15.5)
WBC: 7.3 10*3/uL (ref 4.0–10.5)
nRBC: 0 % (ref 0.0–0.2)

## 2021-06-11 LAB — RESP PANEL BY RT-PCR (FLU A&B, COVID) ARPGX2
Influenza A by PCR: NEGATIVE
Influenza B by PCR: NEGATIVE
SARS Coronavirus 2 by RT PCR: NEGATIVE

## 2021-06-11 LAB — URINALYSIS, MICROSCOPIC (REFLEX): WBC, UA: NONE SEEN WBC/hpf (ref 0–5)

## 2021-06-11 LAB — URINALYSIS, ROUTINE W REFLEX MICROSCOPIC
Bilirubin Urine: NEGATIVE
Glucose, UA: 250 mg/dL — AB
Ketones, ur: NEGATIVE mg/dL
Leukocytes,Ua: NEGATIVE
Nitrite: POSITIVE — AB
Protein, ur: 30 mg/dL — AB
Specific Gravity, Urine: 1.025 (ref 1.005–1.030)
pH: 5.5 (ref 5.0–8.0)

## 2021-06-11 LAB — CK: Total CK: 68 U/L (ref 38–234)

## 2021-06-11 LAB — BASIC METABOLIC PANEL
Anion gap: 8 (ref 5–15)
BUN: 22 mg/dL — ABNORMAL HIGH (ref 6–20)
CO2: 24 mmol/L (ref 22–32)
Calcium: 9.4 mg/dL (ref 8.9–10.3)
Chloride: 105 mmol/L (ref 98–111)
Creatinine, Ser: 1.04 mg/dL — ABNORMAL HIGH (ref 0.44–1.00)
GFR, Estimated: >60 mL/min (ref >60)
Glucose, Bld: 192 mg/dL — ABNORMAL HIGH (ref 70–99)
Potassium: 3.8 mmol/L (ref 3.5–5.1)
Sodium: 137 mmol/L (ref 135–145)

## 2021-06-11 MED ORDER — CEPHALEXIN 500 MG PO CAPS: ORAL_CAPSULE | ORAL | 0 refills | Status: DC

## 2021-06-11 MED ORDER — SODIUM CHLORIDE 0.9 % IV SOLN
1.0000 g | Freq: Once | INTRAVENOUS | Status: AC
  Administered 2021-06-11: 1 g via INTRAVENOUS
  Filled 2021-06-11: qty 10

## 2021-06-11 NOTE — ED Provider Notes (Signed)
[facility] EMERGENCY DEPARTMENT Provider Note   CSN: [id number] Arrival date & time: 06/11/21  6659     History Chief Complaint  Patient presents with   Fatigue    Kathryn Merritt is a 60 y.o. female who  has a past medical history of Chronic back pain, Diabetes mellitus, Edema leg, Hyperlipidemia, Hypertension, Joint pain, Kidney stones, Other malaise and fatigue, PAF (paroxysmal atrial fibrillation) ([place]), RBBB, Sinus bradycardia, and Sleep apnea. She was sent form the UC for fatigue and abnormal ecg. Patient reports that she has had generalized weakness and fatigue ever since she was admitted and discharged from the hospital in June with Afib. She reports that her sxs have been worse over the past three days. She denies sob, cp, palpitations, fever, chills, cough, melena or hematochezia. She denies lightheadedness. She reports that she was started on metoprolol and atorvastatin when she was admitted in June. She is also currently on eliquis and is compliant with her medications. She denies urinary sxs and had a negative at home covid test. Patient has NOT taken any of her medications this morning.  HPI     Past Medical History:  Diagnosis Date   Chronic back pain    Diabetes mellitus    Edema leg    Hyperlipidemia    Hypertension    Joint pain    Kidney stones    Other malaise and fatigue    PAF (paroxysmal atrial fibrillation) ([place])    a. new diagnosis in 02/2021 --> s/p DCCV on 03/18/2021   RBBB    Sinus bradycardia    Sleep apnea     Patient Active Problem List   Diagnosis Date Noted   Atrial fibrillation with RVR ([place]) 03/18/2021   Obesity, Class III, BMI 40-49.9 (morbid obesity) ([place]) 03/18/2021   Uncontrolled type 2 diabetes mellitus with hyperglycemia, without long-term current use of insulin ([place]) 03/18/2021   BRADYCARDIA 01/16/2010   FATIGUE 01/16/2010   EDEMA 01/16/2010    Past Surgical History:  Procedure Laterality Date   CARDIOVERSION N/A 03/18/2021    Procedure: CARDIOVERSION;  Surgeon: Satira Sark, MD;  Location: [facility];  Service: Cardiovascular;  Laterality: N/A;   CESAREAN SECTION     times 2   LITHOTRIPSY       OB History   No obstetric history on file.     Family History  Problem Relation Age of Onset   Hypertension Father     Social History   Tobacco Use   Smoking status: Never   Smokeless tobacco: Never  Vaping Use   Vaping Use: Never used  Substance Use Topics   Alcohol use: No   Drug use: No    Home Medications Prior to Admission medications   Medication Sig Start Date End Date Taking? Authorizing Provider  amLODipine (NORVASC) 10 MG tablet Take 1 tablet (10 mg total) by mouth daily. 03/25/21 06/23/21  Josue Hector, MD  apixaban (ELIQUIS) 5 MG TABS tablet Take 1 tablet (5 mg total) by mouth 2 (two) times daily. 03/19/21   Orson Eva, MD  atorvastatin (LIPITOR) 20 MG tablet Take 1 tablet (20 mg total) by mouth daily with supper. 03/19/21   Orson Eva, MD  glimepiride (AMARYL) 2 MG tablet Take 2 mg by mouth 2 (two) times daily. 01/08/20   [provider]  ibuprofen (ADVIL,MOTRIN) 200 MG tablet Take 800 mg by mouth every 6 (six) hours as needed for pain. Patient not taking: Reported on 04/14/2021    [provider]  JANUMET 50-1000 MG tablet Take 1 tablet by mouth 2 (two) times daily. 01/08/20   [provider]  lisinopril-hydrochlorothiazide (PRINZIDE,ZESTORETIC) 20-12.5 MG tablet Take 1 tablet by mouth daily. 06/09/18   Long, Wonda Olds, MD  metoprolol succinate (TOPROL XL) 50 MG 24 hr tablet Take 1 tablet (50 mg total) by mouth daily. Take with or immediately following a meal. 03/25/21   Josue Hector, MD  omeprazole (PRILOSEC) 20 MG capsule Take 20 mg by mouth daily. Patient not taking: Reported on 04/14/2021 02/14/20   [provider]    Allergies    Patient has no known allergies.  Review of Systems   Review of Systems Ten systems reviewed and are negative for acute  change, except as noted in the HPI.   Physical Exam Updated Vital Signs BP (!) 155/81   Pulse 86   Temp 98.6 F (37 C)   Resp 17   Ht 5\' 3"  (1.6 m)   Wt 115.7 kg   SpO2 100%   BMI 45.17 kg/m   Physical Exam Vitals and nursing note reviewed.  Constitutional:      General: She is not in acute distress.    Appearance: She is well-developed. She is not diaphoretic.  HENT:     Head: Normocephalic and atraumatic.     Right Ear: External ear normal.     Left Ear: External ear normal.     Nose: Nose normal.     Mouth/Throat:     Mouth: Mucous membranes are moist.  Eyes:     General: No scleral icterus.    Conjunctiva/sclera: Conjunctivae normal.  Cardiovascular:     Rate and Rhythm: Normal rate and regular rhythm.     Heart sounds: Normal heart sounds. No murmur heard.   No friction rub. No gallop.  Pulmonary:     Effort: Pulmonary effort is normal. No respiratory distress.     Breath sounds: Normal breath sounds.  Abdominal:     General: Bowel sounds are normal. There is no distension.     Palpations: Abdomen is soft. There is no mass.     Tenderness: There is no abdominal tenderness. There is no guarding.  Musculoskeletal:     Cervical back: Normal range of motion.     Right lower leg: No edema.     Left lower leg: No edema.  Skin:    General: Skin is warm and dry.  Neurological:     Mental Status: She is alert and oriented to person, place, and time.  Psychiatric:        Behavior: Behavior normal.    ED Results / Procedures / Treatments   Labs (all labs ordered are listed, but only abnormal results are displayed) Labs Reviewed  RESP PANEL BY RT-PCR (FLU A&B, COVID) ARPGX2  CBC WITH DIFFERENTIAL/PLATELET  BASIC METABOLIC PANEL  URINALYSIS, ROUTINE W REFLEX MICROSCOPIC  CK    EKG EKG Interpretation  Date/Time:  Thursday June 11 2021 09:39:15 EDT Ventricular Rate:  84 PR Interval:  144 QRS Duration: 144 QT Interval:  407 QTC Calculation: 482 R  Axis:   202 Text Interpretation: Sinus rhythm Right bundle branch block Baseline wander in lead(s) I similar to prior tracing Confirmed by Wynona Dove ([id number]) on 06/11/2021 9:42:48 AM  Radiology No results found.  Procedures Procedures   Medications Ordered in ED Medications - No data to display  ED Course  I have reviewed the triage vital signs and the nursing notes.  Pertinent  labs & imaging results that were available during my care of the patient were reviewed by me and considered in my medical decision making (see chart for details).    MDM Rules/Calculators/A&P                          Is a 60 year old female who presents for several months of weakness, myalgias and fatigue worse over the past 3 days.  She was sent in by urgent care for abnormal EKG.  I reviewed today's EKG which shows a right bundle branch block which appears on changed from previous tracings. The differential diagnosis of weakness includes but is not limited to neurologic causes (GBS, myasthenia gravis, CVA, MS, ALS, transverse myelitis, spinal cord injury, CVA, botulism, ) and other causes: ACS, Arrhythmia, syncope, orthostatic hypotension, sepsis, hypoglycemia, electrolyte disturbance, hypothyroidism, respiratory failure, symptomatic anemia, dehydration, heat injury, polypharmacy, malignancy. I ordered and reviewed labs that included CBC which shows no abnormalities, BMP which shows slightly elevated BUN and creatinine of insignificant value, normal GFR and blood glucose of 192. Urinalysis appears to show infection with many bacteria and positive nitrites.  Respiratory panel is negative for COVID or influenza  Patient reports that her symptoms began after she was hospitalized for A. fib and started on beta-blockers and a statin.  I suspect that her continued fatigue and myalgias may be related to be used to medications.  I have discussed that these medications show significant benefit in reducing mortality due to  cardiac disease however should she find her quality of life affected to the point where she feels it outweighs the benefits she should discuss this with her prescribing provider.  She does not appear to have any evidence of myositis or rhabdomyolysis as her CK is negative. Patient treated in the emergency department with Rocephin.  Will discharge with Keflex for treatment of UTI.  She is advised to follow closely with her primary care physician.  Discussed return precautions.  She appears otherwise appropriate for discharge at this time   Final Clinical Impression(s) / ED Diagnoses Final diagnoses:  None    Rx / DC Orders ED Discharge Orders     None        Margarita Mail, PA-C 06/12/21 [place], [place], DO 06/12/21 [id number]

## 2021-06-11 NOTE — ED Triage Notes (Signed)
Not feeling well since Tuesday.  States heart rate was up yesterday.  States she is normally bradycardic.  States BP was up this morning.  States she feels shaky.  States she had a virtual visit on Tuesday and was told she needed to have an EKG done.

## 2021-06-11 NOTE — ED Notes (Signed)
PT reports feeling generally weak and with episodes of all over pain periodically causing her to have to sit down at time until it subsides. This started in June per pt after Afib episode. She states she gets dizzy at times on standing and reports bradycardia baseline in the low 50s usually. She also states she gets a periodic "thumping" in her left lower side above her left hip. She states it almost feels like a spasm and goes away on its own - started a few weeks ago.

## 2021-06-11 NOTE — ED Provider Notes (Signed)
[facility]   [id number] 06/11/21 Arrival Time: 0809   CC: elevated blood pressure and elevated heart rate  SUBJECTIVE:  Kathryn Merritt is a 60 y.o. female who presents with complaint of fatigue, elevated HR (99 bpm), and elevated blood pressure (161/96) that occurred 2 days.  Denies a precipitating event, trauma. Denies chest pain.  Denies alleviating factors.  Denies aggravating factors.  Reports history of a fib and was hospitalized.  Complains of lightheadedness/ dizziness, palpitations last night, nausea.  Denies fever, chills, SOB, vomiting, abdominal pain, changes in bowel or bladder habits, diaphoresis, numbness/tingling in extremities, peripheral edema, or anxiety.    Denies SOB, calf pain or swelling, recent long travel, recent surgery, malignancy, tobacco use, hormone use, or previous blood clot   ROS: As per HPI.  All other pertinent ROS negative.    Past Medical History:  Diagnosis Date   Chronic back pain    Diabetes mellitus    Edema leg    Hyperlipidemia    Hypertension    Joint pain    Kidney stones    Other malaise and fatigue    PAF (paroxysmal atrial fibrillation) ([place])    a. new diagnosis in 02/2021 --> s/p DCCV on 03/18/2021   RBBB    Sinus bradycardia    Sleep apnea    Past Surgical History:  Procedure Laterality Date   CARDIOVERSION N/A 03/18/2021   Procedure: CARDIOVERSION;  Surgeon: Satira Sark, MD;  Location: [facility];  Service: Cardiovascular;  Laterality: N/A;   CESAREAN SECTION     times 2   LITHOTRIPSY     No Known Allergies No current facility-administered medications on file prior to encounter.   Current Outpatient Medications on File Prior to Encounter  Medication Sig Dispense Refill   amLODipine (NORVASC) 10 MG tablet Take 1 tablet (10 mg total) by mouth daily. 90 tablet 3   apixaban (ELIQUIS) 5 MG TABS tablet Take 1 tablet (5 mg total) by mouth 2 (two) times daily. 60 tablet 1   atorvastatin (LIPITOR) 20 MG tablet Take 1  tablet (20 mg total) by mouth daily with supper. 30 tablet 1   glimepiride (AMARYL) 2 MG tablet Take 2 mg by mouth 2 (two) times daily.     ibuprofen (ADVIL,MOTRIN) 200 MG tablet Take 800 mg by mouth every 6 (six) hours as needed for pain. (Patient not taking: Reported on 04/14/2021)     JANUMET 50-1000 MG tablet Take 1 tablet by mouth 2 (two) times daily.     lisinopril-hydrochlorothiazide (PRINZIDE,ZESTORETIC) 20-12.5 MG tablet Take 1 tablet by mouth daily. 30 tablet 0   metoprolol succinate (TOPROL XL) 50 MG 24 hr tablet Take 1 tablet (50 mg total) by mouth daily. Take with or immediately following a meal. 90 tablet 3   omeprazole (PRILOSEC) 20 MG capsule Take 20 mg by mouth daily. (Patient not taking: Reported on 04/14/2021)     Social History   Socioeconomic History   Marital status: Married    Spouse name: Not on file   Number of children: Not on file   Years of education: Not on file   Highest education level: Not on file  Occupational History   Not on file  Tobacco Use   Smoking status: Never   Smokeless tobacco: Never  Vaping Use   Vaping Use: Never used  Substance and Sexual Activity   Alcohol use: No   Drug use: No   Sexual activity: Not on file  Other Topics Concern  Not on file  Social History Narrative   Not on file   Social Determinants of Health   Financial Resource Strain: Not on file  Food Insecurity: Not on file  Transportation Needs: Not on file  Physical Activity: Not on file  Stress: Not on file  Social Connections: Not on file  Intimate Partner Violence: Not on file   Family History  Problem Relation Age of Onset   Hypertension Father      OBJECTIVE:  Vitals:   06/11/21 0821  BP: (!) 161/96  Pulse: 99  Resp: 18  Temp: 98.4 F (36.9 C)  TempSrc: Oral  SpO2: 96%    General appearance: alert; no distress Eyes: PERRLA; EOMI; conjunctiva normal HENT: normocephalic; atraumatic Neck: supple, no carotid bruits Lungs: clear to auscultation  bilaterally without adventitious breath sounds Heart: regular rate and rhythm.  Clear S1 and S2 without rubs, gallops, or murmur. Extremities: no cyanosis or edema; symmetrical with no gross deformities Skin: warm and dry Psychological: alert and cooperative; normal mood and affect  ECG: Orders placed or performed during the hospital encounter of 06/11/21   ED EKG   ED EKG    EKG normal sinus rhythm.  RBBB seen.  This is stable from past EKG.  New findings of fascicular block.     ASSESSMENT & PLAN:  1. Heart rate fast   2. Elevated blood pressure reading   3. Other fatigue     No orders of the defined types were placed in this encounter.  Symptoms and abnormal EKG concerning Recommending further evaluation and management in the ED     Lestine Box, PA-C 06/11/21 0901

## 2021-06-11 NOTE — ED Triage Notes (Signed)
Pt c/o feeling generally weak and tired since Tuesday. Some nausea. Sent to Ed from [facility] for abnormal EKG.

## 2021-06-11 NOTE — Discharge Instructions (Signed)
Contact a health care provider if: Your symptoms do not get better after 1-2 days. Your symptoms go away and then return. Get help right away if: You have severe pain in your back or your lower abdomen. You have a fever or chills. You have nausea or vomiting. 

## 2021-06-14 LAB — URINE CULTURE
Culture: 60000 — AB
Special Requests: NORMAL

## 2021-06-15 ENCOUNTER — Telehealth: Payer: Self-pay | Admitting: *Deleted

## 2021-06-15 NOTE — Telephone Encounter (Signed)
Post ED Visit - Positive Culture Follow-up  Culture report reviewed by antimicrobial stewardship pharmacist: [place] Team []  Elenor Quinones, Pharm.D. []  Heide Guile, Pharm.D., BCPS AQ-ID []  Parks Neptune, Pharm.D., BCPS []  Alycia Rossetti, Pharm.D., BCPS []  [employer], [place].D., BCPS, AAHIVP []  Legrand Como, Pharm.D., BCPS, AAHIVP []  Salome Arnt, PharmD, BCPS []  Johnnette Gourd, PharmD, BCPS []  Hughes Better, PharmD, BCPS []  Leeroy Cha, PharmD []  Laqueta Linden, PharmD, BCPS []  Albertina Parr, PharmD  [place] Team []  Leodis Sias, PharmD []  Lindell Spar, PharmD []  Royetta Asal, PharmD []  Graylin Shiver, Rph []  Rema Fendt) Glennon Mac, PharmD []  Arlyn Dunning, PharmD []  Netta Cedars, PharmD []  Dia Sitter, PharmD []  Leone Haven, PharmD []  Gretta Arab, PharmD []  Theodis Shove, PharmD []  Peggyann Juba, PharmD []  Reuel Boom, PharmD   Positive urine culture Treated with Cephalexin, organism sensitive to the same and no further patient follow-up is required at this time.  Joetta Manners, PharmD  Harlon Flor Talley 06/15/2021, 9:08 AM

## 2021-06-18 ENCOUNTER — Encounter: Payer: Self-pay | Admitting: Nurse Practitioner

## 2021-06-18 ENCOUNTER — Ambulatory Visit (INDEPENDENT_AMBULATORY_CARE_PROVIDER_SITE_OTHER): Payer: 59 | Admitting: Nurse Practitioner

## 2021-06-18 ENCOUNTER — Other Ambulatory Visit: Payer: Self-pay

## 2021-06-18 VITALS — BP 142/83 | HR 63 | Temp 97.9°F | Ht 63.0 in | Wt 264.1 lb

## 2021-06-18 DIAGNOSIS — Z7689 Persons encountering health services in other specified circumstances: Secondary | ICD-10-CM

## 2021-06-18 DIAGNOSIS — I10 Essential (primary) hypertension: Secondary | ICD-10-CM

## 2021-06-18 DIAGNOSIS — E1165 Type 2 diabetes mellitus with hyperglycemia: Secondary | ICD-10-CM

## 2021-06-18 DIAGNOSIS — I480 Paroxysmal atrial fibrillation: Secondary | ICD-10-CM

## 2021-06-18 DIAGNOSIS — E66813 Obesity, class 3: Secondary | ICD-10-CM

## 2021-06-18 DIAGNOSIS — E785 Hyperlipidemia, unspecified: Secondary | ICD-10-CM

## 2021-06-18 DIAGNOSIS — E1169 Type 2 diabetes mellitus with other specified complication: Secondary | ICD-10-CM | POA: Insufficient documentation

## 2021-06-18 MED ORDER — ROSUVASTATIN CALCIUM 10 MG PO TABS: 10.0000 mg | ORAL_TABLET | Freq: Every day | ORAL | 3 refills | Status: DC

## 2021-06-18 NOTE — Assessment & Plan Note (Addendum)
-  obtain records from health dept; no recent PCP

## 2021-06-18 NOTE — Progress Notes (Signed)
New Patient Office Visit  Subjective:  Patient ID: Kathryn Merritt, female    DOB: 1961-05-03  Age: 60 y.o. MRN: [id number]  CC:  Chief Complaint  Patient presents with   New Patient (Initial Visit)    Establish care blood sugar concerns atorvastatin causing muscle cramps and body aches not taking since last week and feels better     HPI Kathryn Merritt presents for new patient visit. Transferring care from the [facility]. Last physical was well over a year ago. Last labs with PCP were over a year ago as well.  She is followed by cardiology for paroxysmal a-fib, HTN, HLD with T2DM, and OSA.  She has had body aches since starting atorvastatin. She stopped taking atorvastatin, and he body aches have improved greatly.  She states she is a poorly controlled diabetic. She has been taking janumet and glyburide lately. She states that cost is usually an issue, but she has been getting her meds through the health department, so that has helped with adherence.   Past Medical History:  Diagnosis Date   Chronic back pain    Diabetes mellitus    Edema leg    Hyperlipidemia    Hypertension    Joint pain    Kidney stones    Other malaise and fatigue    PAF (paroxysmal atrial fibrillation) ([place])    a. new diagnosis in 02/2021 --> s/p DCCV on 03/18/2021   RBBB    Sinus bradycardia    Sleep apnea     Past Surgical History:  Procedure Laterality Date   CARDIOVERSION N/A 03/18/2021   Procedure: CARDIOVERSION;  Surgeon: Satira Sark, MD;  Location: [facility];  Service: Cardiovascular;  Laterality: N/A;   CESAREAN SECTION     times 2   INCISION AND DRAINAGE ABSCESS Right    axilla   LITHOTRIPSY      Family History  Problem Relation Age of Onset   Cancer Father    Hypertension Father    Cancer Brother    Heart disease Paternal Grandmother     Social History   Socioeconomic History   Marital status: Married    Spouse name: Not on file   Number of  children: 2   Years of education: Not on file   Highest education level: Not on file  Occupational History   Occupation: Occupational psychologist- Teacher  Tobacco Use   Smoking status: Never   Smokeless tobacco: Never  Vaping Use   Vaping Use: Never used  Substance and Sexual Activity   Alcohol use: No   Drug use: No   Sexual activity: Not Currently  Other Topics Concern   Not on file  Social History Narrative   Not on file   Social Determinants of Health   Financial Resource Strain: Not on file  Food Insecurity: Not on file  Transportation Needs: Not on file  Physical Activity: Not on file  Stress: Not on file  Social Connections: Not on file  Intimate Partner Violence: Not on file    ROS Review of Systems  Constitutional: Negative.   Respiratory: Negative.    Cardiovascular: Negative.   Musculoskeletal:  Positive for myalgias.  Psychiatric/Behavioral: Negative.     Objective:   Today's Vitals: BP (!) 142/83 (BP Location: Right Arm, Patient Position: Sitting, Cuff Size: Large)   Pulse 63   Temp 97.9 F (36.6 C) (Oral)   Ht _0  (1.6 m)   Wt 264 lb 1.3  oz (119.8 kg)   SpO2 95%   BMI 46.78 kg/m   Physical Exam Constitutional:      Appearance: Normal appearance. She is obese.  Cardiovascular:     Rate and Rhythm: Normal rate and regular rhythm.     Pulses: Normal pulses.     Heart sounds: Normal heart sounds.  Pulmonary:     Effort: Pulmonary effort is normal.     Breath sounds: Normal breath sounds.  Musculoskeletal:        General: Normal range of motion.  Neurological:     Mental Status: She is alert.  Psychiatric:        Mood and Affect: Mood normal.        Behavior: Behavior normal.        Thought Content: Thought content normal.        Judgment: Judgment normal.    Assessment & Plan:   Problem List Items Addressed This Visit       Cardiovascular and Mediastinum   Paroxysmal atrial fibrillation (HCC)    -on metoprolol for rate  control -on eliquis for anticoagulation      Relevant Medications   rosuvastatin (CRESTOR) 10 MG tablet   HTN (hypertension)    BP Readings from Last 3 Encounters:  06/18/21 (!) 142/83  06/11/21 138/74  06/11/21 (!) 161/96  -taking amlodipine and lisinopril-HCTZ currently      Relevant Medications   rosuvastatin (CRESTOR) 10 MG tablet   Other Relevant Orders   CBC with Differential/Platelet   CMP14+EGFR   Lipid Panel With LDL/HDL Ratio     Endocrine   Uncontrolled type 2 diabetes mellitus with hyperglycemia, without long-term current use of insulin (HCC)    -check A1c -may consider adding trulicity if M7E is elevated -on statin (had myalgias); on ACEi -taking glimipirid and janumet currently      Relevant Medications   rosuvastatin (CRESTOR) 10 MG tablet   Other Relevant Orders   CBC with Differential/Platelet   CMP14+EGFR   Lipid Panel With LDL/HDL Ratio   Hemoglobin A1c   Microalbumin / creatinine urine ratio     Other   Obesity, Class III, BMI 40-49.9 (morbid obesity) (HCC)    BMI Readings from Last 3 Encounters:  06/18/21 46.78 kg/m  06/11/21 45.17 kg/m  04/14/21 46.20 kg/m   Wt Readings from Last 3 Encounters:  06/18/21 264 lb 1.3 oz (119.8 kg)  06/11/21 255 lb (115.7 kg)  04/14/21 260 lb 12.8 oz (118.3 kg)  -consider trulicity or other GLP-1 as glycemic agent if A1c is elevated -decrease fried/fatty foods as well as sugary/high carb foods      Establishing care with new doctor, encounter for - Primary    -obtain records from health dept; no recent PCP      Relevant Orders   CBC with Differential/Platelet   CMP14+EGFR   Lipid Panel With LDL/HDL Ratio   Hemoglobin A1c   Microalbumin / creatinine urine ratio   Hyperlipidemia    -goal LDL < 70 -checking lipids with labs -swap from atorvastatin to rosuvastatin d/t myalgias      Relevant Medications   rosuvastatin (CRESTOR) 10 MG tablet   Other Relevant Orders   CBC with Differential/Platelet    CMP14+EGFR   Lipid Panel With LDL/HDL Ratio    Outpatient Encounter Medications as of 06/18/2021  Medication Sig   amLODipine (NORVASC) 10 MG tablet Take 1 tablet (10 mg total) by mouth daily.   apixaban (ELIQUIS) 5 MG TABS tablet Take 1 tablet (  5 mg total) by mouth 2 (two) times daily.   glimepiride (AMARYL) 2 MG tablet Take 2 mg by mouth 2 (two) times daily.   JANUMET 50-1000 MG tablet Take 1 tablet by mouth 2 (two) times daily.   lisinopril-hydrochlorothiazide (ZESTORETIC) 20-25 MG tablet Take 1 tablet by mouth daily.   metoprolol succinate (TOPROL XL) 50 MG 24 hr tablet Take 1 tablet (50 mg total) by mouth daily. Take with or immediately following a meal.   rosuvastatin (CRESTOR) 10 MG tablet Take 1 tablet (10 mg total) by mouth daily.   [DISCONTINUED] atorvastatin (LIPITOR) 20 MG tablet Take 1 tablet (20 mg total) by mouth daily with supper. (Patient not taking: Reported on 06/18/2021)   [DISCONTINUED] cephALEXin (KEFLEX) 500 MG capsule 2 caps po bid x 7 days (Patient not taking: Reported on 06/18/2021)   [DISCONTINUED] ibuprofen (ADVIL,MOTRIN) 200 MG tablet Take 800 mg by mouth every 6 (six) hours as needed for pain. (Patient not taking: No sig reported)   [DISCONTINUED] lisinopril-hydrochlorothiazide (PRINZIDE,ZESTORETIC) 20-12.5 MG tablet Take 1 tablet by mouth daily. (Patient not taking: Reported on 06/11/2021)   [DISCONTINUED] omeprazole (PRILOSEC) 20 MG capsule Take 20 mg by mouth daily. (Patient not taking: No sig reported)   No facility-administered encounter medications on file as of 06/18/2021.    Follow-up: Return in about 3 months (around 09/17/2021) for Physical Exam.   Noreene Larsson, NP

## 2021-06-18 NOTE — Assessment & Plan Note (Signed)
-  on metoprolol for rate control -on eliquis for anticoagulation

## 2021-06-18 NOTE — Assessment & Plan Note (Signed)
BMI Readings from Last 3 Encounters:  06/18/21 46.78 kg/m  06/11/21 45.17 kg/m  04/14/21 46.20 kg/m   Wt Readings from Last 3 Encounters:  06/18/21 264 lb 1.3 oz (119.8 kg)  06/11/21 255 lb (115.7 kg)  04/14/21 260 lb 12.8 oz (118.3 kg)   -consider trulicity or other GLP-1 as glycemic agent if A1c is elevated -decrease fried/fatty foods as well as sugary/high carb foods

## 2021-06-18 NOTE — Patient Instructions (Signed)
Please have fasting labs completed in the next week.

## 2021-06-18 NOTE — Assessment & Plan Note (Signed)
BP Readings from Last 3 Encounters:  06/18/21 (!) 142/83  06/11/21 138/74  06/11/21 (!) 161/96   -taking amlodipine and lisinopril-HCTZ currently

## 2021-06-18 NOTE — Assessment & Plan Note (Addendum)
-  check A1c -may consider adding trulicity if M2T is elevated -on statin (had myalgias); on ACEi -taking glimipirid and janumet currently

## 2021-06-18 NOTE — Assessment & Plan Note (Signed)
-  goal LDL < 70 -checking lipids with labs -swap from atorvastatin to rosuvastatin d/t myalgias

## 2021-06-24 ENCOUNTER — Other Ambulatory Visit: Payer: Self-pay | Admitting: Nurse Practitioner

## 2021-06-24 DIAGNOSIS — E1165 Type 2 diabetes mellitus with hyperglycemia: Secondary | ICD-10-CM

## 2021-06-24 MED ORDER — DAPAGLIFLOZIN PROPANEDIOL 10 MG PO TABS: 10.0000 mg | ORAL_TABLET | Freq: Every day | ORAL | 2 refills | Status: DC

## 2021-06-24 NOTE — Progress Notes (Signed)
A1c is elevated at 9.4%. I sent in Kamas to bring down blood sugar. Kidney funtion is a little low, so we have to get blood sugar under control to prevent further decline in renal function.

## 2021-06-25 ENCOUNTER — Telehealth: Payer: Self-pay | Admitting: Nurse Practitioner

## 2021-06-25 ENCOUNTER — Other Ambulatory Visit: Payer: Self-pay

## 2021-06-25 LAB — CMP14+EGFR
ALT: 23 IU/L (ref 0–32)
AST: 19 IU/L (ref 0–40)
Albumin/Globulin Ratio: 1.8 (ref 1.2–2.2)
Albumin: 4.1 g/dL (ref 3.8–4.9)
Alkaline Phosphatase: 58 IU/L (ref 44–121)
BUN/Creatinine Ratio: 22 (ref 9–23)
BUN: 28 mg/dL — ABNORMAL HIGH (ref 6–24)
Bilirubin Total: 0.4 mg/dL (ref 0.0–1.2)
CO2: 24 mmol/L (ref 20–29)
Calcium: 10.1 mg/dL (ref 8.7–10.2)
Chloride: 103 mmol/L (ref 96–106)
Creatinine, Ser: 1.28 mg/dL — ABNORMAL HIGH (ref 0.57–1.00)
Globulin, Total: 2.3 g/dL (ref 1.5–4.5)
Glucose: 184 mg/dL — ABNORMAL HIGH (ref 70–99)
Potassium: 4.5 mmol/L (ref 3.5–5.2)
Sodium: 139 mmol/L (ref 134–144)
Total Protein: 6.4 g/dL (ref 6.0–8.5)
eGFR: 48 mL/min/{1.73_m2} — ABNORMAL LOW (ref >59)

## 2021-06-25 LAB — LIPID PANEL WITH LDL/HDL RATIO
Cholesterol, Total: 140 mg/dL (ref 100–199)
HDL: 42 mg/dL (ref >39)
LDL Chol Calc (NIH): 74 mg/dL (ref 0–99)
LDL/HDL Ratio: 1.8 ratio (ref 0.0–3.2)
Triglycerides: 136 mg/dL (ref 0–149)
VLDL Cholesterol Cal: 24 mg/dL (ref 5–40)

## 2021-06-25 LAB — CBC WITH DIFFERENTIAL/PLATELET
Basophils Absolute: 0.1 10*3/uL (ref 0.0–0.2)
Basos: 1 %
EOS (ABSOLUTE): 0.4 10*3/uL (ref 0.0–0.4)
Eos: 5 %
Hematocrit: 39.6 % (ref 34.0–46.6)
Hemoglobin: 13 g/dL (ref 11.1–15.9)
Immature Grans (Abs): 0 10*3/uL (ref 0.0–0.1)
Immature Granulocytes: 0 %
Lymphocytes Absolute: 3.2 10*3/uL — ABNORMAL HIGH (ref 0.7–3.1)
Lymphs: 39 %
MCH: 29.4 pg (ref 26.6–33.0)
MCHC: 32.8 g/dL (ref 31.5–35.7)
MCV: 90 fL (ref 79–97)
Monocytes Absolute: 0.5 10*3/uL (ref 0.1–0.9)
Monocytes: 6 %
Neutrophils Absolute: 4 10*3/uL (ref 1.4–7.0)
Neutrophils: 49 %
Platelets: 243 10*3/uL (ref 150–450)
RBC: 4.42 x10E6/uL (ref 3.77–5.28)
RDW: 13.9 % (ref 11.7–15.4)
WBC: 8.2 10*3/uL (ref 3.4–10.8)

## 2021-06-25 LAB — HEMOGLOBIN A1C
Est. average glucose Bld gHb Est-mCnc: 223 mg/dL
Hgb A1c MFr Bld: 9.4 % — ABNORMAL HIGH (ref 4.8–5.6)

## 2021-06-25 LAB — MICROALBUMIN / CREATININE URINE RATIO
Creatinine, Urine: 186.9 mg/dL
Microalb/Creat Ratio: 62 mg/g creat — ABNORMAL HIGH (ref 0–29)
Microalbumin, Urine: 115.1 ug/mL

## 2021-06-25 MED ORDER — GLIMEPIRIDE 2 MG PO TABS: 2.0000 mg | ORAL_TABLET | Freq: Two times a day (BID) | ORAL | 3 refills | Status: DC

## 2021-06-25 NOTE — Telephone Encounter (Signed)
Patient aware of lab results.

## 2021-06-25 NOTE — Telephone Encounter (Signed)
Please call the pt back at [phone]

## 2021-07-13 ENCOUNTER — Other Ambulatory Visit: Payer: Self-pay

## 2021-07-13 MED ORDER — APIXABAN 5 MG PO TABS: 5.0000 mg | ORAL_TABLET | Freq: Two times a day (BID) | ORAL | 2 refills | Status: DC

## 2021-07-13 NOTE — Telephone Encounter (Signed)
Prescription refill request for Eliquis received. Indication: Afib  Last office visit:04/14/21 ([place])  Scr:1.28 (06/23/21) Age: 60 Weight: 119.8kg  Appropriate dose and refill sent to requested pharmacy.

## 2021-07-14 DIAGNOSIS — G4733 Obstructive sleep apnea (adult) (pediatric): Secondary | ICD-10-CM | POA: Insufficient documentation

## 2021-07-14 NOTE — Progress Notes (Signed)
Cardiology Office Note   Date:  07/15/2021   ID:  MARIACRISTINA Merritt, DOB 06-04-1961, MRN [id number]  PCP:  Noreene Larsson, NP  Cardiologist:   None   Chief Complaint  Patient presents with   Dizziness       History of Present Illness: Kathryn Merritt is a 60 y.o. female who presents for she was recently admitted 02/2021 with new onset atrial fibrillation starting around 0200 day of admission. They could not exclude atypical atrial flutter per notes. She was started on IV diltiazem but underwent DCCV 03/18/21 with successful conversion to NSR. She was started on Eliquis and Toprol. She was also noted to have mildly elevated troponin values felt c/w demand ischemia (also had hypertensive urgency during admission). 2D echo 03/18/21 EF 65-70%, moderate LVH, normal diastolic parameters, normal RV, mild LAE.   She has been started on Farxiga.  She wonders if this could be causing some headaches.  She is also complaining of dizziness.  This happens when she bends over and stands up.  She had a dizzy episode when she came in from outside to inside at her job 1 time.  She actually was referred because of this from an urgent care in September.  At the urgent care she was thought to have a new fascicular block on her EKG and was tachycardic.  However, when she went to the ER she was not found to have any acute cardiac abnormalities but was found to have a UTI.  I did review these records.  She is not having any further tachypalpitations consistent with her previous atrial fibrillation.  She has had no chest pressure, neck or arm discomfort  Past Medical History:  Diagnosis Date   Chronic back pain    Diabetes mellitus    Edema leg    Hyperlipidemia    Hypertension    Joint pain    Kidney stones    Other malaise and fatigue    PAF (paroxysmal atrial fibrillation) ([place])    a. new diagnosis in 02/2021 --> s/p DCCV on 03/18/2021   RBBB    Sinus bradycardia    Sleep apnea     Past Surgical  History:  Procedure Laterality Date   CARDIOVERSION N/A 03/18/2021   Procedure: CARDIOVERSION;  Surgeon: Satira Sark, MD;  Location: [facility];  Service: Cardiovascular;  Laterality: N/A;   CESAREAN SECTION     times 2   INCISION AND DRAINAGE ABSCESS Right    axilla   LITHOTRIPSY       Current Outpatient Medications  Medication Sig Dispense Refill   amLODipine (NORVASC) 10 MG tablet Take 1 tablet (10 mg total) by mouth daily. 90 tablet 3   apixaban (ELIQUIS) 5 MG TABS tablet Take 1 tablet (5 mg total) by mouth 2 (two) times daily. 60 tablet 2   dapagliflozin propanediol (FARXIGA) 10 MG TABS tablet Take 1 tablet (10 mg total) by mouth daily before breakfast. 30 tablet 2   glimepiride (AMARYL) 2 MG tablet Take 1 tablet (2 mg total) by mouth 2 (two) times daily. 60 tablet 3   JANUMET 50-1000 MG tablet Take 1 tablet by mouth 2 (two) times daily.     lisinopril-hydrochlorothiazide (ZESTORETIC) 20-25 MG tablet Take 1 tablet by mouth daily.     metoprolol succinate (TOPROL XL) 50 MG 24 hr tablet Take 1 tablet (50 mg total) by mouth daily. Take with or immediately following a meal. 90 tablet 3   rosuvastatin (CRESTOR) 10  MG tablet Take 1 tablet (10 mg total) by mouth daily. 90 tablet 3   No current facility-administered medications for this visit.    Allergies:   Patient has no known allergies.    ROS:  Please see the history of present illness.   Otherwise, review of systems are positive for none.   All other systems are reviewed and negative.    PHYSICAL EXAM: VS:  BP (!) 142/80   Pulse (!) 52   Ht 5\' 3"  (1.6 m)   Wt 260 lb (117.9 kg)   BMI 46.06 kg/m  , BMI Body mass index is 46.06 kg/m. GENERAL:  Well appearing HEENT:  Pupils equal round and reactive, fundi not visualized, oral mucosa unremarkable NECK:  No jugular venous distention, waveform within normal limits, carotid upstroke brisk and symmetric, no bruits, no thyromegaly LYMPHATICS:  No cervical, inguinal  adenopathy LUNGS:  Clear to auscultation bilaterally BACK:  No CVA tenderness CHEST:  Unremarkable HEART:  PMI not displaced or sustained,S1 and S2 within normal limits, no S3, no S4, no clicks, no rubs, no murmurs ABD:  Flat, positive bowel sounds normal in frequency in pitch, no bruits, no rebound, no guarding, no midline pulsatile mass, no hepatomegaly, no splenomegaly EXT:  2 plus pulses throughout, no edema, no cyanosis no clubbing SKIN:  No rashes no nodules NEURO:  Cranial nerves II through XII grossly intact, motor grossly intact throughout PSYCH:  Cognitively intact, oriented to person place and time    EKG:  EKG is not ordered today. The ekg ordered 06/11/2021 demonstrates sinus rhythm, rate 84, right bundle branch block, no acute ST-T wave changes.   Recent Labs: 03/18/2021: TSH 6.135 06/23/2021: ALT 23; BUN 28; Creatinine, Ser 1.28; Hemoglobin 13.0; Platelets 243; Potassium 4.5; Sodium 139    Lipid Panel    Component Value Date/Time   CHOL 140 06/23/2021 0824   TRIG 136 06/23/2021 0824   HDL 42 06/23/2021 0824   CHOLHDL 3.0 05/06/2021 0833   VLDL 18 05/06/2021 0833   LDLCALC 74 06/23/2021 0824      Wt Readings from Last 3 Encounters:  07/15/21 260 lb (117.9 kg)  06/18/21 264 lb 1.3 oz (119.8 kg)  06/11/21 255 lb (115.7 kg)      Other studies Reviewed: Additional studies/ records that were reviewed today include: ED records. Review of the above records demonstrates:  Please see elsewhere in the note.     ASSESSMENT AND PLAN:  Paroxysmal atrial fibrillation:    She is not having any symptomatic paroxysms of this. Continue Eliquis for CHADSVASC 3 (HTN, DM, female).    Essential HTN: Her blood pressure is upper limits of normal and we talked about weight loss and increase physical activity to bring this to target.  Hyperlipidemia associated with T2DM: LDL was 74 with an HDL of 42.  OSA: Her Epworth score was 15.  We will arrange a home sleep study.  DM:  The [place] was just added with an A1c of 9.4 and this was followed by her primary provider.  Morbid obesity: I gave her specific instructions about physical activity and diet.  Dizziness: This may be related to volume loss with unfortunately did not talk about precautions to avoid this.   Current medicines are reviewed at length with the patient today.  The patient does not have concerns regarding medicines.  The following changes have been made:  no change  Labs/ tests ordered today include: None  Orders Placed This Encounter  Procedures  Itamar Sleep Study      Disposition:   FU with me in six months.      Signed, Minus Breeding, MD  07/15/2021 10:54 AM    [facility]

## 2021-07-15 ENCOUNTER — Ambulatory Visit (INDEPENDENT_AMBULATORY_CARE_PROVIDER_SITE_OTHER): Payer: 59 | Admitting: Cardiology

## 2021-07-15 ENCOUNTER — Encounter: Payer: Self-pay | Admitting: Cardiology

## 2021-07-15 ENCOUNTER — Other Ambulatory Visit: Payer: Self-pay

## 2021-07-15 VITALS — BP 142/80 | HR 52 | Ht 63.0 in | Wt 260.0 lb

## 2021-07-15 DIAGNOSIS — I10 Essential (primary) hypertension: Secondary | ICD-10-CM | POA: Diagnosis not present

## 2021-07-15 DIAGNOSIS — E785 Hyperlipidemia, unspecified: Secondary | ICD-10-CM

## 2021-07-15 DIAGNOSIS — I480 Paroxysmal atrial fibrillation: Secondary | ICD-10-CM | POA: Diagnosis not present

## 2021-07-15 DIAGNOSIS — G4733 Obstructive sleep apnea (adult) (pediatric): Secondary | ICD-10-CM | POA: Diagnosis not present

## 2021-07-15 NOTE — Patient Instructions (Addendum)
Medication Instructions:  The current medical regimen is effective;  continue present plan and medications.  *If you need a refill on your cardiac medications before your next appointment, please call your pharmacy*  Testing/Procedures: Your physician has recommended that you have a sleep study. This test records several body functions during sleep, including: brain activity, eye movement, oxygen and carbon dioxide blood levels, heart rate and rhythm, breathing rate and rhythm, the flow of air through your mouth and nose, snoring, body muscle movements, and chest and belly movement. This test will be mailed to your home address.  Follow-Up: At [facility], you and your health needs are our priority.  As part of our continuing mission to provide you with exceptional heart care, we have created designated Provider Care Teams.  These Care Teams include your primary Cardiologist (physician) and Advanced Practice Providers (APPs -  Physician Assistants and Nurse Practitioners) who all work together to provide you with the care you need, when you need it.  We recommend signing up for the patient portal called "MyChart".  Sign up information is provided on this After Visit Summary.  MyChart is used to connect with patients for Virtual Visits (Telemedicine).  Patients are able to view lab/test results, encounter notes, upcoming appointments, etc.  Non-urgent messages can be sent to your provider as well.   To learn more about what you can do with MyChart, go to [email or web address].    Your next appointment:   6 month(s)  The format for your next appointment:   In Person  Provider:   Minus Breeding, MD   Thank you for choosing [facility]!!

## 2021-07-16 ENCOUNTER — Telehealth: Payer: Self-pay | Admitting: Nurse Practitioner

## 2021-07-16 ENCOUNTER — Encounter: Payer: Self-pay | Admitting: Internal Medicine

## 2021-07-16 ENCOUNTER — Ambulatory Visit (INDEPENDENT_AMBULATORY_CARE_PROVIDER_SITE_OTHER): Payer: 59 | Admitting: Internal Medicine

## 2021-07-16 DIAGNOSIS — K219 Gastro-esophageal reflux disease without esophagitis: Secondary | ICD-10-CM | POA: Insufficient documentation

## 2021-07-16 MED ORDER — OMEPRAZOLE 40 MG PO CPDR: 40.0000 mg | DELAYED_RELEASE_CAPSULE | Freq: Every day | ORAL | 3 refills | Status: DC

## 2021-07-16 NOTE — Progress Notes (Signed)
Virtual Visit via Telephone Note   This visit type was conducted due to national recommendations for restrictions regarding the COVID-19 Pandemic (e.g. social distancing) in an effort to limit this patient's exposure and mitigate transmission in our community.  Due to her co-morbid illnesses, this patient is at least at moderate risk for complications without adequate follow up.  This format is felt to be most appropriate for this patient at this time.  The patient did not have access to video technology/had technical difficulties with video requiring transitioning to audio format only (telephone).  All issues noted in this document were discussed and addressed.  No physical exam could be performed with this format.  Evaluation Performed:  Follow-up visit  Date:  07/16/2021   ID:  Kathryn Merritt, DOB 1960/12/10, MRN [id number]  Patient Location: Home Provider Location: Office/Clinic  Participants: Patient Location of Patient: Home Location of Provider: Telehealth Consent was obtain for visit to be over via telehealth. I verified that I am speaking with the correct person using two identifiers.  PCP:  Noreene Larsson, NP   Chief Complaint: Epigastric pain  History of Present Illness:    Kathryn Merritt is a 60 y.o. female who has a televisit for complaint of heartburn/acid reflux and odynophagia at times.  She denies any dysphagia.  She has tried taking Zantac with minimal relief.  She denies any nausea, vomiting, melena or hematochezia.  Of note she takes Eliquis for her A. fib.  The patient does not have symptoms concerning for COVID-19 infection (fever, chills, cough, or new shortness of breath).   Past Medical, Surgical, Social History, Allergies, and Medications have been Reviewed.  Past Medical History:  Diagnosis Date   Chronic back pain    Diabetes mellitus    Edema leg    Hyperlipidemia    Hypertension    Joint pain    Kidney stones    Other malaise and fatigue     PAF (paroxysmal atrial fibrillation) ([place])    a. new diagnosis in 02/2021 --> s/p DCCV on 03/18/2021   RBBB    Sinus bradycardia    Sleep apnea    Past Surgical History:  Procedure Laterality Date   CARDIOVERSION N/A 03/18/2021   Procedure: CARDIOVERSION;  Surgeon: Satira Sark, MD;  Location: [facility];  Service: Cardiovascular;  Laterality: N/A;   CESAREAN SECTION     times 2   INCISION AND DRAINAGE ABSCESS Right    axilla   LITHOTRIPSY       Current Meds  Medication Sig   apixaban (ELIQUIS) 5 MG TABS tablet Take 1 tablet (5 mg total) by mouth 2 (two) times daily.   dapagliflozin propanediol (FARXIGA) 10 MG TABS tablet Take 1 tablet (10 mg total) by mouth daily before breakfast.   glimepiride (AMARYL) 2 MG tablet Take 1 tablet (2 mg total) by mouth 2 (two) times daily.   JANUMET 50-1000 MG tablet Take 1 tablet by mouth 2 (two) times daily.   lisinopril-hydrochlorothiazide (ZESTORETIC) 20-25 MG tablet Take 1 tablet by mouth daily.   metoprolol succinate (TOPROL XL) 50 MG 24 hr tablet Take 1 tablet (50 mg total) by mouth daily. Take with or immediately following a meal.   omeprazole (PRILOSEC) 40 MG capsule Take 1 capsule (40 mg total) by mouth daily.   rosuvastatin (CRESTOR) 10 MG tablet Take 1 tablet (10 mg total) by mouth daily.     Allergies:   Patient has no known allergies.  ROS:   Please see the history of present illness.     All other systems reviewed and are negative.   Labs/Other Tests and Data Reviewed:    Recent Labs: 03/18/2021: TSH 6.135 06/23/2021: ALT 23; BUN 28; Creatinine, Ser 1.28; Hemoglobin 13.0; Platelets 243; Potassium 4.5; Sodium 139   Recent Lipid Panel Lab Results  Component Value Date/Time   CHOL 140 06/23/2021 08:24 AM   TRIG 136 06/23/2021 08:24 AM   HDL 42 06/23/2021 08:24 AM   CHOLHDL 3.0 05/06/2021 08:33 AM   LDLCALC 74 06/23/2021 08:24 AM    Wt Readings from Last 3 Encounters:  07/15/21 260 lb (117.9 kg)  06/18/21 264 lb 1.3 oz  (119.8 kg)  06/11/21 255 lb (115.7 kg)     ASSESSMENT & PLAN:    Gastroesophageal reflux disease Started Omeprazole Avoid hot and spicy food If persistent symptoms, will need GI referral for EGD considering she is on Eliquis  Time:   Today, I have spent 9 minutes reviewing the chart, including problem list, medications, and with the patient with telehealth technology discussing the above problems.   Medication Adjustments/Labs and Tests Ordered: Current medicines are reviewed at length with the patient today.  Concerns regarding medicines are outlined above.   Tests Ordered: No orders of the defined types were placed in this encounter.   Medication Changes: Meds ordered this encounter  Medications   omeprazole (PRILOSEC) 40 MG capsule    Sig: Take 1 capsule (40 mg total) by mouth daily.    Dispense:  30 capsule    Refill:  3     Note: This dictation was prepared with Dragon dictation along with smaller phrase technology. Similar sounding words can be transcribed inadequately or may not be corrected upon review. Any transcriptional errors that result from this process are unintentional.      Disposition:  Follow up  Signed, Lindell Spar, MD  07/16/2021 11:45 AM     [facility]

## 2021-07-16 NOTE — Telephone Encounter (Signed)
error 

## 2021-07-16 NOTE — Assessment & Plan Note (Signed)
Started Omeprazole Avoid hot and spicy food If persistent symptoms, will need GI referral for EGD considering she is on Eliquis

## 2021-07-16 NOTE — Patient Instructions (Signed)
Please start taking Omeprazole for acid reflux.

## 2021-07-20 ENCOUNTER — Other Ambulatory Visit: Payer: Self-pay | Admitting: Nurse Practitioner

## 2021-07-21 ENCOUNTER — Telehealth: Payer: Self-pay | Admitting: *Deleted

## 2021-07-21 NOTE — Telephone Encounter (Signed)
Prior Authorization for [employer] sent to [employer] via Fax  @ [phone].

## 2021-07-21 NOTE — Telephone Encounter (Signed)
Received a fax from patient's insurance. No PA required for the itamar. Ok to activate PIN.

## 2021-07-21 NOTE — Telephone Encounter (Signed)
-----   Message from Shellia Cleverly, RN sent at 07/15/2021 11:01 AM EDT ----- Regarding: Itamar sleep study This has been ordered for pt per Dr Percival Spanish.  Epworth score 15  Pt has smartphone.    Thank you  Pam

## 2021-07-21 NOTE — Telephone Encounter (Signed)
I tried to reach pt on her primary # though vm is full could not leave message. I then left message on the [phone] for the pt to call back and ask to s/w Arbie Cookey who dos the home sleep studies [phone]. Dr. Percival Spanish ordered Itamar study at 07/15/21 ov in [place] office. I will await for the pt to call back.

## 2021-07-22 NOTE — Telephone Encounter (Signed)
Patient was calling back. Said please call work phone instead of cell phone....[phone]

## 2021-07-22 NOTE — Telephone Encounter (Signed)
I was able to s/w the pt and we set up for her to come by the [place] location 11/4 @ 4 pm for Itamar sleep study. Pt has already been approved and I will provide her with the PIN# when she comes in 07/24/21. Pt thanked me for the call and the help.

## 2021-07-22 NOTE — Telephone Encounter (Signed)
Pt is calling to schedule her sleep study. Please call [phone]

## 2021-07-22 NOTE — Telephone Encounter (Signed)
Message routed to [facility]

## 2021-07-22 NOTE — Telephone Encounter (Signed)
Left message x 2 for pt to call back in regard to Dr. Percival Spanish wants pt to do a home sleep study (Itamar).

## 2021-07-24 NOTE — Telephone Encounter (Signed)
Pt came in to the office today to set up for Itamar study. All questions were answered for the pt while here today. Pt has been given PIN # [id number]. Pt aware once study is completed we will call her with the results and any recommendations. Pt thanked me for the help today.

## 2021-07-27 ENCOUNTER — Encounter (INDEPENDENT_AMBULATORY_CARE_PROVIDER_SITE_OTHER): Payer: 59 | Admitting: Cardiology

## 2021-07-27 DIAGNOSIS — G4733 Obstructive sleep apnea (adult) (pediatric): Secondary | ICD-10-CM | POA: Diagnosis not present

## 2021-08-03 ENCOUNTER — Telehealth: Payer: Self-pay | Admitting: *Deleted

## 2021-08-03 ENCOUNTER — Ambulatory Visit: Payer: 59

## 2021-08-03 DIAGNOSIS — G4733 Obstructive sleep apnea (adult) (pediatric): Secondary | ICD-10-CM

## 2021-08-03 NOTE — Procedures (Signed)
   Sleep Study Report  Patient Information Study Date: 07/27/21 Patient Name: Kathryn Merritt Patient ID: [id number] Birth Date: 2061-08-08 Age: 60 Gender: Female BMI: 46.1 (W=260 lb, H=5' 3'') Neck Circ.: 17 '' Epworth: 15 Referring Physician: Minus Breeding, MD   TEST DESCRIPTION: Home sleep apnea testing was completed using the WatchPat, a Type 1 device, utilizing peripheral arterial tonometry (PAT), chest movement, actigraphy, pulse oximetry, pulse rate, body position and snore. AHI was calculated with apnea and hypopnea using valid sleep time as the denominator. RDI includes apneas, hypopneas, and RERAs. The data acquired and the scoring of sleep and all associated events were performed in accordance with the recommended standards and specifications as outlined in the AASM Manual for the Scoring of Sleep and Associated Events 2.2.0 (2015).  FINDINGS: 1. Moderate Obstructive Sleep Apnea with AHI 17.2/hr. 2. No Central Sleep Apnea with pAHIc 0.7/hr. 3. Oxygen desaturations as low as 76%. 4. Moderate snoring was present. O2 sats were < 88% for 14.2 min. 5. Total sleep time was 5 hrs and 42 min. 6. 17.6% of total sleep time was spent in REM sleep. 7. Normal sleep onset latency at 30 min 8. Normal REM sleep onset latency at 83 min. 9. Total awakenings were 9.  DIAGNOSIS: Moderate Obstructive Sleep Apnea (G47.33)  RECOMMENDATIONS: 1. Clinical correlation of these findings is necessary. The decision to treat obstructive sleep apnea (OSA) is usually based on the presence of apnea symptoms or the presence of associated medical conditions such as Hypertension, Congestive Heart Failure, Atrial Fibrillation or Obesity. The most common symptoms of OSA are snoring, gasping for breath while sleeping, daytime sleepiness and fatigue.  2. Initiating apnea therapy is recommended given the presence of symptoms and/or associated conditions. Recommend proceeding with one of the following:    a. Auto-CPAP therapy with a pressure range of 5-20cm H2O.   b. An oral appliance (OA) that can be obtained from certain dentists with expertise in sleep medicine. These are primarily of use in non-obese patients with mild and moderate disease.   c. An ENT consultation which may be useful to look for specific causes of obstruction and possible treatment options.   d. If patient is intolerant to PAP therapy, consider referral to ENT for evaluation for hypoglossal nerve stimulator.  3. Close follow-up is necessary to ensure success with CPAP or oral appliance therapy for maximum benefit .  4. A follow-up oximetry study on CPAP is recommended to assess the adequacy of therapy and determine the need for supplemental oxygen or the potential need for Bi-level therapy. An arterial blood gas to determine the adequacy of baseline ventilation and oxygenation should also be considered.  5. Healthy sleep recommendations include: adequate nightly sleep (normal 7-9 hrs/night), avoidance of caffeine after noon and alcohol near bedtime, and maintaining a sleep environment that is cool, dark and quiet.  6. Weight loss for overweight patients is recommended. Even modest amounts of weight loss can significantly improve the severity of sleep apnea.  7. Snoring recommendations include: weight loss where appropriate, side sleeping, and avoidance of alcohol before bed.  8. Operation of motor vehicle should not be performed when sleepy.  Signature: Electronically Signed: 08/03/21 Fransico Him, MD; [facility]; [place], American Board of Sleep Medicine

## 2021-08-03 NOTE — Telephone Encounter (Signed)
-----   Message from Sueanne Margarita, MD sent at 08/03/2021  9:37 AM EST ----- Please let patient know that they have sleep apnea.  Recommend therapeutic CPAP titration for treatment of patient's sleep disordered breathing.  If unable to perform an in lab titration then initiate ResMed auto CPAP from 4 to 15cm H2O with heated humidity and mask of choice and overnight pulse ox on CPAP.

## 2021-08-07 NOTE — Telephone Encounter (Signed)
The patient has been notified of the result.  Left message on voicemail and informed patient to call back. Marolyn Hammock, [place] 08/07/2021 51:83 AM    Will precert titr

## 2021-08-18 NOTE — Telephone Encounter (Signed)
Prior Authorization for titration sent to [employer] via Fax [phone].

## 2021-08-19 NOTE — Telephone Encounter (Signed)
Prior Authorization for titration sent to [employer] via Fax.   Auth# [id number] Valid dates: 08/18/21- 11/17/21

## 2021-08-24 ENCOUNTER — Other Ambulatory Visit: Payer: Self-pay | Admitting: Nurse Practitioner

## 2021-08-24 DIAGNOSIS — E1165 Type 2 diabetes mellitus with hyperglycemia: Secondary | ICD-10-CM

## 2021-09-09 ENCOUNTER — Other Ambulatory Visit: Payer: Self-pay

## 2021-09-09 ENCOUNTER — Encounter: Payer: Self-pay | Admitting: Nurse Practitioner

## 2021-09-09 ENCOUNTER — Ambulatory Visit (INDEPENDENT_AMBULATORY_CARE_PROVIDER_SITE_OTHER): Payer: 59 | Admitting: Nurse Practitioner

## 2021-09-09 ENCOUNTER — Ambulatory Visit: Payer: 59

## 2021-09-09 DIAGNOSIS — J40 Bronchitis, not specified as acute or chronic: Secondary | ICD-10-CM

## 2021-09-09 LAB — POCT INFLUENZA A/B
Influenza A, POC: NEGATIVE
Influenza B, POC: NEGATIVE

## 2021-09-09 LAB — POCT RAPID STREP A (OFFICE): Rapid Strep A Screen: NEGATIVE

## 2021-09-09 MED ORDER — ALBUTEROL SULFATE HFA 108 (90 BASE) MCG/ACT IN AERS: 2.0000 | INHALATION_SPRAY | Freq: Four times a day (QID) | RESPIRATORY_TRACT | 0 refills | Status: DC | PRN

## 2021-09-09 MED ORDER — NOREL AD 4-10-325 MG PO TABS: 1.0000 | ORAL_TABLET | ORAL | 1 refills | Status: DC | PRN

## 2021-09-09 MED ORDER — PREDNISONE 10 MG PO TABS: ORAL_TABLET | ORAL | 0 refills | Status: AC

## 2021-09-09 NOTE — Progress Notes (Signed)
Flu neg

## 2021-09-09 NOTE — Progress Notes (Signed)
Acute Office Visit  Subjective:    Patient ID: Kathryn Merritt, female    DOB: [birth year], 60 y.o.   MRN: [id number]  Chief Complaint  Patient presents with   Cough    Cough,feels heavy when she breathes body aches x 2 days.    Cough Associated symptoms include chills, headaches, a sore throat and shortness of breath. Pertinent negatives include no fever or rhinorrhea.  Patient is in today for sick visit. Symptoms started over the weekend (09/05/21) and got worse in the last 2 days. She feel is like her airways are narrow and that her breathing is a little worse than usual. 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 She has not taken any OTC medicines.  Past Medical History:  Diagnosis Date   Chronic back pain    Diabetes mellitus    Edema leg    Hyperlipidemia    Hypertension    Joint pain    Kidney stones    Other malaise and fatigue    PAF (paroxysmal atrial fibrillation) ([place])    a. new diagnosis in 02/2021 --> s/p DCCV on 03/18/2021   RBBB    Sinus bradycardia    Sleep apnea     Past Surgical History:  Procedure Laterality Date   CARDIOVERSION N/A 03/18/2021   Procedure: CARDIOVERSION;  Surgeon: Satira Sark, MD;  Location: [facility];  Service: Cardiovascular;  Laterality: N/A;   CESAREAN SECTION     times 2   INCISION AND DRAINAGE ABSCESS Right    axilla   LITHOTRIPSY      Family History  Problem Relation Age of Onset   Cancer Father    Hypertension Father    Cancer Brother    Heart disease Paternal Grandmother     Social History   Socioeconomic History   Marital status: Married    Spouse name: Not on file   Number of children: 2   Years of education: Not on file   Highest education level: Not on file  Occupational History   Occupation: Occupational psychologist- Teacher  Tobacco Use   Smoking status: Never   Smokeless tobacco: Never  Vaping Use   Vaping Use: Never used  Substance and Sexual Activity   Alcohol use: No    Drug use: No   Sexual activity: Not Currently  Other Topics Concern   Not on file  Social History Narrative   Not on file   Social Determinants of Health   Financial Resource Strain: Not on file  Food Insecurity: Not on file  Transportation Needs: Not on file  Physical Activity: Not on file  Stress: Not on file  Social Connections: Not on file  Intimate Partner Violence: Not on file    Outpatient Medications Prior to Visit  Medication Sig Dispense Refill   apixaban (ELIQUIS) 5 MG TABS tablet Take 1 tablet (5 mg total) by mouth 2 (two) times daily. 60 tablet 2   FARXIGA 10 MG TABS tablet Take 1 tablet (10 mg total) by mouth daily before breakfast. 30 tablet 2   glimepiride (AMARYL) 2 MG tablet Take 1 tablet (2 mg total) by mouth 2 (two) times daily. 60 tablet 3   JANUMET 50-1000 MG tablet Take 1 tablet by mouth 2 (two) times daily.     lisinopril-hydrochlorothiazide (ZESTORETIC) 20-25 MG tablet Take 1 tablet by mouth daily 60 tablet 2   metoprolol succinate (TOPROL XL) 50 MG 24 hr tablet Take 1 tablet (50 mg  total) by mouth daily. Take with or immediately following a meal. 90 tablet 3   omeprazole (PRILOSEC) 40 MG capsule Take 1 capsule (40 mg total) by mouth daily. 30 capsule 3   rosuvastatin (CRESTOR) 10 MG tablet Take 1 tablet (10 mg total) by mouth daily. 90 tablet 3   amLODipine (NORVASC) 10 MG tablet Take 1 tablet (10 mg total) by mouth daily. 90 tablet 3   No facility-administered medications prior to visit.    No Known Allergies  Review of Systems  Constitutional:  Positive for chills and fatigue. Negative for fever.  HENT:  Positive for congestion, sinus pressure and sore throat. Negative for rhinorrhea and sinus pain.   Respiratory:  Positive for cough and shortness of breath.   Neurological:  Positive for headaches.      Objective:    Physical Exam  There were no vitals taken for this visit. Wt Readings from Last 3 Encounters:  07/15/21 260 lb (117.9 kg)   06/18/21 264 lb 1.3 oz (119.8 kg)  06/11/21 255 lb (115.7 kg)    Health Maintenance Due  Topic Date Due   COVID-19 Vaccine (1) Never done   Pneumococcal Vaccine 57-28 Years old (1 - PCV) Never done   FOOT EXAM  Never done   OPHTHALMOLOGY EXAM  Never done   Hepatitis C Screening  Never done   TETANUS/TDAP  Never done   PAP SMEAR-Modifier  Never done   COLONOSCOPY (Pts 45-74yr Insurance coverage will need to be confirmed)  Never done   MAMMOGRAM  Never done   Zoster Vaccines- Shingrix (1 of 2) Never done   INFLUENZA VACCINE  04/20/2021    There are no preventive care reminders to display for this patient.   Lab Results  Component Value Date   TSH 6.135 (H) 03/18/2021   Lab Results  Component Value Date   WBC 8.2 06/23/2021   HGB 13.0 06/23/2021   HCT 39.6 06/23/2021   MCV 90 06/23/2021   PLT 243 06/23/2021   Lab Results  Component Value Date   NA 139 06/23/2021   K 4.5 06/23/2021   CO2 24 06/23/2021   GLUCOSE 184 (H) 06/23/2021   BUN 28 (H) 06/23/2021   CREATININE 1.28 (H) 06/23/2021   BILITOT 0.4 06/23/2021   ALKPHOS 58 06/23/2021   AST 19 06/23/2021   ALT 23 06/23/2021   PROT 6.4 06/23/2021   ALBUMIN 4.1 06/23/2021   CALCIUM 10.1 06/23/2021   ANIONGAP 8 06/11/2021   EGFR 48 (L) 06/23/2021   Lab Results  Component Value Date   CHOL 140 06/23/2021   Lab Results  Component Value Date   HDL 42 06/23/2021   Lab Results  Component Value Date   LDLCALC 74 06/23/2021   Lab Results  Component Value Date   TRIG 136 06/23/2021   Lab Results  Component Value Date   CHOLHDL 3.0 05/06/2021   Lab Results  Component Value Date   HGBA1C 9.4 (H) 06/23/2021       Assessment & Plan:   Problem List Items Addressed This Visit       Respiratory   Bronchitis - Primary    -Rx. Albuterol, norel, and prednisone (short-course d/t DM) -will get strep, flu, and COVID swab -consider abx if all these test are negative      Relevant Medications    Chlorphen-PE-Acetaminophen (NOREL AD) 4-10-325 MG TABS   predniSONE (DELTASONE) 10 MG tablet   albuterol (VENTOLIN HFA) 108 (90 Base) MCG/ACT inhaler  Meds ordered this encounter  Medications   Chlorphen-PE-Acetaminophen (NOREL AD) 4-10-325 MG TABS    Sig: Take 1 tablet by mouth every 4 (four) hours as needed (nasal congestion, cold symptoms).    Dispense:  20 tablet    Refill:  1   predniSONE (DELTASONE) 10 MG tablet    Sig: Take 6 tablets (60 mg total) by mouth daily with breakfast for 1 day, THEN 5 tablets (50 mg total) daily with breakfast for 1 day, THEN 4 tablets (40 mg total) daily with breakfast for 1 day, THEN 3 tablets (30 mg total) daily with breakfast for 1 day, THEN 2 tablets (20 mg total) daily with breakfast for 1 day, THEN 1 tablet (10 mg total) daily with breakfast for 1 day.    Dispense:  21 tablet    Refill:  0   albuterol (VENTOLIN HFA) 108 (90 Base) MCG/ACT inhaler    Sig: Inhale 2 puffs into the lungs every 6 (six) hours as needed for wheezing or shortness of breath.    Dispense:  8 g    Refill:  0   Date:  09/09/2021   Location of Patient: Home Location of Provider: Office Consent was obtain for visit to be over via telehealth. I verified that I am speaking with the correct person using two identifiers.  I connected with  [name] on 09/09/21 via telephone and verified that I am speaking with the correct person using two identifiers.   I discussed the limitations of evaluation and management by telemedicine. The patient expressed understanding and agreed to proceed.  Time spent: 8 min    Noreene Larsson, NP

## 2021-09-09 NOTE — Assessment & Plan Note (Addendum)
-  Rx. Albuterol, norel, and prednisone (short-course d/t DM) -will get strep, flu, and COVID swab -consider abx if all these test are negative

## 2021-09-10 ENCOUNTER — Telehealth: Payer: Self-pay | Admitting: Cardiology

## 2021-09-10 NOTE — Telephone Encounter (Signed)
Patient called stating her HR is 87 and irregular.  Her normal is in the high 40's.  She is feeling very weak, fatigued, and very nausous. She  tested negative for the Flu is waiting of results for Covid.

## 2021-09-11 LAB — SARS-COV-2, NAA 2 DAY TAT

## 2021-09-11 LAB — NOVEL CORONAVIRUS, NAA: SARS-CoV-2, NAA: NOT DETECTED

## 2021-09-11 NOTE — Telephone Encounter (Signed)
Spoke with pt, she reports she may have had some atrial fib yesterday. She felt weak, and her heart rate was elevated. Today she is feeling much better and is planning on resting and see how it goes. Follow up appointment scheduled next available in [place] at patient request. Patient voiced understanding to call the office if her symptoms change or worsen.

## 2021-09-11 NOTE — Progress Notes (Signed)
Negative for COVID

## 2021-09-17 NOTE — Telephone Encounter (Signed)
Patient is scheduled for CPAP Titration on 10-02-20. Patient understands her titration study will be done at [facility] sleep lab. Patient understands she will receive a letter in a week or so detailing appointment, date, time, and location.

## 2021-09-23 ENCOUNTER — Ambulatory Visit (INDEPENDENT_AMBULATORY_CARE_PROVIDER_SITE_OTHER): Payer: 59 | Admitting: Nurse Practitioner

## 2021-09-23 ENCOUNTER — Encounter: Payer: Self-pay | Admitting: Nurse Practitioner

## 2021-09-23 ENCOUNTER — Other Ambulatory Visit (HOSPITAL_COMMUNITY)
Admission: RE | Admit: 2021-09-23 | Discharge: 2021-09-23 | Disposition: A | Discharge status: Home / Self Care | Payer: 59 | Source: Ambulatory Visit | Attending: Nurse Practitioner | Admitting: Nurse Practitioner

## 2021-09-23 ENCOUNTER — Encounter: Payer: 59 | Admitting: Nurse Practitioner

## 2021-09-23 ENCOUNTER — Other Ambulatory Visit: Payer: Self-pay

## 2021-09-23 VITALS — BP 118/76 | HR 55 | Ht 63.0 in | Wt 263.0 lb

## 2021-09-23 DIAGNOSIS — Z1211 Encounter for screening for malignant neoplasm of colon: Secondary | ICD-10-CM | POA: Diagnosis not present

## 2021-09-23 DIAGNOSIS — Z0000 Encounter for general adult medical examination without abnormal findings: Secondary | ICD-10-CM | POA: Diagnosis not present

## 2021-09-23 DIAGNOSIS — E1165 Type 2 diabetes mellitus with hyperglycemia: Secondary | ICD-10-CM | POA: Diagnosis not present

## 2021-09-23 DIAGNOSIS — Z124 Encounter for screening for malignant neoplasm of cervix: Secondary | ICD-10-CM | POA: Insufficient documentation

## 2021-09-23 DIAGNOSIS — E785 Hyperlipidemia, unspecified: Secondary | ICD-10-CM

## 2021-09-23 DIAGNOSIS — I10 Essential (primary) hypertension: Secondary | ICD-10-CM

## 2021-09-23 DIAGNOSIS — Z1212 Encounter for screening for malignant neoplasm of rectum: Secondary | ICD-10-CM

## 2021-09-23 DIAGNOSIS — Z1231 Encounter for screening mammogram for malignant neoplasm of breast: Secondary | ICD-10-CM | POA: Diagnosis not present

## 2021-09-23 NOTE — Progress Notes (Signed)
Established Patient Office Visit  Subjective:  Patient ID: Kathryn Merritt, female    DOB: [birth year]  Age: 61 y.o. MRN: [id number]  CC:  Chief Complaint  Patient presents with   Annual Exam    Annual Exam - No concerns    HPI Kathryn Merritt presents for annual exam She is due for shingles , covid booster, tdap vaccine. Due for PAP, mammogram and colonoscopy.  Past Medical History:  Diagnosis Date   Chronic back pain    Diabetes mellitus    Edema leg    Hyperlipidemia    Hypertension    Joint pain    Kidney stones    Other malaise and fatigue    PAF (paroxysmal atrial fibrillation) ([place])    a. new diagnosis in 02/2021 --> s/p DCCV on 03/18/2021   RBBB    Sinus bradycardia    Sleep apnea     Past Surgical History:  Procedure Laterality Date   CARDIOVERSION N/A 03/18/2021   Procedure: CARDIOVERSION;  Surgeon: Satira Sark, MD;  Location: [facility];  Service: Cardiovascular;  Laterality: N/A;   CESAREAN SECTION     times 2   INCISION AND DRAINAGE ABSCESS Right    axilla   LITHOTRIPSY      Family History  Problem Relation Age of Onset   Cancer Father    Hypertension Father    Cancer Brother    Heart disease Paternal Grandmother     Social History   Socioeconomic History   Marital status: Married    Spouse name: Not on file   Number of children: 2   Years of education: Not on file   Highest education level: Not on file  Occupational History   Occupation: Occupational psychologist- Teacher  Tobacco Use   Smoking status: Never   Smokeless tobacco: Never  Vaping Use   Vaping Use: Never used  Substance and Sexual Activity   Alcohol use: No   Drug use: No   Sexual activity: Not Currently  Other Topics Concern   Not on file  Social History Narrative   Not on file   Social Determinants of Health   Financial Resource Strain: Not on file  Food Insecurity: Not on file  Transportation Needs: Not on file  Physical Activity: Not on file  Stress: Not  on file  Social Connections: Not on file  Intimate Partner Violence: Not on file    Outpatient Medications Prior to Visit  Medication Sig Dispense Refill   albuterol (VENTOLIN HFA) 108 (90 Base) MCG/ACT inhaler Inhale 2 puffs into the lungs every 6 (six) hours as needed for wheezing or shortness of breath. 8 g 0   apixaban (ELIQUIS) 5 MG TABS tablet Take 1 tablet (5 mg total) by mouth 2 (two) times daily. 60 tablet 2   Chlorphen-PE-Acetaminophen (NOREL AD) 4-10-325 MG TABS Take 1 tablet by mouth every 4 (four) hours as needed (nasal congestion, cold symptoms). 20 tablet 1   FARXIGA 10 MG TABS tablet Take 1 tablet (10 mg total) by mouth daily before breakfast. 30 tablet 2   glimepiride (AMARYL) 2 MG tablet Take 1 tablet (2 mg total) by mouth 2 (two) times daily. 60 tablet 3   JANUMET 50-1000 MG tablet Take 1 tablet by mouth 2 (two) times daily.     lisinopril-hydrochlorothiazide (ZESTORETIC) 20-25 MG tablet Take 1 tablet by mouth daily 60 tablet 2   metoprolol succinate (TOPROL XL) 50 MG 24 hr tablet Take 1 tablet (50  mg total) by mouth daily. Take with or immediately following a meal. 90 tablet 3   omeprazole (PRILOSEC) 40 MG capsule Take 1 capsule (40 mg total) by mouth daily. 30 capsule 3   rosuvastatin (CRESTOR) 10 MG tablet Take 1 tablet (10 mg total) by mouth daily. 90 tablet 3   amLODipine (NORVASC) 10 MG tablet Take 1 tablet (10 mg total) by mouth daily. 90 tablet 3   No facility-administered medications prior to visit.    No Known Allergies  ROS Review of Systems  Constitutional: Negative.   HENT: Negative.    Eyes: Negative.   Respiratory: Negative.    Cardiovascular: Negative.   Gastrointestinal: Negative.   Endocrine: Negative.   Genitourinary: Negative.   Musculoskeletal: Negative.   Skin: Negative.   Allergic/Immunologic: Negative.   Neurological: Negative.   Hematological: Negative.   Psychiatric/Behavioral: Negative.       Objective:    Physical Exam Vitals  reviewed. Exam conducted with a chaperone present.  Constitutional:      General: She is not in acute distress.    Appearance: She is obese. She is not ill-appearing, toxic-appearing or diaphoretic.  HENT:     Head: Normocephalic and atraumatic.     Right Ear: Tympanic membrane, ear canal and external ear normal. There is no impacted cerumen.     Left Ear: Tympanic membrane, ear canal and external ear normal. There is no impacted cerumen.     Nose: Nose normal. No congestion or rhinorrhea.     Mouth/Throat:     Mouth: Mucous membranes are moist.     Pharynx: Oropharynx is clear. No oropharyngeal exudate or posterior oropharyngeal erythema.  Eyes:     General: No scleral icterus.       Right eye: No discharge.        Left eye: No discharge.     Extraocular Movements: Extraocular movements intact.     Conjunctiva/sclera: Conjunctivae normal.  Neck:     Vascular: No carotid bruit.  Cardiovascular:     Rate and Rhythm: Normal rate and regular rhythm.     Pulses: Normal pulses.     Heart sounds: Normal heart sounds. No murmur heard.   No friction rub.  Pulmonary:     Effort: No respiratory distress.     Breath sounds: No stridor. No wheezing, rhonchi or rales.  Chest:     Chest wall: No mass, lacerations, deformity, swelling, tenderness or edema.  Breasts:    Tanner Score is 5.     Right: Normal. No swelling, bleeding, inverted nipple, mass, nipple discharge, skin change or tenderness.     Left: Normal. No swelling, bleeding, inverted nipple, mass, nipple discharge, skin change or tenderness.  Abdominal:     General: There is no distension.     Palpations: There is no mass.     Tenderness: There is no abdominal tenderness. There is no right CVA tenderness, left CVA tenderness, guarding or rebound.     Hernia: No hernia is present. There is no hernia in the left inguinal area or right inguinal area.  Genitourinary:    General: Normal vulva.     Exam position: Knee-chest position.      Tanner stage (genital): 5.     Labia:        Right: No rash, tenderness, lesion or injury.        Left: No rash, tenderness, lesion or injury.      Urethra: No prolapse, urethral pain, urethral swelling or urethral  lesion.     Vagina: No signs of injury and foreign body. No vaginal discharge, erythema, tenderness, bleeding, lesions or prolapsed vaginal walls.     Cervix: Dilated. No cervical motion tenderness, discharge, friability, lesion, erythema, cervical bleeding or eversion.     Uterus: Not tender and no uterine prolapse.      Adnexa:        Right: No mass, tenderness or fullness.         Left: No mass, tenderness or fullness.    Musculoskeletal:        General: No swelling, tenderness, deformity or signs of injury.     Cervical back: No rigidity or tenderness.     Right lower leg: No edema.     Left lower leg: No edema.  Lymphadenopathy:     Cervical: No cervical adenopathy.     Upper Body:     Right upper body: No supraclavicular, axillary or pectoral adenopathy.     Left upper body: No supraclavicular, axillary or pectoral adenopathy.     Lower Body: No right inguinal adenopathy. No left inguinal adenopathy.  Skin:    General: Skin is warm and dry.     Capillary Refill: Capillary refill takes less than 2 seconds.     Coloration: Skin is not jaundiced or pale.     Findings: No bruising, erythema, lesion or rash.  Neurological:     Mental Status: She is alert and oriented to person, place, and time.     Cranial Nerves: No cranial nerve deficit.     Sensory: No sensory deficit.     Motor: No weakness.     Coordination: Coordination normal.     Gait: Gait normal.     Deep Tendon Reflexes: Reflexes normal.  Psychiatric:        Mood and Affect: Mood normal.        Behavior: Behavior normal.    BP 118/76 (BP Location: Right Arm, Patient Position: Sitting, Cuff Size: Large)    Pulse (!) 55    Ht _0  (1.6 m)    Wt 263 lb (119.3 kg)    SpO2 99%    BMI 46.59 kg/m  Wt  Readings from Last 3 Encounters:  09/23/21 263 lb (119.3 kg)  07/15/21 260 lb (117.9 kg)  06/18/21 264 lb 1.3 oz (119.8 kg)     Health Maintenance Due  Topic Date Due   Pneumococcal Vaccine 75-52 Years old (1 - PCV) Never done   FOOT EXAM  Never done   Hepatitis C Screening  Never done   TETANUS/TDAP  Never done   PAP SMEAR-Modifier  Never done   COLONOSCOPY (Pts 45-33yr Insurance coverage will need to be confirmed)  Never done   MAMMOGRAM  Never done   Zoster Vaccines- Shingrix (1 of 2) Never done   COVID-19 Vaccine (3 - Booster for Moderna series) 11/25/2020    There are no preventive care reminders to display for this patient.  Lab Results  Component Value Date   TSH 6.135 (H) 03/18/2021   Lab Results  Component Value Date   WBC 8.2 06/23/2021   HGB 13.0 06/23/2021   HCT 39.6 06/23/2021   MCV 90 06/23/2021   PLT 243 06/23/2021   Lab Results  Component Value Date   NA 139 06/23/2021   K 4.5 06/23/2021   CO2 24 06/23/2021   GLUCOSE 184 (H) 06/23/2021   BUN 28 (H) 06/23/2021   CREATININE 1.28 (H) 06/23/2021   BILITOT 0.4  06/23/2021   ALKPHOS 58 06/23/2021   AST 19 06/23/2021   ALT 23 06/23/2021   PROT 6.4 06/23/2021   ALBUMIN 4.1 06/23/2021   CALCIUM 10.1 06/23/2021   ANIONGAP 8 06/11/2021   EGFR 48 (L) 06/23/2021   Lab Results  Component Value Date   CHOL 140 06/23/2021   Lab Results  Component Value Date   HDL 42 06/23/2021   Lab Results  Component Value Date   LDLCALC 74 06/23/2021   Lab Results  Component Value Date   TRIG 136 06/23/2021   Lab Results  Component Value Date   CHOLHDL 3.0 05/06/2021   Lab Results  Component Value Date   HGBA1C 9.4 (H) 06/23/2021      Assessment & Plan:   Problem List Items Addressed This Visit   None   No orders of the defined types were placed in this encounter.   Follow-up: No follow-ups on file.    Renee Rival, FNP

## 2021-09-23 NOTE — Assessment & Plan Note (Signed)
Annual exam as documented.  ?Counseling done include healthy lifestyle involving committing to 150 minutes of exercise per week, heart healthy diet, and attaining healthy weight. The importance of adequate sleep also discussed.  ?Regular use of seat belt and home safety were also discussed . ?Changes in health habits are decided on by patient with goals and time frames set for achieving them. ?Immunization and cancer screening  needs are specifically addressed at this visit.   ?

## 2021-09-23 NOTE — Assessment & Plan Note (Signed)
Takes glimepiride 2mg  BID, Janumet50-1000 mg tablet, farxiga 10mg . Lab Results  Component Value Date   HGBA1C 9.4 (H) 06/23/2021  A1c today. Refer to endocrinology.  Avoid sugar, sweets, soda, loose weight. Foot exam normal .

## 2021-09-23 NOTE — Patient Instructions (Signed)
Please get your shingles , tdap vaccine and covid booster at your pharmacy.  Lab work today  It is important that you exercise regularly at least 30 minutes 5 times a week.  Think about what you will eat, plan ahead. Choose " clean, green, fresh or frozen" over canned, processed or packaged foods which are more sugary, salty and fatty. 70 to 75% of food eaten should be vegetables and fruit. Three meals at set times with snacks allowed between meals, but they must be fruit or vegetables. Aim to eat over a 12 hour period , example 7 am to 7 pm, and STOP after  your last meal of the day. Drink water,generally about 64 ounces per day, no other drink is as healthy. Fruit juice is best enjoyed in a healthy way, by EATING the fruit.  Thanks for choosing [facility], we consider it a privelige to serve you.

## 2021-09-23 NOTE — Assessment & Plan Note (Signed)
DASH diet and commitment to daily physical activity for a minimum of 30 minutes discussed and encouraged, as a part of hypertension management. The importance of attaining a healthy weight is also discussed.  BP/Weight 09/23/2021 07/15/2021 06/18/2021 06/11/2021 06/11/2021 04/14/2021 7/57/3225  Systolic BP 672 091 980 221 798 102 548  Diastolic BP 76 80 83 74 96 68 84  Wt. (Lbs) 263 260 264.08 255 - 260.8 256.84  BMI 46.59 46.06 46.78 45.17 - 46.2 45.5  well controlled  w

## 2021-09-23 NOTE — Assessment & Plan Note (Signed)
Importance of healthy food choices with portion control discussed as well as eating regularly within 12  hour window.  ° °The need to choose clean green food 50%-75% of time is discussed as well as make water the primary drink and set a goal for 64 ounces daily. ° °Patient reeducated about the importance of committment to minimum of 150 minutes of exercise per week. ° °Three meals at set times with snacks allowed between meals but they must be fruit or vegetable.  ° °Aim to eat  over 12 hour period  for example 7 am to 7 pm. Stop after your last meal of the day.  °

## 2021-09-23 NOTE — Assessment & Plan Note (Addendum)
Lipid panel today. Takes crestor 10mg  daily

## 2021-09-24 ENCOUNTER — Other Ambulatory Visit: Payer: Self-pay | Admitting: Nurse Practitioner

## 2021-09-24 DIAGNOSIS — E785 Hyperlipidemia, unspecified: Secondary | ICD-10-CM

## 2021-09-24 DIAGNOSIS — E559 Vitamin D deficiency, unspecified: Secondary | ICD-10-CM

## 2021-09-24 LAB — CMP14+EGFR
ALT: 20 IU/L (ref 0–32)
AST: 18 IU/L (ref 0–40)
Albumin/Globulin Ratio: 1.9 (ref 1.2–2.2)
Albumin: 4.4 g/dL (ref 3.8–4.9)
Alkaline Phosphatase: 55 IU/L (ref 44–121)
BUN/Creatinine Ratio: 20 (ref 9–23)
BUN: 29 mg/dL — ABNORMAL HIGH (ref 6–24)
Bilirubin Total: 0.4 mg/dL (ref 0.0–1.2)
CO2: 23 mmol/L (ref 20–29)
Calcium: 10.4 mg/dL — ABNORMAL HIGH (ref 8.7–10.2)
Chloride: 105 mmol/L (ref 96–106)
Creatinine, Ser: 1.42 mg/dL — ABNORMAL HIGH (ref 0.57–1.00)
Globulin, Total: 2.3 g/dL (ref 1.5–4.5)
Glucose: 95 mg/dL (ref 70–99)
Potassium: 4.5 mmol/L (ref 3.5–5.2)
Sodium: 144 mmol/L (ref 134–144)
Total Protein: 6.7 g/dL (ref 6.0–8.5)
eGFR: 43 mL/min/{1.73_m2} — ABNORMAL LOW (ref >59)

## 2021-09-24 LAB — LIPID PANEL
Chol/HDL Ratio: 3 ratio (ref 0.0–4.4)
Cholesterol, Total: 137 mg/dL (ref 100–199)
HDL: 45 mg/dL (ref >39)
LDL Chol Calc (NIH): 64 mg/dL (ref 0–99)
Triglycerides: 162 mg/dL — ABNORMAL HIGH (ref 0–149)
VLDL Cholesterol Cal: 28 mg/dL (ref 5–40)

## 2021-09-24 LAB — TSH+FREE T4
Free T4: 1 ng/dL (ref 0.82–1.77)
TSH: 4.55 u[IU]/mL — ABNORMAL HIGH (ref 0.450–4.500)

## 2021-09-24 LAB — VITAMIN D 25 HYDROXY (VIT D DEFICIENCY, FRACTURES): Vit D, 25-Hydroxy: 16.5 ng/mL — ABNORMAL LOW (ref 30.0–100.0)

## 2021-09-24 LAB — HEMOGLOBIN A1C
Est. average glucose Bld gHb Est-mCnc: 206 mg/dL
Hgb A1c MFr Bld: 8.8 % — ABNORMAL HIGH (ref 4.8–5.6)

## 2021-09-24 MED ORDER — ROSUVASTATIN CALCIUM 20 MG PO TABS: 20.0000 mg | ORAL_TABLET | Freq: Every day | ORAL | 3 refills | Status: DC

## 2021-09-24 MED ORDER — VITAMIN D3 25 MCG (1000 UT) PO CAPS: 1000.0000 [IU] | ORAL_CAPSULE | Freq: Every day | ORAL | 3 refills | Status: DC

## 2021-09-24 NOTE — Addendum Note (Signed)
Addended by: Renee Rival on: 09/24/2021 12:15 PM   Modules accepted: Orders

## 2021-09-24 NOTE — Progress Notes (Signed)
Please review labs with pt. Cholesterol level is nit at goal , start taking crestor 20mg  daily Eat a healthy diet, including lots of fruits and vegetables. Avoid foods with a lot of saturated and trans fats, such as red meat, butter, fried foods and cheese . Maintain a healthy weight.  Kidney function is reduced, she should avoid aleeve, ibuprofen drink plenty of water to stay hydrated  TSH level is high but T4 is normal will have her follow up on this   A1C is slightly improved, Calcium level is high, follow up with endo as we discussed yesterday.  Start taking Take vitamin D 1000 units daily for low vitamind d.  Thanks

## 2021-09-28 ENCOUNTER — Other Ambulatory Visit: Payer: Self-pay | Admitting: Cardiovascular Disease

## 2021-09-28 ENCOUNTER — Encounter: Payer: Self-pay | Admitting: Internal Medicine

## 2021-09-28 LAB — CYTOLOGY - PAP
Comment: NEGATIVE
Diagnosis: NEGATIVE
High risk HPV: NEGATIVE

## 2021-09-28 NOTE — Telephone Encounter (Signed)
Prescription refill request for Eliquis received. Indication:Afib Last office visit:10/22 Scr:1.4 Age: 61 Weight:119.3 kg  Prescription refilled

## 2021-10-01 ENCOUNTER — Encounter: Payer: Self-pay | Admitting: Nurse Practitioner

## 2021-10-01 ENCOUNTER — Other Ambulatory Visit: Payer: Self-pay

## 2021-10-01 ENCOUNTER — Ambulatory Visit (INDEPENDENT_AMBULATORY_CARE_PROVIDER_SITE_OTHER): Payer: 59 | Admitting: Nurse Practitioner

## 2021-10-01 VITALS — BP 121/73 | HR 51 | Ht 61.25 in | Wt 262.0 lb

## 2021-10-01 DIAGNOSIS — E782 Mixed hyperlipidemia: Secondary | ICD-10-CM

## 2021-10-01 DIAGNOSIS — I10 Essential (primary) hypertension: Secondary | ICD-10-CM

## 2021-10-01 DIAGNOSIS — E1165 Type 2 diabetes mellitus with hyperglycemia: Secondary | ICD-10-CM

## 2021-10-01 DIAGNOSIS — E559 Vitamin D deficiency, unspecified: Secondary | ICD-10-CM

## 2021-10-01 MED ORDER — BLOOD GLUCOSE METER KIT: PACK | 0 refills | Status: AC

## 2021-10-01 MED ORDER — TIRZEPATIDE 5 MG/0.5ML ~~LOC~~ SOAJ: 5.0000 mg | SUBCUTANEOUS | 3 refills | Status: DC

## 2021-10-01 NOTE — Patient Instructions (Signed)

## 2021-10-01 NOTE — Progress Notes (Signed)
Endocrinology Consult Note       10/01/2021, 4:04 PM   Subjective:    Patient ID: Kathryn Merritt, female    DOB: [birth year].  Kathryn Merritt is being seen in consultation for management of currently uncontrolled symptomatic diabetes requested by  Renee Rival, FNP.   Past Medical History:  Diagnosis Date   Chronic back pain    Diabetes mellitus    Edema leg    Hyperlipidemia    Hypertension    Joint pain    Kidney stones    Other malaise and fatigue    PAF (paroxysmal atrial fibrillation) ([place])    a. new diagnosis in 02/2021 --> s/p DCCV on 03/18/2021   RBBB    Sinus bradycardia    Sleep apnea     Past Surgical History:  Procedure Laterality Date   CARDIOVERSION N/A 03/18/2021   Procedure: CARDIOVERSION;  Surgeon: Satira Sark, MD;  Location: [facility];  Service: Cardiovascular;  Laterality: N/A;   CESAREAN SECTION     times 2   INCISION AND DRAINAGE ABSCESS Right    axilla   LITHOTRIPSY      Social History   Socioeconomic History   Marital status: Married    Spouse name: Not on file   Number of children: 2   Years of education: Not on file   Highest education level: Not on file  Occupational History   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  Tobacco Use   Smoking status: Never   Smokeless tobacco: Never  Vaping Use   Vaping Use: Never used  Substance and Sexual Activity   Alcohol use: No   Drug use: No   Sexual activity: Not Currently  Other Topics Concern   Not on file  Social History Narrative   Live with her husband, works at [employer] center,    Social Determinants of Health   Financial Resource Strain: Not on file  Food Insecurity: Not on file  Transportation Needs: Not on file  Physical Activity: Not on file  Stress: Not on file  Social Connections: Not on file    Family History  Problem Relation Age of Onset   Hypertension Mother     Cancer Father    Hypertension Father    Cancer Brother    Heart disease Paternal Grandmother    Breast cancer Neg Hx    Colon cancer Neg Hx     Outpatient Encounter Medications as of 10/01/2021  Medication Sig   albuterol (VENTOLIN HFA) 108 (90 Base) MCG/ACT inhaler Inhale 2 puffs into the lungs every 6 (six) hours as needed for wheezing or shortness of breath.   amLODipine (NORVASC) 10 MG tablet Take 1 tablet (10 mg total) by mouth daily.   apixaban (ELIQUIS) 5 MG TABS tablet TAKE ONE TABLET BY MOUTH TWICE DAILY   blood glucose meter kit and supplies Dispense based on patient and insurance preference. Use up to four times daily as directed. (FOR ICD-10 E10.9, E11.9).   Cholecalciferol (VITAMIN D3) 25 MCG (1000 UT) CAPS Take 1 capsule (1,000 Units total) by mouth daily.   glimepiride (AMARYL) 2 MG tablet Take 1 tablet (2 mg total) by  mouth 2 (two) times daily.   lisinopril-hydrochlorothiazide (ZESTORETIC) 20-25 MG tablet Take 1 tablet by mouth daily   metoprolol succinate (TOPROL XL) 50 MG 24 hr tablet Take 1 tablet (50 mg total) by mouth daily. Take with or immediately following a meal.   omeprazole (PRILOSEC) 40 MG capsule Take 1 capsule (40 mg total) by mouth daily.   rosuvastatin (CRESTOR) 20 MG tablet Take 1 tablet (20 mg total) by mouth daily.   tirzepatide [facility]) 5 MG/0.5ML Pen Inject 5 mg into the skin once a week.   [DISCONTINUED] FARXIGA 10 MG TABS tablet Take 1 tablet (10 mg total) by mouth daily before breakfast.   [DISCONTINUED] JANUMET 50-1000 MG tablet Take 1 tablet by mouth 2 (two) times daily.   [DISCONTINUED] Chlorphen-PE-Acetaminophen (NOREL AD) 4-10-325 MG TABS Take 1 tablet by mouth every 4 (four) hours as needed (nasal congestion, cold symptoms). (Patient not taking: Reported on 10/01/2021)   [DISCONTINUED] GNP VITAMIN D 25 MCG (1000 UT) tablet Take 1,000 Units by mouth daily. (Patient not taking: Reported on 10/01/2021)   No facility-administered encounter  medications on file as of 10/01/2021.    ALLERGIES: No Known Allergies  VACCINATION STATUS: Immunization History  Administered Date(s) Administered   Influenza,inj,Quad PF,6+ Mos 06/01/2021   Influenza,trivalent, recombinat, inj, PF 09/07/2014   Moderna SARS-COV2 Booster Vaccination 09/30/2020   Moderna Sars-Covid-2 Vaccination 11/20/2019, 12/18/2019    Diabetes She presents for her initial diabetic visit. She has type 2 diabetes mellitus. Onset time: diagnosed at approx age of 61. There are no hypoglycemic associated symptoms. Associated symptoms include fatigue, polydipsia and polyuria. There are no hypoglycemic complications. Symptoms are stable. Diabetic complications include heart disease (Afib) and nephropathy. Risk factors for coronary artery disease include diabetes mellitus, dyslipidemia, family history, obesity, hypertension, post-menopausal and sedentary lifestyle. Current diabetic treatment includes oral agent (triple therapy) (Farxiga, Glimepiride, Janumet). She is compliant with treatment most of the time. Her weight is fluctuating minimally. She is following a generally unhealthy diet. When asked about meal planning, she reported none. She has not had a previous visit with a dietitian. She rarely participates in exercise. (She presents today for her consultation with no meter or logs to review.  Her most recent A1c was 8.8% on 09/23/21.  She does not routinely monitor glucose as her meter is broken.  She admits to drinking mostly diet soda and artificially sweetened tea.  She eats 3 meals per day with snacks in between and does not engage in any routine physical activity.  She is having an eye exam next week and has been seen by podiatry in the past.  She denies any s/s of hypoglycemia.) An ACE inhibitor/angiotensin II receptor blocker is being taken. She sees a podiatrist.Eye exam is current (having next exam sometime next week).  Hypertension This is a chronic problem. The current  episode started more than 1 year ago. The problem has been resolved since onset. The problem is controlled. There are no associated agents to hypertension. Risk factors for coronary artery disease include diabetes mellitus, dyslipidemia, family history, obesity, post-menopausal state and sedentary lifestyle. Past treatments include diuretics, beta blockers, ACE inhibitors and calcium channel blockers. The current treatment provides moderate improvement. Compliance problems include diet and exercise.  Hypertensive end-organ damage includes kidney disease. Identifiable causes of hypertension include chronic renal disease.  Hyperlipidemia This is a chronic problem. The current episode started more than 1 year ago. The problem is uncontrolled. Recent lipid tests were reviewed and are variable. Exacerbating diseases include chronic  renal disease, diabetes and obesity. Factors aggravating her hyperlipidemia include beta blockers, fatty foods and thiazides. Current antihyperlipidemic treatment includes statins. The current treatment provides mild improvement of lipids. Compliance problems include adherence to diet and adherence to exercise.  Risk factors for coronary artery disease include diabetes mellitus, dyslipidemia, family history, obesity, hypertension, a sedentary lifestyle and post-menopausal.    Review of systems  Constitutional: + Minimally fluctuating body weight, current Body mass index is 49.1 kg/m., no fatigue, no subjective hyperthermia, no subjective hypothermia Eyes: no blurry vision, no xerophthalmia ENT: no sore throat, no nodules palpated in throat, no dysphagia/odynophagia, no hoarseness Cardiovascular: no chest pain, no shortness of breath, no palpitations, no leg swelling Respiratory: no cough, no shortness of breath Gastrointestinal: no nausea/vomiting/diarrhea Genitourinary: + polyuria Musculoskeletal: no muscle/joint aches Skin: no rashes, no hyperemia Neurological: no tremors,  no numbness, no tingling, no dizziness Psychiatric: no depression, no anxiety  Objective:     BP 121/73    Pulse (!) 51    Ht 5' 1.25" (1.556 m)    Wt 262 lb (118.8 kg)    SpO2 98%    BMI 49.10 kg/m   Wt Readings from Last 3 Encounters:  10/01/21 262 lb (118.8 kg)  09/23/21 263 lb (119.3 kg)  07/15/21 260 lb (117.9 kg)     BP Readings from Last 3 Encounters:  10/01/21 121/73  09/23/21 118/76  07/15/21 (!) 142/80     Physical Exam- Limited  Constitutional:  Body mass index is 49.1 kg/m. , not in acute distress, normal state of mind Eyes:  EOMI, no exophthalmos Neck: Supple Cardiovascular: RRR, no murmurs, rubs, or gallops, no edema Respiratory: Adequate breathing efforts, no crackles, rales, rhonchi, or wheezing Musculoskeletal: no gross deformities, strength intact in all four extremities, no gross restriction of joint movements Skin:  no rashes, no hyperemia Neurological: no tremor with outstretched hands    CMP ( most recent) CMP     Component Value Date/Time   NA 144 09/23/2021 1223   K 4.5 09/23/2021 1223   CL 105 09/23/2021 1223   CO2 23 09/23/2021 1223   GLUCOSE 95 09/23/2021 1223   GLUCOSE 192 (H) 06/11/2021 1024   BUN 29 (H) 09/23/2021 1223   CREATININE 1.42 (H) 09/23/2021 1223   CALCIUM 10.4 (H) 09/23/2021 1223   PROT 6.7 09/23/2021 1223   ALBUMIN 4.4 09/23/2021 1223   AST 18 09/23/2021 1223   ALT 20 09/23/2021 1223   ALKPHOS 55 09/23/2021 1223   BILITOT 0.4 09/23/2021 1223   GFRNONAA >60 06/11/2021 1024   GFRAA >60 02/22/2020 0924     Diabetic Labs (most recent): Lab Results  Component Value Date   HGBA1C 8.8 (H) 09/23/2021   HGBA1C 9.4 (H) 06/23/2021   HGBA1C 12.2 (H) 03/18/2021     Lipid Panel ( most recent) Lipid Panel     Component Value Date/Time   CHOL 137 09/23/2021 1223   TRIG 162 (H) 09/23/2021 1223   HDL 45 09/23/2021 1223   CHOLHDL 3.0 09/23/2021 1223   CHOLHDL 3.0 05/06/2021 0833   VLDL 18 05/06/2021 0833   LDLCALC 64  09/23/2021 1223   LABVLDL 28 09/23/2021 1223      Lab Results  Component Value Date   TSH 4.550 (H) 09/23/2021   TSH 6.135 (H) 03/18/2021   TSH 1.74 06/23/2012   FREET4 1.00 09/23/2021   FREET4 0.78 03/18/2021   FREET4 0.73 06/23/2012           Assessment & Plan:  1) Uncontrolled type 2 diabetes mellitus with hyperglycemia, without long-term current use of insulin ([place])  She presents today for her consultation with no meter or logs to review.  Her most recent A1c was 8.8% on 09/23/21.  She does not routinely monitor glucose as her meter is broken.  She admits to drinking mostly diet soda and artificially sweetened tea.  She eats 3 meals per day with snacks in between and does not engage in any routine physical activity.  She is having an eye exam next week and has been seen by podiatry in the past.  She denies any s/s of hypoglycemia.  - Kathryn Merritt has currently uncontrolled symptomatic type 2 DM since 61 years of age, with most recent A1c of 8.8 %.   -Recent labs reviewed.  - I had a long discussion with her about the progressive nature of diabetes and the pathology behind its complications. -her diabetes is complicated by CKD stage 3 and she remains at a high risk for more acute and chronic complications which include CAD, CVA, CKD, retinopathy, and neuropathy. These are all discussed in detail with her.  - I have counseled her on diet and weight management by adopting a carbohydrate restricted/protein rich diet. Patient is encouraged to switch to unprocessed or minimally processed complex starch and increased protein intake (animal or plant source), fruits, and vegetables. -  she is advised to stick to a routine mealtimes to eat 3 meals a day and avoid unnecessary snacks (to snack only to correct hypoglycemia).   - she acknowledges that there is a room for improvement in her food and drink choices. - Suggestion is made for her to avoid simple carbohydrates from her diet  including Cakes, Sweet Desserts, Ice Cream, Soda (diet and regular), Sweet Tea, Candies, Chips, Cookies, Store Bought Juices, Alcohol in Excess of 1-2 drinks a day, Artificial Sweeteners, Coffee Creamer, and "Sugar-free" Products. This will help patient to have more stable blood glucose profile and potentially avoid unintended weight gain.  The following Lifestyle Medicine recommendations according to [facility]) were discussed and offered to patient and she agrees to start the journey:  A. Whole Foods, Plant-based plate comprising of fruits and vegetables, plant-based proteins, whole-grain carbohydrates was discussed in detail with the patient.   A list for source of those nutrients were also provided to the patient.  Patient will use only water or unsweetened tea for hydration. B.  The need to stay away from risky substances including alcohol, smoking; obtaining 7 to 9 hours of restorative sleep, at least 150 minutes of moderate intensity exercise weekly, the importance of healthy social connections,  and stress reduction techniques were discussed. C.  A full color page of  Calorie density of various food groups per pound showing examples of each food groups was provided to the patient. - I have approached her with the following individualized plan to manage her diabetes and patient agrees:   -she is encouraged to start monitoring glucose 4 times daily, before meals and before bed, to log their readings on the clinic sheets provided, and bring them to review at follow up appointment in 2 weeks.  - Adjustment parameters are given to her for hypo and hyperglycemia in writing. - she is encouraged to call clinic for blood glucose levels less than 70 or above 300 mg /dl. - she is advised to continue Glimepiride 2 mg po twice daily with meals, therapeutically suitable for patient . - her Janumet and  Wilder Glade will be discontinued, risk outweighs benefit for this patient (due  to worsening renal failure and polyuria).  -I discussed and initiated Mounjaro 2.5 mg SQ weekly x 4 weeks, then she may advance to 5 mg SQ weekly thereafter if she tolerates well.  I demonstrated proper injection technique in the room with her today and she tolerated it well.  - Specific targets for  A1c; LDL, HDL, and Triglycerides were discussed with the patient.  2) Blood Pressure /Hypertension:  her blood pressure is controlled to target.   she is advised to continue her current medications including Norvasc 10 mg po daily, Metoprolol 50 mg po daily, and Lisinopril-HCT 20-25 mg p.o. daily with breakfast.  3) Lipids/Hyperlipidemia:    Review of her recent lipid panel from 09/23/21 showed controlled LDL at 64 and slightly elevated triglycerides of 162 .  she is advised to continue Crestor 20 mg daily at bedtime.  Side effects and precautions discussed with her.  4)  Weight/Diet:  her Body mass index is 49.1 kg/m.  -  clearly complicating her diabetes care.   she is a candidate for weight loss. I discussed with her the fact that loss of 5 - 10% of her  current body weight will have the most impact on her diabetes management.  Exercise, and detailed carbohydrates information provided  -  detailed on discharge instructions.  5) Vitamin D deficiency- Her most recent vitamin D level was 16.5 on 09/23/21.  She is currently on supplementation prescribed by her PCP.  6) Chronic Care/Health Maintenance: -she is on ACEI/ARB and Statin medications and is encouraged to initiate and continue to follow up with Ophthalmology, Dentist, Podiatrist at least yearly or according to recommendations, and advised to stay away from smoking. I have recommended yearly flu vaccine and pneumonia vaccine at least every 5 years; moderate intensity exercise for up to 150 minutes weekly; and sleep for at least 7 hours a day.  - she is advised to maintain close follow up with Renee Rival, FNP for primary care needs, as  well as her other providers for optimal and coordinated care.   - Time spent in this patient care: 60 min, of which > 50% was spent in counseling her about her diabetes and the rest reviewing her blood glucose logs, discussing her hypoglycemia and hyperglycemia episodes, reviewing her current and previous labs/studies (including abstraction from other facilities) and medications doses and developing a long term treatment plan based on the latest standards of care/guidelines; and documenting her care.    Please refer to Patient Instructions for Blood Glucose Monitoring and Insulin/Medications Dosing Guide" in media tab for additional information. Please also refer to "Patient Self Inventory" in the Media tab for reviewed elements of pertinent patient history.  Kathryn Merritt participated in the discussions, expressed understanding, and voiced agreement with the above plans.  All questions were answered to her satisfaction. she is encouraged to contact clinic should she have any questions or concerns prior to her return visit.     Follow up plan: - Return in about 2 weeks (around 10/15/2021) for Diabetes F/U, Bring meter and logs.    Rayetta Pigg, [facility] [address] [place], [place] [address] Phone: [phone] Fax: [phone]  10/01/2021, 4:04 PM

## 2021-10-02 ENCOUNTER — Ambulatory Visit (HOSPITAL_BASED_OUTPATIENT_CLINIC_OR_DEPARTMENT_OTHER): Payer: 59 | Attending: Cardiology | Admitting: Cardiology

## 2021-10-02 VITALS — Ht 63.0 in | Wt 262.0 lb

## 2021-10-02 DIAGNOSIS — G4733 Obstructive sleep apnea (adult) (pediatric): Secondary | ICD-10-CM | POA: Diagnosis present

## 2021-10-05 NOTE — Procedures (Signed)
° °  Patient Name: Kathryn Merritt, Kathryn Merritt Date: 10/02/2021 Gender: Female D.O.B: December 22, 1960 Age (years): 62 Referring Provider: Fransico Him MD, ABSM Height (inches): 63 Interpreting Physician: Fransico Him MD, ABSM Weight (lbs): 262 RPSGT: Jorge Ny BMI: 46 MRN: [id number] Neck Size: 15.50  CLINICAL INFORMATION The patient is referred for a CPAP titration to treat sleep apnea.  SLEEP STUDY TECHNIQUE As per the AASM Manual for the Scoring of Sleep and Associated Events v2.3 (April 2016) with a hypopnea requiring 4% desaturations.  The channels recorded and monitored were frontal, central and occipital EEG, electrooculogram (EOG), submentalis EMG (chin), nasal and oral airflow, thoracic and abdominal wall motion, anterior tibialis EMG, snore microphone, electrocardiogram, and pulse oximetry. Continuous positive airway pressure (CPAP) was initiated at the beginning of the study and titrated to treat sleep-disordered breathing.  MEDICATIONS Medications self-administered by patient taken the night of the study : N/A  TECHNICIAN COMMENTS Comments added by technician: Patient had more than two awakenings to use the bathroom. Patient was restless all through the night. Patient had difficulty initiating sleep. Comments added by scorer: N/A  RESPIRATORY PARAMETERS Optimal PAP Pressure (cm): 5  AHI at Optimal Pressure (/hr):0.6 Overall Minimal O2 (%):89.0  Supine % at Optimal Pressure (%):89.0 Minimal O2 at Optimal Pressure (%): 89.0   SLEEP ARCHITECTURE The study was initiated at 9:52:44 PM and ended at 4:51:16 AM.  Sleep onset time was 14.3 minutes and the sleep efficiency was 49.0%. The total sleep time was 205 minutes.  The patient spent 4.4% of the night in stage N1 sleep, 89.3% in stage N2 sleep, 0.0% in stage N3 and 6.3% in REM.Stage REM latency was 360.0 minutes  Wake after sleep onset was 199.2. Alpha intrusion was absent. Supine sleep was 15.61%.  CARDIAC DATA The 2  lead EKG demonstrated sinus rhythm. The mean heart rate was 56.9 beats per minute. Other EKG findings include: PVCs.  LEG MOVEMENT DATA The total Periodic Limb Movements of Sleep (PLMS) were 0. The PLMS index was 0.0. A PLMS index of <15 is considered normal in adults.  IMPRESSIONS - An optimal PAP pressure could not be selected for this patient based on the available study data. - Mild oxygen desaturations were observed during this titration (min O2 = 89.0%). - No snoring was audible during this study. - 2-lead EKG demonstrated: PVCs  DIAGNOSIS - Obstructive Sleep Apnea (G47.33)  RECOMMENDATIONS - Recommend ResMed CPAP at 5cm H2O with heated humidity and mask of choice.  - Avoid alcohol, sedatives and other CNS depressants that may worsen sleep apnea and disrupt normal sleep architecture. - Sleep hygiene should be reviewed to assess factors that may improve sleep quality. - Weight management and regular exercise should be initiated or continued. - Return to [facility] Center for re-evaluation after 6 weeks of therapy  [Electronically signed] 10/05/2021 09:10 PM  Fransico Him MD, ABSM Diplomate, American Board of Sleep Medicine

## 2021-10-06 ENCOUNTER — Ambulatory Visit: Payer: 59

## 2021-10-13 ENCOUNTER — Telehealth: Payer: Self-pay | Admitting: *Deleted

## 2021-10-13 ENCOUNTER — Other Ambulatory Visit: Payer: Self-pay

## 2021-10-13 ENCOUNTER — Ambulatory Visit: Payer: 59

## 2021-10-13 NOTE — Telephone Encounter (Signed)
-----   Message from Kathryn Margarita, MD sent at 10/05/2021  9:12 PM EST ----- Please let patient know that they had a successful PAP titration and let DME know that orders are in EPIC.  Please set up 6 week OV with me.

## 2021-10-13 NOTE — Telephone Encounter (Signed)
The patient has been notified of the result and verbalized understanding.  All questions (if any) were answered. Kathryn Merritt, [name] 10/13/2021 10:52 AM    Upon patient request DME selection is [name] Patient understands he will be contacted by [name] to set up his cpap. Patient understands to call if [name] does not contact him with new setup in a timely manner. Patient understands they will be called once confirmation has been received from [name]/ that they have received their new machine to schedule 10 week follow up appointment.   [name] notified of new cpap order  Please add to airview Patient was grateful for the call and thanked me

## 2021-10-14 LAB — HM DIABETES EYE EXAM

## 2021-10-15 ENCOUNTER — Ambulatory Visit (INDEPENDENT_AMBULATORY_CARE_PROVIDER_SITE_OTHER): Payer: 59 | Admitting: Nurse Practitioner

## 2021-10-15 ENCOUNTER — Other Ambulatory Visit: Payer: Self-pay

## 2021-10-15 ENCOUNTER — Encounter: Payer: Self-pay | Admitting: Nurse Practitioner

## 2021-10-15 VITALS — BP 113/74 | HR 57 | Ht 61.25 in | Wt 260.0 lb

## 2021-10-15 DIAGNOSIS — E782 Mixed hyperlipidemia: Secondary | ICD-10-CM | POA: Diagnosis not present

## 2021-10-15 DIAGNOSIS — E1165 Type 2 diabetes mellitus with hyperglycemia: Secondary | ICD-10-CM | POA: Diagnosis not present

## 2021-10-15 DIAGNOSIS — E559 Vitamin D deficiency, unspecified: Secondary | ICD-10-CM

## 2021-10-15 DIAGNOSIS — I10 Essential (primary) hypertension: Secondary | ICD-10-CM

## 2021-10-15 NOTE — Patient Instructions (Signed)

## 2021-10-15 NOTE — Progress Notes (Signed)
Endocrinology Follow Up Note       10/15/2021, 7:47 PM   Subjective:    Patient ID: Kathryn Merritt, female    DOB: 1961/05/21.  Kathryn Merritt is being seen in follow up after being seen in consultation for management of currently uncontrolled symptomatic diabetes requested by  Renee Rival, FNP.   Past Medical History:  Diagnosis Date   Chronic back pain    Diabetes mellitus    Edema leg    Hyperlipidemia    Hypertension    Joint pain    Kidney stones    Other malaise and fatigue    PAF (paroxysmal atrial fibrillation) ([place])    a. new diagnosis in 02/2021 --> s/p DCCV on 03/18/2021   RBBB    Sinus bradycardia    Sleep apnea     Past Surgical History:  Procedure Laterality Date   CARDIOVERSION N/A 03/18/2021   Procedure: CARDIOVERSION;  Surgeon: Satira Sark, MD;  Location: [facility];  Service: Cardiovascular;  Laterality: N/A;   CESAREAN SECTION     times 2   INCISION AND DRAINAGE ABSCESS Right    axilla   LITHOTRIPSY      Social History   Socioeconomic History   Marital status: Married    Spouse name: Not on file   Number of children: 2   Years of education: Not on file   Highest education level: Not on file  Occupational History   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  Tobacco Use   Smoking status: Never   Smokeless tobacco: Never  Vaping Use   Vaping Use: Never used  Substance and Sexual Activity   Alcohol use: No   Drug use: No   Sexual activity: Not Currently  Other Topics Concern   Not on file  Social History Narrative   Live with her husband, works at [employer] center,    Social Determinants of Health   Financial Resource Strain: Not on file  Food Insecurity: Not on file  Transportation Needs: Not on file  Physical Activity: Not on file  Stress: Not on file  Social Connections: Not on file    Family History  Problem Relation Age of  Onset   Hypertension Mother    Cancer Father    Hypertension Father    Cancer Brother    Heart disease Paternal Grandmother    Breast cancer Neg Hx    Colon cancer Neg Hx     Outpatient Encounter Medications as of 10/15/2021  Medication Sig   albuterol (VENTOLIN HFA) 108 (90 Base) MCG/ACT inhaler Inhale 2 puffs into the lungs every 6 (six) hours as needed for wheezing or shortness of breath.   apixaban (ELIQUIS) 5 MG TABS tablet TAKE ONE TABLET BY MOUTH TWICE DAILY   blood glucose meter kit and supplies Dispense based on patient and insurance preference. Use up to four times daily as directed. (FOR ICD-10 E10.9, E11.9).   Cholecalciferol (VITAMIN D3) 25 MCG (1000 UT) CAPS Take 1 capsule (1,000 Units total) by mouth daily.   glimepiride (AMARYL) 2 MG tablet Take 1 tablet (2 mg total) by mouth 2 (two) times daily.   lisinopril-hydrochlorothiazide (ZESTORETIC)  20-25 MG tablet Take 1 tablet by mouth daily   metoprolol succinate (TOPROL XL) 50 MG 24 hr tablet Take 1 tablet (50 mg total) by mouth daily. Take with or immediately following a meal.   omeprazole (PRILOSEC) 40 MG capsule Take 1 capsule (40 mg total) by mouth daily.   rosuvastatin (CRESTOR) 20 MG tablet Take 1 tablet (20 mg total) by mouth daily.   tirzepatide [facility]) 5 MG/0.5ML Pen Inject 5 mg into the skin once a week.   TRUE METRIX BLOOD GLUCOSE TEST test strip SMARTSIG:Via Meter 1-4 Times Daily   TRUEplus Lancets 30G MISC USE UP TO FOUR TIMES DAILY AS DIRECTED   amLODipine (NORVASC) 10 MG tablet Take 1 tablet (10 mg total) by mouth daily.   No facility-administered encounter medications on file as of 10/15/2021.    ALLERGIES: No Known Allergies  VACCINATION STATUS: Immunization History  Administered Date(s) Administered   Influenza,inj,Quad PF,6+ Mos 06/01/2021   Influenza,trivalent, recombinat, inj, PF 09/07/2014   Moderna SARS-COV2 Booster Vaccination 09/30/2020   Moderna Sars-Covid-2 Vaccination 11/20/2019,  12/18/2019    Diabetes She presents for her follow-up diabetic visit. She has type 2 diabetes mellitus. Onset time: diagnosed at approx age of 61. Her disease course has been improving. Hypoglycemia symptoms include sweats and tremors. Associated symptoms include fatigue and weight loss. Pertinent negatives for diabetes include no polydipsia and no polyuria. There are no hypoglycemic complications. Symptoms are improving. Diabetic complications include heart disease (Afib) and nephropathy. Risk factors for coronary artery disease include diabetes mellitus, dyslipidemia, family history, obesity, hypertension, post-menopausal and sedentary lifestyle. Current diabetic treatment includes oral agent (monotherapy) (and Mounjaro). She is compliant with treatment most of the time. Her weight is decreasing steadily. She is following a generally healthy diet. When asked about meal planning, she reported none. She has not had a previous visit with a dietitian. She rarely participates in exercise. Her home blood glucose trend is decreasing steadily. (She presents today with her logs, no meter, showing improving glycemic profile overall.  She says her system is still getting used to the new medication changes but she is seeing a difference.  She continues to work on her diet as well.  She is finishing her starter dose of Mounjaro 2.5 mg today then she will advance to the 5 mg.  She denies any significant hypoglycemia, says she did get symptoms when her glucose dropped in the 80s.) An ACE inhibitor/angiotensin II receptor blocker is being taken. She sees a podiatrist.Eye exam is current (having next exam sometime next week).  Hypertension This is a chronic problem. The current episode started more than 1 year ago. The problem has been resolved since onset. The problem is controlled. Associated symptoms include sweats. There are no associated agents to hypertension. Risk factors for coronary artery disease include diabetes  mellitus, dyslipidemia, family history, obesity, post-menopausal state and sedentary lifestyle. Past treatments include diuretics, beta blockers, ACE inhibitors and calcium channel blockers. The current treatment provides moderate improvement. Compliance problems include diet and exercise.  Hypertensive end-organ damage includes kidney disease. Identifiable causes of hypertension include chronic renal disease.  Hyperlipidemia This is a chronic problem. The current episode started more than 1 year ago. The problem is uncontrolled. Recent lipid tests were reviewed and are variable. Exacerbating diseases include chronic renal disease, diabetes and obesity. Factors aggravating her hyperlipidemia include beta blockers, fatty foods and thiazides. Current antihyperlipidemic treatment includes statins. The current treatment provides mild improvement of lipids. Compliance problems include adherence to diet and adherence  to exercise.  Risk factors for coronary artery disease include diabetes mellitus, dyslipidemia, family history, obesity, hypertension, a sedentary lifestyle and post-menopausal.    Review of systems  Constitutional: + Minimally fluctuating body weight,  current Body mass index is 48.73 kg/m. , no fatigue, no subjective hyperthermia, no subjective hypothermia Eyes: no blurry vision, no xerophthalmia ENT: no sore throat, no nodules palpated in throat, no dysphagia/odynophagia, no hoarseness Cardiovascular: no chest pain, no shortness of breath, no palpitations, no leg swelling Respiratory: no cough, no shortness of breath Gastrointestinal: no nausea/vomiting/diarrhea Musculoskeletal: no muscle/joint aches Skin: no rashes, no hyperemia Neurological: no tremors, no numbness, no tingling, no dizziness Psychiatric: no depression, no anxiety  Objective:     BP 113/74    Pulse (!) 57    Ht 5' 1.25" (1.556 m)    Wt 260 lb (117.9 kg)    SpO2 99%    BMI 48.73 kg/m   Wt Readings from Last 3  Encounters:  10/15/21 260 lb (117.9 kg)  10/02/21 262 lb (118.8 kg)  10/01/21 262 lb (118.8 kg)     BP Readings from Last 3 Encounters:  10/15/21 113/74  10/01/21 121/73  09/23/21 118/76      Physical Exam- Limited  Constitutional:  Body mass index is 48.73 kg/m. , not in acute distress, normal state of mind Eyes:  EOMI, no exophthalmos Neck: Supple Cardiovascular: RRR, no murmurs, rubs, or gallops, no edema Respiratory: Adequate breathing efforts, no crackles, rales, rhonchi, or wheezing Musculoskeletal: no gross deformities, strength intact in all four extremities, no gross restriction of joint movements Skin:  no rashes, no hyperemia Neurological: no tremor with outstretched hands    CMP ( most recent) CMP     Component Value Date/Time   NA 144 09/23/2021 1223   K 4.5 09/23/2021 1223   CL 105 09/23/2021 1223   CO2 23 09/23/2021 1223   GLUCOSE 95 09/23/2021 1223   GLUCOSE 192 (H) 06/11/2021 1024   BUN 29 (H) 09/23/2021 1223   CREATININE 1.42 (H) 09/23/2021 1223   CALCIUM 10.4 (H) 09/23/2021 1223   PROT 6.7 09/23/2021 1223   ALBUMIN 4.4 09/23/2021 1223   AST 18 09/23/2021 1223   ALT 20 09/23/2021 1223   ALKPHOS 55 09/23/2021 1223   BILITOT 0.4 09/23/2021 1223   GFRNONAA >60 06/11/2021 1024   GFRAA >60 02/22/2020 0924     Diabetic Labs (most recent): Lab Results  Component Value Date   HGBA1C 8.8 (H) 09/23/2021   HGBA1C 9.4 (H) 06/23/2021   HGBA1C 12.2 (H) 03/18/2021     Lipid Panel ( most recent) Lipid Panel     Component Value Date/Time   CHOL 137 09/23/2021 1223   TRIG 162 (H) 09/23/2021 1223   HDL 45 09/23/2021 1223   CHOLHDL 3.0 09/23/2021 1223   CHOLHDL 3.0 05/06/2021 0833   VLDL 18 05/06/2021 0833   LDLCALC 64 09/23/2021 1223   LABVLDL 28 09/23/2021 1223      Lab Results  Component Value Date   TSH 4.550 (H) 09/23/2021   TSH 6.135 (H) 03/18/2021   TSH 1.74 06/23/2012   FREET4 1.00 09/23/2021   FREET4 0.78 03/18/2021   FREET4 0.73  06/23/2012           Assessment & Plan:   1) Uncontrolled type 2 diabetes mellitus with hyperglycemia, without long-term current use of insulin ([place])  She presents today with her logs, no meter, showing improving glycemic profile overall.  She was not due for another A1c  today.  She says her system is still getting used to the new medication changes but she is seeing a difference.  She continues to work on her diet as well.  She is finishing her starter dose of Mounjaro 2.5 mg today then she will advance to the 5 mg.  She denies any significant hypoglycemia, says she did get symptoms when her glucose dropped in the 80s.  - Kathryn Merritt has currently uncontrolled symptomatic type 2 DM since 61 years of age, with most recent A1c of 8.8 %.   -Recent labs reviewed.  - Nutritional counseling repeated at each appointment due to patients tendency to fall back in to old habits.  - The patient admits there is a room for improvement in their diet and drink choices. -  Suggestion is made for the patient to avoid simple carbohydrates from their diet including Cakes, Sweet Desserts / Pastries, Ice Cream, Soda (diet and regular), Sweet Tea, Candies, Chips, Cookies, Sweet Pastries, Store Bought Juices, Alcohol in Excess of 1-2 drinks a day, Artificial Sweeteners, Coffee Creamer, and "Sugar-free" Products. This will help patient to have stable blood glucose profile and potentially avoid unintended weight gain.   - I encouraged the patient to switch to unprocessed or minimally processed complex starch and increased protein intake (animal or plant source), fruits, and vegetables.   - Patient is advised to stick to a routine mealtimes to eat 3 meals a day and avoid unnecessary snacks (to snack only to correct hypoglycemia).  The following Lifestyle Medicine recommendations according to [facility]) were discussed and offered to patient and she agrees to start the journey:   A. Whole Foods, Plant-based plate comprising of fruits and vegetables, plant-based proteins, whole-grain carbohydrates was discussed in detail with the patient.   A list for source of those nutrients were also provided to the patient.  Patient will use only water or unsweetened tea for hydration. B.  The need to stay away from risky substances including alcohol, smoking; obtaining 7 to 9 hours of restorative sleep, at least 150 minutes of moderate intensity exercise weekly, the importance of healthy social connections,  and stress reduction techniques were discussed. C.  A full color page of  Calorie density of various food groups per pound showing examples of each food groups was provided to the patient. - I have approached her with the following individualized plan to manage her diabetes and patient agrees:   -She is advised to continue Mounjaro 5 mg SQ weekly and Glimepiride 2 mg po twice daily with meals.  -she is encouraged to continue monitoring blood glucose twice daily, before breakfast and before bed, and to call the clinic if she has readings less than 70 or above 300 for 3 tests in a row.  - Adjustment parameters are given to her for hypo and hyperglycemia in writing.  - Specific targets for  A1c; LDL, HDL, and Triglycerides were discussed with the patient.  2) Blood Pressure /Hypertension:  her blood pressure is controlled to target.   she is advised to continue her current medications including Norvasc 10 mg po daily, Metoprolol 50 mg po daily, and Lisinopril-HCT 20-25 mg p.o. daily with breakfast.  3) Lipids/Hyperlipidemia:    Review of her recent lipid panel from 09/23/21 showed controlled LDL at 64 and slightly elevated triglycerides of 162 .  she is advised to continue Crestor 20 mg daily at bedtime.  Side effects and precautions discussed with her.  4)  Weight/Diet:  her Body mass index is 48.73 kg/m.  -  clearly complicating her diabetes care.   she is a candidate for weight  loss. I discussed with her the fact that loss of 5 - 10% of her  current body weight will have the most impact on her diabetes management.  Exercise, and detailed carbohydrates information provided  -  detailed on discharge instructions.  5) Vitamin D deficiency- Her most recent vitamin D level was 16.5 on 09/23/21.  She is currently on supplementation prescribed by her PCP.  6) Chronic Care/Health Maintenance: -she is on ACEI/ARB and Statin medications and is encouraged to initiate and continue to follow up with Ophthalmology, Dentist, Podiatrist at least yearly or according to recommendations, and advised to stay away from smoking. I have recommended yearly flu vaccine and pneumonia vaccine at least every 5 years; moderate intensity exercise for up to 150 minutes weekly; and sleep for at least 7 hours a day.  - she is advised to maintain close follow up with Renee Rival, FNP for primary care needs, as well as her other providers for optimal and coordinated care.     I spent 45 minutes in the care of the patient today including review of labs from [place], Lipids, Thyroid Function, Hematology (current and previous including abstractions from other facilities); face-to-face time discussing  her blood glucose readings/logs, discussing hypoglycemia and hyperglycemia episodes and symptoms, medications doses, her options of short and long term treatment based on the latest standards of care / guidelines;  discussion about incorporating lifestyle medicine;  and documenting the encounter.    Please refer to Patient Instructions for Blood Glucose Monitoring and Insulin/Medications Dosing Guide"  in media tab for additional information. Please  also refer to " Patient Self Inventory" in the Media  tab for reviewed elements of pertinent patient history.  Kathryn Merritt participated in the discussions, expressed understanding, and voiced agreement with the above plans.  All questions were answered to her  satisfaction. she is encouraged to contact clinic should she have any questions or concerns prior to her return visit.     Follow up plan: - Return in about 3 months (around 01/13/2022) for Diabetes F/U with A1c in office, No previsit labs, Bring meter and logs.   Rayetta Pigg, [facility] [address] [place], [place] [address] Phone: [phone] Fax: [phone]  10/15/2021, 7:47 PM

## 2021-10-17 ENCOUNTER — Other Ambulatory Visit: Payer: Self-pay | Admitting: Internal Medicine

## 2021-10-17 DIAGNOSIS — K219 Gastro-esophageal reflux disease without esophagitis: Secondary | ICD-10-CM

## 2021-10-20 ENCOUNTER — Telehealth: Payer: Self-pay

## 2021-10-20 NOTE — Telephone Encounter (Signed)
Called pt no answer left vm to contact us back

## 2021-10-20 NOTE — Telephone Encounter (Signed)
Patient returning call. Please return call at [phone]

## 2021-10-20 NOTE — Telephone Encounter (Signed)
Patient calling about eye exam from [phone]. please call patient back at # [phone]

## 2021-10-21 ENCOUNTER — Telehealth: Payer: Self-pay

## 2021-10-21 ENCOUNTER — Other Ambulatory Visit: Payer: Self-pay

## 2021-10-21 MED ORDER — GLIMEPIRIDE 2 MG PO TABS: 2.0000 mg | ORAL_TABLET | Freq: Two times a day (BID) | ORAL | 3 refills | Status: DC

## 2021-10-21 NOTE — Telephone Encounter (Signed)
Spoke with pt advised results have not came back yet. Pt verbalized understanding

## 2021-10-21 NOTE — Telephone Encounter (Signed)
Patient returning call about eye exam results, please call back back at work #  [phone]

## 2021-10-23 ENCOUNTER — Ambulatory Visit (INDEPENDENT_AMBULATORY_CARE_PROVIDER_SITE_OTHER): Payer: 59 | Admitting: Nurse Practitioner

## 2021-10-23 ENCOUNTER — Encounter: Payer: Self-pay | Admitting: Nurse Practitioner

## 2021-10-23 ENCOUNTER — Other Ambulatory Visit: Payer: Self-pay

## 2021-10-23 VITALS — BP 126/100 | HR 76 | Ht 63.0 in | Wt 258.0 lb

## 2021-10-23 DIAGNOSIS — R319 Hematuria, unspecified: Secondary | ICD-10-CM | POA: Diagnosis not present

## 2021-10-23 DIAGNOSIS — N390 Urinary tract infection, site not specified: Secondary | ICD-10-CM | POA: Diagnosis not present

## 2021-10-23 DIAGNOSIS — E038 Other specified hypothyroidism: Secondary | ICD-10-CM

## 2021-10-23 DIAGNOSIS — R1031 Right lower quadrant pain: Secondary | ICD-10-CM | POA: Diagnosis not present

## 2021-10-23 DIAGNOSIS — R109 Unspecified abdominal pain: Secondary | ICD-10-CM | POA: Insufficient documentation

## 2021-10-23 LAB — POCT URINALYSIS DIP (CLINITEK)
Bilirubin, UA: NEGATIVE
Glucose, UA: NEGATIVE mg/dL
Ketones, POC UA: NEGATIVE mg/dL
Leukocytes, UA: NEGATIVE
Nitrite, UA: NEGATIVE
POC PROTEIN,UA: NEGATIVE
Spec Grav, UA: 1.015
Urobilinogen, UA: 0.2 U/dL
pH, UA: 6

## 2021-10-23 MED ORDER — SULFAMETHOXAZOLE-TRIMETHOPRIM 800-160 MG PO TABS: 1.0000 | ORAL_TABLET | Freq: Two times a day (BID) | ORAL | 0 refills | Status: DC

## 2021-10-23 NOTE — Patient Instructions (Signed)
Please take bactrim DS one tablet two times daily for 5  days for UTI.   It is important that you exercise regularly at least 30 minutes 5 times a week.  Think about what you will eat, plan ahead. Choose " clean, green, fresh or frozen" over canned, processed or packaged foods which are more sugary, salty and fatty. 70 to 75% of food eaten should be vegetables and fruit. Three meals at set times with snacks allowed between meals, but they must be fruit or vegetables. Aim to eat over a 12 hour period , example 7 am to 7 pm, and STOP after  your last meal of the day. Drink water,generally about 64 ounces per day, no other drink is as healthy. Fruit juice is best enjoyed in a healthy way, by EATING the fruit.  Thanks for choosing [facility], we consider it a privelige to serve you.

## 2021-10-23 NOTE — Assessment & Plan Note (Addendum)
Right lower abdominal pain Obtain  UA and culture.  Dipstick UA shows trace blood, although UA negative will start Antibiotics for empiric treatment.

## 2021-10-23 NOTE — Assessment & Plan Note (Addendum)
Obtain  UA and culture.  Dipstick UA shows trace blood, although UA negative will start Antibiotics for empiric treatment.  Use tylenol as needed for fever and pain.  RX bactrim DS  Drink plenty of water to stay hydrated

## 2021-10-23 NOTE — Assessment & Plan Note (Signed)
Lab Results  Component Value Date   TSH 4.550 (H) 09/23/2021  recheck TSH, T4 today

## 2021-10-23 NOTE — Progress Notes (Signed)
° °  Kathryn Merritt     MRN: [id number]      DOB: 23-Jun-1961   HPI Kathryn Merritt is here for complaints of right abdominal pain since 7 days ago, the pain started in her pelvic area first and moved to her right abdomen, has constant throbbing aching pain  6/10 tylenol extra strength not helping. In June 2021 she had abdominal pain and she was told that her liver enzymes were elevated. After she eats she feels a little pressure. She has been having increased urinary frequency, nausea. denies  dysuria, vomiting, constipation, diarrhea, fever , chills,   ROS Denies recent fever or chills. Denies sinus pressure, nasal congestion, ear pain or sore throat. Denies chest congestion, productive cough or wheezing. Denies chest pains, palpitations and leg swelling   Has right side abdominal pain, right pelvic pain, denies constipation bloody stool, diarrhea, constipation.  Has urinary frequency denies  hesitancy or incontinence. Denies joint pain, swelling and limitation in mobility.     PE  BP (!) 126/100 (BP Location: Left Arm, Cuff Size: Normal)    Pulse 76    Ht 5\' 3"  (1.6 m)    Wt 258 lb (117 kg)    SpO2 98%    BMI 45.70 kg/m   Patient alert and oriented and in no cardiopulmonary distress.  Chest: Clear to auscultation bilaterally.  CVS: S1, S2 no murmurs, no S3.Regular rate.  ABD: obeese Soft non tender, no masses palpated, right abdomen tenderness on palpation.   Ext: No edema  MS: Adequate ROM spine, shoulders, hips and knees.  Psych: Good eye contact, normal affect. Memory intact not anxious or depressed appearing.  CNS: CN 2-12 intact, power,  normal throughout.no focal deficits noted.   Assessment & Plan

## 2021-10-24 ENCOUNTER — Emergency Department (HOSPITAL_COMMUNITY): Payer: 59

## 2021-10-24 ENCOUNTER — Emergency Department (HOSPITAL_COMMUNITY)
Admission: EM | Admit: 2021-10-24 | Discharge: 2021-10-24 | Disposition: A | Discharge status: Home / Self Care | Payer: 59 | Attending: Emergency Medicine | Admitting: Emergency Medicine

## 2021-10-24 ENCOUNTER — Encounter (HOSPITAL_COMMUNITY): Payer: Self-pay | Admitting: *Deleted

## 2021-10-24 DIAGNOSIS — Z79899 Other long term (current) drug therapy: Secondary | ICD-10-CM | POA: Insufficient documentation

## 2021-10-24 DIAGNOSIS — E119 Type 2 diabetes mellitus without complications: Secondary | ICD-10-CM | POA: Diagnosis not present

## 2021-10-24 DIAGNOSIS — R7989 Other specified abnormal findings of blood chemistry: Secondary | ICD-10-CM | POA: Diagnosis not present

## 2021-10-24 DIAGNOSIS — R109 Unspecified abdominal pain: Secondary | ICD-10-CM | POA: Diagnosis present

## 2021-10-24 DIAGNOSIS — I10 Essential (primary) hypertension: Secondary | ICD-10-CM | POA: Insufficient documentation

## 2021-10-24 DIAGNOSIS — R110 Nausea: Secondary | ICD-10-CM | POA: Insufficient documentation

## 2021-10-24 DIAGNOSIS — Z87442 Personal history of urinary calculi: Secondary | ICD-10-CM | POA: Diagnosis not present

## 2021-10-24 DIAGNOSIS — Z7901 Long term (current) use of anticoagulants: Secondary | ICD-10-CM | POA: Insufficient documentation

## 2021-10-24 LAB — COMPREHENSIVE METABOLIC PANEL
ALT: 22 U/L (ref 0–44)
AST: 22 U/L (ref 15–41)
Albumin: 3.9 g/dL (ref 3.5–5.0)
Alkaline Phosphatase: 52 U/L (ref 38–126)
Anion gap: 4 — ABNORMAL LOW (ref 5–15)
BUN: 19 mg/dL (ref 6–20)
CO2: 27 mmol/L (ref 22–32)
Calcium: 10.2 mg/dL (ref 8.9–10.3)
Chloride: 107 mmol/L (ref 98–111)
Creatinine, Ser: 1.18 mg/dL — ABNORMAL HIGH (ref 0.44–1.00)
GFR, Estimated: 53 mL/min — ABNORMAL LOW (ref >60)
Glucose, Bld: 86 mg/dL (ref 70–99)
Potassium: 3.8 mmol/L (ref 3.5–5.1)
Sodium: 138 mmol/L (ref 135–145)
Total Bilirubin: 0.3 mg/dL (ref 0.3–1.2)
Total Protein: 7.4 g/dL (ref 6.5–8.1)

## 2021-10-24 LAB — TSH+FREE T4
Free T4: 1.01 ng/dL (ref 0.82–1.77)
TSH: 3.69 u[IU]/mL (ref 0.450–4.500)

## 2021-10-24 LAB — CBC WITH DIFFERENTIAL/PLATELET
Abs Immature Granulocytes: 0.01 10*3/uL (ref 0.00–0.07)
Basophils Absolute: 0.1 10*3/uL (ref 0.0–0.1)
Basophils Relative: 1 %
Eosinophils Absolute: 0.2 10*3/uL (ref 0.0–0.5)
Eosinophils Relative: 4 %
HCT: 43.7 % (ref 36.0–46.0)
Hemoglobin: 13.7 g/dL (ref 12.0–15.0)
Immature Granulocytes: 0 %
Lymphocytes Relative: 40 %
Lymphs Abs: 2.5 10*3/uL (ref 0.7–4.0)
MCH: 28.4 pg (ref 26.0–34.0)
MCHC: 31.4 g/dL (ref 30.0–36.0)
MCV: 90.7 fL (ref 80.0–100.0)
Monocytes Absolute: 0.5 10*3/uL (ref 0.1–1.0)
Monocytes Relative: 8 %
Neutro Abs: 3 10*3/uL (ref 1.7–7.7)
Neutrophils Relative %: 47 %
Platelets: 227 10*3/uL (ref 150–400)
RBC: 4.82 MIL/uL (ref 3.87–5.11)
RDW: 13.5 % (ref 11.5–15.5)
WBC: 6.3 10*3/uL (ref 4.0–10.5)
nRBC: 0 % (ref 0.0–0.2)

## 2021-10-24 LAB — URINALYSIS, ROUTINE W REFLEX MICROSCOPIC
Bilirubin Urine: NEGATIVE
Glucose, UA: NEGATIVE mg/dL
Hgb urine dipstick: NEGATIVE
Ketones, ur: NEGATIVE mg/dL
Leukocytes,Ua: NEGATIVE
Nitrite: NEGATIVE
Protein, ur: NEGATIVE mg/dL
Specific Gravity, Urine: 1.01 (ref 1.005–1.030)
pH: 7 (ref 5.0–8.0)

## 2021-10-24 LAB — LIPASE, BLOOD: Lipase: 42 U/L (ref 11–51)

## 2021-10-24 MED ORDER — MORPHINE SULFATE (PF) 4 MG/ML IV SOLN
4.0000 mg | Freq: Once | INTRAVENOUS | Status: AC
  Administered 2021-10-24: 4 mg via INTRAVENOUS
  Filled 2021-10-24: qty 1

## 2021-10-24 MED ORDER — LIDOCAINE 5 % EX PTCH: 1.0000 | MEDICATED_PATCH | CUTANEOUS | 0 refills | Status: DC

## 2021-10-24 MED ORDER — LIDOCAINE 5 % EX PTCH
1.0000 | MEDICATED_PATCH | CUTANEOUS | Status: DC
  Administered 2021-10-24: 1 via TRANSDERMAL
  Filled 2021-10-24: qty 1

## 2021-10-24 MED ORDER — ONDANSETRON HCL 4 MG/2ML IJ SOLN
4.0000 mg | Freq: Once | INTRAMUSCULAR | Status: AC
  Administered 2021-10-24: 4 mg via INTRAVENOUS
  Filled 2021-10-24: qty 2

## 2021-10-24 MED ORDER — SODIUM CHLORIDE 0.9 % IV BOLUS
1000.0000 mL | Freq: Once | INTRAVENOUS | Status: AC
  Administered 2021-10-24: 1000 mL via INTRAVENOUS

## 2021-10-24 NOTE — Discharge Instructions (Signed)
Your work-up today was reassuring.  There is no signs of urinary tract infection, kidney stone, systemic infection and your blood work.  There was some narrowing in the lumbar spine which could be the source of your back pain.  I would follow-up with an orthopedic group, patient is appended for above.  Please use the Lidoderm patch over the area that hurts as needed, he can also continue taking Tylenol for the pain.  Follow-up with your primary doctor next week.  I do not feel strongly need to continue taking the Bactrim as there is no evidence of UTI in the work-up today.

## 2021-10-24 NOTE — ED Triage Notes (Signed)
Pain in right flank for the past week, states she was started on treatment for a UTI yesterday

## 2021-10-24 NOTE — Progress Notes (Signed)
Pt transported to CT ?

## 2021-10-24 NOTE — ED Provider Notes (Signed)
[facility] EMERGENCY DEPARTMENT Provider Note   CSN: [id number] Arrival date & time: 10/24/21  1123     History  Chief Complaint  Patient presents with   Flank Pain    Kathryn Merritt is a 61 y.o. female.   Flank Pain   Patient presents with right flank pain.  States it became constant 1 week ago, the only relief she is had is from a heating pad.  Pain associate with nausea but no vomiting.  No dysuria or frank hematuria.  She has tried Tylenol and a couple doses of Advil which have not improved the pain.  There is associated nausea but no vomiting.  Was seen by her primary yesterday, there is hematuria but negative for leukocytes or nitrites.  Was started empirically on Bactrim, patient's had 3 doses and no relief of her pain.  History of kidney stone some many years ago requiring lithotripsy.  PMH notable for diabetes, PAF on Eliquis, hypertension, hyperlipidemia.  Surgical history notable for prior C-section, lithotripsy.   Home Medications Prior to Admission medications   Medication Sig Start Date End Date Taking? Authorizing Provider  lidocaine (LIDODERM) 5 % Place 1 patch onto the skin daily. Remove & Discard patch within 12 hours or as directed by MD 10/24/21  Yes Sherrill Raring, PA-C  albuterol (VENTOLIN HFA) 108 (90 Base) MCG/ACT inhaler Inhale 2 puffs into the lungs every 6 (six) hours as needed for wheezing or shortness of breath. 09/09/21   Noreene Larsson, NP  amLODipine (NORVASC) 10 MG tablet Take 1 tablet (10 mg total) by mouth daily. 03/25/21 10/01/21  Josue Hector, MD  apixaban (ELIQUIS) 5 MG TABS tablet TAKE ONE TABLET BY MOUTH TWICE DAILY 09/28/21   Josue Hector, MD  blood glucose meter kit and supplies Dispense based on patient and insurance preference. Use up to four times daily as directed. (FOR ICD-10 E10.9, E11.9). 10/01/21   Brita Romp, NP  Cholecalciferol (VITAMIN D3) 25 MCG (1000 UT) CAPS Take 1 capsule (1,000 Units total) by mouth daily. 09/24/21    Paseda, Dewaine Conger, FNP  glimepiride (AMARYL) 2 MG tablet Take 1 tablet (2 mg total) by mouth 2 (two) times daily. 10/21/21   Renee Rival, FNP  lisinopril-hydrochlorothiazide (ZESTORETIC) 20-25 MG tablet Take 1 tablet by mouth daily 07/20/21   Noreene Larsson, NP  metoprolol succinate (TOPROL XL) 50 MG 24 hr tablet Take 1 tablet (50 mg total) by mouth daily. Take with or immediately following a meal. 03/25/21   Josue Hector, MD  omeprazole (PRILOSEC) 40 MG capsule Take 1 capsule (40 mg total) by mouth daily. 10/19/21   Paseda, Dewaine Conger, FNP  rosuvastatin (CRESTOR) 20 MG tablet Take 1 tablet (20 mg total) by mouth daily. 09/24/21   Paseda, Dewaine Conger, FNP  sulfamethoxazole-trimethoprim (BACTRIM DS) 800-160 MG tablet Take 1 tablet by mouth 2 (two) times daily. 10/23/21   Paseda, Dewaine Conger, FNP  tirzepatide [facility]) 5 MG/0.5ML Pen Inject 5 mg into the skin once a week. 10/01/21   Brita Romp, NP  TRUE METRIX BLOOD GLUCOSE TEST test strip SMARTSIG:Via Meter 1-4 Times Daily 10/01/21   [provider]  TRUEplus Lancets 30G MISC USE UP TO FOUR TIMES DAILY AS DIRECTED 10/01/21   [provider]      Allergies    Patient has no known allergies.    Review of Systems   Review of Systems  Genitourinary:  Positive for flank pain.   Physical Exam  Updated Vital Signs BP (!) 151/67    Pulse (!) 58    Temp 98.2 F (36.8 C) (Oral)    Resp 20    Ht '5\' 3"'  (1.6 m)    Wt 117 kg    SpO2 97%    BMI 45.69 kg/m  Physical Exam Vitals and nursing note reviewed. Exam conducted with a chaperone present.  Constitutional:      Appearance: Normal appearance. She is obese.     Comments: BMI 45.69  HENT:     Head: Normocephalic and atraumatic.  Eyes:     General: No scleral icterus.       Right eye: No discharge.        Left eye: No discharge.     Extraocular Movements: Extraocular movements intact.     Pupils: Pupils are equal, round, and reactive to light.  Cardiovascular:      Rate and Rhythm: Normal rate. Rhythm irregular.     Pulses: Normal pulses.     Heart sounds: Normal heart sounds. No murmur heard.   No friction rub. No gallop.  Pulmonary:     Effort: Pulmonary effort is normal. No respiratory distress.     Breath sounds: Normal breath sounds.  Abdominal:     General: Abdomen is flat. Bowel sounds are normal. There is no distension.     Palpations: Abdomen is soft.     Tenderness: There is no abdominal tenderness. There is right CVA tenderness.  Skin:    General: Skin is warm and dry.     Coloration: Skin is not jaundiced.  Neurological:     Mental Status: She is alert. Mental status is at baseline.     Coordination: Coordination normal.    ED Results / Procedures / Treatments   Labs (all labs ordered are listed, but only abnormal results are displayed) Labs Reviewed  COMPREHENSIVE METABOLIC PANEL - Abnormal; Notable for the following components:      Result Value   Creatinine, Ser 1.18 (*)    GFR, Estimated 53 (*)    Anion gap 4 (*)    All other components within normal limits  CBC WITH DIFFERENTIAL/PLATELET  LIPASE, BLOOD  URINALYSIS, ROUTINE W REFLEX MICROSCOPIC    EKG None  Radiology CT Renal Stone Study  Result Date: 10/24/2021 CLINICAL DATA:  Right flank pain EXAM: CT ABDOMEN AND PELVIS WITHOUT CONTRAST TECHNIQUE: Multidetector CT imaging of the abdomen and pelvis was performed following the standard protocol without IV contrast. RADIATION DOSE REDUCTION: This exam was performed according to the departmental dose-optimization program which includes automated exposure control, adjustment of the mA and/or kV according to patient size and/or use of iterative reconstruction technique. COMPARISON:  11/03/2014 FINDINGS: Lower chest: No acute abnormality. Hepatobiliary: No focal liver abnormality is seen. No gallstones, gallbladder wall thickening, or biliary dilatation. Pancreas: Unremarkable. No pancreatic ductal dilatation or surrounding  inflammatory changes. Spleen: Normal in size without focal abnormality. Adrenals/Urinary Tract: Normal adrenal glands. Nonobstructing 3 mm right renal calculus. Nonobstructing 2 mm left renal calculus. No ureterolithiasis. No obstructive uropathy. Normal bladder. Stomach/Bowel: Stomach is within normal limits. Appendix appears normal. No evidence of bowel wall thickening, distention, or inflammatory changes. Vascular/Lymphatic: Aortic atherosclerosis. No enlarged abdominal or pelvic lymph nodes. Reproductive: Uterus and bilateral adnexa are unremarkable. Other: Fat containing umbilical hernia.  No abdominopelvic ascites. Musculoskeletal: No acute osseous abnormality. No aggressive osseous lesion. Degenerative disease with disc height loss at L2-3, L3-4, L4-5. Bilateral facet arthropathy of the lower lumbar spine. Enlarged left  L5 transverse process with abnormal articulation with the sacrum. IMPRESSION: 1. No acute abdominal or pelvic pathology. 2. Nonobstructing bilateral nephrolithiasis. 3. Aortic Atherosclerosis (ICD10-I70.0). Electronically Signed   By: Kathreen Devoid M.D.   On: 10/24/2021 12:43    Procedures Procedures    Medications Ordered in ED Medications  lidocaine (LIDODERM) 5 % 1 patch (1 patch Transdermal Patch Applied 10/24/21 1220)  sodium chloride 0.9 % bolus 1,000 mL (0 mLs Intravenous Stopped 10/24/21 1319)  morphine (PF) 4 MG/ML injection 4 mg (4 mg Intravenous Given 10/24/21 1219)  ondansetron (ZOFRAN) injection 4 mg (4 mg Intravenous Given 10/24/21 1219)    ED Course/ Medical Decision Making/ A&P                           Medical Decision Making Amount and/or Complexity of Data Reviewed Labs: ordered. Radiology: ordered.  Risk Prescription drug management.   This patient presents to the ED for concern of right flank pain, this involves an extensive number of treatment options, and is a complaint that carries with it a high risk of complications and morbidity.  The differential  diagnosis includes nephrolithiasis, muscle strain, UTI, Pilo, other   Co morbidities that complicate the patient evaluation: N/A   Additional history obtained: -Additional history obtained from primary care visit yesterday.  I personally reviewed that visit as well as the urine results which are notable for mild amount of hemoglobin but no nitrites or leukocytes. -External records from outside source obtained and reviewed including: Chart review including previous notes, labs, imaging, consultation notes   Lab Tests: -I ordered, reviewed, and interpreted labs.  The pertinent results include: No leukocytosis or anemia, no gross electrolyte derangement.  Creatinine slightly elevated 1.18 which is improved from previous creatinine of 1.4 months ago.  Lipase negative, urine not notable for any UTI symptoms, leukocytes, hemoglobin.   Imaging Studies ordered: -I ordered imaging studies including CT renal -I independently visualized and interpreted imaging which showed no acute process -I agree with the radiologist interpretation   Medicines ordered and prescription drug management: -I ordered medication including Lidoderm, morphine, fluid for pain -Reevaluation of the patient after these medicines showed that the patient improved -I have reviewed the patients home medicines and have made adjustments as needed   ED Course: Patient presents with right flank pain.  Her abdomen is soft, there is no peritoneal signs.  There is some right CVA tenderness, no sciatic pain.  No focal deficits or lateralizing symptoms.  Work-up does not show any signs of infected urine, I doubt this is Pilo.  CT renal negative for any nephrolithiasis, additionally no hydronephrosis to explain her pain.  Not having any dysuria or hematuria making UTI again less likely, unclear if patient needs to continue the Bactrim given unclear finding in the office yesterday and alleviation of pain with Lidoderm.  I suspect the  patient's pain is more likely muscle.  There is no leukocytosis or anemia, no evidence of underlying electrolyte derangement or systemic infection.  Her creatinine is elevated but actually improved from prior.  Do not suspect an acute AKI or renal failure.  Discussed the results with the patient, she is in agreement to follow-up with PCP.  Also given orthopedic information if the pain persist.   Reevaluation: After the interventions noted above, I reevaluated the patient and found that they have :resolved   Dispostion: D/C         Final Clinical Impression(s) / ED  Diagnoses Final diagnoses:  Right flank pain    Rx / DC Orders ED Discharge Orders          Ordered    lidocaine (LIDODERM) 5 %  Every 24 hours        10/24/21 1316              Sherrill Raring, PA-C 10/24/21 1346    Godfrey Pick, MD 10/24/21 1737

## 2021-10-26 ENCOUNTER — Telehealth: Payer: Self-pay

## 2021-10-26 NOTE — Telephone Encounter (Signed)
Spoke with pt advised to her that she can get lidocaine pt otc and insurance won't cover it. Pt verbalized understanding and has scheduled an appointment with ortho

## 2021-10-26 NOTE — Telephone Encounter (Signed)
The antibodic was prescribe did not help, went to ER and the ER said no signs of UTI said it was back pain, referred her to orthopedic doctor.  The pharmacy called and said medicine lidocaine (LIDODERM) 5 % that was given from the ER needs PA before she can afford this patch.  She is trying to get into Orthopedic  Dr Aline Brochure office.  The pharmacy  told her to contact her PCP to get the PA for this medicine.

## 2021-10-26 NOTE — Telephone Encounter (Signed)
[name] is out of network with patients insurance so patient will ask her insurance who they refer the clients to.

## 2021-10-26 NOTE — Telephone Encounter (Signed)
error 

## 2021-10-27 ENCOUNTER — Telehealth: Payer: Self-pay | Admitting: Nurse Practitioner

## 2021-10-27 LAB — URINE CULTURE

## 2021-10-27 NOTE — Progress Notes (Signed)
Please review results with patient, she did have some bacteria growing in her urine.  Patient should complete the dose of antibiotics ordered for her.  Drink plenty of fluids thank you

## 2021-10-27 NOTE — Telephone Encounter (Signed)
Please advise 

## 2021-10-27 NOTE — Telephone Encounter (Signed)
Spoke with pt advised to continue medication pt verbalized understanding

## 2021-10-27 NOTE — Telephone Encounter (Signed)
Pt called in regard to ER visit Saturday   Pt states that ER diagnosed ECOLI  Pt states that they also states she should stop current antibiotic.  Pt states severe pain in R side of back as well cant get rid of  * initial reason for ER visit*    Pt wants a call back in regards.

## 2021-11-03 NOTE — Telephone Encounter (Addendum)
PER INSURANCE PT SWITCHED TO [place] Upon patient request DME selection is [facility] Patient understands he will be contacted by [facility] to set up his cpap. Patient understands to call if [facility] does not contact him with new setup in a timely manner. Patient understands they will be called once confirmation has been received from [facility] that they have received their new machine to schedule 10 week follow up appointment.   [place] notified of new cpap order  Please add to airview Patient was grateful for the call and thanked me

## 2021-11-04 ENCOUNTER — Other Ambulatory Visit: Payer: Self-pay | Admitting: Cardiovascular Disease

## 2021-11-04 ENCOUNTER — Ambulatory Visit: Payer: 59

## 2021-11-04 NOTE — Telephone Encounter (Signed)
This is a [place] pt.  °

## 2021-11-05 ENCOUNTER — Ambulatory Visit: Payer: 59 | Admitting: Orthopedic Surgery

## 2021-11-11 ENCOUNTER — Ambulatory Visit: Payer: 59 | Admitting: Cardiology

## 2021-11-20 ENCOUNTER — Encounter (HOSPITAL_COMMUNITY): Payer: Self-pay | Admitting: Emergency Medicine

## 2021-11-20 ENCOUNTER — Emergency Department (HOSPITAL_COMMUNITY)
Admission: EM | Admit: 2021-11-20 | Discharge: 2021-11-20 | Disposition: A | Discharge status: Home / Self Care | Payer: 59 | Attending: Student | Admitting: Student

## 2021-11-20 ENCOUNTER — Other Ambulatory Visit: Payer: Self-pay | Admitting: Nurse Practitioner

## 2021-11-20 DIAGNOSIS — I10 Essential (primary) hypertension: Secondary | ICD-10-CM | POA: Diagnosis not present

## 2021-11-20 DIAGNOSIS — Z7984 Long term (current) use of oral hypoglycemic drugs: Secondary | ICD-10-CM | POA: Insufficient documentation

## 2021-11-20 DIAGNOSIS — Z7901 Long term (current) use of anticoagulants: Secondary | ICD-10-CM | POA: Insufficient documentation

## 2021-11-20 DIAGNOSIS — J40 Bronchitis, not specified as acute or chronic: Secondary | ICD-10-CM

## 2021-11-20 DIAGNOSIS — R002 Palpitations: Secondary | ICD-10-CM | POA: Diagnosis present

## 2021-11-20 DIAGNOSIS — Z79899 Other long term (current) drug therapy: Secondary | ICD-10-CM | POA: Insufficient documentation

## 2021-11-20 DIAGNOSIS — Z20822 Contact with and (suspected) exposure to covid-19: Secondary | ICD-10-CM | POA: Insufficient documentation

## 2021-11-20 DIAGNOSIS — E119 Type 2 diabetes mellitus without complications: Secondary | ICD-10-CM | POA: Diagnosis not present

## 2021-11-20 DIAGNOSIS — I4891 Unspecified atrial fibrillation: Secondary | ICD-10-CM

## 2021-11-20 LAB — COMPREHENSIVE METABOLIC PANEL
ALT: 19 U/L (ref 0–44)
AST: 19 U/L (ref 15–41)
Albumin: 3.8 g/dL (ref 3.5–5.0)
Alkaline Phosphatase: 50 U/L (ref 38–126)
Anion gap: 9 (ref 5–15)
BUN: 26 mg/dL — ABNORMAL HIGH (ref 6–20)
CO2: 24 mmol/L (ref 22–32)
Calcium: 10.3 mg/dL (ref 8.9–10.3)
Chloride: 107 mmol/L (ref 98–111)
Creatinine, Ser: 1.29 mg/dL — ABNORMAL HIGH (ref 0.44–1.00)
GFR, Estimated: 48 mL/min — ABNORMAL LOW (ref >60)
Glucose, Bld: 193 mg/dL — ABNORMAL HIGH (ref 70–99)
Potassium: 4.2 mmol/L (ref 3.5–5.1)
Sodium: 140 mmol/L (ref 135–145)
Total Bilirubin: 0.6 mg/dL (ref 0.3–1.2)
Total Protein: 7.4 g/dL (ref 6.5–8.1)

## 2021-11-20 LAB — CBC WITH DIFFERENTIAL/PLATELET
Abs Immature Granulocytes: 0.03 10*3/uL (ref 0.00–0.07)
Basophils Absolute: 0.1 10*3/uL (ref 0.0–0.1)
Basophils Relative: 1 %
Eosinophils Absolute: 0.2 10*3/uL (ref 0.0–0.5)
Eosinophils Relative: 2 %
HCT: 44.5 % (ref 36.0–46.0)
Hemoglobin: 14.4 g/dL (ref 12.0–15.0)
Immature Granulocytes: 0 %
Lymphocytes Relative: 37 %
Lymphs Abs: 3.3 10*3/uL (ref 0.7–4.0)
MCH: 29.4 pg (ref 26.0–34.0)
MCHC: 32.4 g/dL (ref 30.0–36.0)
MCV: 90.8 fL (ref 80.0–100.0)
Monocytes Absolute: 0.7 10*3/uL (ref 0.1–1.0)
Monocytes Relative: 8 %
Neutro Abs: 4.7 10*3/uL (ref 1.7–7.7)
Neutrophils Relative %: 52 %
Platelets: 267 10*3/uL (ref 150–400)
RBC: 4.9 MIL/uL (ref 3.87–5.11)
RDW: 14.2 % (ref 11.5–15.5)
WBC: 9 10*3/uL (ref 4.0–10.5)
nRBC: 0 % (ref 0.0–0.2)

## 2021-11-20 LAB — RESP PANEL BY RT-PCR (FLU A&B, COVID) ARPGX2
Influenza A by PCR: NEGATIVE
Influenza B by PCR: NEGATIVE
SARS Coronavirus 2 by RT PCR: NEGATIVE

## 2021-11-20 MED ORDER — PROPOFOL 10 MG/ML IV BOLUS
1.0000 mg/kg | Freq: Once | INTRAVENOUS | Status: AC
  Administered 2021-11-20: 70 mg via INTRAVENOUS
  Filled 2021-11-20: qty 20

## 2021-11-20 MED ORDER — PHENYLEPHRINE 40 MCG/ML (10ML) SYRINGE FOR IV PUSH (FOR BLOOD PRESSURE SUPPORT)
80.0000 ug | PREFILLED_SYRINGE | Freq: Once | INTRAVENOUS | Status: DC | PRN
  Filled 2021-11-20: qty 10

## 2021-11-20 MED ORDER — LACTATED RINGERS IV BOLUS
1000.0000 mL | Freq: Once | INTRAVENOUS | Status: AC
  Administered 2021-11-20: 1000 mL via INTRAVENOUS

## 2021-11-20 MED ORDER — DILTIAZEM HCL 25 MG/5ML IV SOLN
10.0000 mg | Freq: Once | INTRAVENOUS | Status: AC
  Administered 2021-11-20: 10 mg via INTRAVENOUS
  Filled 2021-11-20: qty 5

## 2021-11-20 NOTE — ED Notes (Signed)
Pt ambulated to bathroom with steady gait.  Completely awake, alert, oriented x4. Husband at bedside, will drive home. ?

## 2021-11-20 NOTE — ED Triage Notes (Signed)
Pt states she feels like she has been having palpitations since about midnight last night. Pt has hx of Afib and states she hasn't missed any of her medications.  ?

## 2021-11-20 NOTE — ED Provider Notes (Signed)
[facility] EMERGENCY DEPARTMENT Provider Note  CSN: [id number] Arrival date & time: 11/20/21 0600  Chief Complaint(s) Palpitations  HPI Kathryn Merritt is a 61 y.o. female with PMH paroxysmal A-fib on Eliquis, right bundle branch block, HTN, HLD who presents the emergency department for evaluation of palpitations and shortness of breath.  Patient states that she awoke at midnight this morning with a sensation of fluttering in her chest and palpitations.  She denies chest pain, abdominal pain, nausea, vomiting, diaphoresis or other systemic symptoms.  She did not take her metoprolol this morning, instead prompting to come to the emergency department due to her general malaise in the setting of her palpitations.  Patient arrives with an irregular tachycardia with rates in the 150s.   Palpitations  Past Medical History Past Medical History:  Diagnosis Date   Chronic back pain    Diabetes mellitus    Edema leg    Hyperlipidemia    Hypertension    Joint pain    Kidney stones    Other malaise and fatigue    PAF (paroxysmal atrial fibrillation) ([place])    a. new diagnosis in 02/2021 --> s/p DCCV on 03/18/2021   RBBB    Sinus bradycardia    Sleep apnea    Patient Active Problem List   Diagnosis Date Noted   Abdominal pain 10/23/2021   Urinary tract infection with hematuria 10/23/2021   Subclinical hypothyroidism 10/23/2021   Annual physical exam 09/23/2021   Bronchitis 09/09/2021   Gastroesophageal reflux disease 07/16/2021   OSA (obstructive sleep apnea) 07/14/2021   Establishing care with new doctor, encounter for 06/18/2021   HTN (hypertension) 06/18/2021   Hyperlipidemia 06/18/2021   Paroxysmal atrial fibrillation ([place]) 03/18/2021   Obesity, Class III, BMI 40-49.9 (morbid obesity) ([place]) 03/18/2021   Uncontrolled type 2 diabetes mellitus with hyperglycemia, without long-term current use of insulin ([place]) 03/18/2021   Home Medication(s) Prior to Admission medications    Medication Sig Start Date End Date Taking? Authorizing Provider  albuterol (VENTOLIN HFA) 108 (90 Base) MCG/ACT inhaler Inhale 2 puffs into the lungs every 6 (six) hours as needed for wheezing or shortness of breath. 09/09/21   Noreene Larsson, NP  amLODipine (NORVASC) 10 MG tablet Take 1 tablet (10 mg total) by mouth daily. 03/25/21 10/01/21  Josue Hector, MD  apixaban (ELIQUIS) 5 MG TABS tablet TAKE ONE TABLET BY MOUTH TWICE DAILY 09/28/21   Josue Hector, MD  blood glucose meter kit and supplies Dispense based on patient and insurance preference. Use up to four times daily as directed. (FOR ICD-10 E10.9, E11.9). 10/01/21   Brita Romp, NP  Cholecalciferol (VITAMIN D3) 25 MCG (1000 UT) CAPS Take 1 capsule (1,000 Units total) by mouth daily. 09/24/21   Paseda, Dewaine Conger, FNP  glimepiride (AMARYL) 2 MG tablet Take 1 tablet (2 mg total) by mouth 2 (two) times daily. 10/21/21   Paseda, Dewaine Conger, FNP  lidocaine (LIDODERM) 5 % Place 1 patch onto the skin daily. Remove & Discard patch within 12 hours or as directed by MD 10/24/21   Sherrill Raring, PA-C  lisinopril-hydrochlorothiazide (ZESTORETIC) 20-25 MG tablet Take 1 tablet by mouth daily 07/20/21   Noreene Larsson, NP  metoprolol succinate (TOPROL-XL) 50 MG 24 hr tablet TAKE ONE TABLET BY MOUTH DAILY. TAKE WITH OR IMMEDIATELY FOLLOWING A MEAL. 11/05/21   Minus Breeding, MD  omeprazole (PRILOSEC) 40 MG capsule Take 1 capsule (40 mg total) by mouth daily. 10/19/21   Paseda, Dewaine Conger,  FNP  rosuvastatin (CRESTOR) 20 MG tablet Take 1 tablet (20 mg total) by mouth daily. 09/24/21   Paseda, Dewaine Conger, FNP  sulfamethoxazole-trimethoprim (BACTRIM DS) 800-160 MG tablet Take 1 tablet by mouth 2 (two) times daily. 10/23/21   Paseda, Dewaine Conger, FNP  tirzepatide [facility]) 5 MG/0.5ML Pen Inject 5 mg into the skin once a week. 10/01/21   Brita Romp, NP  TRUE METRIX BLOOD GLUCOSE TEST test strip SMARTSIG:Via Meter 1-4 Times Daily 10/01/21   [provider]  TRUEplus Lancets 30G MISC USE UP TO FOUR TIMES DAILY AS DIRECTED 10/01/21   [provider]                                                                                                                                    Past Surgical History Past Surgical History:  Procedure Laterality Date   CARDIOVERSION N/A 03/18/2021   Procedure: CARDIOVERSION;  Surgeon: Satira Sark, MD;  Location: [facility];  Service: Cardiovascular;  Laterality: N/A;   CESAREAN SECTION     times 2   INCISION AND DRAINAGE ABSCESS Right    axilla   LITHOTRIPSY     Family History Family History  Problem Relation Age of Onset   Hypertension Mother    Cancer Father    Hypertension Father    Cancer Brother    Heart disease Paternal Grandmother    Breast cancer Neg Hx    Colon cancer Neg Hx     Social History Social History   Tobacco Use   Smoking status: Never   Smokeless tobacco: Never  Vaping Use   Vaping Use: Never used  Substance Use Topics   Alcohol use: No   Drug use: No   Allergies Patient has no known allergies.  Review of Systems Review of Systems  Cardiovascular:  Positive for palpitations.   Physical Exam Vital Signs  I have reviewed the triage vital signs BP (!) 143/98    Pulse (!) 126    Temp 98.2 F (36.8 C) (Oral)    Resp 20    Ht '5\' 3"'  (1.6 m)    Wt 113.4 kg    SpO2 100%    BMI 44.29 kg/m   Physical Exam Vitals and nursing note reviewed.  Constitutional:      General: She is not in acute distress.    Appearance: She is well-developed.  HENT:     Head: Normocephalic and atraumatic.  Eyes:     Conjunctiva/sclera: Conjunctivae normal.  Cardiovascular:     Rate and Rhythm: Tachycardia present. Rhythm irregular.     Heart sounds: No murmur heard. Pulmonary:     Effort: Pulmonary effort is normal. No respiratory distress.     Breath sounds: Normal breath sounds.  Abdominal:     Palpations: Abdomen is soft.     Tenderness: There is no  abdominal tenderness.  Musculoskeletal:  General: No swelling.     Cervical back: Neck supple.  Skin:    General: Skin is warm and dry.     Capillary Refill: Capillary refill takes less than 2 seconds.  Neurological:     Mental Status: She is alert.  Psychiatric:        Mood and Affect: Mood normal.    ED Results and Treatments Labs (all labs ordered are listed, but only abnormal results are displayed) Labs Reviewed  COMPREHENSIVE METABOLIC PANEL - Abnormal; Notable for the following components:      Result Value   Glucose, Bld 193 (*)    BUN 26 (*)    Creatinine, Ser 1.29 (*)    GFR, Estimated 48 (*)    All other components within normal limits  RESP PANEL BY RT-PCR (FLU A&B, COVID) ARPGX2  CBC WITH DIFFERENTIAL/PLATELET                                                                                                                          Radiology No results found.  Pertinent labs & imaging results that were available during my care of the patient were reviewed by me and considered in my medical decision making (see MDM for details).  Medications Ordered in ED Medications  propofol (DIPRIVAN) 10 mg/mL bolus/IV push 113.4 mg (has no administration in time range)  PHENYLephrine 40 mcg/ml in normal saline Adult IV Push Syringe (For Blood Pressure Support) (has no administration in time range)  lactated ringers bolus 1,000 mL (1,000 mLs Intravenous New Bag/Given 11/20/21 0631)  diltiazem (CARDIZEM) injection 10 mg (10 mg Intravenous Given 11/20/21 5885)                                                                                                                                     Procedures .Sedation  Date/Time: 11/20/2021 7:08 AM Performed by: Teressa Lower, MD Authorized by: Teressa Lower, MD   Consent:    Consent obtained:  Verbal   Consent given by:  Patient   Risks discussed:  Allergic reaction, dysrhythmia, inadequate sedation, nausea, prolonged hypoxia  resulting in organ damage, prolonged sedation necessitating reversal, respiratory compromise necessitating ventilatory assistance and intubation and vomiting   Alternatives discussed:  Analgesia without sedation, anxiolysis and regional anesthesia Universal protocol:    Procedure explained and questions answered to patient or proxy's satisfaction: yes     Relevant documents  present and verified: yes     Test results available: yes     Imaging studies available: yes     Required blood products, implants, devices, and special equipment available: yes     Site/side marked: yes     Immediately prior to procedure, a time out was called: yes     Patient identity confirmed:  Verbally with patient Indications:    Procedure necessitating sedation performed by:  Physician performing sedation Pre-sedation assessment:    Time since last food or drink:  1800   ASA classification: class 1 - normal, healthy patient     Mouth opening:  3 or more finger widths   Thyromental distance:  4 finger widths   Mallampati score:  I - soft palate, uvula, fauces, pillars visible   Neck mobility: normal     Pre-sedation assessments completed and reviewed: airway patency, cardiovascular function, hydration status, mental status, nausea/vomiting, pain level, respiratory function and temperature   Immediate pre-procedure details:    Reassessment: Patient reassessed immediately prior to procedure     Reviewed: vital signs, relevant labs/tests and NPO status     Verified: bag valve mask available, emergency equipment available, intubation equipment available, IV patency confirmed, oxygen available and suction available   Procedure details (see MAR for exact dosages):    Preoxygenation:  Nasal cannula   Sedation:  Propofol   Intended level of sedation: deep   Intra-procedure monitoring:  Blood pressure monitoring, cardiac monitor, continuous pulse oximetry, frequent LOC assessments, frequent vital sign checks and  continuous capnometry   Intra-procedure events: none     Total Provider sedation time (minutes):  15 Post-procedure details:    Attendance: Constant attendance by certified staff until patient recovered     Recovery: Patient returned to pre-procedure baseline     Post-sedation assessments completed and reviewed: airway patency, cardiovascular function, hydration status, mental status, nausea/vomiting, pain level, respiratory function and temperature     Patient is stable for discharge or admission: yes     Procedure completion:  Tolerated well, no immediate complications .Cardioversion  Date/Time: 11/20/2021 7:09 AM Performed by: Teressa Lower, MD Authorized by: Teressa Lower, MD   Consent:    Consent obtained:  Verbal   Consent given by:  Patient   Risks discussed:  Cutaneous burn, death, pain and induced arrhythmia   Alternatives discussed:  Rate-control medication Pre-procedure details:    Cardioversion basis:  Elective   Rhythm:  Atrial flutter   Electrode placement:  Anterior-posterior Patient sedated: Yes. Refer to sedation procedure documentation for details of sedation.  Attempt one:    Cardioversion mode:  Synchronous   Shock (Joules):  200   Shock outcome:  Conversion to normal sinus rhythm Post-procedure details:    Patient status:  Awake   Patient tolerance of procedure:  Tolerated well, no immediate complications .Critical Care Performed by: Teressa Lower, MD Authorized by: Teressa Lower, MD   Critical care provider statement:    Critical care time (minutes):  30   Critical care was necessary to treat or prevent imminent or life-threatening deterioration of the following conditions:  Cardiac failure   Critical care was time spent personally by me on the following activities:  Development of treatment plan with patient or surrogate, discussions with consultants, evaluation of patient's response to treatment, examination of patient, ordering and review of  laboratory studies, ordering and review of radiographic studies, ordering and performing treatments and interventions, pulse oximetry, re-evaluation of patient's condition and review of old charts  (  including critical care time)  Medical Decision Making / ED Course   This patient presents to the ED for concern of palpitations, this involves an extensive number of treatment options, and is a complaint that carries with it a high risk of complications and morbidity.  The differential diagnosis includes A-fib, dysrhythmia, electrolyte abnormality, dehydration  MDM: Patient seen emergency department for evaluation of symptomatic palpitations.  Physical exam reveals an irregular tachycardia but is otherwise unremarkable.  Laboratory evaluation largely unremarkable outside of a BUN of 26 and creatinine 1.29 which is baseline for this patient.  ECG with A-fib with RVR and right bundle branch block.  Cardiology was consulted who agrees that the patient is safe for cardioversion given that she has not missed any doses of her Eliquis.  Cardioversion and conscious sedation were performed leading to successful conversion to normal sinus rhythm.  Patient then discharged with outpatient cardiology follow-up.   Additional history obtained: -Additional history obtained from husband -External records from outside source obtained and reviewed including: Chart review including previous notes, labs, imaging, consultation notes   Lab Tests: -I ordered, reviewed, and interpreted labs.   The pertinent results include:   Labs Reviewed  COMPREHENSIVE METABOLIC PANEL - Abnormal; Notable for the following components:      Result Value   Glucose, Bld 193 (*)    BUN 26 (*)    Creatinine, Ser 1.29 (*)    GFR, Estimated 48 (*)    All other components within normal limits  RESP PANEL BY RT-PCR (FLU A&B, COVID) ARPGX2  CBC WITH DIFFERENTIAL/PLATELET      EKG   EKG Interpretation  Date/Time:  Friday November 20 2021  07:21:26 EST Ventricular Rate:  80 PR Interval:  145 QRS Duration: 139 QT Interval:  367 QTC Calculation: 424 R Axis:   200 Text Interpretation: Sinus rhythm Probable left atrial enlargement Right bundle branch block Confirmed by Addelynn Batte ([id number]) on 11/20/2021 7:26:20 AM          Medicines ordered and prescription drug management: Meds ordered this encounter  Medications   lactated ringers bolus 1,000 mL   diltiazem (CARDIZEM) injection 10 mg   propofol (DIPRIVAN) 10 mg/mL bolus/IV push 113.4 mg   PHENYLephrine 40 mcg/ml in normal saline Adult IV Push Syringe (For Blood Pressure Support)    -I have reviewed the patients home medicines and have made adjustments as needed  Critical interventions Procedural sedation, cardioversion, diltiazem  Consultations Obtained: I requested consultation with the cardiologist,  and discussed lab and imaging findings as well as pertinent plan - they recommend: Cardioversion   Cardiac Monitoring: The patient was maintained on a cardiac monitor.  I personally viewed and interpreted the cardiac monitored which showed an underlying rhythm of: A-fib with RVR, normal sinus rhythm  Social Determinants of Health:  Factors impacting patients care include: none   Reevaluation: After the interventions noted above, I reevaluated the patient and found that they have :improved  Co morbidities that complicate the patient evaluation  Past Medical History:  Diagnosis Date   Chronic back pain    Diabetes mellitus    Edema leg    Hyperlipidemia    Hypertension    Joint pain    Kidney stones    Other malaise and fatigue    PAF (paroxysmal atrial fibrillation) ([place])    a. new diagnosis in 02/2021 --> s/p DCCV on 03/18/2021   RBBB    Sinus bradycardia    Sleep apnea  Dispostion: I considered admission for this patient, but as she has returned to normal sinus rhythm after cardioversion she is safe for outpatient cardiology  follow-up     Final Clinical Impression(s) / ED Diagnoses Final diagnoses:  None     '@PCDICTATION' @    Jarica Plass, Debe Coder, MD 11/20/21 [phone]

## 2021-11-20 NOTE — ED Notes (Signed)
Time out performed

## 2021-12-07 NOTE — Progress Notes (Signed)
?  ?Cardiology Office Note ? ? ?Date:  12/09/2021  ? ?ID:  Kathryn Merritt, DOB March 27, 1961, MRN [id number] ? ?PCP:  Renee Rival, FNP  ?Cardiologist:   None ? ? ?No chief complaint on file. ? ? ? ?  ?History of Present Illness: ?Kathryn Merritt is a 61 y.o. female who presents for she was recently admitted 02/2021 with new onset atrial fibrillation starting around 0200 day of admission. They could not exclude atypical atrial flutter per notes. She was started on IV diltiazem but underwent DCCV 03/18/21 with successful conversion to NSR. She was started on Eliquis and Toprol. She was also noted to have mildly elevated troponin values felt c/w demand ischemia (also had hypertensive urgency during admission). 2D echo 03/18/21 EF 65-70%, moderate LVH, normal diastolic parameters, normal RV, mild LAE.  ? ?Since I last saw her she again had atrial fibrillation and was in the emergency room in March.  I reviewed these records for this visit.  She again had cardioversion.  She thinks in retrospect she probably had an episode in December that was self-limited.  This is the second cardioversion she has had in less than a year.  She otherwise feels okay.  She gets a little lightheaded.  She recently had her Wilder Glade stopped and is on a GLP-1 receptor agonist.  She had Janumet discontinued as well.  She says her blood sugars have come down.  She is lost 15 pounds.  She did have a positive sleep study and is yet to get her CPAP.  She has been starting to do a little exercise just recently and she thinks this is going okay.  She is having some mild dizziness but she is not having any presyncope or syncope.  She is not having any chest pressure, neck or arm discomfort.  She is not having any new shortness of breath, PND or orthopnea.  She had no weight gain or edema. ? ? ?Past Medical History:  ?Diagnosis Date  ? Chronic back pain   ? Diabetes mellitus   ? Edema leg   ? Hyperlipidemia   ? Hypertension   ? Joint pain   ? Kidney  stones   ? Other malaise and fatigue   ? PAF (paroxysmal atrial fibrillation) ([place])   ? a. new diagnosis in 02/2021 --> s/p DCCV on 03/18/2021  ? RBBB   ? Sinus bradycardia   ? Sleep apnea   ? ? ?Past Surgical History:  ?Procedure Laterality Date  ? CARDIOVERSION N/A 03/18/2021  ? Procedure: CARDIOVERSION;  Surgeon: Satira Sark, MD;  Location: [facility];  Service: Cardiovascular;  Laterality: N/A;  ? CESAREAN SECTION    ? times 2  ? INCISION AND DRAINAGE ABSCESS Right   ? axilla  ? LITHOTRIPSY    ? ? ? ?Current Outpatient Medications  ?Medication Sig Dispense Refill  ? albuterol (VENTOLIN HFA) 108 (90 Base) MCG/ACT inhaler Inhale 2 puffs into the lungs every 6 (six) hours as needed for wheezing or shortness of breath. 8.5 g 0  ? amLODipine (NORVASC) 10 MG tablet Take 1 tablet (10 mg total) by mouth daily. 90 tablet 3  ? apixaban (ELIQUIS) 5 MG TABS tablet TAKE ONE TABLET BY MOUTH TWICE DAILY 60 tablet 5  ? Cholecalciferol (VITAMIN D3) 25 MCG (1000 UT) CAPS Take 1 capsule (1,000 Units total) by mouth daily. 60 capsule 3  ? glimepiride (AMARYL) 2 MG tablet Take 1 tablet (2 mg total) by mouth 2 (two) times daily. [place]  tablet 3  ? lisinopril-hydrochlorothiazide (ZESTORETIC) 20-25 MG tablet Take 1 tablet by mouth daily 60 tablet 2  ? metoprolol succinate (TOPROL-XL) 50 MG 24 hr tablet TAKE ONE TABLET BY MOUTH DAILY. TAKE WITH OR IMMEDIATELY FOLLOWING A MEAL. 90 tablet 3  ? omeprazole (PRILOSEC) 40 MG capsule Take 1 capsule (40 mg total) by mouth daily. 30 capsule 3  ? rosuvastatin (CRESTOR) 20 MG tablet Take 1 tablet (20 mg total) by mouth daily. 90 tablet 3  ? blood glucose meter kit and supplies Dispense based on patient and insurance preference. Use up to four times daily as directed. (FOR ICD-10 E10.9, E11.9). 1 each 0  ? lidocaine (LIDODERM) 5 % Place 1 patch onto the skin daily. Remove & Discard patch within 12 hours or as directed by MD (Patient not taking: Reported on 12/09/2021) 30 patch 0  ?  sulfamethoxazole-trimethoprim (BACTRIM DS) 800-160 MG tablet Take 1 tablet by mouth 2 (two) times daily. (Patient not taking: Reported on 12/09/2021) 10 tablet 0  ? tirzepatide (MOUNJARO) 5 MG/0.5ML Pen Inject 5 mg into the skin once a week. 6 mL 3  ? TRUE METRIX BLOOD GLUCOSE TEST test strip SMARTSIG:Via Meter 1-4 Times Daily    ? TRUEplus Lancets 30G MISC USE UP TO FOUR TIMES DAILY AS DIRECTED    ? ?No current facility-administered medications for this visit.  ? ? ?Allergies:   Patient has no known allergies.  ? ? ?ROS:  Please see the history of present illness.   Otherwise, review of systems are positive for none.   All other systems are reviewed and negative.  ? ? ?PHYSICAL EXAM: ?VS:  BP 128/90   Pulse (!) 55   Ht _0  (1.6 m)   Wt 251 lb (113.9 kg)   BMI 44.46 kg/m?  , BMI Body mass index is 44.46 kg/m?. ?GENERAL:  Well appearing ?NECK:  No jugular venous distention, waveform within normal limits, carotid upstroke brisk and symmetric, no bruits, no thyromegaly ?LUNGS:  Clear to auscultation bilaterally ?CHEST:  Unremarkable ?HEART:  PMI not displaced or sustained,S1 and S2 within normal limits, no S3, no S4, no clicks, no rubs, no murmurs ?ABD:  Flat, positive bowel sounds normal in frequency in pitch, no bruits, no rebound, no guarding, no midline pulsatile mass, no hepatomegaly, no splenomegaly ?EXT:  2 plus pulses throughout, no edema, no cyanosis no clubbing ? ? ?EKG:  EKG  ordered today. ?The ekg ordered 06/11/2021 demonstrates sinus rhythm, rate 55, right bundle branch block, no acute ST-T wave changes. ? ? ?Recent Labs: ?10/23/2021: TSH 3.690 ?11/20/2021: ALT 19; BUN 26; Creatinine, Ser 1.29; Hemoglobin 14.4; Platelets 267; Potassium 4.2; Sodium 140  ? ? ?Lipid Panel ?   ?Component Value Date/Time  ? CHOL 137 09/23/2021 1223  ? TRIG 162 (H) 09/23/2021 1223  ? HDL 45 09/23/2021 1223  ? CHOLHDL 3.0 09/23/2021 1223  ? CHOLHDL 3.0 05/06/2021 0833  ? VLDL 18 05/06/2021 0833  ? Geraldine 64 09/23/2021 1223  ? ?   ? ?Wt Readings from Last 3 Encounters:  ?12/09/21 251 lb (113.9 kg)  ?11/20/21 250 lb (113.4 kg)  ?10/24/21 257 lb 15 oz (117 kg)  ?  ? ? ?Other studies Reviewed: ?Additional studies/ records that were reviewed today include: ED records ?Review of the above records demonstrates:  Please see elsewhere in the note.   ? ? ?ASSESSMENT AND PLAN: ? ?Paroxysmal atrial fibrillation:    We had a long discussion about ablation.  She wants to think  about this and I think that is reasonable as she continues with weight loss and is we manage her sleep apnea.  Continue Eliquis for CHADSVASC 3 (HTN, DM, female).  ?  ?Essential HTN:   Blood pressure is controlled.  No change in therapy.  ? ?DM:   She is now on the meds as listed above and is being managed by endocrinology.  She has changed her diet and we talked about this again today.  ? ?Morbid obesity:   I am happy for her weight loss  I am proud that she has changed her diet and she is doing exercise.  Hopefully she has a gradual continued weight loss.  ? ?Dizziness: This may be related to her blood sugar control but at this point I am not going to pursue further cardiac testing.  ? ?Sleep apnea: I sent a message to see if we can expedite her CPAP management as this would facilitate a more successful attempted ablation when and if that happens. ? ? ?Current medicines are reviewed at length with the patient today.  The patient does not have concerns regarding medicines. ? ?The following changes have been made: None ? ?Labs/ tests ordered today include: None ? ?No orders of the defined types were placed in this encounter. ? ? ? ? ?Disposition:   FU with me 6 months ? ? ?Signed, ?Minus Breeding, MD  ?12/09/2021 4:14 PM    ?[place] ? ? ? ?

## 2021-12-09 ENCOUNTER — Encounter: Payer: Self-pay | Admitting: Cardiology

## 2021-12-09 ENCOUNTER — Ambulatory Visit (INDEPENDENT_AMBULATORY_CARE_PROVIDER_SITE_OTHER): Payer: 59 | Admitting: Cardiology

## 2021-12-09 VITALS — BP 128/90 | HR 55 | Ht 63.0 in | Wt 251.0 lb

## 2021-12-09 DIAGNOSIS — I480 Paroxysmal atrial fibrillation: Secondary | ICD-10-CM

## 2021-12-09 DIAGNOSIS — I10 Essential (primary) hypertension: Secondary | ICD-10-CM

## 2021-12-09 NOTE — Patient Instructions (Signed)
Medication Instructions:  The current medical regimen is effective;  continue present plan and medications.  *If you need a refill on your cardiac medications before your next appointment, please call your pharmacy*  Follow-Up: At [facility], you and your health needs are our priority.  As part of our continuing mission to provide you with exceptional heart care, we have created designated Provider Care Teams.  These Care Teams include your primary Cardiologist (physician) and Advanced Practice Providers (APPs -  Physician Assistants and Nurse Practitioners) who all work together to provide you with the care you need, when you need it.  We recommend signing up for the patient portal called "MyChart".  Sign up information is provided on this After Visit Summary.  MyChart is used to connect with patients for Virtual Visits (Telemedicine).  Patients are able to view lab/test results, encounter notes, upcoming appointments, etc.  Non-urgent messages can be sent to your provider as well.   To learn more about what you can do with MyChart, go to [email or web address].    Your next appointment:   6 month(s)  The format for your next appointment:   In Person  Provider:   James Hochrein, MD   Thank you for choosing [facility]!!     

## 2021-12-14 ENCOUNTER — Telehealth: Payer: Self-pay | Admitting: Nurse Practitioner

## 2021-12-14 NOTE — Telephone Encounter (Signed)
Called the patient and was forwarded to voice mail. I left a detailed voice message for the patient to call back. ?

## 2021-12-14 NOTE — Telephone Encounter (Signed)
Left a voice message on the number provided from patient with a call back number. ?

## 2021-12-14 NOTE — Telephone Encounter (Signed)
I called the patient and she stated that she has 2 sores on the top of her right foot near her Ring toe that are really red and very tender to the touch. Patient was not able to get in with her primary care doctor and wanted to know if you are able to see her or call her in an antibiotic. Patient is keeping the wounds clean and applying triple antibiotic ointment. I advised for patient to call her PCP in the morning or go to the [facility]. Patient denies any streaking, oozing, or severe pain. ?Please advise. ?

## 2021-12-14 NOTE — Telephone Encounter (Signed)
Pt called and states it is best to reach her at work today and ask for her. [phone] ?

## 2021-12-14 NOTE — Telephone Encounter (Signed)
Patient is requesting a call back.

## 2021-12-15 ENCOUNTER — Other Ambulatory Visit: Payer: Self-pay

## 2021-12-15 ENCOUNTER — Ambulatory Visit
Admission: EM | Admit: 2021-12-15 | Discharge: 2021-12-15 | Disposition: A | Discharge status: Home / Self Care | Payer: 59 | Attending: Urgent Care | Admitting: Urgent Care

## 2021-12-15 DIAGNOSIS — E119 Type 2 diabetes mellitus without complications: Secondary | ICD-10-CM

## 2021-12-15 DIAGNOSIS — S90811A Abrasion, right foot, initial encounter: Secondary | ICD-10-CM | POA: Diagnosis not present

## 2021-12-15 DIAGNOSIS — E11628 Type 2 diabetes mellitus with other skin complications: Secondary | ICD-10-CM

## 2021-12-15 DIAGNOSIS — L089 Local infection of the skin and subcutaneous tissue, unspecified: Secondary | ICD-10-CM | POA: Diagnosis not present

## 2021-12-15 DIAGNOSIS — E1122 Type 2 diabetes mellitus with diabetic chronic kidney disease: Secondary | ICD-10-CM

## 2021-12-15 DIAGNOSIS — M79671 Pain in right foot: Secondary | ICD-10-CM

## 2021-12-15 DIAGNOSIS — N183 Chronic kidney disease, stage 3 unspecified: Secondary | ICD-10-CM

## 2021-12-15 MED ORDER — DOXYCYCLINE HYCLATE 100 MG PO CAPS: 100.0000 mg | ORAL_CAPSULE | Freq: Two times a day (BID) | ORAL | 0 refills | Status: DC

## 2021-12-15 NOTE — Telephone Encounter (Signed)
I agree with your advice.  She needs to follow up with her PCP regarding that.

## 2021-12-15 NOTE — ED Triage Notes (Signed)
Pt states she may have an infection in the right foot. States she started having itching and scratched too hard. Reports right foot feels tender.  ?

## 2021-12-15 NOTE — ED Provider Notes (Signed)
?[place] ? ? ?MRN: [id number] DOB: 03/24/1961 ? ?Subjective:  ? ?Kathryn Merritt is a 61 y.o. female presenting for 1 week history of progressively worsening right foot pain with swelling.  Patient states that symptoms initially started with a severe itch that she scratched excessively.  And then she developed a scab which has continued and is getting red and painful.  Has a history of CKD.  She is also diabetic, treated without insulin.  No fevers, nausea, vomiting, abdominal pain. ? ?No current facility-administered medications for this encounter. ? ?Current Outpatient Medications:  ?  albuterol (VENTOLIN HFA) 108 (90 Base) MCG/ACT inhaler, Inhale 2 puffs into the lungs every 6 (six) hours as needed for wheezing or shortness of breath., Disp: 8.5 g, Rfl: 0 ?  amLODipine (NORVASC) 10 MG tablet, Take 1 tablet (10 mg total) by mouth daily., Disp: 90 tablet, Rfl: 3 ?  apixaban (ELIQUIS) 5 MG TABS tablet, TAKE ONE TABLET BY MOUTH TWICE DAILY, Disp: 60 tablet, Rfl: 5 ?  blood glucose meter kit and supplies, Dispense based on patient and insurance preference. Use up to four times daily as directed. (FOR ICD-10 E10.9, E11.9)., Disp: 1 each, Rfl: 0 ?  Cholecalciferol (VITAMIN D3) 25 MCG (1000 UT) CAPS, Take 1 capsule (1,000 Units total) by mouth daily., Disp: 60 capsule, Rfl: 3 ?  glimepiride (AMARYL) 2 MG tablet, Take 1 tablet (2 mg total) by mouth 2 (two) times daily., Disp: 60 tablet, Rfl: 3 ?  lidocaine (LIDODERM) 5 %, Place 1 patch onto the skin daily. Remove & Discard patch within 12 hours or as directed by MD (Patient not taking: Reported on 12/09/2021), Disp: 30 patch, Rfl: 0 ?  lisinopril-hydrochlorothiazide (ZESTORETIC) 20-25 MG tablet, Take 1 tablet by mouth daily, Disp: 60 tablet, Rfl: 2 ?  metoprolol succinate (TOPROL-XL) 50 MG 24 hr tablet, TAKE ONE TABLET BY MOUTH DAILY. TAKE WITH OR IMMEDIATELY FOLLOWING A MEAL., Disp: 90 tablet, Rfl: 3 ?  omeprazole (PRILOSEC) 40 MG capsule, Take 1  capsule (40 mg total) by mouth daily., Disp: 30 capsule, Rfl: 3 ?  rosuvastatin (CRESTOR) 20 MG tablet, Take 1 tablet (20 mg total) by mouth daily., Disp: 90 tablet, Rfl: 3 ?  sulfamethoxazole-trimethoprim (BACTRIM DS) 800-160 MG tablet, Take 1 tablet by mouth 2 (two) times daily. (Patient not taking: Reported on 12/09/2021), Disp: 10 tablet, Rfl: 0 ?  tirzepatide (MOUNJARO) 5 MG/0.5ML Pen, Inject 5 mg into the skin once a week., Disp: 6 mL, Rfl: 3 ?  TRUE METRIX BLOOD GLUCOSE TEST test strip, SMARTSIG:Via Meter 1-4 Times Daily, Disp: , Rfl:  ?  TRUEplus Lancets 30G MISC, USE UP TO FOUR TIMES DAILY AS DIRECTED, Disp: , Rfl:   ? ?No Known Allergies ? ?Past Medical History:  ?Diagnosis Date  ? Chronic back pain   ? Diabetes mellitus   ? Edema leg   ? Hyperlipidemia   ? Hypertension   ? Joint pain   ? Kidney stones   ? Other malaise and fatigue   ? PAF (paroxysmal atrial fibrillation) ([place])   ? a. new diagnosis in 02/2021 --> s/p DCCV on 03/18/2021  ? RBBB   ? Sinus bradycardia   ? Sleep apnea   ?  ? ?Past Surgical History:  ?Procedure Laterality Date  ? CARDIOVERSION N/A 03/18/2021  ? Procedure: CARDIOVERSION;  Surgeon: Satira Sark, MD;  Location: [facility];  Service: Cardiovascular;  Laterality: N/A;  ? CESAREAN SECTION    ? times 2  ? INCISION AND  DRAINAGE ABSCESS Right   ? axilla  ? LITHOTRIPSY    ? ? ?Family History  ?Problem Relation Age of Onset  ? Hypertension Mother   ? Cancer Father   ? Hypertension Father   ? Cancer Brother   ? Heart disease Paternal Grandmother   ? Breast cancer Neg Hx   ? Colon cancer Neg Hx   ? ? ?Social History  ? ?Tobacco Use  ? Smoking status: Never  ? Smokeless tobacco: Never  ?Vaping Use  ? Vaping Use: Never used  ?Substance Use Topics  ? Alcohol use: No  ? Drug use: No  ? ? ?ROS ? ? ?Objective:  ? ?Vitals: ?BP 120/62 (BP Location: Right Arm)   Pulse 62   Temp 98.2 ?F (36.8 ?C) (Oral)   Resp 18   SpO2 95%  ? ?Physical Exam ?Constitutional:   ?   General: She is not in acute  distress. ?   Appearance: Normal appearance. She is well-developed. She is not ill-appearing, toxic-appearing or diaphoretic.  ?HENT:  ?   Head: Normocephalic and atraumatic.  ?   Nose: Nose normal.  ?   Mouth/Throat:  ?   Mouth: Mucous membranes are moist.  ?Eyes:  ?   General: No scleral icterus.    ?   Right eye: No discharge.     ?   Left eye: No discharge.  ?   Extraocular Movements: Extraocular movements intact.  ?Cardiovascular:  ?   Rate and Rhythm: Normal rate.  ?Pulmonary:  ?   Effort: Pulmonary effort is normal.  ?Skin: ?   General: Skin is warm and dry.  ?   Findings: Rash (abrasion with surrounding erythema and tenderness, central eschar over the dorsal lateral aspect of the distal right foot) present.  ?Neurological:  ?   General: No focal deficit present.  ?   Mental Status: She is alert and oriented to person, place, and time.  ?Psychiatric:     ?   Mood and Affect: Mood normal.     ?   Behavior: Behavior normal.  ? ? ? ? ?Bacitracin applied to the wound, secured with nonadherent dressing and Coban. ? ?Assessment and Plan :  ? ?PDMP not reviewed this encounter. ? ?1. Infected abrasion of right foot, initial encounter   ?2. Right foot pain   ?3. Stage 3 chronic kidney disease, unspecified whether stage 3a or 3b CKD ([place])   ?4. Type 2 diabetes mellitus treated without insulin ([place])   ? ?Start doxycycline to address an infected abrasion.  Wound care reviewed.  Use supportive care otherwise. Counseled patient on potential for adverse effects with medications prescribed/recommended today, ER and return-to-clinic precautions discussed, patient verbalized understanding. ? ?  ?Jaynee Eagles, PA-C ?12/15/21 1822 ? ?

## 2021-12-15 NOTE — Telephone Encounter (Signed)
Called the patient and gave her the message. Patient verbalized an understanding and will reach out to her PCP or go to the [facility] in [place]. ?

## 2021-12-23 NOTE — Telephone Encounter (Signed)
Patient called to ask about her cpap set up again today. I reached out to  [place] and spoke to [place] who states they were waiting for more clinicals from our office. I asked if they were still pulling the information out of Epic Terri said she would pull it and call the patient by Friday.  ?

## 2021-12-24 ENCOUNTER — Other Ambulatory Visit: Payer: Self-pay

## 2021-12-24 MED ORDER — LISINOPRIL-HYDROCHLOROTHIAZIDE 20-25 MG PO TABS: 1.0000 | ORAL_TABLET | Freq: Every day | ORAL | 2 refills | Status: DC

## 2021-12-26 ENCOUNTER — Encounter: Payer: Self-pay | Admitting: Emergency Medicine

## 2021-12-26 ENCOUNTER — Ambulatory Visit
Admission: EM | Admit: 2021-12-26 | Discharge: 2021-12-26 | Disposition: A | Discharge status: Home / Self Care | Payer: 59 | Attending: Family Medicine | Admitting: Family Medicine

## 2021-12-26 ENCOUNTER — Ambulatory Visit (INDEPENDENT_AMBULATORY_CARE_PROVIDER_SITE_OTHER): Payer: 59

## 2021-12-26 ENCOUNTER — Other Ambulatory Visit: Payer: Self-pay

## 2021-12-26 DIAGNOSIS — M25512 Pain in left shoulder: Secondary | ICD-10-CM

## 2021-12-26 MED ORDER — PREDNISONE 20 MG PO TABS: 40.0000 mg | ORAL_TABLET | Freq: Every day | ORAL | 0 refills | Status: DC

## 2021-12-26 MED ORDER — DICLOFENAC SODIUM 1 % EX GEL: 2.0000 g | Freq: Four times a day (QID) | CUTANEOUS | 2 refills | Status: DC

## 2021-12-26 NOTE — ED Triage Notes (Addendum)
Pt reports left shoulder pain, intermittent weakness x2-3 weeks. Pt denies any known injury. Pt reports "woke me out of my sleep last night." Pt reports "cold" seems to increase pain and has been taking tylenol, with no change in pain.  ?

## 2021-12-26 NOTE — ED Provider Notes (Signed)
?[place] ? ? ? ?CSN: [id number] ?Arrival date & time: 12/26/21  9381 ? ? ?  ? ?History   ?Chief Complaint ?Chief Complaint  ?Patient presents with  ? Shoulder Pain  ? ?HPI ?Kathryn Merritt is a 60 y.o. female.  ? ?Presenting today with 2 to 3-week history of dull aching left shoulder pain that is worse at night when she is trying to sleep.  She states it has worsened over the past few days to where she is crying in her sleep and unable to rest.  She denies known injury to the area, swelling, redness, deformities, numbness, tingling, radiation of pain down the arm.  No known history of chronic shoulder issues previously.  Has been taking Tylenol with no improvement in pain.  She states she was recently diagnosed with atrial fibrillation and RVR and placed on a blood thinner so she is no longer able to take Aleve as she typically would and that her joints have been hurting more than usual. ? ?Past Medical History:  ?Diagnosis Date  ? Chronic back pain   ? Diabetes mellitus   ? Edema leg   ? Hyperlipidemia   ? Hypertension   ? Joint pain   ? Kidney stones   ? Other malaise and fatigue   ? PAF (paroxysmal atrial fibrillation) ([place])   ? a. new diagnosis in 02/2021 --> s/p DCCV on 03/18/2021  ? RBBB   ? Sinus bradycardia   ? Sleep apnea   ? ?Patient Active Problem List  ? Diagnosis Date Noted  ? Abdominal pain 10/23/2021  ? Urinary tract infection with hematuria 10/23/2021  ? Subclinical hypothyroidism 10/23/2021  ? Annual physical exam 09/23/2021  ? Bronchitis 09/09/2021  ? Gastroesophageal reflux disease 07/16/2021  ? OSA (obstructive sleep apnea) 07/14/2021  ? Establishing care with new doctor, encounter for 06/18/2021  ? HTN (hypertension) 06/18/2021  ? Hyperlipidemia 06/18/2021  ? Paroxysmal atrial fibrillation ([place]) 03/18/2021  ? Obesity, Class III, BMI 40-49.9 (morbid obesity) ([place]) 03/18/2021  ? Uncontrolled type 2 diabetes mellitus with hyperglycemia, without long-term current use of insulin ([place])  03/18/2021  ? ?Past Surgical History:  ?Procedure Laterality Date  ? CARDIOVERSION N/A 03/18/2021  ? Procedure: CARDIOVERSION;  Surgeon: Satira Sark, MD;  Location: [facility];  Service: Cardiovascular;  Laterality: N/A;  ? CESAREAN SECTION    ? times 2  ? INCISION AND DRAINAGE ABSCESS Right   ? axilla  ? LITHOTRIPSY    ? ?OB History   ?No obstetric history on file. ?  ? ?Home Medications   ? ?Prior to Admission medications   ?Medication Sig Start Date End Date Taking? Authorizing Provider  ?diclofenac Sodium (VOLTAREN) 1 % GEL Apply 2 g topically 4 (four) times daily. 12/26/21  Yes Volney American, PA-C  ?predniSONE (DELTASONE) 20 MG tablet Take 2 tablets (40 mg total) by mouth daily with breakfast. 12/26/21  Yes Volney American, PA-C  ?albuterol (VENTOLIN HFA) 108 (90 Base) MCG/ACT inhaler Inhale 2 puffs into the lungs every 6 (six) hours as needed for wheezing or shortness of breath. 11/20/21   Renee Rival, FNP  ?amLODipine (NORVASC) 10 MG tablet Take 1 tablet (10 mg total) by mouth daily. 03/25/21 12/09/21  Josue Hector, MD  ?apixaban (ELIQUIS) 5 MG TABS tablet TAKE ONE TABLET BY MOUTH TWICE DAILY 09/28/21   Josue Hector, MD  ?blood glucose meter kit and supplies Dispense based on patient and insurance preference. Use up to four times daily  as directed. (FOR ICD-10 E10.9, E11.9). 10/01/21   Brita Romp, NP  ?Cholecalciferol (VITAMIN D3) 25 MCG (1000 UT) CAPS Take 1 capsule (1,000 Units total) by mouth daily. 09/24/21   Renee Rival, FNP  ?doxycycline (VIBRAMYCIN) 100 MG capsule Take 1 capsule (100 mg total) by mouth 2 (two) times daily. 12/15/21   Jaynee Eagles, PA-C  ?glimepiride (AMARYL) 2 MG tablet Take 1 tablet (2 mg total) by mouth 2 (two) times daily. 10/21/21   Renee Rival, FNP  ?lidocaine (LIDODERM) 5 % Place 1 patch onto the skin daily. Remove & Discard patch within 12 hours or as directed by MD ?Patient not taking: Reported on 12/09/2021 10/24/21   Sherrill Raring, PA-C   ?lisinopril-hydrochlorothiazide (ZESTORETIC) 20-25 MG tablet Take 1 tablet by mouth daily. 12/24/21   Renee Rival, FNP  ?metoprolol succinate (TOPROL-XL) 50 MG 24 hr tablet TAKE ONE TABLET BY MOUTH DAILY. TAKE WITH OR IMMEDIATELY FOLLOWING A MEAL. 11/05/21   Minus Breeding, MD  ?omeprazole (PRILOSEC) 40 MG capsule Take 1 capsule (40 mg total) by mouth daily. 10/19/21   Renee Rival, FNP  ?rosuvastatin (CRESTOR) 20 MG tablet Take 1 tablet (20 mg total) by mouth daily. 09/24/21   Renee Rival, FNP  ?sulfamethoxazole-trimethoprim (BACTRIM DS) 800-160 MG tablet Take 1 tablet by mouth 2 (two) times daily. ?Patient not taking: Reported on 12/09/2021 10/23/21   Renee Rival, FNP  ?tirzepatide [facility]) 5 MG/0.5ML Pen Inject 5 mg into the skin once a week. 10/01/21   Brita Romp, NP  ?TRUE METRIX BLOOD GLUCOSE TEST test strip SMARTSIG:Via Meter 1-4 Times Daily 10/01/21   [provider]  ?TRUEplus Lancets 30G MISC USE UP TO FOUR TIMES DAILY AS DIRECTED 10/01/21   [provider]  ? ? ?Family History ?Family History  ?Problem Relation Age of Onset  ? Hypertension Mother   ? Cancer Father   ? Hypertension Father   ? Cancer Brother   ? Heart disease Paternal Grandmother   ? Breast cancer Neg Hx   ? Colon cancer Neg Hx   ? ? ?Social History ?Social History  ? ?Tobacco Use  ? Smoking status: Never  ? Smokeless tobacco: Never  ?Vaping Use  ? Vaping Use: Never used  ?Substance Use Topics  ? Alcohol use: No  ? Drug use: No  ? ? ? ?Allergies   ?Patient has no known allergies. ? ? ?Review of Systems ?Review of Systems ?Per HPI ? ?Physical Exam ?Triage Vital Signs ?ED Triage Vitals [12/26/21 0951]  ?Enc Vitals Group  ?   BP (!) 148/76  ?   Pulse Rate (!) 53  ?   Resp 18  ?   Temp 98 ?F (36.7 ?C)  ?   Temp Source Oral  ?   SpO2 98 %  ?   Weight 249 lb 1.9 oz (113 kg)  ?   Height _0  (1.6 m)  ?   Head Circumference   ?   Peak Flow   ?   Pain Score 0  ?   Pain Loc   ?   Pain Edu?   ?   Excl.  in [place]?   ? ?No data found. ? ?Updated Vital Signs ?BP (!) 148/76 (BP Location: Right Arm)   Pulse (!) 53   Temp 98 ?F (36.7 ?C) (Oral)   Resp 18   Ht _1  (1.6 m)   Wt 249 lb 1.9 oz (113 kg)   SpO2 98%  BMI 44.13 kg/m?  ? ?Visual Acuity ?Right Eye Distance:   ?Left Eye Distance:   ?Bilateral Distance:   ? ?Right Eye Near:   ?Left Eye Near:    ?Bilateral Near:    ? ?Physical Exam ?Vitals and nursing note reviewed.  ?Constitutional:   ?   Appearance: Normal appearance. She is not ill-appearing.  ?HENT:  ?   Head: Atraumatic.  ?Eyes:  ?   Extraocular Movements: Extraocular movements intact.  ?   Conjunctiva/sclera: Conjunctivae normal.  ?Cardiovascular:  ?   Rate and Rhythm: Normal rate and regular rhythm.  ?   Heart sounds: Normal heart sounds.  ?Pulmonary:  ?   Effort: Pulmonary effort is normal.  ?   Breath sounds: Normal breath sounds.  ?Musculoskeletal:     ?   General: No swelling, tenderness, deformity or signs of injury. Normal range of motion.  ?   Cervical back: Normal range of motion and neck supple.  ?   Comments: Range of motion intact but painful beyond 90 degrees  ?Skin: ?   General: Skin is warm and dry.  ?   Findings: No bruising or erythema.  ?Neurological:  ?   Mental Status: She is alert and oriented to person, place, and time.  ?   Sensory: No sensory deficit.  ?   Comments: Left upper extremity neurovascularly intact  ?Psychiatric:     ?   Mood and Affect: Mood normal.     ?   Thought Content: Thought content normal.     ?   Judgment: Judgment normal.  ? ? ? ?UC Treatments / Results  ?Labs ?(all labs ordered are listed, but only abnormal results are displayed) ?Labs Reviewed - No data to display ? ?EKG ? ? ?Radiology ?DG Shoulder Left ? ?Result Date: 12/26/2021 ?CLINICAL DATA:  2-3 weeks of LEFT shoulder pain and stiffness. No known injury. EXAM: LEFT SHOULDER - 2+ VIEW COMPARISON:  None. FINDINGS: Osseous alignment is normal. No fracture line or displaced fracture fragment is seen. No  acute-appearing cortical irregularity or osseous lesion. No significant degenerative change at the glenohumeral or acromioclavicular joint spaces. Soft tissues about the LEFT shoulder are unremarkable. IMPRESSION: Constance Holster

## 2021-12-28 NOTE — Telephone Encounter (Signed)
Patient called again today about her cpap because she has not heard anything from [place]. Reached out to [place] spoke to [place] again who states she got busy and was not able to start on the patients order last week but she would start on it as we were speaking and call me back by the end of the day with her progress. ?

## 2021-12-29 ENCOUNTER — Telehealth: Payer: Self-pay

## 2021-12-29 ENCOUNTER — Encounter: Payer: Self-pay | Admitting: Family Medicine

## 2021-12-29 ENCOUNTER — Ambulatory Visit (INDEPENDENT_AMBULATORY_CARE_PROVIDER_SITE_OTHER): Payer: 59 | Admitting: Family Medicine

## 2021-12-29 DIAGNOSIS — K5900 Constipation, unspecified: Secondary | ICD-10-CM | POA: Diagnosis not present

## 2021-12-29 MED ORDER — PSYLLIUM FIBER 0.52 G PO CAPS: 3.0000 | ORAL_CAPSULE | Freq: Every day | ORAL | 0 refills | Status: DC

## 2021-12-29 MED ORDER — POLYETHYLENE GLYCOL 3350 17 GM/SCOOP PO POWD: 17.0000 g | Freq: Every day | ORAL | 1 refills | Status: DC

## 2021-12-29 NOTE — Progress Notes (Signed)
?  ? ?Virtual Visit via Telephone Note  ? ?This visit type was conducted due to national recommendations for restrictions regarding the COVID-19 Pandemic (e.g. social distancing) in an effort to limit this patient's exposure and mitigate transmission in our community.  Due to her co-morbid illnesses, this patient is at least at moderate risk for complications without adequate follow up.  This format is felt to be most appropriate for this patient at this time.  The patient did not have access to video technology/had technical difficulties with video requiring transitioning to audio format only (telephone).  All issues noted in this document were discussed and addressed.  No physical exam could be performed with this format.  Please refer to the patient's chart for her  consent to telehealth for [facility].  ? ?Evaluation Performed:  Acute Visit ? ?Date:  12/29/2021  ? ?ID:  Kathryn Merritt, DOB 1961-03-07, MRN [id number] ? ?Patient Location: Home ?Provider Location: Office/Clinic ? ?Participants: Patient ?Location of Patient: Home ?Location of Provider: Telehealth ?Consent was obtain for visit to be over via telehealth. ?I verified that I am speaking with the correct person using two identifiers. ? ?PCP:  Renee Rival, FNP  ? ?Chief Complaint: chronic constipation ? ?History of Present Illness:   ? ?Kathryn Merritt is a 61 y.o. female with complaints of chronic constipation for a month. She states that she has had constipation but believes her symptoms are worsening because she cannot have a regular BM without taking a laxative. She feels the urge to defecate but cannot. The patient has been taking Dulcolax with minimum relief. She admits to a low-fiber diet but states that she drinks lots of fluids. She says her stool consistency appears normal, without blood, pus, and mucus. The patient denies fever, chills, and vomiting but reports mild nausea.  ? ?Kathryn Merritt has a  GI appt on 4/24 for her  colonoscopy. ? ?The patient does not have symptoms concerning for COVID-19 infection (fever, chills, cough, or new shortness of breath).  ? ?Past Medical, Surgical, Social History, Allergies, and Medications have been Reviewed. ? ?Past Medical History:  ?Diagnosis Date  ? Chronic back pain   ? Diabetes mellitus   ? Edema leg   ? Hyperlipidemia   ? Hypertension   ? Joint pain   ? Kidney stones   ? Other malaise and fatigue   ? PAF (paroxysmal atrial fibrillation) ([place])   ? a. new diagnosis in 02/2021 --> s/p DCCV on 03/18/2021  ? RBBB   ? Sinus bradycardia   ? Sleep apnea   ? ?Past Surgical History:  ?Procedure Laterality Date  ? CARDIOVERSION N/A 03/18/2021  ? Procedure: CARDIOVERSION;  Surgeon: Satira Sark, MD;  Location: [facility];  Service: Cardiovascular;  Laterality: N/A;  ? CESAREAN SECTION    ? times 2  ? INCISION AND DRAINAGE ABSCESS Right   ? axilla  ? LITHOTRIPSY    ?  ? ?Current Meds  ?Medication Sig  ? albuterol (VENTOLIN HFA) 108 (90 Base) MCG/ACT inhaler Inhale 2 puffs into the lungs every 6 (six) hours as needed for wheezing or shortness of breath.  ? apixaban (ELIQUIS) 5 MG TABS tablet TAKE ONE TABLET BY MOUTH TWICE DAILY  ? blood glucose meter kit and supplies Dispense based on patient and insurance preference. Use up to four times daily as directed. (FOR ICD-10 E10.9, E11.9).  ? Cholecalciferol (VITAMIN D3) 25 MCG (1000 UT) CAPS Take 1 capsule (1,000 Units total) by mouth daily.  ?  diclofenac Sodium (VOLTAREN) 1 % GEL Apply 2 g topically 4 (four) times daily.  ? glimepiride (AMARYL) 2 MG tablet Take 1 tablet (2 mg total) by mouth 2 (two) times daily.  ? lidocaine (LIDODERM) 5 % Place 1 patch onto the skin daily. Remove & Discard patch within 12 hours or as directed by MD  ? lisinopril-hydrochlorothiazide (ZESTORETIC) 20-25 MG tablet Take 1 tablet by mouth daily.  ? metoprolol succinate (TOPROL-XL) 50 MG 24 hr tablet TAKE ONE TABLET BY MOUTH DAILY. TAKE WITH OR IMMEDIATELY FOLLOWING A MEAL.  ?  omeprazole (PRILOSEC) 40 MG capsule Take 1 capsule (40 mg total) by mouth daily.  ? polyethylene glycol powder (GLYCOLAX/MIRALAX) 17 GM/SCOOP powder Take 17 g by mouth daily.  ? predniSONE (DELTASONE) 20 MG tablet Take 2 tablets (40 mg total) by mouth daily with breakfast.  ? Psyllium Fiber 0.52 g CAPS Take 3 capsules by mouth daily at 2 PM.  ? rosuvastatin (CRESTOR) 20 MG tablet Take 1 tablet (20 mg total) by mouth daily.  ? tirzepatide (MOUNJARO) 5 MG/0.5ML Pen Inject 5 mg into the skin once a week.  ? TRUE METRIX BLOOD GLUCOSE TEST test strip SMARTSIG:Via Meter 1-4 Times Daily  ? TRUEplus Lancets 30G MISC USE UP TO FOUR TIMES DAILY AS DIRECTED  ?  ? ?Allergies:   Patient has no known allergies.  ? ?ROS:   ?Please see the history of present illness.    ? ?All other systems reviewed and are negative. ? ? ?Labs/Other Tests and Data Reviewed:   ? ?Recent Labs: ?10/23/2021: TSH 3.690 ?11/20/2021: ALT 19; BUN 26; Creatinine, Ser 1.29; Hemoglobin 14.4; Platelets 267; Potassium 4.2; Sodium 140  ? ?Recent Lipid Panel ?Lab Results  ?Component Value Date/Time  ? CHOL 137 09/23/2021 12:23 PM  ? TRIG 162 (H) 09/23/2021 12:23 PM  ? HDL 45 09/23/2021 12:23 PM  ? CHOLHDL 3.0 09/23/2021 12:23 PM  ? CHOLHDL 3.0 05/06/2021 08:33 AM  ? LDLCALC 64 09/23/2021 12:23 PM  ? ? ?Wt Readings from Last 3 Encounters:  ?12/26/21 249 lb 1.9 oz (113 kg)  ?12/09/21 251 lb (113.9 kg)  ?11/20/21 250 lb (113.4 kg)  ?  ? ?Objective:   ? ?Vital Signs:  There were no vitals taken for this visit.  ? ? ? ?ASSESSMENT & PLAN:   ? ?Chronic Constipation ?-Encourage OTC prune juice to help with constipation ?-Prescribed Miralax and psyllium fiber supplement  ?- GI will see pt for her appt on 01/11/2022 for her Colonoscopy  ? ?Time:   ?Today, I have spent 20 minutes reviewing the chart, including problem list, medications, and with the patient with telehealth technology discussing the above problems. ? ? ?Medication Adjustments/Labs and Tests Ordered: ?Current  medicines are reviewed at length with the patient today.  Concerns regarding medicines are outlined above.  ? ?Tests Ordered: ?No orders of the defined types were placed in this encounter. ? ? ?Medication Changes: ?Meds ordered this encounter  ?Medications  ? polyethylene glycol powder (GLYCOLAX/MIRALAX) 17 GM/SCOOP powder  ?  Sig: Take 17 g by mouth daily.  ?  Dispense:  3350 g  ?  Refill:  1  ? Psyllium Fiber 0.52 g CAPS  ?  Sig: Take 3 capsules by mouth daily at 2 PM.  ?  Dispense:  180 capsule  ?  Refill:  0  ? ? ? ?  ?  ? ? ?Disposition:  Follow up PRN  ?Signed, ?Alvira Monday, FNP  ?12/29/2021 9:12 AM    ? ?  [facility] ?[facility] ?

## 2021-12-29 NOTE — Telephone Encounter (Signed)
Patient called and said she has not had a BM in a week and is so backed up she is in pain.  She said she has taken laxatives  and noting is working.  I told her we do not have any work ins today and she needs a call on what to do.   ?

## 2021-12-29 NOTE — Telephone Encounter (Signed)
Pt saw Kathryn Merritt today ?

## 2021-12-31 ENCOUNTER — Encounter (HOSPITAL_COMMUNITY): Payer: Self-pay

## 2021-12-31 ENCOUNTER — Emergency Department (HOSPITAL_COMMUNITY): Payer: 59

## 2021-12-31 ENCOUNTER — Other Ambulatory Visit: Payer: Self-pay

## 2021-12-31 ENCOUNTER — Emergency Department (HOSPITAL_COMMUNITY)
Admission: EM | Admit: 2021-12-31 | Discharge: 2021-12-31 | Disposition: A | Discharge status: Home / Self Care | Payer: 59 | Attending: Emergency Medicine | Admitting: Emergency Medicine

## 2021-12-31 DIAGNOSIS — I4891 Unspecified atrial fibrillation: Secondary | ICD-10-CM | POA: Diagnosis not present

## 2021-12-31 DIAGNOSIS — E119 Type 2 diabetes mellitus without complications: Secondary | ICD-10-CM | POA: Diagnosis not present

## 2021-12-31 DIAGNOSIS — K5900 Constipation, unspecified: Secondary | ICD-10-CM | POA: Insufficient documentation

## 2021-12-31 DIAGNOSIS — D72829 Elevated white blood cell count, unspecified: Secondary | ICD-10-CM | POA: Insufficient documentation

## 2021-12-31 DIAGNOSIS — Z7901 Long term (current) use of anticoagulants: Secondary | ICD-10-CM | POA: Diagnosis not present

## 2021-12-31 DIAGNOSIS — Z79899 Other long term (current) drug therapy: Secondary | ICD-10-CM | POA: Diagnosis not present

## 2021-12-31 DIAGNOSIS — Z7984 Long term (current) use of oral hypoglycemic drugs: Secondary | ICD-10-CM | POA: Diagnosis not present

## 2021-12-31 DIAGNOSIS — I10 Essential (primary) hypertension: Secondary | ICD-10-CM | POA: Insufficient documentation

## 2021-12-31 LAB — CBC WITH DIFFERENTIAL/PLATELET
Abs Immature Granulocytes: 0.04 10*3/uL (ref 0.00–0.07)
Basophils Absolute: 0 10*3/uL (ref 0.0–0.1)
Basophils Relative: 0 %
Eosinophils Absolute: 0.1 10*3/uL (ref 0.0–0.5)
Eosinophils Relative: 1 %
HCT: 41 % (ref 36.0–46.0)
Hemoglobin: 13.4 g/dL (ref 12.0–15.0)
Immature Granulocytes: 0 %
Lymphocytes Relative: 42 %
Lymphs Abs: 4.7 10*3/uL — ABNORMAL HIGH (ref 0.7–4.0)
MCH: 29.8 pg (ref 26.0–34.0)
MCHC: 32.7 g/dL (ref 30.0–36.0)
MCV: 91.1 fL (ref 80.0–100.0)
Monocytes Absolute: 0.8 10*3/uL (ref 0.1–1.0)
Monocytes Relative: 7 %
Neutro Abs: 5.6 10*3/uL (ref 1.7–7.7)
Neutrophils Relative %: 50 %
Platelets: 259 10*3/uL (ref 150–400)
RBC: 4.5 MIL/uL (ref 3.87–5.11)
RDW: 14.9 % (ref 11.5–15.5)
WBC: 11.2 10*3/uL — ABNORMAL HIGH (ref 4.0–10.5)
nRBC: 0 % (ref 0.0–0.2)

## 2021-12-31 LAB — COMPREHENSIVE METABOLIC PANEL
ALT: 19 U/L (ref 0–44)
AST: 14 U/L — ABNORMAL LOW (ref 15–41)
Albumin: 3.6 g/dL (ref 3.5–5.0)
Alkaline Phosphatase: 42 U/L (ref 38–126)
Anion gap: 7 (ref 5–15)
BUN: 30 mg/dL — ABNORMAL HIGH (ref 6–20)
CO2: 27 mmol/L (ref 22–32)
Calcium: 10.3 mg/dL (ref 8.9–10.3)
Chloride: 107 mmol/L (ref 98–111)
Creatinine, Ser: 1.16 mg/dL — ABNORMAL HIGH (ref 0.44–1.00)
GFR, Estimated: 54 mL/min — ABNORMAL LOW (ref >60)
Glucose, Bld: 78 mg/dL (ref 70–99)
Potassium: 3.9 mmol/L (ref 3.5–5.1)
Sodium: 141 mmol/L (ref 135–145)
Total Bilirubin: 0.4 mg/dL (ref 0.3–1.2)
Total Protein: 6.6 g/dL (ref 6.5–8.1)

## 2021-12-31 LAB — LIPASE, BLOOD: Lipase: 46 U/L (ref 11–51)

## 2021-12-31 LAB — POC OCCULT BLOOD, ED: Fecal Occult Bld: NEGATIVE

## 2021-12-31 NOTE — ED Provider Notes (Signed)
[facility] ?[facility] ?Provider Note ? ? ?CSN: [id number] ?Arrival date & time: 12/31/21  0816 ? ?  ? ?History ? ?Chief Complaint  ?Patient presents with  ? Constipation  ? ? ?Kathryn Merritt is a 61 y.o. female. ? ?HPI ? ?Patient with medical history including hypertension, A-fib currently on Eliquis, diabetes, presents to the emergency department complaints of constipation.  Patient notes that she does have difficulty with bowel movements but over the last 6 months she has had take laxatives daily for bowel movements, she notes over the last week she has been having little to no bowel movements.  She endorses that her last bowel movement was on Tuesday was a very small stool,  states that she started take Dulcolax on Sunday and then started to take MiraLAX daily starting Tuesday.  She states that she does not drink much water, she denies any stomach pain but endorses some pressure in the rectum area, had occasional nausea without vomiting, denies melena or hematuria dyspnea, no history of GI bleeds, patient has had 2 cesarean sections, she has no other significant abdominal history. ? ?Reviewed patient's charts has had a CT scan about 6 years ago which shows fatty liver disease.  Has not seen a GI doctor. ? ?Home Medications ?Prior to Admission medications   ?Medication Sig Start Date End Date Taking? Authorizing Provider  ?albuterol (VENTOLIN HFA) 108 (90 Base) MCG/ACT inhaler Inhale 2 puffs into the lungs every 6 (six) hours as needed for wheezing or shortness of breath. 11/20/21   Renee Rival, FNP  ?amLODipine (NORVASC) 10 MG tablet Take 1 tablet (10 mg total) by mouth daily. 03/25/21 12/09/21  Josue Hector, MD  ?apixaban (ELIQUIS) 5 MG TABS tablet TAKE ONE TABLET BY MOUTH TWICE DAILY 09/28/21   Josue Hector, MD  ?blood glucose meter kit and supplies Dispense based on patient and insurance preference. Use up to four times daily as directed. (FOR ICD-10 E10.9, E11.9). 10/01/21   Brita Romp, NP  ?Cholecalciferol (VITAMIN D3) 25 MCG (1000 UT) CAPS Take 1 capsule (1,000 Units total) by mouth daily. 09/24/21   Renee Rival, FNP  ?diclofenac Sodium (VOLTAREN) 1 % GEL Apply 2 g topically 4 (four) times daily. 12/26/21   Volney American, PA-C  ?doxycycline (VIBRAMYCIN) 100 MG capsule Take 1 capsule (100 mg total) by mouth 2 (two) times daily. ?Patient not taking: Reported on 12/29/2021 12/15/21   Jaynee Eagles, PA-C  ?glimepiride (AMARYL) 2 MG tablet Take 1 tablet (2 mg total) by mouth 2 (two) times daily. 10/21/21   Renee Rival, FNP  ?lidocaine (LIDODERM) 5 % Place 1 patch onto the skin daily. Remove & Discard patch within 12 hours or as directed by MD 10/24/21   Sherrill Raring, PA-C  ?lisinopril-hydrochlorothiazide (ZESTORETIC) 20-25 MG tablet Take 1 tablet by mouth daily. 12/24/21   Renee Rival, FNP  ?metoprolol succinate (TOPROL-XL) 50 MG 24 hr tablet TAKE ONE TABLET BY MOUTH DAILY. TAKE WITH OR IMMEDIATELY FOLLOWING A MEAL. 11/05/21   Minus Breeding, MD  ?omeprazole (PRILOSEC) 40 MG capsule Take 1 capsule (40 mg total) by mouth daily. 10/19/21   Renee Rival, FNP  ?polyethylene glycol powder (GLYCOLAX/MIRALAX) 17 GM/SCOOP powder Take 17 g by mouth daily. 12/29/21   Alvira Monday, [place]  ?predniSONE (DELTASONE) 20 MG tablet Take 2 tablets (40 mg total) by mouth daily with breakfast. 12/26/21   Volney American, PA-C  ?Psyllium Fiber 0.52 g CAPS Take 3 capsules  by mouth daily at 2 PM. 12/29/21   Alvira Monday, [place]  ?rosuvastatin (CRESTOR) 20 MG tablet Take 1 tablet (20 mg total) by mouth daily. 09/24/21   Renee Rival, FNP  ?sulfamethoxazole-trimethoprim (BACTRIM DS) 800-160 MG tablet Take 1 tablet by mouth 2 (two) times daily. ?Patient not taking: Reported on 12/29/2021 10/23/21   Renee Rival, FNP  ?tirzepatide [facility]) 5 MG/0.5ML Pen Inject 5 mg into the skin once a week. 10/01/21   Brita Romp, NP  ?TRUE METRIX BLOOD GLUCOSE TEST test strip  SMARTSIG:Via Meter 1-4 Times Daily 10/01/21   [provider]  ?TRUEplus Lancets 30G MISC USE UP TO FOUR TIMES DAILY AS DIRECTED 10/01/21   [provider]  ?   ? ?Allergies    ?Patient has no known allergies.   ? ?Review of Systems   ?Review of Systems  ?Constitutional:  Negative for chills and fever.  ?Respiratory:  Negative for shortness of breath.   ?Cardiovascular:  Negative for chest pain.  ?Gastrointestinal:  Positive for constipation, nausea and rectal pain. Negative for abdominal pain and vomiting.  ?Neurological:  Negative for headaches.  ? ?Physical Exam ?Updated Vital Signs ?BP (!) 129/57   Pulse (!) 48   Temp 98.1 ?F (36.7 ?C) (Oral)   Resp 17   Ht _0  (1.6 m)   Wt 113.4 kg   SpO2 99%   BMI 44.29 kg/m?  ?Physical Exam ?Vitals and nursing note reviewed. Exam conducted with a chaperone present.  ?Constitutional:   ?   General: She is not in acute distress. ?   Appearance: She is not ill-appearing.  ?HENT:  ?   Head: Normocephalic and atraumatic.  ?   Nose: No congestion.  ?Eyes:  ?   Conjunctiva/sclera: Conjunctivae normal.  ?Cardiovascular:  ?   Rate and Rhythm: Normal rate and regular rhythm.  ?   Pulses: Normal pulses.  ?   Heart sounds: No murmur heard. ?  No friction rub. No gallop.  ?Pulmonary:  ?   Effort: No respiratory distress.  ?   Breath sounds: No wheezing, rhonchi or rales.  ?Abdominal:  ?   General: There is no distension.  ?   Palpations: Abdomen is soft.  ?   Tenderness: There is no abdominal tenderness. There is no right CVA tenderness or left CVA tenderness.  ?   Comments: Abdomen nondistended normal active bowel sounds, dull to percussion, there is no guarding rebound tenderness, peritoneal sign, she had no tenderness during my exam.  ?Genitourinary: ?   Comments: With chaperone present rectal exam was performed, there is no noted external hemorrhoids, there is no noted internal hemorrhoids, she had a relatively large stool burden about 4 cm into the rectum,  unable to fully remove it, no melena or hematochezia Hemoccult negative ?Musculoskeletal:  ?   Right lower leg: No edema.  ?   Left lower leg: No edema.  ?Skin: ?   General: Skin is warm and dry.  ?Neurological:  ?   Mental Status: She is alert.  ?Psychiatric:     ?   Mood and Affect: Mood normal.  ? ? ?ED Results / Procedures / Treatments   ?Labs ?(all labs ordered are listed, but only abnormal results are displayed) ?Labs Reviewed  ?COMPREHENSIVE METABOLIC PANEL - Abnormal; Notable for the following components:  ?    Result Value  ? BUN 30 (*)   ? Creatinine, Ser 1.16 (*)   ? AST 14 (*)   ?  GFR, Estimated 54 (*)   ? All other components within normal limits  ?CBC WITH DIFFERENTIAL/PLATELET - Abnormal; Notable for the following components:  ? WBC 11.2 (*)   ? Lymphs Abs 4.7 (*)   ? All other components within normal limits  ?LIPASE, BLOOD  ?POC OCCULT BLOOD, ED  ? ? ?EKG ?None ? ?Radiology ?DG Abdomen 1 View ? ?Result Date: 12/31/2021 ?CLINICAL DATA:  Abdominal pain, constipation EXAM: ABDOMEN - 1 VIEW COMPARISON:  None. FINDINGS: Bowel gas pattern is nonspecific. Small to moderate amount of stool is seen in the colon and rectum. No abnormal masses or calcifications are seen. Kidneys are partly obscured by bowel contents limiting evaluation for small renal stones. Degenerative changes are noted in the lumbar spine. IMPRESSION: Nonspecific bowel gas pattern. Small to moderate amount of stool is seen in the colon and rectum. Electronically Signed   By: Elmer Picker M.D.   On: 12/31/2021 10:27   ? ?Procedures ?Procedures  ? ? ?Medications Ordered in ED ?Medications - No data to display ? ?ED Course/ Medical Decision Making/ A&P ?  ?                        ?Medical Decision Making ?Amount and/or Complexity of Data Reviewed ?Labs: ordered. ?Radiology: ordered. ? ? ?This patient presents to the ED for concern of constipation, this involves an extensive number of treatment options, and is a complaint that carries  with it a high risk of complications and morbidity.  The differential diagnosis includes bowel obstruction, GI bleed, diverticulitis ? ? ? ?Additional history obtained: ? ?Additional history obtained from N/A ?External records f

## 2021-12-31 NOTE — ED Triage Notes (Signed)
Patient with complaints of constipation for 1 week. Has tried stool softeners without relief.  ?

## 2021-12-31 NOTE — ED Notes (Signed)
Patient to beside commode. SSE successful, large BM noted in toilet. ?

## 2021-12-31 NOTE — Discharge Instructions (Signed)
Likely symptoms are result of constipation, I recommend increasing your water intake, if you notice that your urine is yellow you are not drinking enough fluids.  I also recommend increasing your fiber intake,  eats leafy greens,  legumes, grains this will help with bowel movements. also exercises will help with bowel movements.  Please continue with MiraLAX as prescribed to you. ? ?Please follow-up with PCP and/or GI for further eval. ? ?Come back to the emergency department if you develop chest pain, shortness of breath, severe abdominal pain, uncontrolled nausea, vomiting, diarrhea. ? ?

## 2022-01-06 ENCOUNTER — Ambulatory Visit: Payer: 59 | Admitting: Cardiology

## 2022-01-13 ENCOUNTER — Telehealth (INDEPENDENT_AMBULATORY_CARE_PROVIDER_SITE_OTHER): Payer: Self-pay | Admitting: *Deleted

## 2022-01-13 ENCOUNTER — Encounter: Payer: Self-pay | Admitting: Nurse Practitioner

## 2022-01-13 ENCOUNTER — Ambulatory Visit (INDEPENDENT_AMBULATORY_CARE_PROVIDER_SITE_OTHER): Payer: 59 | Admitting: Gastroenterology

## 2022-01-13 ENCOUNTER — Telehealth: Payer: Self-pay

## 2022-01-13 ENCOUNTER — Ambulatory Visit (INDEPENDENT_AMBULATORY_CARE_PROVIDER_SITE_OTHER): Payer: 59 | Admitting: Nurse Practitioner

## 2022-01-13 ENCOUNTER — Encounter: Payer: Self-pay | Admitting: Gastroenterology

## 2022-01-13 VITALS — BP 158/84 | HR 57 | Temp 97.3°F | Ht 63.0 in | Wt 252.0 lb

## 2022-01-13 VITALS — BP 102/67 | HR 54 | Ht 63.0 in | Wt 251.2 lb

## 2022-01-13 DIAGNOSIS — K5900 Constipation, unspecified: Secondary | ICD-10-CM | POA: Insufficient documentation

## 2022-01-13 DIAGNOSIS — E559 Vitamin D deficiency, unspecified: Secondary | ICD-10-CM

## 2022-01-13 DIAGNOSIS — E1165 Type 2 diabetes mellitus with hyperglycemia: Secondary | ICD-10-CM

## 2022-01-13 DIAGNOSIS — K5902 Outlet dysfunction constipation: Secondary | ICD-10-CM | POA: Insufficient documentation

## 2022-01-13 DIAGNOSIS — K219 Gastro-esophageal reflux disease without esophagitis: Secondary | ICD-10-CM

## 2022-01-13 DIAGNOSIS — Z1211 Encounter for screening for malignant neoplasm of colon: Secondary | ICD-10-CM | POA: Diagnosis not present

## 2022-01-13 DIAGNOSIS — R1319 Other dysphagia: Secondary | ICD-10-CM | POA: Diagnosis not present

## 2022-01-13 DIAGNOSIS — I10 Essential (primary) hypertension: Secondary | ICD-10-CM

## 2022-01-13 DIAGNOSIS — E782 Mixed hyperlipidemia: Secondary | ICD-10-CM | POA: Diagnosis not present

## 2022-01-13 LAB — POCT GLYCOSYLATED HEMOGLOBIN (HGB A1C): HbA1c, POC (controlled diabetic range): 6.5 % (ref 0.0–7.0)

## 2022-01-13 MED ORDER — LUBIPROSTONE 24 MCG PO CAPS: ORAL_CAPSULE | ORAL | 3 refills | Status: DC

## 2022-01-13 MED ORDER — TRULANCE 3 MG PO TABS: 3.0000 mg | ORAL_TABLET | Freq: Every day | ORAL | 5 refills | Status: DC

## 2022-01-13 NOTE — Telephone Encounter (Signed)
Attention: Preop ? ? ?We would like to request holding the following medication for patient please. ? ?Procedure: Colonoscopy/EGD ? ?Date: TBD ? ?Medication to hold: Eliquis x 48 hrs ? ?Surgeon: TBD ? ?Phone: [phone] ? ?Fax:  [phone] ? ?Type of Anesthesia: Propofol ? ? ? ? ? ? ?  ?

## 2022-01-13 NOTE — Telephone Encounter (Signed)
Patient's insurance will only cover symproic or trulance. Do you want to call one of this in for the patient? ?

## 2022-01-13 NOTE — Patient Instructions (Signed)
Diabetes Mellitus and Foot Care Foot care is an important part of your health, especially when you have diabetes. Diabetes may cause you to have problems because of poor blood flow (circulation) to your feet and legs, which can cause your skin to: Become thinner and drier. Break more easily. Heal more slowly. Peel and crack. You may also have nerve damage (neuropathy) in your legs and feet, causing decreased feeling in them. This means that you may not notice minor injuries to your feet that could lead to more serious problems. Noticing and addressing any potential problems early is the best way to prevent future foot problems. How to care for your feet Foot hygiene  Wash your feet daily with warm water and mild soap. Do not use hot water. Then, pat your feet and the areas between your toes until they are completely dry. Do not soak your feet as this can dry your skin. Trim your toenails straight across. Do not dig under them or around the cuticle. File the edges of your nails with an emery board or nail file. Apply a moisturizing lotion or petroleum jelly to the skin on your feet and to dry, brittle toenails. Use lotion that does not contain alcohol and is unscented. Do not apply lotion between your toes. Shoes and socks Wear clean socks or stockings every day. Make sure they are not too tight. Do not wear knee-high stockings since they may decrease blood flow to your legs. Wear shoes that fit properly and have enough cushioning. Always look in your shoes before you put them on to be sure there are no objects inside. To break in new shoes, wear them for just a few hours a day. This prevents injuries on your feet. Wounds, scrapes, corns, and calluses  Check your feet daily for blisters, cuts, bruises, sores, and redness. If you cannot see the bottom of your feet, use a mirror or ask someone for help. Do not cut corns or calluses or try to remove them with medicine. If you find a minor scrape,  cut, or break in the skin on your feet, keep it and the skin around it clean and dry. You may clean these areas with mild soap and water. Do not clean the area with peroxide, alcohol, or iodine. If you have a wound, scrape, corn, or callus on your foot, look at it several times a day to make sure it is healing and not infected. Check for: Redness, swelling, or pain. Fluid or blood. Warmth. Pus or a bad smell. General tips Do not cross your legs. This may decrease blood flow to your feet. Do not use heating pads or hot water bottles on your feet. They may burn your skin. If you have lost feeling in your feet or legs, you may not know this is happening until it is too late. Protect your feet from hot and cold by wearing shoes, such as at the beach or on hot pavement. Schedule a complete foot exam at least once a year (annually) or more often if you have foot problems. Report any cuts, sores, or bruises to your health care provider immediately. Where to find more information American Diabetes Association: www.diabetes.org Association of Diabetes Care & Education Specialists: www.diabeteseducator.org Contact a health care provider if: You have a medical condition that increases your risk of infection and you have any cuts, sores, or bruises on your feet. You have an injury that is not healing. You have redness on your legs or feet. You   feel burning or tingling in your legs or feet. You have pain or cramps in your legs and feet. Your legs or feet are numb. Your feet always feel cold. You have pain around any toenails. Get help right away if: You have a wound, scrape, corn, or callus on your foot and: You have pain, swelling, or redness that gets worse. You have fluid or blood coming from the wound, scrape, corn, or callus. Your wound, scrape, corn, or callus feels warm to the touch. You have pus or a bad smell coming from the wound, scrape, corn, or callus. You have a fever. You have a red  line going up your leg. Summary Check your feet every day for blisters, cuts, bruises, sores, and redness. Apply a moisturizing lotion or petroleum jelly to the skin on your feet and to dry, brittle toenails. Wear shoes that fit properly and have enough cushioning. If you have foot problems, report any cuts, sores, or bruises to your health care provider immediately. Schedule a complete foot exam at least once a year (annually) or more often if you have foot problems. This information is not intended to replace advice given to you by your health care provider. Make sure you discuss any questions you have with your health care provider. Document Revised: 03/27/2020 Document Reviewed: 03/27/2020 Elsevier Patient Education  2023 [employer]  

## 2022-01-13 NOTE — Addendum Note (Signed)
Addended by: Mahala Menghini on: 01/13/2022 11:08 AM ? ? Modules accepted: Orders ? ?

## 2022-01-13 NOTE — Patient Instructions (Addendum)
Try Amitiza 36mg once to twice daily for constipation.  ?Once you start Amitiza, you can stop Miralax. ?Hold Fiber right now due to your swallowing issues.  ?Once we get approval to hold Eliquis from your cardiologist, we will call to schedule an upper endoscopy and colonoscopy. ? ?High-Fiber Eating Plan ?Fiber, also called dietary fiber, is a type of carbohydrate. It is found foods such as fruits, vegetables, whole grains, and beans. A high-fiber diet can have many health benefits. Your health care provider may recommend a high-fiber diet to help: ?Prevent constipation. Fiber can make your bowel movements more regular. ?Lower your cholesterol. ?Relieve the following conditions: ?Inflammation of veins in the anus (hemorrhoids). ?Inflammation of specific areas of the digestive tract (uncomplicated diverticulosis). ?A problem of the large intestine, also called the colon, that sometimes causes pain and diarrhea (irritable bowel syndrome, or IBS). ?Prevent overeating as part of a weight-loss plan. ?Prevent heart disease, type 2 diabetes, and certain cancers. ?What are tips for following this plan? ?Reading food labels ? ?Check the nutrition facts label on food products for the amount of dietary fiber. Choose foods that have 5 grams of fiber or more per serving. ?The goals for recommended daily fiber intake include: ?Men (age 9214or younger): 34-38 g. ?Men (over age 60: 28-34 g. ?Women (age 9218or younger): 25-28 g. ?Women (over age 60: 22-25 g. ?Your daily fiber goal is _____________ g. ?Shopping ?Choose whole fruits and vegetables instead of processed forms, such as apple juice or applesauce. ?Choose a wide variety of high-fiber foods such as avocados, lentils, oats, and kidney beans. ?Read the nutrition facts label of the foods you choose. Be aware of foods with added fiber. These foods often have high sugar and sodium amounts per serving. ?Cooking ?Use whole-grain flour for baking and cooking. ?Cook with brown rice  instead of white rice. ?Meal planning ?Start the day with a breakfast that is high in fiber, such as a cereal that contains 5 g of fiber or more per serving. ?Eat breads and cereals that are made with whole-grain flour instead of refined flour or white flour. ?Eat brown rice, bulgur wheat, or millet instead of white rice. ?Use beans in place of meat in soups, salads, and pasta dishes. ?Be sure that half of the grains you eat each day are whole grains. ?General information ?You can get the recommended daily intake of dietary fiber by: ?Eating a variety of fruits, vegetables, grains, nuts, and beans. ?Taking a fiber supplement if you are not able to take in enough fiber in your diet. It is better to get fiber through food than from a supplement. ?Gradually increase how much fiber you consume. If you increase your intake of dietary fiber too quickly, you may have bloating, cramping, or gas. ?Drink plenty of water to help you digest fiber. ?Choose high-fiber snacks, such as berries, raw vegetables, nuts, and popcorn. ?What foods should I eat? ?Fruits ?Berries. Pears. Apples. Oranges. Avocado. Prunes and raisins. Dried figs. ?Vegetables ?Sweet potatoes. Spinach. Kale. Artichokes. Cabbage. Broccoli. Cauliflower. Green peas. Carrots. Squash. ?Grains ?Whole-grain breads. Multigrain cereal. Oats and oatmeal. Brown rice. Barley. Bulgur wheat. [place] Quinoa. Bran muffins. Popcorn. Rye wafer crackers. ?Meats and other proteins ?Navy beans, kidney beans, and pinto beans. Soybeans. Split peas. Lentils. Nuts and seeds. ?Dairy ?Fiber-fortified yogurt. ?Beverages ?Fiber-fortified soy milk. Fiber-fortified orange juice. ?Other foods ?Fiber bars. ?The items listed above may not be a complete list of recommended foods and beverages. Contact a dietitian for more information. ?What  foods should I avoid? ?Fruits ?Fruit juice. Cooked, strained fruit. ?Vegetables ?Fried potatoes. Canned vegetables. Well-cooked vegetables. ?Grains ?White  bread. Pasta made with refined flour. White rice. ?Meats and other proteins ?Fatty cuts of meat. Fried chicken or fried fish. ?Dairy ?Milk. Yogurt. Cream cheese. Sour cream. ?Fats and oils ?Butters. ?Beverages ?Soft drinks. ?Other foods ?Cakes and pastries. ?The items listed above may not be a complete list of foods and beverages to avoid. Talk with your dietitian about what choices are best for you. ?Summary ?Fiber is a type of carbohydrate. It is found in foods such as fruits, vegetables, whole grains, and beans. ?A high-fiber diet has many benefits. It can help to prevent constipation, lower blood cholesterol, aid weight loss, and reduce your risk of heart disease, diabetes, and certain cancers. ?Increase your intake of fiber gradually. Increasing fiber too quickly may cause cramping, bloating, and gas. Drink plenty of water while you increase the amount of fiber you consume. ?The best sources of fiber include whole fruits and vegetables, whole grains, nuts, seeds, and beans. ?This information is not intended to replace advice given to you by your health care provider. Make sure you discuss any questions you have with your health care provider. ?Document Revised: 01/10/2020 Document Reviewed: 01/10/2020 ?Elsevier Patient Education ? [place]. ? ?

## 2022-01-13 NOTE — Telephone Encounter (Signed)
PA for Trulance 3 mg tablet has been approved from 01/13/2022 through 01/14/2023. Approval letter to be scanned into patient's chart.  ?

## 2022-01-13 NOTE — Telephone Encounter (Signed)
Communication noted, will start on PA once fax is received.  ?

## 2022-01-13 NOTE — Telephone Encounter (Signed)
Patient called in --saw Kathryn Merritt this morning and she called in medication to her pharmacy --it is not covered by her insurance --they are to fax over to [place]. ? ?Her questions is 1. Will Kathryn Merritt change this or get approved  ?2. Stay on Miralax  ? ?Spoke to [place] and she will get PA started once she get it and do ASAP  ? ?Meantime stay on Miralax ? ? ?

## 2022-01-13 NOTE — Telephone Encounter (Signed)
Patient with diagnosis of afib on Eliquis for anticoagulation.   ? ?Procedure: colonoscopy/EGD ?Date of procedure: TBD ? ?CHA2DS2-VASc Score = 3  ?This indicates a 3.2% annual risk of stroke. ?The patient's score is based upon: ?CHF History: 0 ?HTN History: 1 ?Diabetes History: 1 ?Stroke History: 0 ?Vascular Disease History: 0 ?Age Score: 0 ?Gender Score: 1 ?  ?Pt underwent DCCV 11/20/21. ? ?CrCl 38m/min using adjusted body weight due to obesity ?Platelet count 259K ? ?Per office protocol, patient can hold Eliquis for 2 days prior to procedure as requested.   ?

## 2022-01-13 NOTE — Progress Notes (Signed)
? ?                                                    Endocrinology Follow Up Note  ?     01/13/2022, 4:07 PM ? ? ?Subjective:  ? ? Patient ID: Kathryn Merritt, female    DOB: Apr 29, 1961.  ?Kathryn Merritt is being seen in follow up after being seen in consultation for management of currently uncontrolled symptomatic diabetes requested by  Renee Rival, FNP. ? ? ?Past Medical History:  ?Diagnosis Date  ? Chronic back pain   ? Diabetes mellitus   ? Edema leg   ? Hyperlipidemia   ? Hypertension   ? Joint pain   ? Kidney stones   ? Other malaise and fatigue   ? PAF (paroxysmal atrial fibrillation) ([place])   ? a. new diagnosis in 02/2021 --> s/p DCCV on 03/18/2021  ? RBBB   ? Sinus bradycardia   ? Sleep apnea   ? ? ?Past Surgical History:  ?Procedure Laterality Date  ? CARDIOVERSION N/A 03/18/2021  ? Procedure: CARDIOVERSION;  Surgeon: Satira Sark, MD;  Location: [facility];  Service: Cardiovascular;  Laterality: N/A;  ? CESAREAN SECTION    ? times 2  ? INCISION AND DRAINAGE ABSCESS Right   ? axilla  ? LITHOTRIPSY    ? ? ?Social History  ? ?Socioeconomic History  ? Marital status: Married  ?  Spouse name: Not on file  ? Number of children: 2  ? Years of education: Not on file  ? Highest education level: Not on file  ?Occupational History  ? Occupation: Occupational psychologist- Teacher  ?Tobacco Use  ? Smoking status: Never  ? Smokeless tobacco: Never  ?Vaping Use  ? Vaping Use: Never used  ?Substance and Sexual Activity  ? Alcohol use: No  ? Drug use: No  ? Sexual activity: Not Currently  ?Other Topics Concern  ? Not on file  ?Social History Narrative  ? Live with her husband, works at [employer],   ? ?Social Determinants of Health  ? ?Financial Resource Strain: Not on file  ?Food Insecurity: Not on file  ?Transportation Needs: Not on file  ?Physical Activity: Not on file  ?Stress: Not on file  ?Social Connections: Not on file  ? ? ?Family History  ?Problem Relation Age of  Onset  ? Hypertension Mother   ? Cancer Father   ? Hypertension Father   ? Cancer Brother   ? Heart disease Paternal Grandmother   ? Breast cancer Neg Hx   ? Colon cancer Neg Hx   ? ? ?Outpatient Encounter Medications as of 01/13/2022  ?Medication Sig  ? albuterol (VENTOLIN HFA) 108 (90 Base) MCG/ACT inhaler Inhale 2 puffs into the lungs every 6 (six) hours as needed for wheezing or shortness of breath.  ? amLODipine (NORVASC) 10 MG tablet Take 1 tablet (10 mg total) by mouth daily.  ? apixaban (ELIQUIS) 5 MG TABS tablet TAKE ONE TABLET BY MOUTH TWICE DAILY  ? blood glucose meter kit and supplies Dispense based on patient and insurance preference. Use up to four times daily as directed. (FOR ICD-10 E10.9, E11.9).  ? Cholecalciferol (VITAMIN D3) 25 MCG (1000 UT) CAPS Take 1 capsule (1,000 Units total) by mouth daily.  ? diclofenac Sodium (VOLTAREN) 1 %  GEL Apply 2 g topically 4 (four) times daily.  ? lisinopril-hydrochlorothiazide (ZESTORETIC) 20-25 MG tablet Take 1 tablet by mouth daily.  ? metoprolol succinate (TOPROL-XL) 50 MG 24 hr tablet TAKE ONE TABLET BY MOUTH DAILY. TAKE WITH OR IMMEDIATELY FOLLOWING A MEAL.  ? omeprazole (PRILOSEC) 40 MG capsule Take 1 capsule (40 mg total) by mouth daily.  ? Plecanatide (TRULANCE) 3 MG TABS Take 3 mg by mouth daily.  ? polyethylene glycol powder (GLYCOLAX/MIRALAX) 17 GM/SCOOP powder Take 17 g by mouth daily.  ? Psyllium Fiber 0.52 g CAPS Take 3 capsules by mouth daily at 2 PM.  ? rosuvastatin (CRESTOR) 20 MG tablet Take 1 tablet (20 mg total) by mouth daily.  ? tirzepatide (MOUNJARO) 5 MG/0.5ML Pen Inject 5 mg into the skin once a week.  ? TRUE METRIX BLOOD GLUCOSE TEST test strip SMARTSIG:Via Meter 1-4 Times Daily  ? TRUEplus Lancets 30G MISC USE UP TO FOUR TIMES DAILY AS DIRECTED  ? [DISCONTINUED] glimepiride (AMARYL) 2 MG tablet Take 1 tablet (2 mg total) by mouth 2 (two) times daily.  ? [DISCONTINUED] lubiprostone (AMITIZA) 24 MCG capsule Take one cap once to twice daily  with food for constipation.  ? ?No facility-administered encounter medications on file as of 01/13/2022.  ? ? ?ALLERGIES: ?No Known Allergies ? ?VACCINATION STATUS: ?Immunization History  ?Administered Date(s) Administered  ? Influenza,inj,Quad PF,6+ Mos 06/01/2021  ? Influenza,trivalent, recombinat, inj, PF 09/07/2014  ? Moderna SARS-COV2 Booster Vaccination 09/30/2020  ? Moderna Sars-Covid-2 Vaccination 11/20/2019, 12/18/2019  ? ? ?Diabetes ?She presents for her follow-up diabetic visit. She has type 2 diabetes mellitus. Onset time: diagnosed at approx age of 71. Her disease course has been improving. Hypoglycemia symptoms include sleepiness, sweats and tremors. Associated symptoms include fatigue. Pertinent negatives for diabetes include no polydipsia, no polyuria and no weight loss. There are no hypoglycemic complications. Symptoms are improving. Diabetic complications include heart disease (Afib) and nephropathy. Risk factors for coronary artery disease include diabetes mellitus, dyslipidemia, family history, obesity, hypertension, post-menopausal and sedentary lifestyle. Current diabetic treatment includes oral agent (monotherapy) (and Mounjaro). She is compliant with treatment most of the time. Her weight is decreasing steadily. She is following a generally healthy diet. When asked about meal planning, she reported none. She has not had a previous visit with a dietitian. She rarely participates in exercise. Her home blood glucose trend is decreasing steadily. Her breakfast blood glucose range is generally 110-130 mg/dl. Her overall blood glucose range is 110-130 mg/dl. (She presents today with her logs, no meter, showing at target fasting and tight postprandial glycemic profile.  Her POCT A1c today is 6.5%, improving from last visit of 8.8%.  She does report symptoms of low glucose at times.  She did have a steroid shot in her shoulder since last visit and her glucose spiked over 300 afterwards but has since  resolved.) An ACE inhibitor/angiotensin II receptor blocker is being taken. She sees a podiatrist.Eye exam is current (having next exam sometime next week).  ?Hypertension ?This is a chronic problem. The current episode started more than 1 year ago. The problem has been resolved since onset. The problem is controlled. Associated symptoms include sweats. There are no associated agents to hypertension. Risk factors for coronary artery disease include diabetes mellitus, dyslipidemia, family history, obesity, post-menopausal state and sedentary lifestyle. Past treatments include diuretics, beta blockers, ACE inhibitors and calcium channel blockers. The current treatment provides moderate improvement. Compliance problems include diet and exercise.  Hypertensive end-organ damage includes kidney disease.  Identifiable causes of hypertension include chronic renal disease.  ?Hyperlipidemia ?This is a chronic problem. The current episode started more than 1 year ago. The problem is uncontrolled. Recent lipid tests were reviewed and are variable. Exacerbating diseases include chronic renal disease, diabetes and obesity. Factors aggravating her hyperlipidemia include beta blockers, fatty foods and thiazides. Current antihyperlipidemic treatment includes statins. The current treatment provides mild improvement of lipids. Compliance problems include adherence to diet and adherence to exercise.  Risk factors for coronary artery disease include diabetes mellitus, dyslipidemia, family history, obesity, hypertension, a sedentary lifestyle and post-menopausal.  ? ? ?Review of systems ? ?Constitutional: + Minimally fluctuating body weight,  current Body mass index is 44.5 kg/m?. , no fatigue, no subjective hyperthermia, no subjective hypothermia ?Eyes: no blurry vision, no xerophthalmia ?ENT: no sore throat, no nodules palpated in throat, no dysphagia/odynophagia, no hoarseness ?Cardiovascular: no chest pain, no shortness of breath, no  palpitations, no leg swelling ?Respiratory: no cough, no shortness of breath ?Gastrointestinal: no nausea/vomiting/diarrhea ?Musculoskeletal: no muscle/joint aches ?Skin: no rashes, no hyperemia ?Neurolo

## 2022-01-13 NOTE — Telephone Encounter (Signed)
I'm fine with trying Trulance. She should stop miralax after she gets Trulance started.  ? ?I actually looked on her insurance website to see what was covered and it said generic amitiza. Please let pt know my apologies.  ?

## 2022-01-13 NOTE — Progress Notes (Signed)
? ? ? ?GI Office Note   ? ?Referring Provider: No ref. provider found ?Primary Care Physician:  Renee Rival, FNP  ?Primary Gastroenterologist: Elon Alas. Abbey Chatters, DO ? ? ?Chief Complaint  ? ?Chief Complaint  ?Patient presents with  ? Constipation  ?  Pt is currently taking miralax once daily and fiber capsules three times daily. Pt states that she is going daily but doesn't feel like it is quite enough that is produced.   ? Colonoscopy  ? Gastroesophageal Reflux  ?  Currently on omeprazole once daily, however states that here lately when she burps it is a horrible smell and taste.   ? ? ? ?History of Present Illness  ? ?Kathryn Merritt is a 61 y.o. female presenting today requesting Vena Rua FNP for consideration of colonoscopy.  She was brought in for further discussion because she is on Eliquis. ? ?Patient states she has chronic intermittent constipation, generally well managed with over-the-counter regimens.  Recently had to go to the ED because of impaction.  Tried several over-the-counter laxatives, bisacodyl products without relief.  Recently started on MiraLAX 17 g daily along with fiber capsules 3 daily just prior to her ED evaluation. Seen in the ED April 13 due to constipation.  On digital rectal exam she had relatively large stool burden about 4 cm into the rectum unable to fully remove, no melena or hematochezia, heme-negative stool.  Patient was provided with an enema, she had a large bowel movement and felt better prior to discharge. ? ?Small stools every day now. Stools are bristol 3.  Feels like stool is incomplete.  No melena, brbpr. Chronic persistent abdominal discomfort, vague description.  She is not really sure what makes her symptoms worse or better but states that her pain is chronic and just learned to live with it.  She has chronic heartburn for more than 5 years.  Typical heartburn controlled on omeprazole. New gas, horrible smell with belching. Solid food and pill dysphagia  chronically.  Recently had a fiber pill stick in her esophagus, she had to throw it up to get relief.   ? ?Patient has a history of A-fib, first episode in June 2022.  She underwent cardioversion at that time.  She had another episode of A-fib, seen in the ED approximately 1 month ago.  Underwent cardioversion again.  Followed up with her cardiologist March 22, they are discussing possibility of ablation especially if she has another episode.  She has recent diagnosis of sleep apnea, goes for CPAP machine today. ? ?No prior EGD/colonoscopy. ?  ? ?Medications  ? ?Current Outpatient Medications  ?Medication Sig Dispense Refill  ? albuterol (VENTOLIN HFA) 108 (90 Base) MCG/ACT inhaler Inhale 2 puffs into the lungs every 6 (six) hours as needed for wheezing or shortness of breath. 8.5 g 0  ? amLODipine (NORVASC) 10 MG tablet Take 1 tablet (10 mg total) by mouth daily. 90 tablet 3  ? apixaban (ELIQUIS) 5 MG TABS tablet TAKE ONE TABLET BY MOUTH TWICE DAILY 60 tablet 5  ? blood glucose meter kit and supplies Dispense based on patient and insurance preference. Use up to four times daily as directed. (FOR ICD-10 E10.9, E11.9). 1 each 0  ? Cholecalciferol (VITAMIN D3) 25 MCG (1000 UT) CAPS Take 1 capsule (1,000 Units total) by mouth daily. 60 capsule 3  ? diclofenac Sodium (VOLTAREN) 1 % GEL Apply 2 g topically 4 (four) times daily. 150 g 2  ? glimepiride (AMARYL) 2 MG tablet Take  1 tablet (2 mg total) by mouth 2 (two) times daily. 60 tablet 3  ? lisinopril-hydrochlorothiazide (ZESTORETIC) 20-25 MG tablet Take 1 tablet by mouth daily. 60 tablet 2  ? metoprolol succinate (TOPROL-XL) 50 MG 24 hr tablet TAKE ONE TABLET BY MOUTH DAILY. TAKE WITH OR IMMEDIATELY FOLLOWING A MEAL. 90 tablet 3  ? omeprazole (PRILOSEC) 40 MG capsule Take 1 capsule (40 mg total) by mouth daily. 30 capsule 3  ? polyethylene glycol powder (GLYCOLAX/MIRALAX) 17 GM/SCOOP powder Take 17 g by mouth daily. 3350 g 1  ? Psyllium Fiber 0.52 g CAPS Take 3  capsules by mouth daily at 2 PM. 180 capsule 0  ? rosuvastatin (CRESTOR) 20 MG tablet Take 1 tablet (20 mg total) by mouth daily. 90 tablet 3  ? tirzepatide [facility]) 5 MG/0.5ML Pen Inject 5 mg into the skin once a week. 6 mL 3  ? TRUE METRIX BLOOD GLUCOSE TEST test strip SMARTSIG:Via Meter 1-4 Times Daily    ? TRUEplus Lancets 30G MISC USE UP TO FOUR TIMES DAILY AS DIRECTED    ? ?No current facility-administered medications for this visit.  ? ? ?Allergies  ? ?Allergies as of 01/13/2022  ? (No Known Allergies)  ? ? ?Past Medical History  ? ?Past Medical History:  ?Diagnosis Date  ? Chronic back pain   ? Diabetes mellitus   ? Edema leg   ? Hyperlipidemia   ? Hypertension   ? Joint pain   ? Kidney stones   ? Other malaise and fatigue   ? PAF (paroxysmal atrial fibrillation) ([place])   ? a. new diagnosis in 02/2021 --> s/p DCCV on 03/18/2021  ? RBBB   ? Sinus bradycardia   ? Sleep apnea   ? ? ?Past Surgical History  ? ?Past Surgical History:  ?Procedure Laterality Date  ? CARDIOVERSION N/A 03/18/2021  ? Procedure: CARDIOVERSION;  Surgeon: Satira Sark, MD;  Location: [facility];  Service: Cardiovascular;  Laterality: N/A;  ? CESAREAN SECTION    ? times 2  ? INCISION AND DRAINAGE ABSCESS Right   ? axilla  ? LITHOTRIPSY    ? ? ?Past Family History  ? ?Family History  ?Problem Relation Age of Onset  ? Hypertension Mother   ? Cancer Father   ? Hypertension Father   ? Cancer Brother   ? Heart disease Paternal Grandmother   ? Breast cancer Neg Hx   ? Colon cancer Neg Hx   ? ? ?Past Social History  ? ?Social History  ? ?Socioeconomic History  ? Marital status: Married  ?  Spouse name: Not on file  ? Number of children: 2  ? Years of education: Not on file  ? Highest education level: Not on file  ?Occupational History  ? Occupation: Occupational psychologist- Teacher  ?Tobacco Use  ? Smoking status: Never  ? Smokeless tobacco: Never  ?Vaping Use  ? Vaping Use: Never used  ?Substance and Sexual Activity  ? Alcohol use: No  ?  Drug use: No  ? Sexual activity: Not Currently  ?Other Topics Concern  ? Not on file  ?Social History Narrative  ? Live with her husband, works at [employer],   ? ?Social Determinants of Health  ? ?Financial Resource Strain: Not on file  ?Food Insecurity: Not on file  ?Transportation Needs: Not on file  ?Physical Activity: Not on file  ?Stress: Not on file  ?Social Connections: Not on file  ?Intimate Partner Violence: Not on file  ? ? ?  Review of Systems  ? ?General: Negative for anorexia, unintentional weight loss, fever, chills, fatigue, weakness. ?Eyes: Negative for vision changes.  ?ENT: Negative for hoarseness,  nasal congestion. See hpi ?CV: Negative for chest pain, angina, palpitations, dyspnea on exertion, peripheral edema. See hpi ?Respiratory: Negative for dyspnea at rest, dyspnea on exertion, cough, sputum, wheezing.  ?GI: See history of present illness. ?GU:  Negative for dysuria, hematuria, urinary incontinence, urinary frequency, nocturnal urination.  ?MS: Negative for joint pain, low back pain.  ?Derm: Negative for rash or itching.  ?Neuro: Negative for weakness, abnormal sensation, seizure, frequent headaches, memory loss,  ?confusion.  ?Psych: Negative for anxiety, depression, suicidal ideation, hallucinations.  ?Endo: Negative for unusual weight change.  ?Heme: Negative for bruising or bleeding. ?Allergy: Negative for rash or hives. ? ?Physical Exam  ? ?BP (!) 158/84 (BP Location: Right Arm, Patient Position: Sitting, Cuff Size: Large)   Pulse (!) 57   Temp (!) 97.3 ?F (36.3 ?C) (Temporal)   Ht _0  (1.6 m)   Wt 252 lb (114.3 kg)   SpO2 97%   BMI 44.64 kg/m?  ?  ?General: Well-nourished, well-developed in no acute distress.  ?Head: Normocephalic, atraumatic.   ?Eyes: Conjunctiva pink, no icterus. ?Mouth: Oropharyngeal mucosa moist and pink , no lesions erythema or exudate. ?Neck: Supple without thyromegaly, masses, or lymphadenopathy.  ?Lungs: Clear to auscultation  bilaterally.  ?Heart: Regular rate and rhythm, no murmurs rubs or gallops.  ?Abdomen: Bowel sounds are normal, nontender, nondistended, no hepatosplenomegaly or masses,  ?no abdominal bruits or hernia, no rebound or guardin

## 2022-01-13 NOTE — Telephone Encounter (Signed)
Pt was made aware.  

## 2022-01-14 NOTE — Telephone Encounter (Signed)
? ? ?  Name: Kathryn Merritt  ?DOB: 26-Jun-1961  ?MRN: [id number] ? ?Primary Cardiologist: None ? ? ?Preoperative team, please contact this patient and set up a phone call appointment for further preoperative risk assessment. Please obtain consent and complete medication review. Thank you for your help. ? ?I confirm that guidance regarding antiplatelet and oral anticoagulation therapy has been completed and, if necessary, noted below. ? ? ? ?Christell Faith, PA-C ?01/14/2022, 1:37 PM ?[facility] ?[address] ?[place], [place] [address] ? ? ?

## 2022-01-15 ENCOUNTER — Telehealth: Payer: Self-pay | Admitting: *Deleted

## 2022-01-15 ENCOUNTER — Ambulatory Visit (INDEPENDENT_AMBULATORY_CARE_PROVIDER_SITE_OTHER): Payer: 59 | Admitting: Physician Assistant

## 2022-01-15 DIAGNOSIS — Z01810 Encounter for preprocedural cardiovascular examination: Secondary | ICD-10-CM | POA: Diagnosis not present

## 2022-01-15 NOTE — Telephone Encounter (Signed)
?  Patient Consent for Virtual Visit  ? ? ?   ? ?[name] has provided verbal consent on 01/15/2022 for a virtual visit (video or telephone). ? ? ?CONSENT FOR VIRTUAL VISIT FOR:  Kathryn Merritt  ?By participating in this virtual visit I agree to the following: ? ?I hereby voluntarily request, consent and authorize [facility] and its employed or contracted physicians, physician assistants, nurse practitioners or other licensed health care professionals (the Practitioner), to provide me with telemedicine health care services (the ?Services") as deemed necessary by the treating Practitioner. I acknowledge and consent to receive the Services by the Practitioner via telemedicine. I understand that the telemedicine visit will involve communicating with the Practitioner through live audiovisual communication technology and the disclosure of certain medical information by electronic transmission. I acknowledge that I have been given the opportunity to request an in-person assessment or other available alternative prior to the telemedicine visit and am voluntarily participating in the telemedicine visit. ? ?I understand that I have the right to withhold or withdraw my consent to the use of telemedicine in the course of my care at any time, without affecting my right to future care or treatment, and that the Practitioner or I may terminate the telemedicine visit at any time. I understand that I have the right to inspect all information obtained and/or recorded in the course of the telemedicine visit and may receive copies of available information for a reasonable fee.  I understand that some of the potential risks of receiving the Services via telemedicine include:  ?Delay or interruption in medical evaluation due to technological equipment failure or disruption; ?Information transmitted may not be sufficient (e.g. poor resolution of images) to allow for appropriate medical decision making by the Practitioner; and/or   ?In rare instances, security protocols could fail, causing a breach of personal health information. ? ?Furthermore, I acknowledge that it is my responsibility to provide information about my medical history, conditions and care that is complete and accurate to the best of my ability. I acknowledge that Practitioner's advice, recommendations, and/or decision may be based on factors not within their control, such as incomplete or inaccurate data provided by me or distortions of diagnostic images or specimens that may result from electronic transmissions. I understand that the practice of medicine is not an exact science and that Practitioner makes no warranties or guarantees regarding treatment outcomes. I acknowledge that a copy of this consent can be made available to me via my patient portal ([facility]), or I can request a printed copy by calling the office of [facility].   ? ?I understand that my insurance will be billed for this visit.  ? ?I have read or had this consent read to me. ?I understand the contents of this consent, which adequately explains the benefits and risks of the Services being provided via telemedicine.  ?I have been provided ample opportunity to ask questions regarding this consent and the Services and have had my questions answered to my satisfaction. ?I give my informed consent for the services to be provided through the use of telemedicine in my medical care ? ? ? ?

## 2022-01-15 NOTE — Telephone Encounter (Signed)
Spoke with patient and scheduled her for a telehealth preop clearance appt for today at 1:40 PM. Consent in and meds reviewed. ?

## 2022-01-15 NOTE — Progress Notes (Signed)
? ?Virtual Visit via Telephone Note  ? ?This visit type was conducted due to national recommendations for restrictions regarding the COVID-19 Pandemic (e.g. social distancing) in an effort to limit this patient's exposure and mitigate transmission in our community.  Due to her co-morbid illnesses, this patient is at least at moderate risk for complications without adequate follow up.  This format is felt to be most appropriate for this patient at this time.  The patient did not have access to video technology/had technical difficulties with video requiring transitioning to audio format only (telephone).  All issues noted in this document were discussed and addressed.  No physical exam could be performed with this format.  Please refer to the patient's chart for her  consent to telehealth for [facility]. ? ?Evaluation Performed:  Preoperative cardiovascular risk assessment ?_____________  ? ?Date:  01/15/2022  ? ?Patient ID:  Courtnie, Brenes 16-Jan-1961, MRN [id number] ?Patient Location:  ?Home ?Provider location:   ?Office ? ?Primary Care Provider:  Renee Rival, FNP ?Primary Cardiologist:  [facility] ? ?Chief Complaint  ?  ?61 y.o. y/o female with a h/o PAF s/p DCCV in 02/2021 and again in 11/2021 on Eliquis, DM2, HTN, HLD, sleep apnea, and RBBB, who is pending colonoscopy and EGD, and presents today for telephonic preoperative cardiovascular risk assessment. ? ?Past Medical History  ?  ?Past Medical History:  ?Diagnosis Date  ? Chronic back pain   ? Diabetes mellitus   ? Edema leg   ? Hyperlipidemia   ? Hypertension   ? Joint pain   ? Kidney stones   ? Other malaise and fatigue   ? PAF (paroxysmal atrial fibrillation) ([place])   ? a. new diagnosis in 02/2021 --> s/p DCCV on 03/18/2021  ? RBBB   ? Sinus bradycardia   ? Sleep apnea   ? ?Past Surgical History:  ?Procedure Laterality Date  ? CARDIOVERSION N/A 03/18/2021  ? Procedure: CARDIOVERSION;  Surgeon: Satira Sark, MD;  Location: [facility];  Service:  Cardiovascular;  Laterality: N/A;  ? CESAREAN SECTION    ? times 2  ? INCISION AND DRAINAGE ABSCESS Right   ? axilla  ? LITHOTRIPSY    ? ? ?Allergies ? ?No Known Allergies ? ?History of Present Illness  ?  ?JEZEL BASTO is a 61 y.o. female who presents via audio/video conferencing for a telehealth visit today.  Pt was last seen in cardiology clinic on 12/09/2021 by Dr. Percival Spanish.  She had been seen in the ED for recurrent Afib and underwent DCCV.  At that time JAYDAN CHRETIEN was doing reasonably well, without symptoms of angina or decompensation.  The patient is now pending a colonoscopy and EGD.  Since her last visit, she has done well and been without symptoms of angina or decompensation.  No further symptoms concerning for Afib.  She has also received her CPAP and has been using this.  Her chart has been reviewed by our pharmacy team with recommendation for the patient to hold Eliquis for 2 days prior to her procedure.  ? ? ?Home Medications  ?  ?Prior to Admission medications   ?Medication Sig Start Date End Date Taking? Authorizing Provider  ?albuterol (VENTOLIN HFA) 108 (90 Base) MCG/ACT inhaler Inhale 2 puffs into the lungs every 6 (six) hours as needed for wheezing or shortness of breath. 11/20/21   Renee Rival, FNP  ?amLODipine (NORVASC) 10 MG tablet Take 1 tablet (10 mg total) by mouth daily. 03/25/21 01/13/22  Josue Hector, MD  ?apixaban (ELIQUIS) 5 MG TABS tablet TAKE ONE TABLET BY MOUTH TWICE DAILY 09/28/21   Josue Hector, MD  ?blood glucose meter kit and supplies Dispense based on patient and insurance preference. Use up to four times daily as directed. (FOR ICD-10 E10.9, E11.9). 10/01/21   Brita Romp, NP  ?Cholecalciferol (VITAMIN D3) 25 MCG (1000 UT) CAPS Take 1 capsule (1,000 Units total) by mouth daily. 09/24/21   Renee Rival, FNP  ?diclofenac Sodium (VOLTAREN) 1 % GEL Apply 2 g topically 4 (four) times daily. 12/26/21   Volney American, PA-C   ?lisinopril-hydrochlorothiazide (ZESTORETIC) 20-25 MG tablet Take 1 tablet by mouth daily. 12/24/21   Renee Rival, FNP  ?metoprolol succinate (TOPROL-XL) 50 MG 24 hr tablet TAKE ONE TABLET BY MOUTH DAILY. TAKE WITH OR IMMEDIATELY FOLLOWING A MEAL. 11/05/21   Minus Breeding, MD  ?omeprazole (PRILOSEC) 40 MG capsule Take 1 capsule (40 mg total) by mouth daily. 10/19/21   Renee Rival, FNP  ?Plecanatide (TRULANCE) 3 MG TABS Take 3 mg by mouth daily. 01/13/22   Mahala Menghini, PA-C  ?polyethylene glycol powder (GLYCOLAX/MIRALAX) 17 GM/SCOOP powder Take 17 g by mouth daily. 12/29/21   Alvira Monday, [place]  ?Psyllium Fiber 0.52 g CAPS Take 3 capsules by mouth daily at 2 PM. 12/29/21   Alvira Monday, FNP  ?rosuvastatin (CRESTOR) 20 MG tablet Take 1 tablet (20 mg total) by mouth daily. 09/24/21   Renee Rival, FNP  ?tirzepatide Darcel Bayley) 5 MG/0.5ML Pen Inject 5 mg into the skin once a week. 10/01/21   Brita Romp, NP  ?TRUE METRIX BLOOD GLUCOSE TEST test strip SMARTSIG:Via Meter 1-4 Times Daily 10/01/21   [provider]  ?TRUEplus Lancets 30G MISC USE UP TO FOUR TIMES DAILY AS DIRECTED 10/01/21   [provider]  ? ? ?Physical Exam  ?  ?Vital Signs:  JILDA KRESS does not have vital signs available for review today. ? ?Given telephonic nature of communication, physical exam is limited. ?AAOx3. NAD. Normal affect.  Speech and respirations are unlabored. ? ?Accessory Clinical Findings  ?  ?None ? ?Assessment & Plan  ?  ?1.  Preoperative Cardiovascular Risk Assessment: The patient affirms she has been doing well without any new cardiac symptoms. They are able to achieve > 4 METS without cardiac limitations. RCRI: low risk for noncardiac procedure.  She may hold Eliquis for 2 days prior to her procedure with recommendation for the patient to resume this medication as soon as safely possible under the direction of the provider performing her procedure.  Therefore, based on ACC/AHA  guidelines, the patient would be at acceptable risk for the planned procedure without further cardiovascular testing. The patient was advised that if she develops new symptoms prior to surgery to contact our office to arrange for a follow-up visit, and she verbalized understanding. ? ? ? ?A copy of this note will be routed to requesting surgeon. ? ?Time:   ?Today, I have spent 5 minutes with the patient with telehealth technology discussing medical history, symptoms, and management plan.   ? ? ?Christell Faith, PA-C ? ?01/15/2022, 1:45 PM ? ?

## 2022-01-18 NOTE — Telephone Encounter (Signed)
Cardiac clearance is on your desk. Patient has been cleared to hold Eliquis x 2 days as requested.  ?

## 2022-01-19 ENCOUNTER — Telehealth: Payer: Self-pay | Admitting: Nurse Practitioner

## 2022-01-19 MED ORDER — TIRZEPATIDE 7.5 MG/0.5ML ~~LOC~~ SOAJ: 7.5000 mg | SUBCUTANEOUS | 3 refills | Status: DC

## 2022-01-19 NOTE — Telephone Encounter (Signed)
Pt called in regards to her readings as you had mentioned possibly increasing her mounjaro if her readings increased, if so she is due for a refill and would like the new prescription sent in. All readings are fasting in AM. ? ?4/28 154 ? ?4/29 175 ? ?4/30 198 ? ?5/1 160 ? ?5/2 141 ? ?[facility] [place], [place] [employer]. Phone:  [phone]  ?Fax:  [phone]  ?  ? ?

## 2022-01-19 NOTE — Telephone Encounter (Signed)
We will call pt to schedule once we receive that future schedule ?

## 2022-01-19 NOTE — Telephone Encounter (Signed)
Informed patient

## 2022-01-19 NOTE — Telephone Encounter (Signed)
Done, sent in for Mounjaro 7.5 mg SQ weekly to her pharmacy

## 2022-01-19 NOTE — Telephone Encounter (Signed)
Reviewed clearance.  ? ?Please schedule EGD/ED/colonoscopy in the near future.  ASA 3. Dr. Abbey Chatters. Dx: screen colonoscopy, chronic gerd, dysphagia. ? ?Hold eliquis 48 hours before procedure.  ?Hold monjaro one week before.  ?Make sure bowels better on Trulance. Should be taking daily especially the week before her procedure only holding for 24 hours if watery stools. ?

## 2022-01-21 ENCOUNTER — Encounter: Payer: Self-pay | Admitting: Nurse Practitioner

## 2022-01-21 ENCOUNTER — Ambulatory Visit (INDEPENDENT_AMBULATORY_CARE_PROVIDER_SITE_OTHER): Payer: 59 | Admitting: Nurse Practitioner

## 2022-01-21 VITALS — BP 127/70 | HR 73 | Temp 98.7°F | Ht 63.0 in | Wt 246.0 lb

## 2022-01-21 DIAGNOSIS — J020 Streptococcal pharyngitis: Secondary | ICD-10-CM

## 2022-01-21 DIAGNOSIS — E038 Other specified hypothyroidism: Secondary | ICD-10-CM | POA: Diagnosis not present

## 2022-01-21 DIAGNOSIS — E1165 Type 2 diabetes mellitus with hyperglycemia: Secondary | ICD-10-CM | POA: Diagnosis not present

## 2022-01-21 DIAGNOSIS — I10 Essential (primary) hypertension: Secondary | ICD-10-CM | POA: Diagnosis not present

## 2022-01-21 DIAGNOSIS — E559 Vitamin D deficiency, unspecified: Secondary | ICD-10-CM | POA: Diagnosis not present

## 2022-01-21 DIAGNOSIS — E66813 Obesity, class 3: Secondary | ICD-10-CM

## 2022-01-21 DIAGNOSIS — N1831 Chronic kidney disease, stage 3a: Secondary | ICD-10-CM

## 2022-01-21 DIAGNOSIS — J029 Acute pharyngitis, unspecified: Secondary | ICD-10-CM | POA: Insufficient documentation

## 2022-01-21 DIAGNOSIS — E782 Mixed hyperlipidemia: Secondary | ICD-10-CM

## 2022-01-21 DIAGNOSIS — R051 Acute cough: Secondary | ICD-10-CM

## 2022-01-21 LAB — POCT RAPID STREP A (OFFICE): Rapid Strep A Screen: POSITIVE — AB

## 2022-01-21 MED ORDER — BENZONATATE 100 MG PO CAPS: 100.0000 mg | ORAL_CAPSULE | Freq: Two times a day (BID) | ORAL | 0 refills | Status: DC | PRN

## 2022-01-21 MED ORDER — PENICILLIN V POTASSIUM 500 MG PO TABS: 500.0000 mg | ORAL_TABLET | Freq: Three times a day (TID) | ORAL | 0 refills | Status: AC

## 2022-01-21 NOTE — Assessment & Plan Note (Signed)
Stable Chronic condition ?Avoid NSAIDs drink at least 64 ounces of water daily ?On lisinopril-hydrochlorothiazide 20-25 mg daily, monjuaro for diabetes ?Lab Results  ?Component Value Date  ? NA 141 12/31/2021  ? K 3.9 12/31/2021  ? CO2 27 12/31/2021  ? GLUCOSE 78 12/31/2021  ? BUN 30 (H) 12/31/2021  ? CREATININE 1.16 (H) 12/31/2021  ? CALCIUM 10.3 12/31/2021  ? EGFR 43 (L) 09/23/2021  ? GFRNONAA 54 (L) 12/31/2021  ? ?

## 2022-01-21 NOTE — Assessment & Plan Note (Signed)
COVID test pending ?Take Tessalon 100 mg twice daily as needed for cough ?Tylenol as needed for body aches and fever ?Drink at least 64 ounces of water daily to stay hydrated ?

## 2022-01-21 NOTE — Progress Notes (Signed)
? ?GLORIANN RIEDE     MRN: [id number]      DOB: 07-31-1961 ? ? ?HPI ?Ms. Fifer with past medical history of A-fib, hypertension, uncontrolled type 2 diabetes, GERD, is here for follow up and re-evaluation of chronic medical conditions,  ? ?Pt c/o sore throat , dry cough ,stuffy nose , facial pressure, chills , body aches that started 5 days ago. Has some slight shortness of breath, denies CP, wheezing, shes up to date with flu vaccine, due for covid booster ? ?Subclinical hypothyroidism. pt c/o constipation, feeling fatigued for the past few month, has history of A-fib and not currently on medication for subclinical hypothyroidism.  Taking Trulance for her constipation. ? ? ?ROS ?Denies recent fever or chills. ?Denies sinus pressure, nasal congestion, ear pain or sore throat. ?Denies chest congestion, productive cough or wheezing. ?Denies chest pains, palpitations and leg swelling ?Denies abdominal pain, nausea, vomiting,diarrhea or constipation.   ?Denies dysuria, frequency, hesitancy or incontinence. ?Denies joint pain, swelling and limitation in mobility. ?Denies headaches, seizures, numbness, or tingling. ? ? ? ? ?PE ? ?BP 127/70 (BP Location: Right Arm, Patient Position: Sitting, Cuff Size: Large)   Pulse 73   Temp 98.7 ?F (37.1 ?C) (Oral)   Ht _0  (1.6 m)   Wt 246 lb (111.6 kg)   SpO2 98%   BMI 43.58 kg/m?  ? ?Patient alert and oriented and in no cardiopulmonary distress. ? ?HEENT: No facial asymmetry, EOMI,     Neck supple .  Throat appears red no exudates or lesion noted ? ?Chest: Clear to auscultation bilaterally. ? ?CVS: S1, S2 no murmurs, no S3.Regular rate. ? ?ABD: Soft non tender.  ? ?Ext: No edema ? ?MS: Adequate ROM spine, shoulders, hips and knees. ? ?Psych: Good eye contact, normal affect. Memory intact not anxious or depressed appearing. ? ?CNS: CN 2-12 intact, power,  normal throughout.no focal deficits noted. ? ? ?Assessment & Plan ? ?Obesity, Class III, BMI 40-49.9 (morbid obesity)  ([place]) ?Wt Readings from Last 3 Encounters:  ?01/21/22 246 lb (111.6 kg)  ?01/13/22 251 lb 3.2 oz (113.9 kg)  ?01/13/22 252 lb (114.3 kg)  ? she has lost 6 pounds in a month ? she has been using a thread climber at home , states that her diet has been better , has cut out sweets , soda tea.  ?Need to increase intake of whole food consisting mainly vegetables and protein less carbohydrate engaging in daily vigorous exercise at least 30 minutes 5 days a week, importance of portion control discussed with patient, patient encouraged to drink at least 64 ounces of water daily she verbalized understanding ?Currently on Mounjaro 7.5 mg weekly injection. ? ?HTN (hypertension) ?BP Readings from Last 3 Encounters:  ?01/21/22 127/70  ?01/13/22 102/67  ?01/13/22 (!) 158/84  ?Blood pressure well controlled on amlodipine 10 mg daily, lisinopril-hydrochlorothiazide 20-25 mg tablet daily, metoprolol 50 mg daily, ?Continue current medications ?DASH diet advised engage in regular vigorous exercises at least 150 minutes weekly ?Lab Results  ?Component Value Date  ? NA 141 12/31/2021  ? K 3.9 12/31/2021  ? CO2 27 12/31/2021  ? GLUCOSE 78 12/31/2021  ? BUN 30 (H) 12/31/2021  ? CREATININE 1.16 (H) 12/31/2021  ? CALCIUM 10.3 12/31/2021  ? EGFR 43 (L) 09/23/2021  ? GFRNONAA 54 (L) 12/31/2021  ? ? ?Uncontrolled type 2 diabetes mellitus with hyperglycemia, without long-term current use of insulin ([place]) ?Lab Results  ?Component Value Date  ? HGBA1C 6.5 01/13/2022  ?Chronic stable condition  on Mounjaro 7.5 mg weekly,  ?Avoid sugar sweets soda ?Appreciate collaboration with endocrinology ?Up-to-date with eye exam, foot exam ? ?Subclinical hypothyroidism ?Has chronic fatigue, constipation ?We will check thyroid function labs today ? ? ?Hyperlipidemia ?on rosuvastatin 20 mg daily ?Check fasting lipid ? ?Acute cough ?COVID test pending ?Take Tessalon 100 mg twice daily as needed for cough ?Tylenol as needed for body aches and fever ?Drink at least  64 ounces of water daily to stay hydrated ? ?Strep pharyngitis ?Positive strep A ?Start penicillin 500 mg 3 times daily for 10 days ?Take Tylenol as needed pain ?Drink warm fluids ?Patient encouraged to practice good hygiene to prevent spreading strep to others ? ?Stage 3a chronic kidney disease ([place]) ?Stable Chronic condition ?Avoid NSAIDs drink at least 64 ounces of water daily ?On lisinopril-hydrochlorothiazide 20-25 mg daily, monjuaro for diabetes ?Lab Results  ?Component Value Date  ? NA 141 12/31/2021  ? K 3.9 12/31/2021  ? CO2 27 12/31/2021  ? GLUCOSE 78 12/31/2021  ? BUN 30 (H) 12/31/2021  ? CREATININE 1.16 (H) 12/31/2021  ? CALCIUM 10.3 12/31/2021  ? EGFR 43 (L) 09/23/2021  ? GFRNONAA 54 (L) 12/31/2021  ? ? ?Vitamin D deficiency ?Currently on vitamin D 1000 units daily ?Check vitamin D levels ? ?  ?

## 2022-01-21 NOTE — Assessment & Plan Note (Signed)
Positive strep A ?Start penicillin 500 mg 3 times daily for 10 days ?Take Tylenol as needed pain ?Drink warm fluids ?Patient encouraged to practice good hygiene to prevent spreading strep to others ?

## 2022-01-21 NOTE — Assessment & Plan Note (Deleted)
Strep test done today ?Take Tylenol 650 mg every 6 hours as needed for pain ?Drink warm fluids ?

## 2022-01-21 NOTE — Assessment & Plan Note (Signed)
BP Readings from Last 3 Encounters:  ?01/21/22 127/70  ?01/13/22 102/67  ?01/13/22 (!) 158/84  ?Blood pressure well controlled on amlodipine 10 mg daily, lisinopril-hydrochlorothiazide 20-25 mg tablet daily, metoprolol 50 mg daily, ?Continue current medications ?DASH diet advised engage in regular vigorous exercises at least 150 minutes weekly ?Lab Results  ?Component Value Date  ? NA 141 12/31/2021  ? K 3.9 12/31/2021  ? CO2 27 12/31/2021  ? GLUCOSE 78 12/31/2021  ? BUN 30 (H) 12/31/2021  ? CREATININE 1.16 (H) 12/31/2021  ? CALCIUM 10.3 12/31/2021  ? EGFR 43 (L) 09/23/2021  ? GFRNONAA 54 (L) 12/31/2021  ? ?

## 2022-01-21 NOTE — Assessment & Plan Note (Signed)
on rosuvastatin 20 mg daily ?Check fasting lipid ?

## 2022-01-21 NOTE — Assessment & Plan Note (Signed)
Has chronic fatigue, constipation ?We will check thyroid function labs today ? ?

## 2022-01-21 NOTE — Assessment & Plan Note (Addendum)
Wt Readings from Last 3 Encounters:  ?01/21/22 246 lb (111.6 kg)  ?01/13/22 251 lb 3.2 oz (113.9 kg)  ?01/13/22 252 lb (114.3 kg)  ? she has lost 6 pounds in a month ? she has been using a thread climber at home , states that her diet has been better , has cut out sweets , soda tea.  ?Need to increase intake of whole food consisting mainly vegetables and protein less carbohydrate engaging in daily vigorous exercise at least 30 minutes 5 days a week, importance of portion control discussed with patient, patient encouraged to drink at least 64 ounces of water daily she verbalized understanding ?Currently on Mounjaro 7.5 mg weekly injection. ?

## 2022-01-21 NOTE — Assessment & Plan Note (Signed)
Currently on vitamin D 1000 units daily ?Check vitamin D levels ? ?

## 2022-01-21 NOTE — Patient Instructions (Addendum)
Please take penicillin  Take 1 tablet (500 mg total) by mouth 3 (three) times daily for 10 days for your strep throat.  ? ?Please take Tessalon 100 mg 2 times daily as needed for your cough, COVID test is pending take Tylenol 650 mg every 6 hours as needed for pain and fever. ? ? ?Pleas get your shingles and TDAP vaccine at your pharmacy  ? ?It is important that you exercise regularly at least 30 minutes 5 times a week.  ?Think about what you will eat, plan ahead. ?Choose " clean, green, fresh or frozen" over canned, processed or packaged foods which are more sugary, salty and fatty. ?70 to 75% of food eaten should be vegetables and fruit. ?Three meals at set times with snacks allowed between meals, but they must be fruit or vegetables. ?Aim to eat over a 12 hour period , example 7 am to 7 pm, and STOP after  your last meal of the day. ?Drink water,generally about 64 ounces per day, no other drink is as healthy. Fruit juice is best enjoyed in a healthy way, by EATING the fruit. ? ?Thanks for choosing [facility], we consider it a privelige to serve you.  ?

## 2022-01-21 NOTE — Assessment & Plan Note (Signed)
Lab Results  ?Component Value Date  ? HGBA1C 6.5 01/13/2022  ?Chronic stable condition on Mounjaro 7.5 mg weekly,  ?Avoid sugar sweets soda ?Appreciate collaboration with endocrinology ?Up-to-date with eye exam, foot exam ?

## 2022-01-23 LAB — NOVEL CORONAVIRUS, NAA: SARS-CoV-2, NAA: NOT DETECTED

## 2022-01-27 ENCOUNTER — Other Ambulatory Visit: Payer: Self-pay

## 2022-01-27 MED ORDER — PEG 3350-KCL-NA BICARB-NACL 420 G PO SOLR: 4000.0000 mL | ORAL | 0 refills | Status: DC

## 2022-01-27 NOTE — Telephone Encounter (Signed)
Pre-op appt 02/19/22. Appt letter mailed with procedure instructions. ?

## 2022-01-27 NOTE — Telephone Encounter (Signed)
Pt called office, TCS/EGD/DIL scheduled for 02/22/22 at 7:30am. Advised pt to hold monjaro one week before procedure and Eliquis for 48 hours before procedure. Her bowels are doing better on Trulance. Informed her of Leslie's recommendation for Trulance. Rx for prep sent to pharmacy. Orders entered. ?

## 2022-01-27 NOTE — Telephone Encounter (Signed)
LMOVM to call back to schedule 

## 2022-02-05 ENCOUNTER — Telehealth: Payer: Self-pay | Admitting: Nurse Practitioner

## 2022-02-05 NOTE — Telephone Encounter (Signed)
Pt called and said her fasting readings are still rising and wants to know if you want to increase her mounjaro again.  5/17 160  5/18 183  5/19 199

## 2022-02-05 NOTE — Telephone Encounter (Signed)
Patient made aware.

## 2022-02-05 NOTE — Telephone Encounter (Signed)
It takes about 6 weeks for [place] to see the maximum benefit of dosage changes with Mounjaro.  I recommend holding out for just a bit longer to see if it is improving.  Reinforce healthy diet with 3 well balanced meals, no snacking, no sugary drinks, and incorporating physical exercise into daily routine.

## 2022-02-17 NOTE — Patient Instructions (Signed)
BERDELL NEVITT  02/17/2022     '@[email or web address]'$ @   Your procedure is scheduled on  02/22/2022.   Report to [facility] at  0600 A.M.   Call this number if you have problems the morning of surgery:  [phone]   Remember:  Follow the diet and prep instructions given to ou by the office.    You should have stopped your monjouro 02/11/2022 and your last dose of eliquis should be 02/19/2022.      DO NOT take any medications for diabetes the mornig of your procedure.    Use your inhaler before you come and bring your rescue inhaler with you.    Take these medicines the morning of surgery with A SIP OF WATER              amlodipine, metoprolol, omeprazole.     Do not wear jewelry, make-up or nail polish.  Do not wear lotions, powders, or perfumes, or deodorant.  Do not shave 48 hours prior to surgery.  Men may shave face and neck.  Do not bring valuables to the hospital.  [facility] is not responsible for any belongings or valuables.  Contacts, dentures or bridgework may not be worn into surgery.  Leave your suitcase in the car.  After surgery it may be brought to your room.  For patients admitted to the hospital, discharge time will be determined by your treatment team.  Patients discharged the day of surgery will not be allowed to drive home and must have someone with them for 24 hours.     Special instructions:   DO NOT smoke tobacco or vape for 24 hours before your procedure.  Please read over the following fact sheets that you were given. Anesthesia Post-op Instructions and Care and Recovery After Surgery      Upper Endoscopy, Adult, Care After This sheet gives you information about how to care for yourself after your procedure. Your health care provider may also give you more specific instructions. If you have problems or questions, contact your health care provider. What can I expect after the procedure? After the procedure, it is common to  have: A sore throat. Mild stomach pain or discomfort. Bloating. Nausea. Follow these instructions at home:  Follow instructions from your health care provider about what to eat or drink after your procedure. Return to your normal activities as told by your health care provider. Ask your health care provider what activities are safe for you. Take over-the-counter and prescription medicines only as told by your health care provider. If you were given a sedative during the procedure, it can affect you for several hours. Do not drive or operate machinery until your health care provider says that it is safe. Keep all follow-up visits as told by your health care provider. This is important. Contact a health care provider if you have: A sore throat that lasts longer than one day. Trouble swallowing. Get help right away if: You vomit blood or your vomit looks like coffee grounds. You have: A fever. Bloody, black, or tarry stools. A severe sore throat or you cannot swallow. Difficulty breathing. Severe pain in your chest or abdomen. Summary After the procedure, it is common to have a sore throat, mild stomach discomfort, bloating, and nausea. If you were given a sedative during the procedure, it can affect you for several hours. Do not drive or operate machinery until your health care provider says that  it is safe. Follow instructions from your health care provider about what to eat or drink after your procedure. Return to your normal activities as told by your health care provider. This information is not intended to replace advice given to you by your health care provider. Make sure you discuss any questions you have with your health care provider. Document Revised: 07/13/2019 Document Reviewed: 02/06/2018 Elsevier Patient Education  [place]. Esophageal Dilatation Esophageal dilatation, also called esophageal dilation, is a procedure to widen or open a blocked or narrowed part of  the esophagus. The esophagus is the part of the body that moves food and liquid from the mouth to the stomach. You may need this procedure if: You have a buildup of scar tissue in your esophagus that makes it difficult, painful, or impossible to swallow. This can be caused by gastroesophageal reflux disease (GERD). You have cancer of the esophagus. There is a problem with how food moves through your esophagus. In some cases, you may need this procedure repeated at a later time to dilate the esophagus gradually. Tell a health care provider about: Any allergies you have. All medicines you are taking, including vitamins, herbs, eye drops, creams, and over-the-counter medicines. Any problems you or family members have had with anesthetic medicines. Any blood disorders you have. Any surgeries you have had. Any medical conditions you have. Any antibiotic medicines you are required to take before dental procedures. Whether you are pregnant or may be pregnant. What are the risks? Generally, this is a safe procedure. However, problems may occur, including: Bleeding due to a tear in the lining of the esophagus. A hole, or perforation, in the esophagus. What happens before the procedure? Ask your health care provider about: Changing or stopping your regular medicines. This is especially important if you are taking diabetes medicines or blood thinners. Taking medicines such as aspirin and ibuprofen. These medicines can thin your blood. Do not take these medicines unless your health care provider tells you to take them. Taking over-the-counter medicines, vitamins, herbs, and supplements. Follow instructions from your health care provider about eating or drinking restrictions. Plan to have a responsible adult take you home from the hospital or clinic. Plan to have a responsible adult care for you for the time you are told after you leave the hospital or clinic. This is important. What happens during the  procedure? You may be given a medicine to help you relax (sedative). A numbing medicine may be sprayed into the back of your throat, or you may gargle the medicine. Your health care provider may perform the dilatation using various surgical instruments, such as: Simple dilators. This instrument is carefully placed in the esophagus to stretch it. Guided wire bougies. This involves using an endoscope to insert a wire into the esophagus. A dilator is passed over this wire to enlarge the esophagus. Then the wire is removed. Balloon dilators. An endoscope with a small balloon is inserted into the esophagus. The balloon is inflated to stretch the esophagus and open it up. The procedure may vary among health care providers and hospitals. What can I expect after the procedure? Your blood pressure, heart rate, breathing rate, and blood oxygen level will be monitored until you leave the hospital or clinic. Your throat may feel slightly sore and numb. This will get better over time. You will not be allowed to eat or drink until your throat is no longer numb. When you are able to drink, urinate, and sit on the  edge of the bed without nausea or dizziness, you may be able to return home. Follow these instructions at home: Take over-the-counter and prescription medicines only as told by your health care provider. If you were given a sedative during the procedure, it can affect you for several hours. Do not drive or operate machinery until your health care provider says that it is safe. Plan to have a responsible adult care for you for the time you are told. This is important. Follow instructions from your health care provider about any eating or drinking restrictions. Do not use any products that contain nicotine or tobacco, such as cigarettes, e-cigarettes, and chewing tobacco. If you need help quitting, ask your health care provider. Keep all follow-up visits. This is important. Contact a health care provider  if: You have a fever. You have pain that is not relieved by medicine. Get help right away if: You have chest pain. You have trouble breathing. You have trouble swallowing. You vomit blood. You have black, tarry, or bloody stools. These symptoms may represent a serious problem that is an emergency. Do not wait to see if the symptoms will go away. Get medical help right away. Call your local emergency services (911 in the [place] Do not drive yourself to the hospital. Summary Esophageal dilatation, also called esophageal dilation, is a procedure to widen or open a blocked or narrowed part of the esophagus. Plan to have a responsible adult take you home from the hospital or clinic. For this procedure, a numbing medicine may be sprayed into the back of your throat, or you may gargle the medicine. Do not drive or operate machinery until your health care provider says that it is safe. This information is not intended to replace advice given to you by your health care provider. Make sure you discuss any questions you have with your health care provider. Document Revised: 01/23/2020 Document Reviewed: 01/23/2020 Elsevier Patient Education  [place]. Colonoscopy, Adult, Care After The following information offers guidance on how to care for yourself after your procedure. Your health care provider may also give you more specific instructions. If you have problems or questions, contact your health care provider. What can I expect after the procedure? After the procedure, it is common to have: A small amount of blood in your stool for 24 hours after the procedure. Some gas. Mild cramping or bloating of your abdomen. Follow these instructions at home: Eating and drinking  Drink enough fluid to keep your urine pale yellow. Follow instructions from your health care provider about eating or drinking restrictions. Resume your normal diet as told by your health care provider. Avoid heavy or  fried foods that are hard to digest. Activity Rest as told by your health care provider. Avoid sitting for a long time without moving. Get up to take short walks every 1-2 hours. This is important to improve blood flow and breathing. Ask for help if you feel weak or unsteady. Return to your normal activities as told by your health care provider. Ask your health care provider what activities are safe for you. Managing cramping and bloating  Try walking around when you have cramps or feel bloated. If directed, apply heat to your abdomen as told by your health care provider. Use the heat source that your health care provider recommends, such as a moist heat pack or a heating pad. Place a towel between your skin and the heat source. Leave the heat on for 20-30 minutes. Remove the  heat if your skin turns bright red. This is especially important if you are unable to feel pain, heat, or cold. You have a greater risk of getting burned. General instructions If you were given a sedative during the procedure, it can affect you for several hours. Do not drive or operate machinery until your health care provider says that it is safe. For the first 24 hours after the procedure: Do not sign important documents. Do not drink alcohol. Do your regular daily activities at a slower pace than normal. Eat soft foods that are easy to digest. Take over-the-counter and prescription medicines only as told by your health care provider. Keep all follow-up visits. This is important. Contact a health care provider if: You have blood in your stool 2-3 days after the procedure. Get help right away if: You have more than a small spotting of blood in your stool. You have large blood clots in your stool. You have swelling of your abdomen. You have nausea or vomiting. You have a fever. You have increasing pain in your abdomen that is not relieved with medicine. These symptoms may be an emergency. Get help right away. Call  911. Do not wait to see if the symptoms will go away. Do not drive yourself to the hospital. Summary After the procedure, it is common to have a small amount of blood in your stool. You may also have mild cramping and bloating of your abdomen. If you were given a sedative during the procedure, it can affect you for several hours. Do not drive or operate machinery until your health care provider says that it is safe. Get help right away if you have a lot of blood in your stool, nausea or vomiting, a fever, or increased pain in your abdomen. This information is not intended to replace advice given to you by your health care provider. Make sure you discuss any questions you have with your health care provider. Document Revised: 04/29/2021 Document Reviewed: 04/29/2021 Elsevier Patient Education  [place] After This sheet gives you information about how to care for yourself after your procedure. Your health care provider may also give you more specific instructions. If you have problems or questions, contact your health care provider. What can I expect after the procedure? After the procedure, it is common to have: Tiredness. Forgetfulness about what happened after the procedure. Impaired judgment for important decisions. Nausea or vomiting. Some difficulty with balance. Follow these instructions at home: For the time period you were told by your health care provider:     Rest as needed. Do not participate in activities where you could fall or become injured. Do not drive or use machinery. Do not drink alcohol. Do not take sleeping pills or medicines that cause drowsiness. Do not make important decisions or sign legal documents. Do not take care of children on your own. Eating and drinking Follow the diet that is recommended by your health care provider. Drink enough fluid to keep your urine pale yellow. If you vomit: Drink water, juice, or  soup when you can drink without vomiting. Make sure you have little or no nausea before eating solid foods. General instructions Have a responsible adult stay with you for the time you are told. It is important to have someone help care for you until you are awake and alert. Take over-the-counter and prescription medicines only as told by your health care provider. If you have sleep apnea, surgery and certain medicines  can increase your risk for breathing problems. Follow instructions from your health care provider about wearing your sleep device: Anytime you are sleeping, including during daytime naps. While taking prescription pain medicines, sleeping medicines, or medicines that make you drowsy. Avoid smoking. Keep all follow-up visits as told by your health care provider. This is important. Contact a health care provider if: You keep feeling nauseous or you keep vomiting. You feel light-headed. You are still sleepy or having trouble with balance after 24 hours. You develop a rash. You have a fever. You have redness or swelling around the IV site. Get help right away if: You have trouble breathing. You have new-onset confusion at home. Summary For several hours after your procedure, you may feel tired. You may also be forgetful and have poor judgment. Have a responsible adult stay with you for the time you are told. It is important to have someone help care for you until you are awake and alert. Rest as told. Do not drive or operate machinery. Do not drink alcohol or take sleeping pills. Get help right away if you have trouble breathing, or if you suddenly become confused. This information is not intended to replace advice given to you by your health care provider. Make sure you discuss any questions you have with your health care provider. Document Revised: 08/11/2021 Document Reviewed: 08/09/2019 Elsevier Patient Education  [place].

## 2022-02-18 ENCOUNTER — Encounter (HOSPITAL_COMMUNITY): Payer: Self-pay | Admitting: *Deleted

## 2022-02-19 ENCOUNTER — Encounter (HOSPITAL_COMMUNITY)
Admission: RE | Admit: 2022-02-19 | Discharge: 2022-02-19 | Disposition: A | Discharge status: Home / Self Care | Payer: 59 | Source: Ambulatory Visit | Attending: Internal Medicine | Admitting: Internal Medicine

## 2022-02-22 ENCOUNTER — Encounter (HOSPITAL_COMMUNITY): Payer: Self-pay | Admitting: *Deleted

## 2022-02-22 ENCOUNTER — Ambulatory Visit (HOSPITAL_BASED_OUTPATIENT_CLINIC_OR_DEPARTMENT_OTHER): Payer: 59 | Admitting: Anesthesiology

## 2022-02-22 ENCOUNTER — Ambulatory Visit (HOSPITAL_COMMUNITY)
Admission: RE | Admit: 2022-02-22 | Discharge: 2022-02-22 | Disposition: A | Discharge status: Other Acute Inpatient Hospital | Payer: 59 | Attending: Internal Medicine | Admitting: Internal Medicine

## 2022-02-22 ENCOUNTER — Encounter (HOSPITAL_COMMUNITY)
Admission: RE | Disposition: A | Discharge status: Other Acute Inpatient Hospital | Payer: Self-pay | Source: Home / Self Care | Attending: Internal Medicine

## 2022-02-22 ENCOUNTER — Ambulatory Visit (HOSPITAL_COMMUNITY): Payer: 59 | Admitting: Anesthesiology

## 2022-02-22 ENCOUNTER — Other Ambulatory Visit: Payer: Self-pay | Admitting: Nurse Practitioner

## 2022-02-22 DIAGNOSIS — K222 Esophageal obstruction: Secondary | ICD-10-CM

## 2022-02-22 DIAGNOSIS — I480 Paroxysmal atrial fibrillation: Secondary | ICD-10-CM | POA: Diagnosis not present

## 2022-02-22 DIAGNOSIS — Z6841 Body Mass Index (BMI) 40.0 and over, adult: Secondary | ICD-10-CM | POA: Insufficient documentation

## 2022-02-22 DIAGNOSIS — I10 Essential (primary) hypertension: Secondary | ICD-10-CM | POA: Diagnosis not present

## 2022-02-22 DIAGNOSIS — K219 Gastro-esophageal reflux disease without esophagitis: Secondary | ICD-10-CM

## 2022-02-22 DIAGNOSIS — K5730 Diverticulosis of large intestine without perforation or abscess without bleeding: Secondary | ICD-10-CM | POA: Insufficient documentation

## 2022-02-22 DIAGNOSIS — K648 Other hemorrhoids: Secondary | ICD-10-CM

## 2022-02-22 DIAGNOSIS — K635 Polyp of colon: Secondary | ICD-10-CM | POA: Diagnosis not present

## 2022-02-22 DIAGNOSIS — D175 Benign lipomatous neoplasm of intra-abdominal organs: Secondary | ICD-10-CM | POA: Diagnosis not present

## 2022-02-22 DIAGNOSIS — G4730 Sleep apnea, unspecified: Secondary | ICD-10-CM | POA: Insufficient documentation

## 2022-02-22 DIAGNOSIS — K2950 Unspecified chronic gastritis without bleeding: Secondary | ICD-10-CM | POA: Diagnosis not present

## 2022-02-22 DIAGNOSIS — K2970 Gastritis, unspecified, without bleeding: Secondary | ICD-10-CM

## 2022-02-22 DIAGNOSIS — Z7985 Long-term (current) use of injectable non-insulin antidiabetic drugs: Secondary | ICD-10-CM | POA: Diagnosis not present

## 2022-02-22 DIAGNOSIS — E119 Type 2 diabetes mellitus without complications: Secondary | ICD-10-CM | POA: Diagnosis not present

## 2022-02-22 DIAGNOSIS — Z1211 Encounter for screening for malignant neoplasm of colon: Secondary | ICD-10-CM

## 2022-02-22 DIAGNOSIS — R1310 Dysphagia, unspecified: Secondary | ICD-10-CM | POA: Diagnosis not present

## 2022-02-22 DIAGNOSIS — R12 Heartburn: Secondary | ICD-10-CM | POA: Diagnosis not present

## 2022-02-22 HISTORY — PX: BALLOON DILATION: SHX5330

## 2022-02-22 HISTORY — PX: COLONOSCOPY WITH PROPOFOL: SHX5780

## 2022-02-22 HISTORY — PX: BIOPSY: SHX5522

## 2022-02-22 HISTORY — PX: ESOPHAGOGASTRODUODENOSCOPY (EGD) WITH PROPOFOL: SHX5813

## 2022-02-22 HISTORY — PX: POLYPECTOMY: SHX5525

## 2022-02-22 LAB — GLUCOSE, CAPILLARY: Glucose-Capillary: 117 mg/dL — ABNORMAL HIGH (ref 70–99)

## 2022-02-22 SURGERY — COLONOSCOPY WITH PROPOFOL
Anesthesia: General

## 2022-02-22 MED ORDER — LACTATED RINGERS IV SOLN: INTRAVENOUS | Status: DC

## 2022-02-22 MED ORDER — LIDOCAINE HCL (CARDIAC) PF 100 MG/5ML IV SOSY
PREFILLED_SYRINGE | INTRAVENOUS | Status: DC | PRN
  Administered 2022-02-22: 30 mg via INTRATRACHEAL

## 2022-02-22 MED ORDER — PROPOFOL 500 MG/50ML IV EMUL
INTRAVENOUS | Status: DC | PRN
  Administered 2022-02-22: 150 ug/kg/min via INTRAVENOUS

## 2022-02-22 MED ORDER — LACTATED RINGERS IV SOLN: INTRAVENOUS | Status: DC | PRN

## 2022-02-22 MED ORDER — PROPOFOL 10 MG/ML IV BOLUS
INTRAVENOUS | Status: DC | PRN
  Administered 2022-02-22: 20 mg via INTRAVENOUS
  Administered 2022-02-22: 100 mg via INTRAVENOUS
  Administered 2022-02-22 (×2): 50 mg via INTRAVENOUS

## 2022-02-22 NOTE — Anesthesia Preprocedure Evaluation (Signed)
Anesthesia Evaluation  Patient identified by MRN, date of birth, ID band Patient awake    Reviewed: Allergy & Precautions, H&P , NPO status , Patient's Chart, lab work & pertinent test results, reviewed documented beta blocker date and time   Airway Mallampati: II  TM Distance: >3 FB Neck ROM: full    Dental no notable dental hx.    Pulmonary sleep apnea ,    Pulmonary exam normal breath sounds clear to auscultation       Cardiovascular Exercise Tolerance: Good hypertension, + dysrhythmias Atrial Fibrillation  Rhythm:regular Rate:Normal     Neuro/Psych negative neurological ROS  negative psych ROS   GI/Hepatic Neg liver ROS, GERD  Medicated,  Endo/Other  diabetesHypothyroidism Morbid obesity  Renal/GU CRFRenal disease  negative genitourinary   Musculoskeletal   Abdominal   Peds  Hematology negative hematology ROS (+)   Anesthesia Other Findings   Reproductive/Obstetrics negative OB ROS                             Anesthesia Physical Anesthesia Plan  ASA: 3  Anesthesia Plan: General   Post-op Pain Management:    Induction:   PONV Risk Score and Plan: Propofol infusion  Airway Management Planned:   Additional Equipment:   Intra-op Plan:   Post-operative Plan:   Informed Consent: I have reviewed the patients History and Physical, chart, labs and discussed the procedure including the risks, benefits and alternatives for the proposed anesthesia with the patient or authorized representative who has indicated his/her understanding and acceptance.     Dental Advisory Given  Plan Discussed with: CRNA  Anesthesia Plan Comments:         Anesthesia Quick Evaluation

## 2022-02-22 NOTE — Transfer of Care (Signed)
Immediate Anesthesia Transfer of Care Note  Patient: Kathryn Merritt  Procedure(s) Performed: COLONOSCOPY WITH PROPOFOL ESOPHAGOGASTRODUODENOSCOPY (EGD) WITH PROPOFOL BALLOON DILATION BIOPSY POLYPECTOMY  Patient Location: Short Stay  Anesthesia Type:General  Level of Consciousness: sedated  Airway & Oxygen Therapy: Patient Spontanous Breathing  Post-op Assessment: Report given to RN and Post -op Vital signs reviewed and stable  Post vital signs: Reviewed and stable  Last Vitals:  Vitals Value Taken Time  BP 109/64 02/22/22 0815  Temp 36.4 C 02/22/22 0815  Pulse 68 02/22/22 0815  Resp 14 02/22/22 0815  SpO2 100 % 02/22/22 0815    Last Pain:  Vitals:   02/22/22 0815  TempSrc: Oral  PainSc: 0-No pain      Patients Stated Pain Goal: 5 (07/06/50 0258)  Complications: No notable events documented.

## 2022-02-22 NOTE — Discharge Instructions (Addendum)
EGD Discharge instructions Please read the instructions outlined below and refer to this sheet in the next few weeks. These discharge instructions provide you with general information on caring for yourself after you leave the hospital. Your doctor may also give you specific instructions. While your treatment has been planned according to the most current medical practices available, unavoidable complications occasionally occur. If you have any problems or questions after discharge, please call your doctor. ACTIVITY You may resume your regular activity but move at a slower pace for the next 24 hours.  Take frequent rest periods for the next 24 hours.  Walking will help expel (get rid of) the air and reduce the bloated feeling in your abdomen.  No driving for 24 hours (because of the anesthesia (medicine) used during the test).  You may shower.  Do not sign any important legal documents or operate any machinery for 24 hours (because of the anesthesia used during the test).  NUTRITION Drink plenty of fluids.  You may resume your normal diet.  Begin with a light meal and progress to your normal diet.  Avoid alcoholic beverages for 24 hours or as instructed by your caregiver.  MEDICATIONS You may resume your normal medications unless your caregiver tells you otherwise.  WHAT YOU CAN EXPECT TODAY You may experience abdominal discomfort such as a feeling of fullness or "gas" pains.  FOLLOW-UP Your doctor will discuss the results of your test with you.  SEEK IMMEDIATE MEDICAL ATTENTION IF ANY OF THE FOLLOWING OCCUR: Excessive nausea (feeling sick to your stomach) and/or vomiting.  Severe abdominal pain and distention (swelling).  Trouble swallowing.  Temperature over 101 F (37.8 C).  Rectal bleeding or vomiting of blood.     Colonoscopy Discharge Instructions  Read the instructions outlined below and refer to this sheet in the next few weeks. These discharge instructions provide you with  general information on caring for yourself after you leave the hospital. Your doctor may also give you specific instructions. While your treatment has been planned according to the most current medical practices available, unavoidable complications occasionally occur.   ACTIVITY You may resume your regular activity, but move at a slower pace for the next 24 hours.  Take frequent rest periods for the next 24 hours.  Walking will help get rid of the air and reduce the bloated feeling in your belly (abdomen).  No driving for 24 hours (because of the medicine (anesthesia) used during the test).   Do not sign any important legal documents or operate any machinery for 24 hours (because of the anesthesia used during the test).  NUTRITION Drink plenty of fluids.  You may resume your normal diet as instructed by your doctor.  Begin with a light meal and progress to your normal diet. Heavy or fried foods are harder to digest and may make you feel sick to your stomach (nauseated).  Avoid alcoholic beverages for 24 hours or as instructed.  MEDICATIONS You may resume your normal medications unless your doctor tells you otherwise.  WHAT YOU CAN EXPECT TODAY Some feelings of bloating in the abdomen.  Passage of more gas than usual.  Spotting of blood in your stool or on the toilet paper.  IF YOU HAD POLYPS REMOVED DURING THE COLONOSCOPY: No aspirin products for 7 days or as instructed.  No alcohol for 7 days or as instructed.  Eat a soft diet for the next 24 hours.  FINDING OUT THE RESULTS OF YOUR TEST Not all test results are  available during your visit. If your test results are not back during the visit, make an appointment with your caregiver to find out the results. Do not assume everything is normal if you have not heard from your caregiver or the medical facility. It is important for you to follow up on all of your test results.  SEEK IMMEDIATE MEDICAL ATTENTION IF: You have more than a spotting of  blood in your stool.  Your belly is swollen (abdominal distention).  You are nauseated or vomiting.  You have a temperature over 101.  You have abdominal pain or discomfort that is severe or gets worse throughout the day.   Your EGD revealed mild amount inflammation in your stomach.  I took biopsies of this to rule out infection with a bacteria called H. pylori.  Await pathology results, my office will contact you. You also had a slight tightening of your esophagus which I stretched which a balloon today. Hopefully this helps with your swallowing.   Your colonoscopy revealed 9 polyp(s) which I removed successfully. Await pathology results, my office will contact you. I recommend repeating colonoscopy in 3 years for surveillance purposes. You also have diverticulosis and internal hemorrhoids. I would recommend increasing fiber in your diet or adding OTC Benefiber/Metamucil. Be sure to drink at least 4 to 6 glasses of water daily.   Follow-up with GI in 6 months.     I hope you have a great rest of your week!  Elon Alas. Abbey Chatters, D.O. Gastroenterology and Hepatology [facility] PATIENT INSTRUCTIONS POST-ANESTHESIA  IMMEDIATELY FOLLOWING SURGERY:  Do not drive or operate machinery for the first twenty four hours after surgery.  Do not make any important decisions for twenty four hours after surgery or while taking narcotic pain medications or sedatives.  If you develop intractable nausea and vomiting or a severe headache please notify your doctor immediately.  FOLLOW-UP:  Please make an appointment with your surgeon as instructed. You do not need to follow up with anesthesia unless specifically instructed to do so.  WOUND CARE INSTRUCTIONS (if applicable):  Keep a dry clean dressing on the anesthesia/puncture wound site if there is drainage.  Once the wound has quit draining you may leave it open to air.  Generally you should leave the bandage intact for twenty four  hours unless there is drainage.  If the epidural site drains for more than 36-48 hours please call the anesthesia department.  QUESTIONS?:  Please feel free to call your physician or the hospital operator if you have any questions, and they will be happy to assist you.

## 2022-02-22 NOTE — Op Note (Signed)
[facility] Patient Name: Kathryn Merritt Procedure Date: 02/22/2022 7:02 AM MRN: [id number] Date of Birth: 1961-04-12 Attending MD: Elon Alas. Abbey Chatters DO CSN: [id number] Age: 61 Admit Type: Outpatient Procedure:                Upper GI endoscopy Indications:              Dysphagia, Heartburn Providers:                Elon Alas. Abbey Chatters, DO, Caprice Kluver, Raphael Gibney,                            Technician Referring MD:             Elon Alas. Abbey Chatters, DO Medicines:                See the Anesthesia note for documentation of the                            administered medications Complications:            No immediate complications. Estimated Blood Loss:     Estimated blood loss was minimal. Procedure:                Pre-Anesthesia Assessment:                           - The anesthesia plan was to use monitored                            anesthesia care (MAC).                           After obtaining informed consent, the endoscope was                            passed under direct vision. Throughout the                            procedure, the patient's blood pressure, pulse, and                            oxygen saturations were monitored continuously. The                            GIF-H190 ([id number]) scope was introduced through the                            mouth, and advanced to the second part of duodenum.                            The upper GI endoscopy was accomplished without                            difficulty. The patient tolerated the procedure                            well. Scope In: 7:43:46  AM Scope Out: 7:49:58 AM Total Procedure Duration: 0 hours 6 minutes 12 seconds  Findings:      A mild Schatzki ring was found in the distal esophagus. A TTS dilator       was passed through the scope. Dilation with an 18-19-20 mm balloon       dilator was performed to 20 mm. The dilation site was examined and       showed moderate improvement in luminal narrowing. Ring was  then further       disrupted with cold forceps.      The Z-line was regular and was found 38 cm from the incisors.      Localized mild inflammation characterized by erythema was found in the       gastric body. Biopsies were taken with a cold forceps for Helicobacter       pylori testing.      The duodenal bulb, first portion of the duodenum and second portion of       the duodenum were normal. Impression:               - Mild Schatzki ring. Dilated.                           - Z-line regular, 38 cm from the incisors.                           - Gastritis. Biopsied.                           - Normal duodenal bulb, first portion of the                            duodenum and second portion of the duodenum. Moderate Sedation:      Per Anesthesia Care Recommendation:           - Patient has a contact number available for                            emergencies. The signs and symptoms of potential                            delayed complications were discussed with the                            patient. Return to normal activities tomorrow.                            Written discharge instructions were provided to the                            patient.                           - Resume previous diet.                           - Continue present medications.                           -  Await pathology results.                           - Repeat upper endoscopy PRN for retreatment.                           - Return to GI clinic in 6 months. Procedure Code(s):        --- Professional ---                           [phone], Esophagogastroduodenoscopy, flexible,                            transoral; with transendoscopic balloon dilation of                            esophagus (less than 30 mm diameter)                           43239, 59, Esophagogastroduodenoscopy, flexible,                            transoral; with biopsy, single or multiple Diagnosis Code(s):        --- Professional ---                            K22.2, Esophageal obstruction                           K29.70, Gastritis, unspecified, without bleeding                           R13.10, Dysphagia, unspecified                           R12, Heartburn CPT copyright 2019 American Medical Association. All rights reserved. The codes documented in this report are preliminary and upon coder review may  be revised to meet current compliance requirements. Elon Alas. Abbey Chatters, DO [place] Abbey Chatters, DO 02/22/2022 8:12:35 AM This report has been signed electronically. Number of Addenda: 0

## 2022-02-22 NOTE — Op Note (Signed)
[facility] Patient Name: Kathryn Merritt Procedure Date: 02/22/2022 7:02 AM MRN: [id number] Date of Birth: 1961/02/06 Attending MD: Elon Alas. Abbey Chatters DO CSN: [id number] Age: 61 Admit Type: Outpatient Procedure:                Colonoscopy Indications:              Screening for colorectal malignant neoplasm Providers:                Elon Alas. Abbey Chatters, DO, Caprice Kluver, Raphael Gibney,                            Technician Referring MD:             Elon Alas. Abbey Chatters, DO Medicines:                See the Anesthesia note for documentation of the                            administered medications Complications:            No immediate complications. Estimated Blood Loss:     Estimated blood loss was minimal. Procedure:                Pre-Anesthesia Assessment:                           - The anesthesia plan was to use monitored                            anesthesia care (MAC).                           After obtaining informed consent, the colonoscope                            was passed under direct vision. Throughout the                            procedure, the patient's blood pressure, pulse, and                            oxygen saturations were monitored continuously. The                            PCF-HQ190L ([id number]) scope was introduced through                            the anus and advanced to the the cecum, identified                            by appendiceal orifice and ileocecal valve. The                            colonoscopy was performed without difficulty. The                            patient tolerated the procedure  well. The quality                            of the bowel preparation was evaluated using the                            BBPS [facility] Bowel Preparation Scale) with scores                            of: Right Colon = 3, Transverse Colon = 3 and Left                            Colon = 3 (entire mucosa seen well with no residual                             staining, small fragments of stool or opaque                            liquid). The total BBPS score equals 9. Scope In: 7:54:19 AM Scope Out: 8:11:11 AM Scope Withdrawal Time: 0 hours 15 minutes 5 seconds  Total Procedure Duration: 0 hours 16 minutes 52 seconds  Findings:      The perianal and digital rectal examinations were normal.      Non-bleeding internal hemorrhoids were found during endoscopy.      Multiple small and large-mouthed diverticula were found in the sigmoid       colon.      A 7 mm polyp was found in the ascending colon. The polyp was sessile.       The polyp was removed with a cold snare. Resection and retrieval were       complete.      Eight sessile polyps were found in the descending colon and transverse       colon. The polyps were 3 to 6 mm in size. These polyps were removed with       a cold snare. Resection and retrieval were complete.      There was a small lipoma, at the hepatic flexure. Impression:               - Non-bleeding internal hemorrhoids.                           - Diverticulosis in the sigmoid colon.                           - One 7 mm polyp in the ascending colon, removed                            with a cold snare. Resected and retrieved.                           - Eight 3 to 6 mm polyps in the descending colon                            and in the transverse colon, removed with a cold  snare. Resected and retrieved.                           - The examination was otherwise normal. Moderate Sedation:      Per Anesthesia Care Recommendation:           - Patient has a contact number available for                            emergencies. The signs and symptoms of potential                            delayed complications were discussed with the                            patient. Return to normal activities tomorrow.                            Written discharge instructions were provided to the                             patient.                           - Resume previous diet.                           - Continue present medications.                           - Await pathology results.                           - Repeat colonoscopy in 3 years for surveillance.                           - Return to GI clinic in 6 months. Procedure Code(s):        --- Professional ---                           [phone], Colonoscopy, flexible; with removal of                            tumor(s), polyp(s), or other lesion(s) by snare                            technique Diagnosis Code(s):        --- Professional ---                           K63.5, Polyp of colon                           Z12.11, Encounter for screening for malignant                            neoplasm of colon  K64.8, Other hemorrhoids                           K57.30, Diverticulosis of large intestine without                            perforation or abscess without bleeding CPT copyright 2019 American Medical Association. All rights reserved. The codes documented in this report are preliminary and upon coder review may  be revised to meet current compliance requirements. Elon Alas. Abbey Chatters, DO [place], DO 02/22/2022 8:14:30 AM This report has been signed electronically. Number of Addenda: 0

## 2022-02-22 NOTE — H&P (Signed)
Primary Care Physician:  Carver, Charles K, DO Primary Gastroenterologist:  Dr. Carver  Pre-Procedure History & Physical: HPI:  Kathryn Merritt is a 60 y.o. female is here for an EGD with possible dilation for GERD/dysphagia and colonoscopy to be performed for colon cancer screening purposes.  Past Medical History:  Diagnosis Date   Chronic back pain    Diabetes mellitus    Edema leg    Hyperlipidemia    Hypertension    Joint pain    Kidney stones    Other malaise and fatigue    PAF (paroxysmal atrial fibrillation) (HCC)    a. new diagnosis in 02/2021 --> s/p DCCV on 03/18/2021   RBBB    Sinus bradycardia    Sleep apnea     Past Surgical History:  Procedure Laterality Date   CARDIOVERSION N/A 03/18/2021   Procedure: CARDIOVERSION;  Surgeon: McDowell, Samuel G, MD;  Location: [facility];  Service: Cardiovascular;  Laterality: N/A;   CESAREAN SECTION     times 2   INCISION AND DRAINAGE ABSCESS Right    axilla   LITHOTRIPSY      Prior to Admission medications   Medication Sig Start Date End Date Taking? Authorizing Provider  acetaminophen (TYLENOL) 500 MG tablet Take 1,000 mg by mouth every 8 (eight) hours as needed for moderate pain.   Yes [provider]  albuterol (VENTOLIN HFA) 108 (90 Base) MCG/ACT inhaler Inhale 2 puffs into the lungs every 6 (six) hours as needed for wheezing or shortness of breath. 11/20/21  Yes Paseda, Folashade R, FNP  amLODipine (NORVASC) 10 MG tablet Take 10 mg by mouth daily.   Yes [provider]  apixaban (ELIQUIS) 5 MG TABS tablet TAKE ONE TABLET BY MOUTH TWICE DAILY 09/28/21  Yes Nishan, Peter C, MD  Cholecalciferol (VITAMIN D3) 25 MCG (1000 UT) CAPS Take 1 capsule (1,000 Units total) by mouth daily. 09/24/21  Yes Paseda, Folashade R, FNP  lisinopril-hydrochlorothiazide (ZESTORETIC) 20-25 MG tablet Take 1 tablet by mouth daily. 12/24/21  Yes Paseda, Folashade R, FNP  metoprolol succinate (TOPROL-XL) 50 MG 24 hr tablet TAKE ONE TABLET BY  MOUTH DAILY. TAKE WITH OR IMMEDIATELY FOLLOWING A MEAL. 11/05/21  Yes Hochrein, James, MD  omeprazole (PRILOSEC) 40 MG capsule Take 1 capsule (40 mg total) by mouth daily. 10/19/21  Yes Paseda, Folashade R, FNP  Plecanatide (TRULANCE) 3 MG TABS Take 3 mg by mouth daily. 01/13/22  Yes Lewis, Leslie S, PA-C  polyethylene glycol-electrolytes (TRILYTE) 420 g solution Take 4,000 mLs by mouth as directed. 01/27/22  Yes Carver, Charles K, DO  rosuvastatin (CRESTOR) 20 MG tablet Take 1 tablet (20 mg total) by mouth daily. Patient taking differently: Take 20 mg by mouth at bedtime. 09/24/21  Yes Paseda, Folashade R, FNP  tirzepatide (MOUNJARO) 7.5 MG/0.5ML Pen Inject 7.5 mg into the skin once a week. Patient taking differently: Inject 7.5 mg into the skin every Thursday. 01/19/22  Yes Reardon, Whitney J, NP  benzonatate (TESSALON) 100 MG capsule Take 1 capsule (100 mg total) by mouth 2 (two) times daily as needed for cough. Patient not taking: Reported on 02/16/2022 01/21/22   Paseda, Folashade R, FNP  blood glucose meter kit and supplies Dispense based on patient and insurance preference. Use up to four times daily as directed. (FOR ICD-10 E10.9, E11.9). 10/01/21   Reardon, Whitney J, NP  diclofenac Sodium (VOLTAREN) 1 % GEL Apply 2 g topically 4 (four) times daily. Patient not taking: Reported on 02/16/2022 12/26/21     Volney American, PA-C  polyethylene glycol powder [facility]) 17 GM/SCOOP powder Take 17 g by mouth daily. Patient not taking: Reported on 02/16/2022 12/29/21   Alvira Monday, FNP  Psyllium Fiber 0.52 g CAPS Take 3 capsules by mouth daily at 2 PM. Patient not taking: Reported on 02/16/2022 12/29/21   Alvira Monday, FNP  TRUE METRIX BLOOD GLUCOSE TEST test strip SMARTSIG:Via Meter 1-4 Times Daily 10/01/21   [provider]  TRUEplus Lancets 30G MISC USE UP TO FOUR TIMES DAILY AS DIRECTED 10/01/21   [provider]    Allergies as of 01/27/2022   (No Known Allergies)     Family History  Problem Relation Age of Onset   Hypertension Mother    Cancer Father    Hypertension Father    Cancer Brother    Heart disease Paternal Grandmother    Breast cancer Neg Hx    Colon cancer Neg Hx     Social History   Socioeconomic History   Marital status: Married    Spouse name: Not on file   Number of children: 2   Years of education: Not on file   Highest education level: Not on file  Occupational History   Occupation: Occupational psychologist- Teacher  Tobacco Use   Smoking status: Never   Smokeless tobacco: Never  Vaping Use   Vaping Use: Never used  Substance and Sexual Activity   Alcohol use: No   Drug use: No   Sexual activity: Not Currently  Other Topics Concern   Not on file  Social History Narrative   Live with her husband, works at [employer] center,    Social Determinants of Health   Financial Resource Strain: Not on file  Food Insecurity: Not on file  Transportation Needs: Not on file  Physical Activity: Not on file  Stress: Not on file  Social Connections: Not on file  Intimate Partner Violence: Not on file    Review of Systems: See HPI, otherwise negative ROS  Physical Exam: Vital signs in last 24 hours: Temp:  [98.7 F (37.1 C)] 98.7 F (37.1 C) (06/05 0713) Pulse Rate:  [58] 58 (06/05 0713) Resp:  [12] 12 (06/05 0713) SpO2:  [100 %] 100 % (06/05 0713) Weight:  [712 kg] 112 kg (06/05 0713)   General:   Alert,  Well-developed, well-nourished, pleasant and cooperative in NAD Head:  Normocephalic and atraumatic. Eyes:  Sclera clear, no icterus.   Conjunctiva pink. Ears:  Normal auditory acuity. Nose:  No deformity, discharge,  or lesions. Mouth:  No deformity or lesions, dentition normal. Neck:  Supple; no masses or thyromegaly. Lungs:  Clear throughout to auscultation.   No wheezes, crackles, or rhonchi. No acute distress. Heart:  Regular rate and rhythm; no murmurs, clicks, rubs,  or  gallops. Abdomen:  Soft, nontender and nondistended. No masses, hepatosplenomegaly or hernias noted. Normal bowel sounds, without guarding, and without rebound.   Msk:  Symmetrical without gross deformities. Normal posture. Extremities:  Without clubbing or edema. Neurologic:  Alert and  oriented x4;  grossly normal neurologically. Skin:  Intact without significant lesions or rashes. Cervical Nodes:  No significant cervical adenopathy. Psych:  Alert and cooperative. Normal mood and affect.  Impression/Plan: Kathryn Merritt is here for an EGD with possible dilation for GERD/dysphagia and colonoscopy to be performed for colon cancer screening purposes.  The risks of the procedure including infection, bleed, or perforation as well as benefits, limitations, alternatives and imponderables have been reviewed  with the patient. Questions have been answered. All parties agreeable.  

## 2022-02-23 LAB — SURGICAL PATHOLOGY

## 2022-02-23 NOTE — Anesthesia Postprocedure Evaluation (Signed)
Anesthesia Post Note  Patient: Kathryn Merritt  Procedure(s) Performed: COLONOSCOPY WITH PROPOFOL ESOPHAGOGASTRODUODENOSCOPY (EGD) WITH PROPOFOL BALLOON DILATION BIOPSY POLYPECTOMY  Patient location during evaluation: Phase II Anesthesia Type: General Level of consciousness: awake Pain management: pain level controlled Vital Signs Assessment: post-procedure vital signs reviewed and stable Respiratory status: spontaneous breathing and respiratory function stable Cardiovascular status: blood pressure returned to baseline and stable Postop Assessment: no headache and no apparent nausea or vomiting Anesthetic complications: no Comments: Late entry   No notable events documented.   Last Vitals:  Vitals:   02/22/22 0713 02/22/22 0815  BP:  109/64  Pulse: (!) 58 68  Resp: 12 14  Temp: 37.1 C 36.4 C  SpO2: 100% 100%    Last Pain:  Vitals:   02/22/22 0815  TempSrc: Oral  PainSc: 0-No pain                 Louann Sjogren

## 2022-03-02 ENCOUNTER — Telehealth: Payer: Self-pay | Admitting: Internal Medicine

## 2022-03-02 ENCOUNTER — Encounter (HOSPITAL_COMMUNITY): Payer: Self-pay | Admitting: Internal Medicine

## 2022-03-02 NOTE — Telephone Encounter (Signed)
Returned the pt's call and LMOVM that pt's results have not been sent to me yet. Once they get sent I will call the pt. Dr. Abbey Chatters pt has seen her results but you have not reported them to me yet. Once you do I will call her.

## 2022-03-02 NOTE — Telephone Encounter (Signed)
Pt calling to see if her procedure results are back from last Monday with Dr Abbey Chatters. [phone]

## 2022-03-03 NOTE — Telephone Encounter (Signed)
Please let patient know that all of her polyps were benign, few inflammatory polyps.  Given that she had 9 polyps however, would recommend we repeat colonoscopy in 5 years.  Thank you

## 2022-03-04 NOTE — Telephone Encounter (Signed)
On recall  °

## 2022-03-05 NOTE — Telephone Encounter (Signed)
Pt was made aware and verbalized understanding.  

## 2022-03-05 NOTE — Telephone Encounter (Signed)
Lmom for pt to return my call.  

## 2022-03-20 ENCOUNTER — Ambulatory Visit
Admission: EM | Admit: 2022-03-20 | Discharge: 2022-03-20 | Disposition: A | Discharge status: Home / Self Care | Payer: 59 | Attending: Family Medicine | Admitting: Family Medicine

## 2022-03-20 DIAGNOSIS — J069 Acute upper respiratory infection, unspecified: Secondary | ICD-10-CM | POA: Diagnosis not present

## 2022-03-20 MED ORDER — LIDOCAINE VISCOUS HCL 2 % MT SOLN: 10.0000 mL | OROMUCOSAL | 0 refills | Status: DC | PRN

## 2022-03-20 MED ORDER — PROMETHAZINE-DM 6.25-15 MG/5ML PO SYRP: 5.0000 mL | ORAL_SOLUTION | Freq: Four times a day (QID) | ORAL | 0 refills | Status: DC | PRN

## 2022-03-20 NOTE — ED Triage Notes (Signed)
Pt reports sore throat, cough and congestion x 3 days. States throat feels like sand paper. OTC cough syrup gives no relief.

## 2022-03-20 NOTE — ED Provider Notes (Signed)
[facility]    CSN: [id number] Arrival date & time: 03/20/22  1126      History   Chief Complaint Chief Complaint  Patient presents with   Sore Throat   Cough   Nasal Congestion         HPI Kathryn Merritt is a 61 y.o. female.   Today with 3-day history of sore throat, cough, congestion, body aches and chills denies chest pain, shortness of breath, abdominal pain, nausea vomiting or diarrhea.  So far trying Coricidin HBP with minimal relief.  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  No known history of chronic pulmonary conditions.   Past Medical History:  Diagnosis Date   Chronic back pain    Diabetes mellitus    Edema leg    Hyperlipidemia    Hypertension    Joint pain    Kidney stones    Other malaise and fatigue    PAF (paroxysmal atrial fibrillation) ([place])    a. new diagnosis in 02/2021 --> s/p DCCV on 03/18/2021   RBBB    Sinus bradycardia    Sleep apnea    Patient Active Problem List   Diagnosis Date Noted   Vitamin D deficiency 01/21/2022   Acute cough 01/21/2022   Sore throat 01/21/2022   Stage 3a chronic kidney disease ([place]) 01/21/2022   Strep pharyngitis 01/21/2022   Constipation 01/13/2022   Esophageal dysphagia 01/13/2022   Encounter for screening colonoscopy 01/13/2022   Abdominal pain 10/23/2021   Urinary tract infection with hematuria 10/23/2021   Subclinical hypothyroidism 10/23/2021   Annual physical exam 09/23/2021   Bronchitis 09/09/2021   Gastroesophageal reflux disease 07/16/2021   OSA (obstructive sleep apnea) 07/14/2021   Establishing care with new doctor, encounter for 06/18/2021   HTN (hypertension) 06/18/2021   Hyperlipidemia 06/18/2021   Paroxysmal atrial fibrillation ([place]) 03/18/2021   Obesity, Class III, BMI 40-49.9 (morbid obesity) ([place]) 03/18/2021   Uncontrolled type 2 diabetes mellitus with hyperglycemia, without long-term current use of insulin ([place]) 03/18/2021   Past Surgical History:   Procedure Laterality Date   BALLOON DILATION N/A 02/22/2022   Procedure: BALLOON DILATION;  Surgeon: Eloise Harman, DO;  [facility];  Service: Endoscopy;  Laterality: N/A;   BIOPSY  02/22/2022   Procedure: BIOPSY;  Surgeon: Eloise Harman, DO;  [facility];  Service: Endoscopy;;   CARDIOVERSION N/A 03/18/2021   Procedure: CARDIOVERSION;  Surgeon: Satira Sark, MD;  Location: [facility];  Service: Cardiovascular;  Laterality: N/A;   CESAREAN SECTION     times 2   COLONOSCOPY WITH PROPOFOL N/A 02/22/2022   Procedure: COLONOSCOPY WITH PROPOFOL;  Surgeon: Eloise Harman, DO;  [facility];  Service: Endoscopy;  Laterality: N/A;  7:30am   ESOPHAGOGASTRODUODENOSCOPY (EGD) WITH PROPOFOL N/A 02/22/2022   Procedure: ESOPHAGOGASTRODUODENOSCOPY (EGD) WITH PROPOFOL;  Surgeon: Eloise Harman, DO;  [facility];  Service: Endoscopy;  Laterality: N/A;   INCISION AND DRAINAGE ABSCESS Right    axilla   LITHOTRIPSY     POLYPECTOMY  02/22/2022   Procedure: POLYPECTOMY;  Surgeon: Eloise Harman, DO;  [facility];  Service: Endoscopy;;   OB History   No obstetric history on file.    Home Medications    Prior to Admission medications   Medication Sig Start Date End Date Taking? Authorizing Provider  lidocaine (XYLOCAINE) 2 % solution Use as directed 10 mLs in the mouth or throat every 3 (three)  hours as needed for mouth pain. 03/20/22  Yes Volney American, PA-C  promethazine-dextromethorphan (PROMETHAZINE-DM) 6.25-15 MG/5ML syrup Take 5 mLs by mouth 4 (four) times daily as needed. 03/20/22  Yes Volney American, PA-C  acetaminophen (TYLENOL) 500 MG tablet Take 1,000 mg by mouth every 8 (eight) hours as needed for moderate pain.    [provider]  albuterol (VENTOLIN HFA) 108 (90 Base) MCG/ACT inhaler Inhale 2 puffs into the lungs every 6 (six) hours as needed for wheezing or shortness of breath. 11/20/21   Paseda, Dewaine Conger, FNP  amLODipine (NORVASC) 10 MG tablet Take 10 mg by mouth daily.    [provider]  apixaban (ELIQUIS) 5 MG TABS tablet TAKE ONE TABLET BY MOUTH TWICE DAILY 09/28/21   Josue Hector, MD  benzonatate (TESSALON) 100 MG capsule Take 1 capsule (100 mg total) by mouth 2 (two) times daily as needed for cough. Patient not taking: Reported on 02/16/2022 01/21/22   Renee Rival, FNP  blood glucose meter kit and supplies Dispense based on patient and insurance preference. Use up to four times daily as directed. (FOR ICD-10 E10.9, E11.9). 10/01/21   Brita Romp, NP  Cholecalciferol (VITAMIN D3) 25 MCG (1000 UT) CAPS Take 1 capsule (1,000 Units total) by mouth daily. 09/24/21   Renee Rival, FNP  diclofenac Sodium (VOLTAREN) 1 % GEL Apply 2 g topically 4 (four) times daily. Patient not taking: Reported on 02/16/2022 12/26/21   Volney American, PA-C  lisinopril-hydrochlorothiazide (ZESTORETIC) 20-25 MG tablet Take 1 tablet by mouth daily. 12/24/21   Paseda, Dewaine Conger, FNP  metoprolol succinate (TOPROL-XL) 50 MG 24 hr tablet TAKE ONE TABLET BY MOUTH DAILY. TAKE WITH OR IMMEDIATELY FOLLOWING A MEAL. 11/05/21   Minus Breeding, MD  omeprazole (PRILOSEC) 40 MG capsule Take 1 capsule (40 mg total) by mouth daily. 02/22/22   Paseda, Dewaine Conger, FNP  Plecanatide (TRULANCE) 3 MG TABS Take 3 mg by mouth daily. 01/13/22   Mahala Menghini, PA-C  polyethylene glycol powder (GLYCOLAX/MIRALAX) 17 GM/SCOOP powder Take 17 g by mouth daily. Patient not taking: Reported on 02/16/2022 12/29/21   Alvira Monday, FNP  Psyllium Fiber 0.52 g CAPS Take 3 capsules by mouth daily at 2 PM. Patient not taking: Reported on 02/16/2022 12/29/21   Alvira Monday, FNP  rosuvastatin (CRESTOR) 20 MG tablet Take 1 tablet (20 mg total) by mouth daily. Patient taking differently: Take 20 mg by mouth at bedtime. 09/24/21   Paseda, Dewaine Conger, FNP  tirzepatide [facility]) 7.5 MG/0.5ML Pen Inject 7.5 mg into the skin once a  week. Patient taking differently: Inject 7.5 mg into the skin every Thursday. 01/19/22   Brita Romp, NP  TRUE METRIX BLOOD GLUCOSE TEST test strip SMARTSIG:Via Meter 1-4 Times Daily 10/01/21   [provider]  TRUEplus Lancets 30G MISC USE UP TO FOUR TIMES DAILY AS DIRECTED 10/01/21   [provider]   Family History Family History  Problem Relation Age of Onset   Hypertension Mother    Cancer Father    Hypertension Father    Cancer Brother    Heart disease Paternal Grandmother    Breast cancer Neg Hx    Colon cancer Neg Hx    Social History Social History   Tobacco Use   Smoking status: Never   Smokeless tobacco: Never  Vaping Use   Vaping Use: Never used  Substance Use Topics   Alcohol use: No   Drug use: No  Allergies   Patient has no known allergies.   Review of Systems Review of Systems Per HPI  Physical Exam Triage Vital Signs ED Triage Vitals  Enc Vitals Group     BP 03/20/22 1257 132/79     Pulse Rate 03/20/22 1257 65     Resp 03/20/22 1257 18     Temp 03/20/22 1257 97.8 F (36.6 C)     Temp Source 03/20/22 1257 Oral     SpO2 03/20/22 1257 98 %     Weight --      Height --      Head Circumference --      Peak Flow --      Pain Score 03/20/22 1255 8     Pain Loc --      Pain Edu? --      Excl. in [place]? --    No data found.  Updated Vital Signs BP 132/79 (BP Location: Right Arm)   Pulse 65   Temp 97.8 F (36.6 C) (Oral)   Resp 18   SpO2 98%   Visual Acuity Right Eye Distance:   Left Eye Distance:   Bilateral Distance:    Right Eye Near:   Left Eye Near:    Bilateral Near:     Physical Exam Vitals and nursing note reviewed.  Constitutional:      Appearance: Normal appearance. She is not ill-appearing.  HENT:     Head: Atraumatic.     Right Ear: Tympanic membrane normal.     Left Ear: Tympanic membrane normal.     Nose: Nose normal.     Mouth/Throat:     Mouth: Mucous membranes are moist.     Pharynx:  Posterior oropharyngeal erythema present. No oropharyngeal exudate.  Eyes:     Extraocular Movements: Extraocular movements intact.     Conjunctiva/sclera: Conjunctivae normal.  Cardiovascular:     Rate and Rhythm: Normal rate and regular rhythm.     Heart sounds: Normal heart sounds.  Pulmonary:     Effort: Pulmonary effort is normal.     Breath sounds: Normal breath sounds. No wheezing or rales.  Musculoskeletal:        General: Normal range of motion.     Cervical back: Normal range of motion and neck supple.  Lymphadenopathy:     Cervical: No cervical adenopathy.  Skin:    General: Skin is warm and dry.  Neurological:     Mental Status: She is alert and oriented to person, place, and time.  Psychiatric:        Mood and Affect: Mood normal.        Thought Content: Thought content normal.        Judgment: Judgment normal.      UC Treatments / Results  Labs (all labs ordered are listed, but only abnormal results are displayed) Labs Reviewed  NOVEL CORONAVIRUS, NAA    EKG   Radiology No results found.  Procedures Procedures (including critical care time)  Medications Ordered in UC Medications - No data to display  Initial Impression / Assessment and Plan / UC Course  I have reviewed the triage vital signs and the nursing notes.  Pertinent labs & imaging results that were available during my care of the patient were reviewed by me and considered in my medical decision making (see chart for details).     Suspect viral or aspiratory infection, vitals and exam overall reassuring today.  COVID PCR pending, treat with Phenergan DM, viscous  lidocaine, supportive over-the-counter occasions and home care.  Return for worsening symptoms.  Final Clinical Impressions(s) / UC Diagnoses   Final diagnoses:  Viral URI with cough   Discharge Instructions   None    ED Prescriptions     Medication Sig Dispense Auth. Provider   promethazine-dextromethorphan  (PROMETHAZINE-DM) 6.25-15 MG/5ML syrup Take 5 mLs by mouth 4 (four) times daily as needed. 100 mL Volney American, PA-C   lidocaine (XYLOCAINE) 2 % solution Use as directed 10 mLs in the mouth or throat every 3 (three) hours as needed for mouth pain. 100 mL Volney American, [place]      PDMP not reviewed this encounter.   Volney American, [place] 03/20/22 1313

## 2022-03-21 LAB — NOVEL CORONAVIRUS, NAA: SARS-CoV-2, NAA: NOT DETECTED

## 2022-03-24 ENCOUNTER — Telehealth: Payer: Self-pay | Admitting: Nurse Practitioner

## 2022-03-24 MED ORDER — SEMAGLUTIDE (1 MG/DOSE) 4 MG/3ML ~~LOC~~ SOPN: 1.0000 mg | PEN_INJECTOR | SUBCUTANEOUS | 3 refills | Status: DC

## 2022-03-24 NOTE — Telephone Encounter (Signed)
Pt called and states that when she tried to fill her Mounjaro on Monday she was informed the medication has been being filled under a coupon and looks like it is now at its max. She is unable to afford it as her insurance does not cover it and would like to know about this coupon.  Also states if not that her insurance told her they would cover ozempic but it does require a PA. She is concerned as she is due for her injection tomorrow and is unsure what to do.

## 2022-03-24 NOTE — Telephone Encounter (Signed)
I sent in Rx for Ozempic at 1 mg since [facility] isn't covered.

## 2022-03-24 NOTE — Telephone Encounter (Signed)
They're formulated differently but it is a dose higher than what I would have typically ordered.  They're both step medications.

## 2022-04-09 ENCOUNTER — Ambulatory Visit (INDEPENDENT_AMBULATORY_CARE_PROVIDER_SITE_OTHER): Payer: 59 | Admitting: Cardiology

## 2022-04-09 ENCOUNTER — Encounter: Payer: Self-pay | Admitting: Cardiology

## 2022-04-09 VITALS — BP 122/78 | HR 60 | Ht 63.0 in | Wt 215.0 lb

## 2022-04-09 DIAGNOSIS — I10 Essential (primary) hypertension: Secondary | ICD-10-CM | POA: Diagnosis not present

## 2022-04-09 DIAGNOSIS — G4733 Obstructive sleep apnea (adult) (pediatric): Secondary | ICD-10-CM

## 2022-04-09 NOTE — Patient Instructions (Signed)

## 2022-04-09 NOTE — Progress Notes (Signed)
Sleep Medicine CONSULT Note    Date:  04/09/2022   ID:  Kathryn Merritt, DOB 1960-09-30, MRN [id number]  PCP:  Kathyrn Drown, MD  Cardiologist:  Fransico Him, MD   Chief Complaint  Patient presents with   New Patient (Initial Visit)    Obstructive sleep apnea    History of Present Illness:  Kathryn Merritt is a 61 y.o. female who is being seen today for the evaluation of obstructive sleep apnea at the request of Minus Breeding, MD.  This is a 61 year old female with a history of diabetes mellitus, hyperlipidemia, hypertension, paroxysmal atrial fibrillation, chronic right bundle branch block who was referred for sleep study by Dr. Percival Spanish due to history of atrial fibrillation and complaints of excessive daytime sleepiness with an Epworth sleepiness score 15.  She underwent home sleep study which showed moderate obstructive sleep apnea with an AHI of 17.2/h and nocturnal hypoxemia with O2 saturations as low as 76% and less than 88% for 14.2 minutes.  She underwent CPAP titration to 5 cm H2O and is now here for evaluation.  She is doing well with her CPAP device and thinks that she has gotten used to it.  She tolerates the nasal mask (did not tolerate the FFM) but is still getting used to it.  She feels the pressure is adequate but is concerned that it is so low.  She does not get as tired during the day but does not feel rested when she gets up in the am. She goes to bed between 7-7:30pm and gets up every 2 hours to use the bathroom and gets up at 4am.  She denies any significant mouth or nasal dryness or nasal congestion.  She does not think that he snores.     Past Medical History:  Diagnosis Date   Chronic back pain    Diabetes mellitus    Edema leg    Hyperlipidemia    Hypertension    Joint pain    Kidney stones    Other malaise and fatigue    PAF (paroxysmal atrial fibrillation) ([place])    a. new diagnosis in 02/2021 --> s/p DCCV on 03/18/2021   RBBB    Sinus bradycardia     Sleep apnea     Past Surgical History:  Procedure Laterality Date   BALLOON DILATION N/A 02/22/2022   Procedure: BALLOON DILATION;  Surgeon: Eloise Harman, DO;  [facility];  Service: Endoscopy;  Laterality: N/A;   BIOPSY  02/22/2022   Procedure: BIOPSY;  Surgeon: Eloise Harman, DO;  [facility];  Service: Endoscopy;;   CARDIOVERSION N/A 03/18/2021   Procedure: CARDIOVERSION;  Surgeon: Satira Sark, MD;  [facility];  Service: Cardiovascular;  Laterality: N/A;   CESAREAN SECTION     times 2   COLONOSCOPY WITH PROPOFOL N/A 02/22/2022   Procedure: COLONOSCOPY WITH PROPOFOL;  Surgeon: Eloise Harman, DO;  [facility];  Service: Endoscopy;  Laterality: N/A;  7:30am   ESOPHAGOGASTRODUODENOSCOPY (EGD) WITH PROPOFOL N/A 02/22/2022   Procedure: ESOPHAGOGASTRODUODENOSCOPY (EGD) WITH PROPOFOL;  Surgeon: Eloise Harman, DO;  [facility];  Service: Endoscopy;  Laterality: N/A;   INCISION AND DRAINAGE ABSCESS Right    axilla   LITHOTRIPSY     POLYPECTOMY  02/22/2022   Procedure: POLYPECTOMY;  Surgeon: Eloise Harman, DO;  [facility];  Service: Endoscopy;;    Current Medications: Current Meds  Medication Sig   acetaminophen (  TYLENOL) 500 MG tablet Take 1,000 mg by mouth every 8 (eight) hours as needed for moderate pain.   albuterol (VENTOLIN HFA) 108 (90 Base) MCG/ACT inhaler Inhale 2 puffs into the lungs every 6 (six) hours as needed for wheezing or shortness of breath.   amLODipine (NORVASC) 10 MG tablet Take 10 mg by mouth daily.   apixaban (ELIQUIS) 5 MG TABS tablet TAKE ONE TABLET BY MOUTH TWICE DAILY   Cholecalciferol (VITAMIN D3) 25 MCG (1000 UT) CAPS Take 1 capsule (1,000 Units total) by mouth daily.   diclofenac Sodium (VOLTAREN) 1 % GEL Apply 2 g topically 4 (four) times daily.   lisinopril-hydrochlorothiazide (ZESTORETIC) 20-25 MG tablet Take 1 tablet by mouth daily.   metoprolol succinate (TOPROL-XL) 50 MG  24 hr tablet TAKE ONE TABLET BY MOUTH DAILY. TAKE WITH OR IMMEDIATELY FOLLOWING A MEAL.   omeprazole (PRILOSEC) 40 MG capsule Take 1 capsule (40 mg total) by mouth daily.   Plecanatide (TRULANCE) 3 MG TABS Take 3 mg by mouth daily.   rosuvastatin (CRESTOR) 20 MG tablet Take 1 tablet (20 mg total) by mouth daily. (Patient taking differently: Take 20 mg by mouth at bedtime.)   Semaglutide, 1 MG/DOSE, 4 MG/3ML SOPN Inject 1 mg as directed once a week.    Allergies:   Patient has no known allergies.   Social History   Socioeconomic History   Marital status: Married    Spouse name: Not on file   Number of children: 2   Years of education: Not on file   Highest education level: Not on file  Occupational History   Occupation: Occupational psychologist- Teacher  Tobacco Use   Smoking status: Never   Smokeless tobacco: Never  Vaping Use   Vaping Use: Never used  Substance and Sexual Activity   Alcohol use: No   Drug use: No   Sexual activity: Not Currently  Other Topics Concern   Not on file  Social History Narrative   Live with her husband, works at [employer] center,    Social Determinants of Health   Financial Resource Strain: Not on file  Food Insecurity: Not on file  Transportation Needs: Not on file  Physical Activity: Not on file  Stress: Not on file  Social Connections: Not on file     Family History:  The patient's family history includes Cancer in her brother and father; Heart disease in her paternal grandmother; Hypertension in her father and mother.   ROS:   Please see the history of present illness.    ROS All other systems reviewed and are negative.      No data to display             PHYSICAL EXAM:   VS:  BP 122/78   Pulse 60   Ht '5\' 3"'$  (1.6 m)   Wt 215 lb (97.5 kg)   BMI 38.09 kg/m    GEN: Well nourished, well developed, in no acute distress  HEENT: normal  Neck: no JVD, carotid bruits, or masses Cardiac: RRR; no murmurs, rubs,  or gallops,no edema.  Intact distal pulses bilaterally.  Respiratory:  clear to auscultation bilaterally, normal work of breathing GI: soft, nontender, nondistended, + BS MS: no deformity or atrophy  Skin: warm and dry, no rash Neuro:  Alert and Oriented x 3, Strength and sensation are intact Psych: euthymic mood, full affect  Wt Readings from Last 3 Encounters:  04/09/22 215 lb (97.5 kg)  02/22/22 246 lb 14.6  oz (112 kg)  01/21/22 246 lb (111.6 kg)      Studies/Labs Reviewed:   Home sleep study, CPAP titration and Pap compliance download  Recent Labs: 10/23/2021: TSH 3.690 12/31/2021: ALT 19; BUN 30; Creatinine, Ser 1.16; Hemoglobin 13.4; Platelets 259; Potassium 3.9; Sodium 141   Lipid Panel    Component Value Date/Time   CHOL 137 09/23/2021 1223   TRIG 162 (H) 09/23/2021 1223   HDL 45 09/23/2021 1223   CHOLHDL 3.0 09/23/2021 1223   CHOLHDL 3.0 05/06/2021 0833   VLDL 18 05/06/2021 0833   LDLCALC 64 09/23/2021 1223     Additional studies/ records that were reviewed today include:  Office visit notes from Dr. Percival Spanish    ASSESSMENT:    1. OSA (obstructive sleep apnea)   2. Essential hypertension      PLAN:  In order of problems listed above:  OSA - The patient is tolerating PAP therapy well without any problems. The PAP download performed by his DME was personally reviewed and interpreted by me today and showed an AHI of 6.1 /hr on 5 cm H2O with 87% compliance in using more than 4 hours nightly.  The patient has been using and benefiting from PAP use and will continue to benefit from therapy.  -Increase CPAP to 7 cm H2O due to increased AHI -Repeat download in 4 weeks to make sure her AHI is under control -unfortunately she does not feel rested in the am but is due to frequent trips to the bathroom every 2 hours which has been going on for years -she has noticed an improvement in her daytime sleepiness  Hypertension -BP is controlled on exam today -Continue  prescription drug management with amlodipine 10 mg daily, lisinopril HCT 20-25 mg daily with as needed refills  Time Spent: 20 minutes total time of encounter, including 15 minutes spent in face-to-face patient care on the date of this encounter. This time includes coordination of care and counseling regarding above mentioned problem list. Remainder of non-face-to-face time involved reviewing chart documents/testing relevant to the patient encounter and documentation in the medical record. I have independently reviewed documentation from referring provider  Medication Adjustments/Labs and Tests Ordered: Current medicines are reviewed at length with the patient today.  Concerns regarding medicines are outlined above.  Medication changes, Labs and Tests ordered today are listed in the Patient Instructions below.  There are no Patient Instructions on file for this visit.   Signed, Fransico Him, MD [facility], [place] Board of Sleep Medicine 04/09/2022 10:54 AM    [address], [place], [place]  [address] Phone: [phone]; Fax: [phone]

## 2022-04-13 ENCOUNTER — Telehealth: Payer: Self-pay | Admitting: *Deleted

## 2022-04-13 DIAGNOSIS — G4733 Obstructive sleep apnea (adult) (pediatric): Secondary | ICD-10-CM

## 2022-04-13 NOTE — Telephone Encounter (Signed)
-----   Message from [place], [place] sent at 04/09/2022 11:03 AM EDT ----- Per Dr. Radford Pax: Increase CPAP to 7 cm H2O due to increased AHI -Repeat download in 4 weeks to make sure her AHI is under control Thanks!

## 2022-04-13 NOTE — Telephone Encounter (Signed)
Order placed to [place] via community message

## 2022-04-14 ENCOUNTER — Ambulatory Visit (INDEPENDENT_AMBULATORY_CARE_PROVIDER_SITE_OTHER): Payer: 59 | Admitting: Nurse Practitioner

## 2022-04-14 ENCOUNTER — Encounter: Payer: Self-pay | Admitting: Nurse Practitioner

## 2022-04-14 VITALS — BP 136/79 | HR 55 | Ht 63.0 in | Wt 247.0 lb

## 2022-04-14 DIAGNOSIS — E1165 Type 2 diabetes mellitus with hyperglycemia: Secondary | ICD-10-CM

## 2022-04-14 DIAGNOSIS — E559 Vitamin D deficiency, unspecified: Secondary | ICD-10-CM | POA: Diagnosis not present

## 2022-04-14 DIAGNOSIS — I10 Essential (primary) hypertension: Secondary | ICD-10-CM | POA: Diagnosis not present

## 2022-04-14 DIAGNOSIS — E782 Mixed hyperlipidemia: Secondary | ICD-10-CM

## 2022-04-14 LAB — POCT GLYCOSYLATED HEMOGLOBIN (HGB A1C): HbA1c POC (<> result, manual entry): 6.6 % (ref 4.0–5.6)

## 2022-04-14 NOTE — Progress Notes (Signed)
Endocrinology Follow Up Note       04/14/2022, 3:51 PM   Subjective:    Patient ID: Kathryn Merritt, female    DOB: 1961/03/22.  Kathryn Merritt is being seen in follow up after being seen in consultation for management of currently uncontrolled symptomatic diabetes requested by  Kathyrn Drown, MD.   Past Medical History:  Diagnosis Date   Chronic back pain    Diabetes mellitus    Edema leg    Hyperlipidemia    Hypertension    Joint pain    Kidney stones    Other malaise and fatigue    PAF (paroxysmal atrial fibrillation) ([place])    a. new diagnosis in 02/2021 --> s/p DCCV on 03/18/2021   RBBB    Sinus bradycardia    Sleep apnea     Past Surgical History:  Procedure Laterality Date   BALLOON DILATION N/A 02/22/2022   Procedure: BALLOON DILATION;  Surgeon: Eloise Harman, DO;  [facility];  Service: Endoscopy;  Laterality: N/A;   BIOPSY  02/22/2022   Procedure: BIOPSY;  Surgeon: Eloise Harman, DO;  [facility];  Service: Endoscopy;;   CARDIOVERSION N/A 03/18/2021   Procedure: CARDIOVERSION;  Surgeon: Satira Sark, MD;  Location: [facility];  Service: Cardiovascular;  Laterality: N/A;   CESAREAN SECTION     times 2   COLONOSCOPY WITH PROPOFOL N/A 02/22/2022   Procedure: COLONOSCOPY WITH PROPOFOL;  Surgeon: Eloise Harman, DO;  [facility];  Service: Endoscopy;  Laterality: N/A;  7:30am   ESOPHAGOGASTRODUODENOSCOPY (EGD) WITH PROPOFOL N/A 02/22/2022   Procedure: ESOPHAGOGASTRODUODENOSCOPY (EGD) WITH PROPOFOL;  Surgeon: Eloise Harman, DO;  [facility];  Service: Endoscopy;  Laterality: N/A;   INCISION AND DRAINAGE ABSCESS Right    axilla   LITHOTRIPSY     POLYPECTOMY  02/22/2022   Procedure: POLYPECTOMY;  Surgeon: Eloise Harman, DO;  [facility];  Service: Endoscopy;;    Social History   Socioeconomic History   Marital status:  Married    Spouse name: Not on file   Number of children: 2   Years of education: Not on file   Highest education level: Not on file  Occupational History   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  Tobacco Use   Smoking status: Never   Smokeless tobacco: Never  Vaping Use   Vaping Use: Never used  Substance and Sexual Activity   Alcohol use: No   Drug use: No   Sexual activity: Not Currently  Other Topics Concern   Not on file  Social History Narrative   Live with her husband, works at [employer] center,    Social Determinants of Health   Financial Resource Strain: Not on file  Food Insecurity: Not on file  Transportation Needs: Not on file  Physical Activity: Not on file  Stress: Not on file  Social Connections: Not on file    Family History  Problem Relation Age of Onset   Hypertension Mother    Cancer Father    Hypertension Father    Cancer Brother    Heart disease Paternal Grandmother  Breast cancer Neg Hx    Colon cancer Neg Hx     Outpatient Encounter Medications as of 04/14/2022  Medication Sig   acetaminophen (TYLENOL) 500 MG tablet Take 1,000 mg by mouth every 8 (eight) hours as needed for moderate pain.   albuterol (VENTOLIN HFA) 108 (90 Base) MCG/ACT inhaler Inhale 2 puffs into the lungs every 6 (six) hours as needed for wheezing or shortness of breath.   amLODipine (NORVASC) 10 MG tablet Take 10 mg by mouth daily.   apixaban (ELIQUIS) 5 MG TABS tablet TAKE ONE TABLET BY MOUTH TWICE DAILY   blood glucose meter kit and supplies Dispense based on patient and insurance preference. Use up to four times daily as directed. (FOR ICD-10 E10.9, E11.9).   Cholecalciferol (VITAMIN D3) 25 MCG (1000 UT) CAPS Take 1 capsule (1,000 Units total) by mouth daily.   diclofenac Sodium (VOLTAREN) 1 % GEL Apply 2 g topically 4 (four) times daily.   lisinopril-hydrochlorothiazide (ZESTORETIC) 20-25 MG tablet Take 1 tablet by mouth daily.   metoprolol  succinate (TOPROL-XL) 50 MG 24 hr tablet TAKE ONE TABLET BY MOUTH DAILY. TAKE WITH OR IMMEDIATELY FOLLOWING A MEAL.   omeprazole (PRILOSEC) 40 MG capsule Take 1 capsule (40 mg total) by mouth daily.   Plecanatide (TRULANCE) 3 MG TABS Take 3 mg by mouth daily.   promethazine-dextromethorphan (PROMETHAZINE-DM) 6.25-15 MG/5ML syrup Take 5 mLs by mouth 4 (four) times daily as needed. (Patient not taking: Reported on 04/09/2022)   rosuvastatin (CRESTOR) 20 MG tablet Take 1 tablet (20 mg total) by mouth daily. (Patient taking differently: Take 20 mg by mouth at bedtime.)   Semaglutide, 1 MG/DOSE, 4 MG/3ML SOPN Inject 1 mg as directed once a week.   TRUE METRIX BLOOD GLUCOSE TEST test strip SMARTSIG:Via Meter 1-4 Times Daily   TRUEplus Lancets 30G MISC USE UP TO FOUR TIMES DAILY AS DIRECTED   No facility-administered encounter medications on file as of 04/14/2022.    ALLERGIES: No Known Allergies  VACCINATION STATUS: Immunization History  Administered Date(s) Administered   Influenza,inj,Quad PF,6+ Mos 06/01/2021   Influenza,trivalent, recombinat, inj, PF 09/07/2014   Moderna SARS-COV2 Booster Vaccination 09/30/2020   Moderna Sars-Covid-2 Vaccination 11/20/2019, 12/18/2019    Diabetes She presents for her follow-up diabetic visit. She has type 2 diabetes mellitus. Onset time: diagnosed at approx age of 57. Her disease course has been stable. There are no hypoglycemic associated symptoms. Associated symptoms include fatigue. Pertinent negatives for diabetes include no polydipsia, no polyuria and no weight loss. There are no hypoglycemic complications. Symptoms are improving. Diabetic complications include heart disease (Afib) and nephropathy. Risk factors for coronary artery disease include diabetes mellitus, dyslipidemia, family history, obesity, hypertension, post-menopausal and sedentary lifestyle. Current diabetic treatment includes oral agent (monotherapy) (and Mounjaro). She is compliant with  treatment most of the time. Her weight is decreasing steadily. She is following a generally healthy diet. When asked about meal planning, she reported none. She has not had a previous visit with a dietitian. She rarely participates in exercise. Her breakfast blood glucose range is generally 140-180 mg/dl. (She presents today with her logs showing slightly above target fasting glycemic profile.  Her POCT A1c today is 6.6%, stable from last visit.  She was switched to Ozempic 1 mg between visits due to insurance preference.  She has tolerated it well.  She denies any s/s of hypoglycemia.) An ACE inhibitor/angiotensin II receptor blocker is being taken. She sees a podiatrist.Eye exam is current (having next exam sometime next  week).  Hypertension This is a chronic problem. The current episode started more than 1 year ago. The problem has been resolved since onset. The problem is controlled. There are no associated agents to hypertension. Risk factors for coronary artery disease include diabetes mellitus, dyslipidemia, family history, obesity, post-menopausal state and sedentary lifestyle. Past treatments include diuretics, beta blockers, ACE inhibitors and calcium channel blockers. The current treatment provides moderate improvement. Compliance problems include diet and exercise.  Hypertensive end-organ damage includes kidney disease. Identifiable causes of hypertension include chronic renal disease.  Hyperlipidemia This is a chronic problem. The current episode started more than 1 year ago. The problem is uncontrolled. Recent lipid tests were reviewed and are variable. Exacerbating diseases include chronic renal disease, diabetes and obesity. Factors aggravating her hyperlipidemia include beta blockers, fatty foods and thiazides. Current antihyperlipidemic treatment includes statins. The current treatment provides mild improvement of lipids. Compliance problems include adherence to diet and adherence to exercise.   Risk factors for coronary artery disease include diabetes mellitus, dyslipidemia, family history, obesity, hypertension, a sedentary lifestyle and post-menopausal.     Review of systems  Constitutional: + Minimally fluctuating body weight,  current Body mass index is 43.75 kg/m. , no fatigue, no subjective hyperthermia, no subjective hypothermia Eyes: no blurry vision, no xerophthalmia ENT: no sore throat, no nodules palpated in throat, no dysphagia/odynophagia, no hoarseness Cardiovascular: no chest pain, no shortness of breath, no palpitations, no leg swelling Respiratory: no cough, no shortness of breath Gastrointestinal: no nausea/vomiting/diarrhea Musculoskeletal: no muscle/joint aches Skin: no rashes, no hyperemia Neurological: no tremors, + numbness/tingling to left foot-intermittent, no dizziness Psychiatric: no depression, no anxiety  Objective:     BP 136/79   Pulse (!) 55   Ht _0  (1.6 m)   Wt 247 lb (112 kg)   BMI 43.75 kg/m   Wt Readings from Last 3 Encounters:  04/14/22 247 lb (112 kg)  04/09/22 215 lb (97.5 kg)  02/22/22 246 lb 14.6 oz (112 kg)     BP Readings from Last 3 Encounters:  04/14/22 136/79  04/09/22 122/78  03/20/22 132/79      Physical Exam- Limited  Constitutional:  Body mass index is 43.75 kg/m. , not in acute distress, normal state of mind Eyes:  EOMI, no exophthalmos Neck: Supple Cardiovascular: RRR, no murmurs, rubs, or gallops, no edema Respiratory: Adequate breathing efforts, no crackles, rales, rhonchi, or wheezing Musculoskeletal: no gross deformities, strength intact in all four extremities, no gross restriction of joint movements Skin:  no rashes, no hyperemia Neurological: no tremor with outstretched hands    CMP ( most recent) CMP     Component Value Date/Time   NA 141 12/31/2021 0959   NA 144 09/23/2021 1223   K 3.9 12/31/2021 0959   CL 107 12/31/2021 0959   CO2 27 12/31/2021 0959   GLUCOSE 78 12/31/2021 0959    BUN 30 (H) 12/31/2021 0959   BUN 29 (H) 09/23/2021 1223   CREATININE 1.16 (H) 12/31/2021 0959   CALCIUM 10.3 12/31/2021 0959   PROT 6.6 12/31/2021 0959   PROT 6.7 09/23/2021 1223   ALBUMIN 3.6 12/31/2021 0959   ALBUMIN 4.4 09/23/2021 1223   AST 14 (L) 12/31/2021 0959   ALT 19 12/31/2021 0959   ALKPHOS 42 12/31/2021 0959   BILITOT 0.4 12/31/2021 0959   BILITOT 0.4 09/23/2021 1223   GFRNONAA 54 (L) 12/31/2021 0959   GFRAA >60 02/22/2020 0924     Diabetic Labs (most recent): Lab Results  Component Value  Date   HGBA1C 6.6 04/14/2022   HGBA1C 6.5 01/13/2022   HGBA1C 8.8 (H) 09/23/2021     Lipid Panel ( most recent) Lipid Panel     Component Value Date/Time   CHOL 137 09/23/2021 1223   TRIG 162 (H) 09/23/2021 1223   HDL 45 09/23/2021 1223   CHOLHDL 3.0 09/23/2021 1223   CHOLHDL 3.0 05/06/2021 0833   VLDL 18 05/06/2021 0833   LDLCALC 64 09/23/2021 1223   LABVLDL 28 09/23/2021 1223      Lab Results  Component Value Date   TSH 3.690 10/23/2021   TSH 4.550 (H) 09/23/2021   TSH 6.135 (H) 03/18/2021   TSH 1.74 06/23/2012   FREET4 1.01 10/23/2021   FREET4 1.00 09/23/2021   FREET4 0.78 03/18/2021   FREET4 0.73 06/23/2012           Assessment & Plan:   1) Controlled type 2 diabetes mellitus with hyperglycemia, without long-term current use of insulin ([place])  She presents today with her logs showing slightly above target fasting glycemic profile.  Her POCT A1c today is 6.6%, stable from last visit.  She was switched to Ozempic 1 mg between visits due to insurance preference.  She has tolerated it well.  She denies any s/s of hypoglycemia.  - Kathryn Merritt has currently uncontrolled symptomatic type 2 DM since 61 years of age.   -Recent labs reviewed.  - Nutritional counseling repeated at each appointment due to patients tendency to fall back in to old habits.  - The patient admits there is a room for improvement in their diet and drink choices. -  Suggestion is  made for the patient to avoid simple carbohydrates from their diet including Cakes, Sweet Desserts / Pastries, Ice Cream, Soda (diet and regular), Sweet Tea, Candies, Chips, Cookies, Sweet Pastries, Store Bought Juices, Alcohol in Excess of 1-2 drinks a day, Artificial Sweeteners, Coffee Creamer, and "Sugar-free" Products. This will help patient to have stable blood glucose profile and potentially avoid unintended weight gain.   - I encouraged the patient to switch to unprocessed or minimally processed complex starch and increased protein intake (animal or plant source), fruits, and vegetables.   - Patient is advised to stick to a routine mealtimes to eat 3 meals a day and avoid unnecessary snacks (to snack only to correct hypoglycemia).  The following Lifestyle Medicine recommendations according to [facility]) were discussed and offered to patient and she agrees to start the journey:  A. Whole Foods, Plant-based plate comprising of fruits and vegetables, plant-based proteins, whole-grain carbohydrates was discussed in detail with the patient.   A list for source of those nutrients were also provided to the patient.  Patient will use only water or unsweetened tea for hydration. B.  The need to stay away from risky substances including alcohol, smoking; obtaining 7 to 9 hours of restorative sleep, at least 150 minutes of moderate intensity exercise weekly, the importance of healthy social connections,  and stress reduction techniques were discussed. C.  A full color page of  Calorie density of various food groups per pound showing examples of each food groups was provided to the patient. - I have approached her with the following individualized plan to manage her diabetes and patient agrees:   [employer] did not provide optimal coverage for [facility], therefore she was switched to Platter.  She has tolerated it well with only minimal nausea.  She is advised to continue  Ozempic 1 mg SQ  weekly and reach out before she requests refill to see how she feels about increasing her dose to 2 mg.   -she does not have to routinely monitor glucose with safe regimen of Mounjaro only.  She can monitor fasting for the next several days since she is stopping the Glimepiride.  - Adjustment parameters are given to her for hypo and hyperglycemia in writing.  - Specific targets for  A1c; LDL, HDL, and Triglycerides were discussed with the patient.  2) Blood Pressure /Hypertension:  her blood pressure is controlled to target.   she is advised to continue her current medications including Norvasc 10 mg po daily, Metoprolol 50 mg po daily, and Lisinopril-HCT 20-25 mg p.o. daily with breakfast.  3) Lipids/Hyperlipidemia:    Review of her recent lipid panel from 09/23/21 showed controlled LDL at 64 and slightly elevated triglycerides of 162 .  she is advised to continue Crestor 20 mg daily at bedtime.  Side effects and precautions discussed with her.  4)  Weight/Diet:  her Body mass index is 43.75 kg/m.  -  clearly complicating her diabetes care.   she is a candidate for weight loss. I discussed with her the fact that loss of 5 - 10% of her  current body weight will have the most impact on her diabetes management.  Exercise, and detailed carbohydrates information provided  -  detailed on discharge instructions.  5) Vitamin D deficiency- Her most recent vitamin D level was 16.5 on 09/23/21.  She is currently on supplementation prescribed by her PCP.  6) Chronic Care/Health Maintenance: -she is on ACEI/ARB and Statin medications and is encouraged to initiate and continue to follow up with Ophthalmology, Dentist, Podiatrist at least yearly or according to recommendations, and advised to stay away from smoking. I have recommended yearly flu vaccine and pneumonia vaccine at least every 5 years; moderate intensity exercise for up to 150 minutes weekly; and sleep for at least 7 hours a  day.  - she is advised to maintain close follow up with Kathyrn Drown, MD for primary care needs, as well as her other providers for optimal and coordinated care.      I spent 34 minutes in the care of the patient today including review of labs from [place], Lipids, Thyroid Function, Hematology (current and previous including abstractions from other facilities); face-to-face time discussing  her blood glucose readings/logs, discussing hypoglycemia and hyperglycemia episodes and symptoms, medications doses, her options of short and long term treatment based on the latest standards of care / guidelines;  discussion about incorporating lifestyle medicine;  and documenting the encounter. Risk reduction counseling performed per USPSTF guidelines to reduce obesity and cardiovascular risk factors.     Please refer to Patient Instructions for Blood Glucose Monitoring and Insulin/Medications Dosing Guide"  in media tab for additional information. Please  also refer to " Patient Self Inventory" in the Media  tab for reviewed elements of pertinent patient history.  Kathryn Merritt participated in the discussions, expressed understanding, and voiced agreement with the above plans.  All questions were answered to her satisfaction. she is encouraged to contact clinic should she have any questions or concerns prior to her return visit.     Follow up plan: - Return in about 4 months (around 08/15/2022) for Diabetes F/U- A1c and UM in office.   Kathryn Merritt, [facility] [address] [place], [place] [address] Phone: [phone] Fax: [phone]  04/14/2022, 3:51 PM

## 2022-04-15 ENCOUNTER — Other Ambulatory Visit: Payer: Self-pay | Admitting: Nurse Practitioner

## 2022-04-15 ENCOUNTER — Other Ambulatory Visit: Payer: Self-pay | Admitting: Cardiovascular Disease

## 2022-04-15 DIAGNOSIS — E559 Vitamin D deficiency, unspecified: Secondary | ICD-10-CM

## 2022-04-15 NOTE — Telephone Encounter (Signed)
This is Dr. Hochrein's pt. °

## 2022-04-15 NOTE — Telephone Encounter (Signed)
*  STAT* If patient is at the pharmacy, call can be transferred to refill team.   1. Which medications need to be refilled? (please list name of each medication and dose if known) Amlodipine  2. Which pharmacy/location (including street and city if local pharmacy) is medication to be sent to?[address] , [place], [place]  3. Do they need a 30 day or 90 day supply? 90 days and refills

## 2022-04-15 NOTE — Telephone Encounter (Signed)
Refill request

## 2022-04-22 ENCOUNTER — Telehealth: Payer: Self-pay | Admitting: Nurse Practitioner

## 2022-04-22 MED ORDER — SEMAGLUTIDE (1 MG/DOSE) 4 MG/3ML ~~LOC~~ SOPN: 1.0000 mg | PEN_INJECTOR | SUBCUTANEOUS | 1 refills | Status: DC

## 2022-04-22 MED ORDER — SEMAGLUTIDE (2 MG/DOSE) 8 MG/3ML ~~LOC~~ SOPN: 2.0000 mg | PEN_INJECTOR | SUBCUTANEOUS | 2 refills | Status: DC

## 2022-04-22 NOTE — Telephone Encounter (Signed)
New message   Was told that medication would be increase to Ozempic 2 mg - took last shot today.    1. Which medications need to be refilled? (please list name of each medication and dose if known) Semaglutide, 1 MG/DOSE, 4 MG/3ML SOPN  2. Which pharmacy/location (including street and city if local pharmacy) is medication to be sent to?[facility] in [place]   3. Do they need a 30 day or 90 day supply? 30 day supply

## 2022-04-22 NOTE — Telephone Encounter (Signed)
Pt said it was suppose to be '2mg'$ , please advise

## 2022-04-22 NOTE — Addendum Note (Signed)
Addended by: Lavell Luster A on: 04/22/2022 03:35 PM   Modules accepted: Orders

## 2022-04-22 NOTE — Telephone Encounter (Signed)
Rx sent 

## 2022-04-26 ENCOUNTER — Other Ambulatory Visit: Payer: Self-pay | Admitting: Cardiovascular Disease

## 2022-04-26 NOTE — Telephone Encounter (Signed)
Prescription refill request for Eliquis received. Indication: PAF Last office visit: 04/09/22  Ashok Norris MD Scr: 1.16 on 12/31/21 Age: 61 Weight: 97.5kg  Based on above findings Eliquis '5mg'$  twice daily is the appropriate dose.  Refill approved.

## 2022-05-11 ENCOUNTER — Other Ambulatory Visit: Payer: Self-pay

## 2022-05-11 DIAGNOSIS — E785 Hyperlipidemia, unspecified: Secondary | ICD-10-CM

## 2022-05-11 MED ORDER — ROSUVASTATIN CALCIUM 20 MG PO TABS: 20.0000 mg | ORAL_TABLET | Freq: Every day | ORAL | 3 refills | Status: DC

## 2022-05-26 ENCOUNTER — Ambulatory Visit: Payer: 59 | Admitting: Nurse Practitioner

## 2022-05-27 ENCOUNTER — Telehealth: Payer: Self-pay | Admitting: Nurse Practitioner

## 2022-05-27 MED ORDER — TIRZEPATIDE 7.5 MG/0.5ML ~~LOC~~ SOAJ: 7.5000 mg | SUBCUTANEOUS | 1 refills | Status: DC

## 2022-05-27 NOTE — Telephone Encounter (Signed)
I sent in for Mounjaro 7.5 mg SQ weekly to the pharmacy listed on file.  I also put in pharmacy comments her new insurance ID number

## 2022-05-27 NOTE — Telephone Encounter (Signed)
Pt said she has new insurance, [employer]. She does not have the actual card yet but said this is her ID number [id number]. She said she did have Friday health plan and was switched to [employer] for insurance purposes. She said could you possibly try sending in the [facility] to see if it is covered this time. If not, she needs the Ozempic called in, even though she is not ready for a refill. Call back # [phone]

## 2022-06-07 ENCOUNTER — Encounter (HOSPITAL_COMMUNITY): Payer: Self-pay | Admitting: Emergency Medicine

## 2022-06-07 ENCOUNTER — Observation Stay (HOSPITAL_COMMUNITY)
Admission: EM | Admit: 2022-06-07 | Discharge: 2022-06-09 | Disposition: A | Discharge status: Home / Self Care | Payer: 59 | Attending: Internal Medicine | Admitting: Internal Medicine

## 2022-06-07 ENCOUNTER — Other Ambulatory Visit: Payer: Self-pay

## 2022-06-07 ENCOUNTER — Ambulatory Visit
Admission: EM | Admit: 2022-06-07 | Discharge: 2022-06-07 | Disposition: A | Discharge status: Home / Self Care | Payer: 59 | Attending: Nurse Practitioner | Admitting: Nurse Practitioner

## 2022-06-07 ENCOUNTER — Emergency Department (HOSPITAL_COMMUNITY): Payer: 59

## 2022-06-07 DIAGNOSIS — G4733 Obstructive sleep apnea (adult) (pediatric): Secondary | ICD-10-CM | POA: Diagnosis present

## 2022-06-07 DIAGNOSIS — Z6841 Body Mass Index (BMI) 40.0 and over, adult: Secondary | ICD-10-CM | POA: Diagnosis not present

## 2022-06-07 DIAGNOSIS — E785 Hyperlipidemia, unspecified: Secondary | ICD-10-CM | POA: Diagnosis present

## 2022-06-07 DIAGNOSIS — Z7901 Long term (current) use of anticoagulants: Secondary | ICD-10-CM | POA: Insufficient documentation

## 2022-06-07 DIAGNOSIS — I200 Unstable angina: Secondary | ICD-10-CM | POA: Diagnosis not present

## 2022-06-07 DIAGNOSIS — R079 Chest pain, unspecified: Secondary | ICD-10-CM | POA: Diagnosis not present

## 2022-06-07 DIAGNOSIS — Z79899 Other long term (current) drug therapy: Secondary | ICD-10-CM | POA: Diagnosis not present

## 2022-06-07 DIAGNOSIS — R0600 Dyspnea, unspecified: Secondary | ICD-10-CM | POA: Diagnosis not present

## 2022-06-07 DIAGNOSIS — I480 Paroxysmal atrial fibrillation: Secondary | ICD-10-CM | POA: Diagnosis present

## 2022-06-07 DIAGNOSIS — I4891 Unspecified atrial fibrillation: Secondary | ICD-10-CM | POA: Diagnosis not present

## 2022-06-07 DIAGNOSIS — E119 Type 2 diabetes mellitus without complications: Secondary | ICD-10-CM

## 2022-06-07 DIAGNOSIS — R0602 Shortness of breath: Secondary | ICD-10-CM

## 2022-06-07 DIAGNOSIS — I10 Essential (primary) hypertension: Secondary | ICD-10-CM | POA: Diagnosis not present

## 2022-06-07 DIAGNOSIS — E1169 Type 2 diabetes mellitus with other specified complication: Secondary | ICD-10-CM | POA: Diagnosis present

## 2022-06-07 DIAGNOSIS — E669 Obesity, unspecified: Secondary | ICD-10-CM | POA: Diagnosis not present

## 2022-06-07 LAB — BASIC METABOLIC PANEL
Anion gap: 7 (ref 5–15)
BUN: 22 mg/dL — ABNORMAL HIGH (ref 6–20)
CO2: 26 mmol/L (ref 22–32)
Calcium: 10 mg/dL (ref 8.9–10.3)
Chloride: 106 mmol/L (ref 98–111)
Creatinine, Ser: 0.92 mg/dL (ref 0.44–1.00)
GFR, Estimated: >60 mL/min (ref >60)
Glucose, Bld: 101 mg/dL — ABNORMAL HIGH (ref 70–99)
Potassium: 3.7 mmol/L (ref 3.5–5.1)
Sodium: 139 mmol/L (ref 135–145)

## 2022-06-07 LAB — CBC
HCT: 41.8 % (ref 36.0–46.0)
Hemoglobin: 13.7 g/dL (ref 12.0–15.0)
MCH: 29.4 pg (ref 26.0–34.0)
MCHC: 32.8 g/dL (ref 30.0–36.0)
MCV: 89.7 fL (ref 80.0–100.0)
Platelets: 249 10*3/uL (ref 150–400)
RBC: 4.66 MIL/uL (ref 3.87–5.11)
RDW: 14.6 % (ref 11.5–15.5)
WBC: 8.1 10*3/uL (ref 4.0–10.5)
nRBC: 0 % (ref 0.0–0.2)

## 2022-06-07 LAB — TROPONIN I (HIGH SENSITIVITY)
Troponin I (High Sensitivity): 7 ng/L (ref <18)
Troponin I (High Sensitivity): 9 ng/L (ref <18)

## 2022-06-07 LAB — GLUCOSE, CAPILLARY: Glucose-Capillary: 122 mg/dL — ABNORMAL HIGH (ref 70–99)

## 2022-06-07 MED ORDER — ACETAMINOPHEN 650 MG RE SUPP: 650.0000 mg | Freq: Four times a day (QID) | RECTAL | Status: DC | PRN

## 2022-06-07 MED ORDER — LISINOPRIL-HYDROCHLOROTHIAZIDE 20-25 MG PO TABS: 1.0000 | ORAL_TABLET | Freq: Every day | ORAL | Status: DC

## 2022-06-07 MED ORDER — APIXABAN 5 MG PO TABS
5.0000 mg | ORAL_TABLET | Freq: Two times a day (BID) | ORAL | Status: DC
  Administered 2022-06-07: 5 mg via ORAL
  Filled 2022-06-07 (×2): qty 1

## 2022-06-07 MED ORDER — ROSUVASTATIN CALCIUM 20 MG PO TABS
10.0000 mg | ORAL_TABLET | Freq: Every day | ORAL | Status: DC
  Administered 2022-06-08: 10 mg via ORAL
  Filled 2022-06-07 (×2): qty 1

## 2022-06-07 MED ORDER — PANTOPRAZOLE SODIUM 40 MG PO TBEC
40.0000 mg | DELAYED_RELEASE_TABLET | Freq: Every day | ORAL | Status: DC
  Administered 2022-06-08 – 2022-06-09 (×2): 40 mg via ORAL
  Filled 2022-06-07 (×2): qty 1

## 2022-06-07 MED ORDER — ASPIRIN 81 MG PO CHEW
324.0000 mg | CHEWABLE_TABLET | Freq: Once | ORAL | Status: AC
  Administered 2022-06-07: 324 mg via ORAL
  Filled 2022-06-07: qty 4

## 2022-06-07 MED ORDER — ONDANSETRON HCL 4 MG PO TABS: 4.0000 mg | ORAL_TABLET | Freq: Four times a day (QID) | ORAL | Status: DC | PRN

## 2022-06-07 MED ORDER — LISINOPRIL 10 MG PO TABS
20.0000 mg | ORAL_TABLET | Freq: Every day | ORAL | Status: DC
  Administered 2022-06-08 – 2022-06-09 (×2): 20 mg via ORAL
  Filled 2022-06-07 (×2): qty 2

## 2022-06-07 MED ORDER — METOPROLOL SUCCINATE ER 50 MG PO TB24
50.0000 mg | ORAL_TABLET | Freq: Every day | ORAL | Status: DC
  Administered 2022-06-08 – 2022-06-09 (×2): 50 mg via ORAL
  Filled 2022-06-07 (×2): qty 1

## 2022-06-07 MED ORDER — ACETAMINOPHEN 325 MG PO TABS: 650.0000 mg | ORAL_TABLET | Freq: Four times a day (QID) | ORAL | Status: DC | PRN

## 2022-06-07 MED ORDER — ONDANSETRON HCL 4 MG/2ML IJ SOLN: 4.0000 mg | Freq: Four times a day (QID) | INTRAMUSCULAR | Status: DC | PRN

## 2022-06-07 MED ORDER — HYDROCHLOROTHIAZIDE 25 MG PO TABS
25.0000 mg | ORAL_TABLET | Freq: Every day | ORAL | Status: DC
  Administered 2022-06-08: 25 mg via ORAL
  Filled 2022-06-07: qty 1

## 2022-06-07 MED ORDER — AMLODIPINE BESYLATE 5 MG PO TABS
10.0000 mg | ORAL_TABLET | Freq: Every day | ORAL | Status: DC
  Administered 2022-06-08 – 2022-06-09 (×2): 10 mg via ORAL
  Filled 2022-06-07 (×2): qty 2

## 2022-06-07 MED ORDER — POLYETHYLENE GLYCOL 3350 17 G PO PACK: 17.0000 g | PACK | Freq: Every day | ORAL | Status: DC | PRN

## 2022-06-07 MED ORDER — INSULIN ASPART 100 UNIT/ML IJ SOLN
0.0000 [IU] | Freq: Four times a day (QID) | INTRAMUSCULAR | Status: DC
  Administered 2022-06-07 – 2022-06-08 (×2): 2 [IU] via SUBCUTANEOUS
  Administered 2022-06-08: 3 [IU] via SUBCUTANEOUS
  Administered 2022-06-08 – 2022-06-09 (×2): 2 [IU] via SUBCUTANEOUS

## 2022-06-07 NOTE — ED Notes (Signed)
Pt in bed, pt reports chest pain for the past few days and some sob, states that it is worse with exertion, states that it is better at rest, pt talking in full sentences, resps even and unlabored. Pt awaits md eval

## 2022-06-07 NOTE — H&P (Signed)
History and Physical    Kathryn Merritt GBT:[id number] DOB: 04-13-1961 DOA: 06/07/2022  PCP: Renee Rival, FNP   Patient coming from: Home  I have personally briefly reviewed patient's old medical records in [place]  Chief Complaint: Chest pain  HPI: Kathryn Merritt is a 61 y.o. female with medical history significant for diabetes mellitus, hypertension, OSA, paroxysmal atrial fibrillation on anticoagulation.   Patient presented to the ED with complaints of midsternal chest pain, ongoing over the past 4 to 5 days.  Reports radiation around her chest to her back, and towards her right neck.  She describes pain as pressure-like.  Chest pain was worse with exertion, but today she started having chest pains at rest.  No known relieving factors.  Duration of pain varies.  No associated difficulty breathing, nausea, vomiting or dizziness. She is on anticoagulation with Eliquis and compliant.  ED Course: Heart rate 55-63.  Otherwise stable vitals.   Troponin 7 > 9.  EKG shows sinus bradycardia, old RBBB otherwise unchanged.  Chest x-ray clear. EDP talked with Dr. Harl Bowie, recommended echo, will see in a.m. and determine need for further work-up.  Review of Systems: As per HPI all other systems reviewed and negative.  Past Medical History:  Diagnosis Date   Chronic back pain    Diabetes mellitus    Edema leg    Hyperlipidemia    Hypertension    Joint pain    Kidney stones    Other malaise and fatigue    PAF (paroxysmal atrial fibrillation) ([place])    a. new diagnosis in 02/2021 --> s/p DCCV on 03/18/2021   RBBB    Sinus bradycardia    Sleep apnea     Past Surgical History:  Procedure Laterality Date   BALLOON DILATION N/A 02/22/2022   Procedure: BALLOON DILATION;  Surgeon: Eloise Harman, DO;  Location: [facility];  Service: Endoscopy;  Laterality: N/A;   BIOPSY  02/22/2022   Procedure: BIOPSY;  Surgeon: Eloise Harman, DO;  Location: [facility];  Service:  Endoscopy;;   CARDIOVERSION N/A 03/18/2021   Procedure: CARDIOVERSION;  Surgeon: Satira Sark, MD;  Location: [facility];  Service: Cardiovascular;  Laterality: N/A;   CESAREAN SECTION     times 2   COLONOSCOPY WITH PROPOFOL N/A 02/22/2022   Procedure: COLONOSCOPY WITH PROPOFOL;  Surgeon: Eloise Harman, DO;  Location: [facility];  Service: Endoscopy;  Laterality: N/A;  7:30am   ESOPHAGOGASTRODUODENOSCOPY (EGD) WITH PROPOFOL N/A 02/22/2022   Procedure: ESOPHAGOGASTRODUODENOSCOPY (EGD) WITH PROPOFOL;  Surgeon: Eloise Harman, DO;  Location: [facility];  Service: Endoscopy;  Laterality: N/A;   INCISION AND DRAINAGE ABSCESS Right    axilla   LITHOTRIPSY     POLYPECTOMY  02/22/2022   Procedure: POLYPECTOMY;  Surgeon: Eloise Harman, DO;  Location: [facility];  Service: Endoscopy;;     reports that she has never smoked. She has never used smokeless tobacco. She reports that she does not drink alcohol and does not use drugs.  No Known Allergies  Family History  Problem Relation Age of Onset   Hypertension Mother    Cancer Father    Hypertension Father    Cancer Brother    Heart disease Paternal Grandmother    Breast cancer Neg Hx    Colon cancer Neg Hx    Prior to Admission medications   Medication Sig Start Date End Date Taking? Authorizing Provider  acetaminophen (TYLENOL) 500 MG tablet  Take 1,000 mg by mouth every 8 (eight) hours as needed for moderate pain.   Yes [provider]  albuterol (VENTOLIN HFA) 108 (90 Base) MCG/ACT inhaler Inhale 2 puffs into the lungs every 6 (six) hours as needed for wheezing or shortness of breath. 11/20/21  Yes Paseda, Dewaine Conger, FNP  amLODipine (NORVASC) 10 MG tablet TAKE ONE TABLET BY MOUTH DAILY 04/15/22  Yes Josue Hector, MD  ELIQUIS 5 MG TABS tablet TAKE ONE TABLET BY MOUTH TWICE DAILY 04/26/22  Yes Minus Breeding, MD  GNP VITAMIN D 25 MCG (1000 UT) tablet Take 1 capsule (1,000 Units total) by mouth daily. Patient taking  differently: Take 1,000 Units by mouth daily. 04/15/22  Yes Paseda, Dewaine Conger, FNP  lisinopril-hydrochlorothiazide (ZESTORETIC) 20-25 MG tablet Take 1 tablet by mouth daily. 12/24/21  Yes Paseda, Dewaine Conger, FNP  metoprolol succinate (TOPROL-XL) 50 MG 24 hr tablet TAKE ONE TABLET BY MOUTH DAILY. TAKE WITH OR IMMEDIATELY FOLLOWING A MEAL. Patient taking differently: Take 50 mg by mouth daily. 11/05/21  Yes Minus Breeding, MD  omeprazole (PRILOSEC) 40 MG capsule Take 1 capsule (40 mg total) by mouth daily. 02/22/22  Yes Paseda, Dewaine Conger, FNP  Plecanatide (TRULANCE) 3 MG TABS Take 3 mg by mouth daily. 01/13/22  Yes Mahala Menghini, PA-C  rosuvastatin (CRESTOR) 10 MG tablet Take 10 mg by mouth daily. 05/10/22  Yes [provider]  Semaglutide, 2 MG/DOSE, (OZEMPIC, 2 MG/DOSE,) 8 MG/3ML SOPN Inject 2 mg into the skin once a week. Thurday   Yes [provider]  blood glucose meter kit and supplies Dispense based on patient and insurance preference. Use up to four times daily as directed. (FOR ICD-10 E10.9, E11.9). 10/01/21   Brita Romp, NP  diclofenac Sodium (VOLTAREN) 1 % GEL Apply 2 g topically 4 (four) times daily. Patient not taking: Reported on 06/07/2022 12/26/21   Volney American, PA-C  promethazine-dextromethorphan (PROMETHAZINE-DM) 6.25-15 MG/5ML syrup Take 5 mLs by mouth 4 (four) times daily as needed. Patient not taking: Reported on 04/09/2022 03/20/22   Volney American, PA-C  rosuvastatin (CRESTOR) 20 MG tablet Take 1 tablet (20 mg total) by mouth daily. Patient not taking: Reported on 06/07/2022 05/11/22   Renee Rival, FNP  tirzepatide [facility]) 7.5 MG/0.5ML Pen Inject 7.5 mg into the skin once a week. Patient not taking: Reported on 06/07/2022 05/27/22   Brita Romp, NP  TRUE METRIX BLOOD GLUCOSE TEST test strip SMARTSIG:Via Meter 1-4 Times Daily 10/01/21   [provider]  TRUEplus Lancets 30G MISC USE UP TO FOUR TIMES DAILY AS DIRECTED  10/01/21   [provider]    Physical Exam: Vitals:   06/07/22 1417 06/07/22 1419 06/07/22 1621 06/07/22 1837  BP:  (!) 157/80 (!) 175/71 134/78  Pulse:  (!) 55 63 61  Resp:  '18 16 16  ' Temp:  97.6 F (36.4 C) 97.7 F (36.5 C) 98 F (36.7 C)  TempSrc:  Oral Oral Oral  SpO2:  99% 100% 99%  Weight: 108.9 kg     Height: '5\' 3"'  (1.6 m)       Constitutional: NAD, calm, comfortable Vitals:   06/07/22 1417 06/07/22 1419 06/07/22 1621 06/07/22 1837  BP:  (!) 157/80 (!) 175/71 134/78  Pulse:  (!) 55 63 61  Resp:  '18 16 16  ' Temp:  97.6 F (36.4 C) 97.7 F (36.5 C) 98 F (36.7 C)  TempSrc:  Oral Oral Oral  SpO2:  99% 100% 99%  Weight: 108.9 kg     Height: '5\' 3"'  (1.6 m)      Eyes: PERRL, lids and conjunctivae normal ENMT: Mucous membranes are moist.   Neck: normal, supple, no masses, no thyromegaly Respiratory: clear to auscultation bilaterally, no wheezing, no crackles. Normal respiratory effort. No accessory muscle use.  Cardiovascular: Regular rate and rhythm, no murmurs / rubs / gallops. No extremity edema.  Extremities warm. Abdomen: no tenderness, no masses palpated. No hepatosplenomegaly. Bowel sounds positive.  Musculoskeletal: no clubbing / cyanosis. No joint deformity upper and lower extremities. Good ROM, no contractures. Normal muscle tone.  Skin: no rashes, lesions, ulcers. No induration Neurologic: No apparent cranial nerve abnormality, moving extremities spontaneously. Psychiatric: Normal judgment and insight. Alert and oriented x 3. Normal mood.   Labs on Admission: I have personally reviewed following labs and imaging studies  CBC: Recent Labs  Lab 06/07/22 1439  WBC 8.1  HGB 13.7  HCT 41.8  MCV 89.7  PLT 637   Basic Metabolic Panel: Recent Labs  Lab 06/07/22 1439  NA 139  K 3.7  CL 106  CO2 26  GLUCOSE 101*  BUN 22*  CREATININE 0.92  CALCIUM 10.0    Radiological Exams on Admission: DG Chest 2 View  Result Date: 06/07/2022 CLINICAL  DATA:  Dyspnea, worsening today, atrial fibrillation EXAM: CHEST - 2 VIEW COMPARISON:  03/18/2021 chest radiograph. FINDINGS: Stable cardiomediastinal silhouette with normal heart size. No pneumothorax. No pleural effusion. Lungs appear clear, with no acute consolidative airspace disease and no pulmonary edema. IMPRESSION: No active cardiopulmonary disease. Electronically Signed   By: Ilona Sorrel M.D.   On: 06/07/2022 14:51    EKG: Independently reviewed.  Sinus bradycardia rate 57, QTc 445.  Old RBBB.  No significant change from prior.  Assessment/Plan Principal Problem:   Chest pain Active Problems:   Paroxysmal atrial fibrillation (HCC)   Obesity, Class III, BMI 40-49.9 (morbid obesity) (HCC)   Diabetes (HCC)   HTN (hypertension)   OSA (obstructive sleep apnea)   Assessment and Plan: * Chest pain Exertional chest pain.  With typical and atypical features, with risk factors for CAD diabetes and hypertension, age.  Never smoked cigarettes.  Troponin 7> 9,  EKG unremarkable.  Stress test 04/02/2021 -low risk study, no perfusion deficits. - EDP talked cardiology, Dr. Harl Bowie, will see in a.m. -Obtain echo -N.p.o. midnight -Aspirin 386m x 1 given ibn ED - Continue Eliquis, statins, metoprolol  HTN (hypertension) Stable. -Resume metoprolol, Norvasc, lisinopril HCTZ  Diabetes (HCC) A1c 6.6. - SSI- M -Hold Plecanatide, semaglutide tirzepatide injections once weekly  Obesity, Class III, BMI 40-49.9 (morbid obesity) (HCC) BMI- 42.51.  Paroxysmal atrial fibrillation (HCC) Rate controlled and on anticoagulation. -Resume metoprolol and Eliquis.   DVT prophylaxis: Eliquis Code Status: Full Family Communication: Spouse at bedside Disposition Plan: ~ 1 -2 days Consults called: CArds Admission status: Obs tele  Author: EBethena Roys MD 06/07/2022 8:54 PM  For on call review [email or web address]

## 2022-06-07 NOTE — ED Triage Notes (Signed)
Pt presents with concern for being in afib. Chest pressure, fatigue, and feeling short of breath that started yesterday.

## 2022-06-07 NOTE — Assessment & Plan Note (Signed)
A1c 6.6. - SSI- -Hold Plecanatide, semaglutide tirzepatide injections once weekly

## 2022-06-07 NOTE — Assessment & Plan Note (Addendum)
Troponin 7> 9,  EKG unremarkable.  Stress test 04/02/2021 -low risk study, no perfusion deficits. - concerned about accelerated angina -9/19 Echo EF 65-70%, no WMA, G1DD -continue ASA -appreciate cardiology>>transfer to [facility] for LHC -continue statin, metoprolol succinate

## 2022-06-07 NOTE — ED Provider Notes (Signed)
Patient presents to urgent care today with 3 days of worsening chest pain and shortness of breath.  Patient denies dizziness or lightheadedness.  Reports her vision is a little bit blurry.  Denies weakness, diaphoresis.  No cough or congestion recently.  Reports has been feeling very tired.  EKG today shows normal sinus rhythm; possible change in V2 lead.  Patient has a history of atrial fibrillation, does not appear to be in A-fib today.  Given history, symptoms, I recommended further evaluation in the emergency room for full cardiac work-up.  Patient is in agreement to this plan.  I recommended patient call a family member or friend to transport her to the hospital.  She is stable to transport via private vehicle at this time.   Eulogio Bear, NP 06/07/22 1352

## 2022-06-07 NOTE — Discharge Instructions (Signed)
Please go directly to the emergency room for further evaluation of the chest pain

## 2022-06-07 NOTE — ED Notes (Signed)
Patient is being discharged from the Urgent Care and sent to the Emergency Department via POV . Per Noemi Chapel, NP, patient is in need of higher level of care due to chest pain. Patient is aware and verbalizes understanding of plan of care.  Vitals:   06/07/22 1339  BP: 125/61  Pulse: (!) 57  Resp: 18  Temp: 97.6 F (36.4 C)  SpO2: 97%

## 2022-06-07 NOTE — ED Notes (Signed)
Pt in bed, pt offers no complaints at this time, pt awaits admission bed

## 2022-06-07 NOTE — ED Notes (Addendum)
Pt ambulatory to bathroom with steady gait.

## 2022-06-07 NOTE — Assessment & Plan Note (Addendum)
Rate controlled and on anticoagulation. -Resume metoprolol -holding apixaban for heart cath -currently in sinus

## 2022-06-07 NOTE — ED Provider Notes (Signed)
[facility] EMERGENCY DEPARTMENT Provider Note   CSN: [id number] Arrival date & time: 06/07/22  1406     History  Chief Complaint  Patient presents with   Chest Pain    Kathryn Merritt is a 61 y.o. female.   Chest Pain  This patient is a 61 year old female with history of hypertension on amlodipine and lisinopril as well as hydrochlorothiazide and metoprolol.  She has a history of atrial fibrillation taking Eliquis twice a day and she is a diabetic who is currently on Ozempic.  The patient reports that she has been having some chest pain over the last multiple days, approximately 4 or 5 days.  Seems to be middle of the chest, heavy in nature and seems to radiate to the back and under the right arm.  This does get worse with exertion and over the last day this has been more constant.  It does get worse with exertion better with rest and is not associated with nausea vomiting or shortness of breath.  The patient denies swelling of her legs, she does not take any aspirin, she is good about taking her Eliquis.  Review of the medical record shows that in July 2022 she had a stress test, there was no ST segment deviation, this was a normal study with an ejection fraction greater than 65%  Echocardiogram was performed June 2022 and showed an ejection fraction of 65 to 70% with moderate left ventricular hypertrophy but no diastolic dysfunction  The patient is having active mild chest pain at this time.  She is not having any coughing fevers or nausea vomiting or diarrhea.    Home Medications Prior to Admission medications   Medication Sig Start Date End Date Taking? Authorizing Provider  acetaminophen (TYLENOL) 500 MG tablet Take 1,000 mg by mouth every 8 (eight) hours as needed for moderate pain.    [provider]  albuterol (VENTOLIN HFA) 108 (90 Base) MCG/ACT inhaler Inhale 2 puffs into the lungs every 6 (six) hours as needed for wheezing or shortness of breath. 11/20/21   Renee Rival, FNP  amLODipine (NORVASC) 10 MG tablet TAKE ONE TABLET BY MOUTH DAILY 04/15/22   Josue Hector, MD  blood glucose meter kit and supplies Dispense based on patient and insurance preference. Use up to four times daily as directed. (FOR ICD-10 E10.9, E11.9). 10/01/21   Brita Romp, NP  diclofenac Sodium (VOLTAREN) 1 % GEL Apply 2 g topically 4 (four) times daily. 12/26/21   Volney American, PA-C  ELIQUIS 5 MG TABS tablet TAKE ONE TABLET BY MOUTH TWICE DAILY 04/26/22   Minus Breeding, MD  GNP VITAMIN D 25 MCG (1000 UT) tablet Take 1 capsule (1,000 Units total) by mouth daily. 04/15/22   Renee Rival, FNP  lisinopril-hydrochlorothiazide (ZESTORETIC) 20-25 MG tablet Take 1 tablet by mouth daily. 12/24/21   Paseda, Dewaine Conger, FNP  metoprolol succinate (TOPROL-XL) 50 MG 24 hr tablet TAKE ONE TABLET BY MOUTH DAILY. TAKE WITH OR IMMEDIATELY FOLLOWING A MEAL. 11/05/21   Minus Breeding, MD  omeprazole (PRILOSEC) 40 MG capsule Take 1 capsule (40 mg total) by mouth daily. 02/22/22   Paseda, Dewaine Conger, FNP  Plecanatide (TRULANCE) 3 MG TABS Take 3 mg by mouth daily. 01/13/22   Mahala Menghini, PA-C  promethazine-dextromethorphan (PROMETHAZINE-DM) 6.25-15 MG/5ML syrup Take 5 mLs by mouth 4 (four) times daily as needed. Patient not taking: Reported on 04/09/2022 03/20/22   Volney American, PA-C  rosuvastatin ([place]) 20  MG tablet Take 1 tablet (20 mg total) by mouth daily. 05/11/22   Paseda, Dewaine Conger, FNP  tirzepatide [facility]) 7.5 MG/0.5ML Pen Inject 7.5 mg into the skin once a week. 05/27/22   Brita Romp, NP  TRUE METRIX BLOOD GLUCOSE TEST test strip SMARTSIG:Via Meter 1-4 Times Daily 10/01/21   [provider]  TRUEplus Lancets 30G MISC USE UP TO FOUR TIMES DAILY AS DIRECTED 10/01/21   [provider]      Allergies    Patient has no known allergies.    Review of Systems   Review of Systems  Cardiovascular:  Positive for chest pain.  All other  systems reviewed and are negative.   Physical Exam Updated Vital Signs BP (!) 157/80 (BP Location: Left Arm)   Pulse (!) 55   Temp 97.6 F (36.4 C) (Oral)   Resp 18   Ht 1.6 m ('5\' 3"' )   Wt 108.9 kg   SpO2 99%   BMI 42.51 kg/m  Physical Exam Vitals and nursing note reviewed.  Constitutional:      General: She is not in acute distress.    Appearance: She is well-developed.  HENT:     Head: Normocephalic and atraumatic.     Mouth/Throat:     Pharynx: No oropharyngeal exudate.  Eyes:     General: No scleral icterus.       Right eye: No discharge.        Left eye: No discharge.     Conjunctiva/sclera: Conjunctivae normal.     Pupils: Pupils are equal, round, and reactive to light.  Neck:     Thyroid: No thyromegaly.     Vascular: No JVD.  Cardiovascular:     Rate and Rhythm: Normal rate and regular rhythm.     Heart sounds: Normal heart sounds. No murmur heard.    No friction rub. No gallop.  Pulmonary:     Effort: Pulmonary effort is normal. No respiratory distress.     Breath sounds: Normal breath sounds. No wheezing or rales.  Abdominal:     General: Bowel sounds are normal. There is no distension.     Palpations: Abdomen is soft. There is no mass.     Tenderness: There is no abdominal tenderness.  Musculoskeletal:        General: No tenderness. Normal range of motion.     Cervical back: Normal range of motion and neck supple.  Lymphadenopathy:     Cervical: No cervical adenopathy.  Skin:    General: Skin is warm and dry.     Findings: No erythema or rash.  Neurological:     Mental Status: She is alert.     Coordination: Coordination normal.  Psychiatric:        Behavior: Behavior normal.     ED Results / Procedures / Treatments   Labs (all labs ordered are listed, but only abnormal results are displayed) Labs Reviewed  BASIC METABOLIC PANEL - Abnormal; Notable for the following components:      Result Value   Glucose, Bld 101 (*)    BUN 22 (*)    All  other components within normal limits  CBC  TROPONIN I (HIGH SENSITIVITY)  TROPONIN I (HIGH SENSITIVITY)    EKG EKG Interpretation  Date/Time:  Monday June 07 2022 14:20:41 EDT Ventricular Rate:  57 PR Interval:  136 QRS Duration: 132 QT Interval:  458 QTC Calculation: 445 R Axis:   73 Text Interpretation: Sinus bradycardia Right bundle branch  block Possible Lateral infarct , age undetermined Abnormal ECG When compared with ECG of 07-Jun-2022 13:44, No significant change was found Confirmed by Noemi Chapel [phone]) on 06/07/2022 2:50:08 PM  Radiology DG Chest 2 View  Result Date: 06/07/2022 CLINICAL DATA:  Dyspnea, worsening today, atrial fibrillation EXAM: CHEST - 2 VIEW COMPARISON:  03/18/2021 chest radiograph. FINDINGS: Stable cardiomediastinal silhouette with normal heart size. No pneumothorax. No pleural effusion. Lungs appear clear, with no acute consolidative airspace disease and no pulmonary edema. IMPRESSION: No active cardiopulmonary disease. Electronically Signed   By: Ilona Sorrel M.D.   On: 06/07/2022 14:51    Procedures Procedures    Medications Ordered in ED Medications  aspirin chewable tablet 324 mg (has no administration in time range)    ED Course/ Medical Decision Making/ A&P                           Medical Decision Making Amount and/or Complexity of Data Reviewed Labs: ordered. Radiology: ordered.  Risk OTC drugs. Decision regarding hospitalization.   This patient presents to the ED for concern of chest pain, this involves an extensive number of treatment options, and is a complaint that carries with it a high risk of complications and morbidity.  The differential diagnosis includes less likely to be pulmonary embolism given treatment with Eliquis, unlikely to be dissection given the nature of the pain and the escalating symptoms over days, would consider acute coronary syndrome or unstable angina though EKG shows right bundle branch block  without ST segment changes.   Co morbidities that complicate the patient evaluation  Hypertension, hypercholesterolemia, overweight, diabetes   Additional history obtained:  Additional history obtained from [place] record External records from outside source obtained and reviewed including see above   Lab Tests:  I Ordered, and personally interpreted labs.  The pertinent results include: CBC metabolic panel and troponin are rather unremarkable   Imaging Studies ordered:  I ordered imaging studies including chest x-ray I independently visualized and interpreted imaging which showed no acute findings I agree with the radiologist interpretation   Cardiac Monitoring: / EKG:  The patient was maintained on a cardiac monitor.  I personally viewed and interpreted the cardiac monitored which showed an underlying rhythm of: Normal sinus rhythm with a right bundle branch block, no arrhythmia   Consultations Obtained:  I requested consultation with the Dr. Harl Bowie with cardiology, they want the patient to be admitted for an echocardiogram and cardiology consultation,  and discussed lab and imaging findings as well as pertinent plan - they recommend: Hospitalist recommends admission and agreeable   Problem List / ED Course / Critical interventions / Medication management  Aspirin given, kept on cardiac monitor Unstable angina given pattern of symptoms Thank you no signs of prior obstructive disease and negative stress test 1 year ago however symptoms worsened today I ordered medication including aspirin for antiplatelet therapy Reevaluation of the patient after these medicines showed that the patient gradually improved I have reviewed the patients home medicines and have made adjustments as needed   Social Determinants of Health:  None   Test / Admission - Considered:  Will admit         Final Clinical Impression(s) / ED Diagnoses Final diagnoses:  Unstable  angina [facility])  Essential hypertension    Rx / DC Orders ED Discharge Orders     None         Noemi Chapel, MD 06/07/22 [phone]

## 2022-06-07 NOTE — Assessment & Plan Note (Signed)
Stable. -Resume metoprolol, Norvasc, lisinopril HCTZ

## 2022-06-07 NOTE — ED Notes (Signed)
Report called to 300, pt awaits transport to floor

## 2022-06-07 NOTE — Assessment & Plan Note (Signed)
BMI 42.51   

## 2022-06-08 ENCOUNTER — Observation Stay (HOSPITAL_BASED_OUTPATIENT_CLINIC_OR_DEPARTMENT_OTHER): Payer: 59

## 2022-06-08 DIAGNOSIS — E669 Obesity, unspecified: Secondary | ICD-10-CM | POA: Diagnosis not present

## 2022-06-08 DIAGNOSIS — Z7901 Long term (current) use of anticoagulants: Secondary | ICD-10-CM | POA: Diagnosis not present

## 2022-06-08 DIAGNOSIS — Z79899 Other long term (current) drug therapy: Secondary | ICD-10-CM | POA: Diagnosis not present

## 2022-06-08 DIAGNOSIS — R079 Chest pain, unspecified: Secondary | ICD-10-CM

## 2022-06-08 DIAGNOSIS — G4733 Obstructive sleep apnea (adult) (pediatric): Secondary | ICD-10-CM

## 2022-06-08 DIAGNOSIS — I480 Paroxysmal atrial fibrillation: Secondary | ICD-10-CM | POA: Diagnosis not present

## 2022-06-08 DIAGNOSIS — I200 Unstable angina: Secondary | ICD-10-CM | POA: Diagnosis not present

## 2022-06-08 DIAGNOSIS — E119 Type 2 diabetes mellitus without complications: Secondary | ICD-10-CM | POA: Diagnosis not present

## 2022-06-08 DIAGNOSIS — Z6841 Body Mass Index (BMI) 40.0 and over, adult: Secondary | ICD-10-CM | POA: Diagnosis not present

## 2022-06-08 DIAGNOSIS — I10 Essential (primary) hypertension: Secondary | ICD-10-CM | POA: Diagnosis not present

## 2022-06-08 LAB — ECHOCARDIOGRAM COMPLETE
AR max vel: 1.59 cm2
AV Area VTI: 1.83 cm2
AV Area mean vel: 1.76 cm2
AV Mean grad: 9.5 mmHg
AV Peak grad: 20 mmHg
Ao pk vel: 2.24 m/s
Area-P 1/2: 2.31 cm2
Height: 63 in
MV VTI: 2.37 cm2
S' Lateral: 2.6 cm
Weight: 3837.77 oz

## 2022-06-08 LAB — GLUCOSE, CAPILLARY
Glucose-Capillary: 128 mg/dL — ABNORMAL HIGH (ref 70–99)
Glucose-Capillary: 129 mg/dL — ABNORMAL HIGH (ref 70–99)
Glucose-Capillary: 180 mg/dL — ABNORMAL HIGH (ref 70–99)
Glucose-Capillary: 98 mg/dL (ref 70–99)

## 2022-06-08 LAB — HIV ANTIBODY (ROUTINE TESTING W REFLEX): HIV Screen 4th Generation wRfx: NONREACTIVE

## 2022-06-08 MED ORDER — ASPIRIN 81 MG PO TBEC: 81.0000 mg | DELAYED_RELEASE_TABLET | Freq: Every day | ORAL | Status: DC

## 2022-06-08 MED ORDER — SODIUM CHLORIDE 0.9% FLUSH
3.0000 mL | Freq: Two times a day (BID) | INTRAVENOUS | Status: DC
  Administered 2022-06-08 – 2022-06-09 (×3): 3 mL via INTRAVENOUS

## 2022-06-08 MED ORDER — PLECANATIDE 3 MG PO TABS: 3.0000 mg | ORAL_TABLET | Freq: Every day | ORAL | Status: DC

## 2022-06-08 NOTE — Progress Notes (Signed)
Patient placed on Auto CPAP with nasal mask.  Tolerating well at this time

## 2022-06-08 NOTE — Assessment & Plan Note (Signed)
CPAP nightly

## 2022-06-08 NOTE — Consult Note (Addendum)
Cardiology Consultation   Patient ID: SHAMICKA INGA MRN: [id number]; DOB: 17-Aug-1961  Admit date: 06/07/2022 Date of Consult: 06/08/2022  PCP:  Renee Rival, [facility] Cardiologist:  Minus Breeding, MD  Sleep Medicine:  Fransico Him, MD       Patient Profile:   Kathryn Merritt is a 61 y.o. female with a hx of persistent atrial fibrillation (s/p DCCV in 02/2021, recurrence in 11/2021 with successful DCCV while in the ED), OSA, HTN, HLD, Type 2 DM and GERD who is being seen 06/08/2022 for the evaluation of chest pain at the request of Dr. Wynetta Emery.  History of Present Illness:   Kathryn Merritt was last examined by Dr. Percival Spanish in 11/2021 and had recently been diagnosed with recurrent atrial fibrillation earlier that month and underwent successful cardioversion in the ED. She denied any anginal symptoms at that time. Ablation was discussed but she wished to think about it further and messages were sent to the sleep team to expedite her CPAP. In the interim, this has been followed by Dr. Radford Pax and at the time of her visit in 03/2022 she was 87% compliant with use of more than 4 hours nightly.  She initially presented to [facility] on 06/07/2022 for evaluation of dyspnea, chest pressure and fatigue. EKG showed normal sinus rhythm and given her symptoms, it was recommended to proceed to the ED for further evaluation.   Initial labs show WBC 8.1, Hgb 13.7, platelets 249, Na+ 139, K+ 3.7 and creatinine 0.92. Initial Hs Troponin 7 with repeat at 9. CXR with no active cardiopulmonary disease. EKG shows NSR, HR 60 with known RBBB.   In talking with the patient and her husband today, she reports having progressive chest pain and dyspnea on exertion for the past week. She works at a daycare and reports discomfort along her sternal region which has occurred with walking or doing minimal activity and resolves with rest. No association with positional changes or  food consumption. She did try taking Tylenol at times with no improvement in symptoms.  Reports her pain overall feels different from when in atrial fibrillation in the past. No recent orthopnea, PND or pitting edema. Still having some difficulty with her CPAP. No recent travel. She denies any pain at this current time but has not been active since admission.    Past Medical History:  Diagnosis Date   Chronic back pain    Diabetes mellitus    Edema leg    Hyperlipidemia    Hypertension    Joint pain    Kidney stones    Other malaise and fatigue    PAF (paroxysmal atrial fibrillation) ([place])    a. new diagnosis in 02/2021 --> s/p DCCV on 03/18/2021   RBBB    Sinus bradycardia    Sleep apnea     Past Surgical History:  Procedure Laterality Date   BALLOON DILATION N/A 02/22/2022   Procedure: BALLOON DILATION;  Surgeon: Eloise Harman, DO;  [facility];  Service: Endoscopy;  Laterality: N/A;   BIOPSY  02/22/2022   Procedure: BIOPSY;  Surgeon: Eloise Harman, DO;  [facility];  Service: Endoscopy;;   CARDIOVERSION N/A 03/18/2021   Procedure: CARDIOVERSION;  Surgeon: Satira Sark, MD;  Location: [facility];  Service: Cardiovascular;  Laterality: N/A;   CESAREAN SECTION     times 2   COLONOSCOPY WITH PROPOFOL N/A 02/22/2022   Procedure: COLONOSCOPY WITH PROPOFOL;  Surgeon:  Eloise Harman, DO;  [facility];  Service: Endoscopy;  Laterality: N/A;  7:30am   ESOPHAGOGASTRODUODENOSCOPY (EGD) WITH PROPOFOL N/A 02/22/2022   Procedure: ESOPHAGOGASTRODUODENOSCOPY (EGD) WITH PROPOFOL;  Surgeon: Eloise Harman, DO;  [facility];  Service: Endoscopy;  Laterality: N/A;   INCISION AND DRAINAGE ABSCESS Right    axilla   LITHOTRIPSY     POLYPECTOMY  02/22/2022   Procedure: POLYPECTOMY;  Surgeon: Eloise Harman, DO;  [facility];  Service: Endoscopy;;     Home Medications:  Prior to Admission medications   Medication Sig Start Date  End Date Taking? Authorizing Provider  acetaminophen (TYLENOL) 500 MG tablet Take 1,000 mg by mouth every 8 (eight) hours as needed for moderate pain.   Yes [provider]  albuterol (VENTOLIN HFA) 108 (90 Base) MCG/ACT inhaler Inhale 2 puffs into the lungs every 6 (six) hours as needed for wheezing or shortness of breath. 11/20/21  Yes Paseda, Dewaine Conger, FNP  amLODipine (NORVASC) 10 MG tablet TAKE ONE TABLET BY MOUTH DAILY 04/15/22  Yes Josue Hector, MD  ELIQUIS 5 MG TABS tablet TAKE ONE TABLET BY MOUTH TWICE DAILY 04/26/22  Yes Minus Breeding, MD  GNP VITAMIN D 25 MCG (1000 UT) tablet Take 1 capsule (1,000 Units total) by mouth daily. Patient taking differently: Take 1,000 Units by mouth daily. 04/15/22  Yes Paseda, Dewaine Conger, FNP  lisinopril-hydrochlorothiazide (ZESTORETIC) 20-25 MG tablet Take 1 tablet by mouth daily. 12/24/21  Yes Paseda, Dewaine Conger, FNP  metoprolol succinate (TOPROL-XL) 50 MG 24 hr tablet TAKE ONE TABLET BY MOUTH DAILY. TAKE WITH OR IMMEDIATELY FOLLOWING A MEAL. Patient taking differently: Take 50 mg by mouth daily. 11/05/21  Yes Minus Breeding, MD  omeprazole (PRILOSEC) 40 MG capsule Take 1 capsule (40 mg total) by mouth daily. 02/22/22  Yes Paseda, Dewaine Conger, FNP  Plecanatide (TRULANCE) 3 MG TABS Take 3 mg by mouth daily. 01/13/22  Yes Mahala Menghini, PA-C  rosuvastatin (CRESTOR) 10 MG tablet Take 10 mg by mouth daily. 05/10/22  Yes [provider]  Semaglutide, 2 MG/DOSE, (OZEMPIC, 2 MG/DOSE,) 8 MG/3ML SOPN Inject 2 mg into the skin once a week. Thurday   Yes [provider]  blood glucose meter kit and supplies Dispense based on patient and insurance preference. Use up to four times daily as directed. (FOR ICD-10 E10.9, E11.9). 10/01/21   Brita Romp, NP  diclofenac Sodium (VOLTAREN) 1 % GEL Apply 2 g topically 4 (four) times daily. Patient not taking: Reported on 06/07/2022 12/26/21   Volney American, PA-C   promethazine-dextromethorphan (PROMETHAZINE-DM) 6.25-15 MG/5ML syrup Take 5 mLs by mouth 4 (four) times daily as needed. Patient not taking: Reported on 04/09/2022 03/20/22   Volney American, PA-C  rosuvastatin (CRESTOR) 20 MG tablet Take 1 tablet (20 mg total) by mouth daily. Patient not taking: Reported on 06/07/2022 05/11/22   Renee Rival, FNP  tirzepatide [facility]) 7.5 MG/0.5ML Pen Inject 7.5 mg into the skin once a week. Patient not taking: Reported on 06/07/2022 05/27/22   Brita Romp, NP  TRUE METRIX BLOOD GLUCOSE TEST test strip SMARTSIG:Via Meter 1-4 Times Daily 10/01/21   [provider]  TRUEplus Lancets 30G MISC USE UP TO FOUR TIMES DAILY AS DIRECTED 10/01/21   [provider]    Inpatient Medications: Scheduled Meds:  amLODipine  10 mg Oral Daily   apixaban  5 mg Oral BID   lisinopril  20 mg Oral Daily  And   hydrochlorothiazide  25 mg Oral Daily   insulin aspart  0-15 Units Subcutaneous Q6H   metoprolol succinate  50 mg Oral Daily   pantoprazole  40 mg Oral Daily   rosuvastatin  10 mg Oral Daily   Continuous Infusions:  PRN Meds: acetaminophen **OR** acetaminophen, ondansetron **OR** ondansetron (ZOFRAN) IV, polyethylene glycol  Allergies:   No Known Allergies  Social History:   Social History   Socioeconomic History   Marital status: Married    Spouse name: Not on file   Number of children: 2   Years of education: Not on file   Highest education level: Not on file  Occupational History   Occupation: Occupational psychologist- Teacher  Tobacco Use   Smoking status: Never   Smokeless tobacco: Never  Vaping Use   Vaping Use: Never used  Substance and Sexual Activity   Alcohol use: No   Drug use: No   Sexual activity: Not Currently  Other Topics Concern   Not on file  Social History Narrative   Live with her husband, works at [employer] center,    Social Determinants of  Strain:  Not on file  Food Insecurity: No [place] (06/08/2022)   Hunger Vital Sign    Worried About Running Out of Food in the Last Year: Never true    Ran Out of Food in the Last Year: Never true  Transportation Needs: No Transportation Needs (06/08/2022)   PRAPARE - Hydrologist (Medical): No    Lack of Transportation (Non-Medical): No  Physical Activity: Not on file  Stress: Not on file  Social Connections: Not on file  Intimate Partner Violence: Not At Risk (06/08/2022)   Humiliation, Afraid, Rape, and Kick questionnaire    Fear of Current or Ex-Partner: No    Emotionally Abused: No    Physically Abused: No    Sexually Abused: No    Family History:    Family History  Problem Relation Age of Onset   Hypertension Mother    Cancer Father    Hypertension Father    Cancer Brother    Heart disease Paternal Grandmother    Breast cancer Neg Hx    Colon cancer Neg Hx      ROS:  Please see the history of present illness.   All other ROS reviewed and negative.     Physical Exam/Data:   Vitals:   06/07/22 2217 06/08/22 0045 06/08/22 0209 06/08/22 0421  BP: 138/74  130/76 128/73  Pulse: 63 64 (!) 57 68  Resp: '16 14 19 16  ' Temp: 97.9 F (36.6 C)  98.7 F (37.1 C) (!) 96.9 F (36.1 C)  TempSrc: Oral     SpO2: 98% 98% 99% 98%  Weight: 108.8 kg     Height:       No intake or output data in the 24 hours ending 06/08/22 0944    06/07/2022   10:17 PM 06/07/2022    2:17 PM 04/14/2022    3:27 PM  Last 3 Weights  Weight (lbs) 239 lb 13.8 oz 240 lb 247 lb  Weight (kg) 108.8 kg 108.863 kg 112.038 kg     Body mass index is 42.49 kg/m.  General:  Pleasant female appearing in no acute distress.  HEENT: normal Neck: no JVD Vascular: No carotid bruits; Distal pulses 2+ bilaterally Cardiac:  normal S1, S2; RRR; no murmur Lungs:  clear to auscultation bilaterally, no  wheezing, rhonchi or rales  Abd: soft, nontender, no hepatomegaly  Ext: no pitting  edema Musculoskeletal:  No deformities, BUE and BLE strength normal and equal Skin: warm and dry  Neuro:  CNs 2-12 intact, no focal abnormalities noted Psych:  Normal affect   EKG:  The EKG was personally reviewed and demonstrates: NSR, HR 60 with known RBBB.   Telemetry:  Telemetry was personally reviewed and demonstrates: Sinus bradycardia, HR in 50's to 60's. Occasional PVC's.    Relevant CV Studies:  Echocardiogram: 02/2021 IMPRESSIONS     1. Left ventricular ejection fraction, by estimation, is 65 to 70%. The  left ventricle has normal function. The left ventricle has no regional  wall motion abnormalities. There is moderate left ventricular hypertrophy.  Left ventricular diastolic  parameters were normal.   2. Right ventricular systolic function is normal. The right ventricular  size is normal. There is normal pulmonary artery systolic pressure. The  estimated right ventricular systolic pressure is 94.0 mmHg.   3. Left atrial size was mildly dilated.   4. The mitral valve is grossly normal. Trivial mitral valve  regurgitation.   5. The aortic valve is tricuspid. Aortic valve regurgitation is not  visualized.   6. The inferior vena cava is normal in size with greater than 50%  respiratory variability, suggesting right atrial pressure of 3 mmHg.    NST: 03/2021 There was no ST segment deviation noted during stress. The study is normal.There are no perfusion defects This is a low risk study. The left ventricular ejection fraction is hyperdynamic (>65%).  Laboratory Data:  High Sensitivity Troponin:   Recent Labs  Lab 06/07/22 1439 06/07/22 1630  TROPONINIHS 7 9     Chemistry Recent Labs  Lab 06/07/22 1439  NA 139  K 3.7  CL 106  CO2 26  GLUCOSE 101*  BUN 22*  CREATININE 0.92  CALCIUM 10.0  GFRNONAA >60  ANIONGAP 7    No results for input(s): "PROT", "ALBUMIN", "AST", "ALT", "ALKPHOS", "BILITOT" in the last 168 hours. Lipids No results for input(s):  "CHOL", "TRIG", "HDL", "LABVLDL", "LDLCALC", "CHOLHDL" in the last 168 hours.  Hematology Recent Labs  Lab 06/07/22 1439  WBC 8.1  RBC 4.66  HGB 13.7  HCT 41.8  MCV 89.7  MCH 29.4  MCHC 32.8  RDW 14.6  PLT 249   Thyroid No results for input(s): "TSH", "FREET4" in the last 168 hours.  BNPNo results for input(s): "BNP", "PROBNP" in the last 168 hours.  DDimer No results for input(s): "DDIMER" in the last 168 hours.   Radiology/Studies:  DG Chest 2 View  Result Date: 06/07/2022 CLINICAL DATA:  Dyspnea, worsening today, atrial fibrillation EXAM: CHEST - 2 VIEW COMPARISON:  03/18/2021 chest radiograph. FINDINGS: Stable cardiomediastinal silhouette with normal heart size. No pneumothorax. No pleural effusion. Lungs appear clear, with no acute consolidative airspace disease and no pulmonary edema. IMPRESSION: No active cardiopulmonary disease. Electronically Signed   By: Ilona Sorrel M.D.   On: 06/07/2022 14:51     Assessment and Plan:   1. Chest Pain concerning for Accelerating Angina - Presented with worsening chest pain with exertion for the past 1-2 weeks and resolves with rest. Currently pain-free.  - Hs Troponin values have been negative at 7 and 9. EKG shows NSR, HR 60 with known RBBB. Echocardiogram is pending.  - Will review with Dr. Harl Bowie but would anticipate a cardiac catheterization for definitive evaluation given her presenting symptoms and cardiac risk factors. If not, can obtain  a repeat Lexiscan Myoview but she did have a low-risk NST in 03/2021 and the sensitivity of this would be limited by her body habitus. I have asked her nurse to hold her AM dose of Eliquis as a catheterization would not be until 9/20 given she received Eliquis on 9/18.  2. Persistent Atrial Fibrillation - She underwent DCCV in 02/2021 and had recurrence in 11/2021 with successful DCCV while in the ED. Maintaining NSR this admission. Continue Toprol-XL 68m daily.  - She was on Eliquis prior to  admission. Reviewed with Dr. BHarl Bowieand will hold Eliquis for now in anticipation of cath. No indication for bridging.   3. OSA - Continued compliance with CPAP encouraged.   4. HTN - BP has been variable from 116/75 - 175/71, at 140/68 on most recent check. She is due for her AM medications. Continue Amlodipine, Toprol-XL and Lisinopril. Will hold HCTZ tomorrow for likely catheterization.   5. HLD - LDL was at 64 in 09/2021. Recheck FLP. Continue Crestor 186mdaily.    For questions or updates, please contact [place] consult [email or web address] for contact info under    Signed, BrErma HeritagePA-C  06/08/2022 9:44 AM   Attending note  Patient seen and discussed with PA StAhmed PrimaI agree with her documentation. [age]o female history of PAF on eliquis, DM2, HTN, OSA, presents with chest pain. Reports progression exertional chest pain and SOB/DOE. Heaviness midchest with SOB comes on with mild exertion, resolves within a few minutes of rest.      K 3.7 BUN 22 Cr 0.92 WBC 8.1 Hgb 13.7 Plt 249  Hstrop 7-->9 EKG SR, RBBB, no acute ischemic changes CXR no acute process Echo LVEF 65-70%, no WMAs  03/2021 nuclear stress: no ischemia   Patient presents with progressing exertional chest pain. No objective evidence of ACS by EKG, enzymes. Echo with normal LVEF and wall motion. Had a nuclear stress last year without ischemia. Current symptoms are new compared to last year, and worrisome for progressing unstable angina. She has multiple CAD risk factors including DM2, HTN, HL.  Body habitus limits utility of noninvasive options, I think best best would be a definitive evaluation with cath given her risk factors and progressive symptoms.   [facility]. Last dose of eliquis was Monday night, on hold.   Shared Decision Making/Informed Consent The risks [stroke (1 in 1000), death (1 in 1000), kidney failure  [usually temporary] (1 in 500), bleeding (1 in 200), allergic reaction [possibly serious] (1 in 200)], benefits (diagnostic support and management of coronary artery disease) and alternatives of a cardiac catheterization were discussed in detail with Kathryn Merritt and she is willing to proceed.   JoCarlyle DollyD

## 2022-06-08 NOTE — TOC Progression Note (Signed)
Transition of Care [facility]) - Progression Note    Patient Details  Name: Kathryn Merritt MRN: [id number] Date of Birth: 08-08-1961  Transition of [facility]) CM/SW Contact  Salome Arnt, [place] Phone Number: 06/08/2022, 10:47 AM  Clinical Narrative:   Transition of Care [facility]) Screening Note   Patient Details  Name: Kathryn Merritt Date of Birth: 04/15/1961   Transition of Care [facility]) CM/SW Contact:    Salome Arnt, [place] Phone Number: 06/08/2022, 10:48 AM    Transition of Care Department [facility]) has reviewed patient and no TOC needs have been identified at this time. We will continue to monitor patient advancement through interdisciplinary progression rounds. If new patient transition needs arise, please place a TOC consult.         Barriers to Discharge: Continued Medical Work up  Expected Discharge Plan and Services                                                 Social Determinants of Health (SDOH) Interventions    Readmission Risk Interventions     No data to display

## 2022-06-08 NOTE — Hospital Course (Signed)
61 y.o. female with medical history significant for diabetes mellitus, hypertension, OSA, paroxysmal atrial fibrillation on anticoagulation.  Patient presented to the ED with complaints of midsternal chest pain, ongoing over the past 4 to 5 days.  Reports radiation around her chest to her back, and towards her right neck.  She describes pain as pressure-like.  Chest pain was worse with exertion, but today she started having chest pains at rest.  No known relieving factors.  Duration of pain varies.  No associated difficulty breathing, nausea, vomiting or dizziness.  She is on anticoagulation with Eliquis and compliant.   ED Course: Heart rate 55-63.  Otherwise stable vitals.  Troponin 7 > 9.  EKG shows sinus bradycardia, old RBBB otherwise unchanged.  Chest x-ray clear.  Echo showed EF 65-70%, no WMA.  She was seen by cardiology who recommended LHC.

## 2022-06-08 NOTE — Progress Notes (Signed)
PROGRESS NOTE   Kathryn Merritt  RXV:[id number] DOB: Jun 30, 1961 DOA: 06/07/2022 PCP: Renee Rival, FNP   Chief Complaint  Patient presents with   Chest Pain   Level of care: Telemetry Cardiac  Brief Admission History:  61 y.o. female with medical history significant for diabetes mellitus, hypertension, OSA, paroxysmal atrial fibrillation on anticoagulation.  Patient presented to the ED with complaints of midsternal chest pain, ongoing over the past 4 to 5 days.  Reports radiation around her chest to her back, and towards her right neck.  She describes pain as pressure-like.  Chest pain was worse with exertion, but today she started having chest pains at rest.  No known relieving factors.  Duration of pain varies.  No associated difficulty breathing, nausea, vomiting or dizziness.  She is on anticoagulation with Eliquis and compliant.   ED Course: Heart rate 55-63.  Otherwise stable vitals.  Troponin 7 > 9.  EKG shows sinus bradycardia, old RBBB otherwise unchanged.  Chest x-ray clear.  EDP talked with Dr. Harl Bowie who recommends transfer to [facility] for [place].    Assessment and Plan: * Chest pain Exertional chest pain.  With typical and atypical features, with risk factors for CAD diabetes and hypertension, age.  Never smoked cigarettes.  Troponin 7> 9,  EKG unremarkable.  Stress test 04/02/2021 -low risk study, no perfusion deficits. - EDP talked cardiology, Dr. Harl Bowie -Follow up 2D echo -N.p.o. midnight -Aspirin '324mg'$  x 1 given ibn ED --Cardiology team transferring to [facility] for [place] under cardiology service  - Continue Eliquis, statins, metoprolol  OSA (obstructive sleep apnea) -- CPAP nightly  HTN (hypertension) Stable. -Resume metoprolol, Norvasc, lisinopril HCTZ  Controlled type 2 diabetes mellitus  A1c 6.6. - SSI- M -Hold Plecanatide, semaglutide tirzepatide injections once weekly CBG (last 3)  Recent Labs    06/07/22 2333 06/08/22 0518 06/08/22 1058  GLUCAP 122* 129* 98      Obesity, Class III, BMI 40-49.9 (morbid obesity) (HCC) BMI- 42.51.  Paroxysmal atrial fibrillation (HCC) Rate controlled and on anticoagulation. -Resume metoprolol and Eliquis.  DVT prophylaxis: apixaban Code Status: Full  Family Communication:  Disposition: Status is: Observation The patient remains OBS appropriate and will d/c before 2 midnights.   Consultants:  cardiology Procedures:   Antimicrobials:    Subjective: Pt reports that she continues to have chest pressure and heaviness and it is not going away.  Objective: Vitals:   06/08/22 0045 06/08/22 0209 06/08/22 0421 06/08/22 1018  BP:  130/76 128/73 (!) 140/68  Pulse: 64 (!) 57 68 64  Resp: '14 19 16 19  '$ Temp:  98.7 F (37.1 C) (!) 96.9 F (36.1 C) 98.2 F (36.8 C)  TempSrc:    Oral  SpO2: 98% 99% 98% 97%  Weight:      Height:        Intake/Output Summary (Last 24 hours) at 06/08/2022 1159 Last data filed at 06/08/2022 0900 Gross per 24 hour  Intake 0 ml  Output --  Net 0 ml   Filed Weights   06/07/22 1417 06/07/22 2217  Weight: 108.9 kg 108.8 kg   Examination:  General exam: Appears calm and comfortable  Respiratory system: Clear to auscultation. Respiratory effort normal. Cardiovascular system: normal S1 & S2 heard. No JVD, murmurs, rubs, gallops or clicks. No pedal edema. Gastrointestinal system: Abdomen is nondistended, soft and nontender. No organomegaly or masses felt. Normal bowel sounds heard. Central nervous system: Alert and oriented. No focal neurological deficits. Extremities: Symmetric 5 x 5 power. Skin:  No rashes, lesions or ulcers. Psychiatry: Judgement and insight appear normal. Mood & affect appropriate.   Data Reviewed: I have personally reviewed following labs and imaging studies  CBC: Recent Labs  Lab 06/07/22 1439  WBC 8.1  HGB 13.7  HCT 41.8  MCV 89.7  PLT 564    Basic Metabolic Panel: Recent Labs  Lab 06/07/22 1439  NA 139  K 3.7  CL 106  CO2 26   GLUCOSE 101*  BUN 22*  CREATININE 0.92  CALCIUM 10.0    CBG: Recent Labs  Lab 06/07/22 2333 06/08/22 0518 06/08/22 1058  GLUCAP 122* 129* 98    No results found for this or any previous visit (from the past 240 hour(s)).   Radiology Studies: ECHOCARDIOGRAM COMPLETE  Result Date: 06/08/2022    ECHOCARDIOGRAM REPORT   Patient Name:   Kathryn Merritt [facility] Date of Exam: 06/08/2022 Medical Rec #:  [id number]      Height:       63.0 in Accession #:    [id number]     Weight:       239.9 lb Date of Birth:  06-28-1961      BSA:          2.089 m Patient Age:    61 years       BP:           128/73 mmHg Patient Gender: F              HR:           64 bpm. Exam Location:  [facility] Procedure: 2D Echo, Cardiac Doppler and Color Doppler Indications:    Chest Pain  History:        Patient has prior history of Echocardiogram examinations, most                 recent 03/18/2021. Arrythmias:Atrial Fibrillation,                 Signs/Symptoms:Chest Pain; Risk Factors:Hypertension, Diabetes                 and Dyslipidemia.  Sonographer:    Wenda Low Referring Phys: [place]  1. Left ventricular ejection fraction, by estimation, is 65 to 70%. The left ventricle has normal function. The left ventricle has no regional wall motion abnormalities. There is mild left ventricular hypertrophy. Left ventricular diastolic parameters are consistent with Grade I diastolic dysfunction (impaired relaxation). Elevated left atrial pressure.  2. Right ventricular systolic function is normal. The right ventricular size is normal. There is normal pulmonary artery systolic pressure.  3. Left atrial size was moderately dilated.  4. Right atrial size was mild to moderately dilated.  5. The mitral valve is normal in structure. No evidence of mitral valve regurgitation. No evidence of mitral stenosis.  6. The tricuspid valve is abnormal.  7. The aortic valve is tricuspid. Aortic valve regurgitation is not  visualized. No aortic stenosis is present.  8. The inferior vena cava is normal in size with greater than 50% respiratory variability, suggesting right atrial pressure of 3 mmHg. FINDINGS  Left Ventricle: Left ventricular ejection fraction, by estimation, is 65 to 70%. The left ventricle has normal function. The left ventricle has no regional wall motion abnormalities. The left ventricular internal cavity size was normal in size. There is  mild left ventricular hypertrophy. Left ventricular diastolic parameters are consistent with Grade I diastolic dysfunction (impaired relaxation). Elevated left atrial pressure. Right Ventricle: The  right ventricular size is normal. Right vetricular wall thickness was not well visualized. Right ventricular systolic function is normal. There is normal pulmonary artery systolic pressure. The tricuspid regurgitant velocity is 2.61 m/s, and with an assumed right atrial pressure of 3 mmHg, the estimated right ventricular systolic pressure is 61.6 mmHg. Left Atrium: Left atrial size was moderately dilated. Right Atrium: Right atrial size was mild to moderately dilated. Pericardium: There is no evidence of pericardial effusion. Mitral Valve: The mitral valve is normal in structure. No evidence of mitral valve regurgitation. No evidence of mitral valve stenosis. MV peak gradient, 6.4 mmHg. The mean mitral valve gradient is 2.0 mmHg. Tricuspid Valve: The tricuspid valve is abnormal. Tricuspid valve regurgitation is mild . No evidence of tricuspid stenosis. Aortic Valve: The aortic valve is tricuspid. Aortic valve regurgitation is not visualized. No aortic stenosis is present. Aortic valve mean gradient measures 9.5 mmHg. Aortic valve peak gradient measures 20.0 mmHg. Aortic valve area, by VTI measures 1.83  cm. Pulmonic Valve: The pulmonic valve was not well visualized. Pulmonic valve regurgitation is not visualized. No evidence of pulmonic stenosis. Aorta: The aortic root is normal in  size and structure. Venous: The inferior vena cava is normal in size with greater than 50% respiratory variability, suggesting right atrial pressure of 3 mmHg. IAS/Shunts: No atrial level shunt detected by color flow Doppler.  LEFT VENTRICLE PLAX 2D LVIDd:         5.20 cm   Diastology LVIDs:         2.60 cm   LV e' medial:    5.44 cm/s LV PW:         1.00 cm   LV E/e' medial:  16.9 LV IVS:        1.20 cm   LV e' lateral:   5.66 cm/s LVOT diam:     1.80 cm   LV E/e' lateral: 16.2 LV SV:         95 LV SV Index:   45 LVOT Area:     2.54 cm  RIGHT VENTRICLE RV Basal diam:  3.97 cm RV Mid diam:    3.40 cm RV S prime:     17.80 cm/s TAPSE (M-mode): 3.9 cm LEFT ATRIUM             Index        RIGHT ATRIUM           Index LA diam:        4.40 cm 2.11 cm/m   RA Area:     21.10 cm LA Vol (A2C):   94.2 ml 45.10 ml/m  RA Volume:   60.80 ml  29.11 ml/m LA Vol (A4C):   86.3 ml 41.31 ml/m LA Biplane Vol: 92.1 ml 44.09 ml/m  AORTIC VALVE                     PULMONIC VALVE AV Area (Vmax):    1.59 cm      PV Vmax:       1.47 m/s AV Area (Vmean):   1.76 cm      PV Peak grad:  8.6 mmHg AV Area (VTI):     1.83 cm AV Vmax:           223.50 cm/s AV Vmean:          140.000 cm/s AV VTI:            0.520 m AV Peak Grad:  20.0 mmHg AV Mean Grad:      9.5 mmHg LVOT Vmax:         140.00 cm/s LVOT Vmean:        96.700 cm/s LVOT VTI:          0.373 m LVOT/AV VTI ratio: 0.72  AORTA Ao Root diam: 3.20 cm MITRAL VALVE                TRICUSPID VALVE MV Area (PHT): 2.31 cm     TR Peak grad:   27.2 mmHg MV Area VTI:   2.37 cm     TR Vmax:        261.00 cm/s MV Peak grad:  6.4 mmHg MV Mean grad:  2.0 mmHg     SHUNTS MV Vmax:       1.26 m/s     Systemic VTI:  0.37 m MV Vmean:      59.5 cm/s    Systemic Diam: 1.80 cm MV Decel Time: 329 msec MV E velocity: 91.70 cm/s MV A velocity: 106.00 cm/s MV E/A ratio:  0.87 Carlyle Dolly MD Electronically signed by Carlyle Dolly MD Signature Date/Time: 06/08/2022/10:47:53 AM    Final    DG Chest  2 View  Result Date: 06/07/2022 CLINICAL DATA:  Dyspnea, worsening today, atrial fibrillation EXAM: CHEST - 2 VIEW COMPARISON:  03/18/2021 chest radiograph. FINDINGS: Stable cardiomediastinal silhouette with normal heart size. No pneumothorax. No pleural effusion. Lungs appear clear, with no acute consolidative airspace disease and no pulmonary edema. IMPRESSION: No active cardiopulmonary disease. Electronically Signed   By: Ilona Sorrel M.D.   On: 06/07/2022 14:51    Scheduled Meds:  amLODipine  10 mg Oral Daily   [START ON 06/09/2022] aspirin EC  81 mg Oral Daily   insulin aspart  0-15 Units Subcutaneous Q6H   lisinopril  20 mg Oral Daily   metoprolol succinate  50 mg Oral Daily   pantoprazole  40 mg Oral Daily   Plecanatide  3 mg Oral Daily   rosuvastatin  10 mg Oral Daily   Continuous Infusions:   LOS: 0 days   Time spent: 36 mins  Tequlia Gonsalves Wynetta Emery, MD How to contact the [facility] Attending or Consulting provider [place] or covering provider during after hours [place], for this patient?  Check the care team in [facility] and look for a) attending/consulting [facility] provider listed and b) the [facility] team listed Log into [email or web address] and use [place]'s universal password to access. If you do not have the password, please contact the hospital operator. Locate the [facility] provider you are looking for under [facility] and page to a number that you can be directly reached. If you still have difficulty reaching the provider, please page the [facility] (Director on Call) for the Hospitalists listed on amion for assistance.  06/08/2022, 11:59 AM

## 2022-06-08 NOTE — Progress Notes (Signed)
*  PRELIMINARY RESULTS* Echocardiogram 2D Echocardiogram has been performed.  Kathryn Merritt 06/08/2022, 10:30 AM

## 2022-06-09 ENCOUNTER — Encounter (HOSPITAL_COMMUNITY)
Admission: EM | Disposition: A | Discharge status: Home / Self Care | Payer: Self-pay | Source: Home / Self Care | Attending: Emergency Medicine

## 2022-06-09 ENCOUNTER — Telehealth: Payer: Self-pay | Admitting: Cardiology

## 2022-06-09 DIAGNOSIS — I209 Angina pectoris, unspecified: Secondary | ICD-10-CM | POA: Diagnosis not present

## 2022-06-09 DIAGNOSIS — I4819 Other persistent atrial fibrillation: Secondary | ICD-10-CM | POA: Diagnosis not present

## 2022-06-09 DIAGNOSIS — I480 Paroxysmal atrial fibrillation: Secondary | ICD-10-CM | POA: Diagnosis not present

## 2022-06-09 DIAGNOSIS — R079 Chest pain, unspecified: Secondary | ICD-10-CM | POA: Diagnosis not present

## 2022-06-09 DIAGNOSIS — I10 Essential (primary) hypertension: Secondary | ICD-10-CM | POA: Diagnosis not present

## 2022-06-09 HISTORY — PX: CORONARY ANGIOGRAPHY: CATH118303

## 2022-06-09 LAB — LIPID PANEL
Cholesterol: 137 mg/dL (ref 0–200)
HDL: 41 mg/dL (ref >40)
LDL Cholesterol: 76 mg/dL (ref 0–99)
Total CHOL/HDL Ratio: 3.3 RATIO
Triglycerides: 100 mg/dL (ref <150)
VLDL: 20 mg/dL (ref 0–40)

## 2022-06-09 LAB — GLUCOSE, CAPILLARY
Glucose-Capillary: 153 mg/dL — ABNORMAL HIGH (ref 70–99)
Glucose-Capillary: 106 mg/dL — ABNORMAL HIGH (ref 70–99)

## 2022-06-09 SURGERY — CORONARY ANGIOGRAPHY (CATH LAB)
Anesthesia: LOCAL

## 2022-06-09 MED ORDER — ACETAMINOPHEN 325 MG PO TABS: 650.0000 mg | ORAL_TABLET | ORAL | Status: DC | PRN

## 2022-06-09 MED ORDER — HEPARIN (PORCINE) IN NACL 1000-0.9 UT/500ML-% IV SOLN
INTRAVENOUS | Status: AC
  Filled 2022-06-09: qty 500

## 2022-06-09 MED ORDER — HEPARIN (PORCINE) IN NACL 1000-0.9 UT/500ML-% IV SOLN
INTRAVENOUS | Status: DC | PRN
  Administered 2022-06-09 (×2): 500 mL

## 2022-06-09 MED ORDER — SODIUM CHLORIDE 0.9 % WEIGHT BASED INFUSION
3.0000 mL/kg/h | INTRAVENOUS | Status: AC
  Administered 2022-06-09: 3 mL/kg/h via INTRAVENOUS

## 2022-06-09 MED ORDER — MIDAZOLAM HCL 2 MG/2ML IJ SOLN
INTRAMUSCULAR | Status: AC
  Filled 2022-06-09: qty 2

## 2022-06-09 MED ORDER — LABETALOL HCL 5 MG/ML IV SOLN: 10.0000 mg | INTRAVENOUS | Status: DC | PRN

## 2022-06-09 MED ORDER — HEPARIN SODIUM (PORCINE) 1000 UNIT/ML IJ SOLN
INTRAMUSCULAR | Status: AC
  Filled 2022-06-09: qty 10

## 2022-06-09 MED ORDER — ROSUVASTATIN CALCIUM 20 MG PO TABS
20.0000 mg | ORAL_TABLET | Freq: Every day | ORAL | Status: DC
  Administered 2022-06-09: 20 mg via ORAL

## 2022-06-09 MED ORDER — SODIUM CHLORIDE 0.9 % IV SOLN: 250.0000 mL | INTRAVENOUS | Status: DC | PRN

## 2022-06-09 MED ORDER — ASPIRIN 81 MG PO TBEC: 81.0000 mg | DELAYED_RELEASE_TABLET | Freq: Every day | ORAL | Status: DC

## 2022-06-09 MED ORDER — HEPARIN SODIUM (PORCINE) 1000 UNIT/ML IJ SOLN
INTRAMUSCULAR | Status: DC | PRN
  Administered 2022-06-09: 5000 [IU] via INTRAVENOUS

## 2022-06-09 MED ORDER — HYDRALAZINE HCL 20 MG/ML IJ SOLN: 10.0000 mg | INTRAMUSCULAR | Status: DC | PRN

## 2022-06-09 MED ORDER — SODIUM CHLORIDE 0.9% FLUSH: 3.0000 mL | INTRAVENOUS | Status: DC | PRN

## 2022-06-09 MED ORDER — SODIUM CHLORIDE 0.9 % IV SOLN: INTRAVENOUS | Status: DC

## 2022-06-09 MED ORDER — ASPIRIN 81 MG PO CHEW: 81.0000 mg | CHEWABLE_TABLET | ORAL | Status: DC

## 2022-06-09 MED ORDER — ONDANSETRON HCL 4 MG/2ML IJ SOLN: 4.0000 mg | Freq: Four times a day (QID) | INTRAMUSCULAR | Status: DC | PRN

## 2022-06-09 MED ORDER — VERAPAMIL HCL 2.5 MG/ML IV SOLN
INTRAVENOUS | Status: AC
  Filled 2022-06-09: qty 2

## 2022-06-09 MED ORDER — LIDOCAINE HCL (PF) 1 % IJ SOLN
INTRAMUSCULAR | Status: DC | PRN
  Administered 2022-06-09: 2 mL

## 2022-06-09 MED ORDER — SODIUM CHLORIDE 0.9 % WEIGHT BASED INFUSION: 3.0000 mL/kg/h | INTRAVENOUS | Status: DC

## 2022-06-09 MED ORDER — INSULIN ASPART 100 UNIT/ML IJ SOLN
0.0000 [IU] | Freq: Three times a day (TID) | INTRAMUSCULAR | Status: DC
  Administered 2022-06-09: 2 [IU] via SUBCUTANEOUS

## 2022-06-09 MED ORDER — MIDAZOLAM HCL 2 MG/2ML IJ SOLN
INTRAMUSCULAR | Status: DC | PRN
  Administered 2022-06-09 (×2): 1 mg via INTRAVENOUS

## 2022-06-09 MED ORDER — FENTANYL CITRATE (PF) 100 MCG/2ML IJ SOLN
INTRAMUSCULAR | Status: DC | PRN
  Administered 2022-06-09 (×2): 25 ug via INTRAVENOUS

## 2022-06-09 MED ORDER — SODIUM CHLORIDE 0.9 % WEIGHT BASED INFUSION: 1.0000 mL/kg/h | INTRAVENOUS | Status: DC

## 2022-06-09 MED ORDER — VERAPAMIL HCL 2.5 MG/ML IV SOLN
INTRAVENOUS | Status: DC | PRN
  Administered 2022-06-09: 10 mL via INTRA_ARTERIAL

## 2022-06-09 MED ORDER — SODIUM CHLORIDE 0.9% FLUSH: 3.0000 mL | Freq: Two times a day (BID) | INTRAVENOUS | Status: DC

## 2022-06-09 MED ORDER — SODIUM CHLORIDE 0.9 % WEIGHT BASED INFUSION
1.0000 mL/kg/h | INTRAVENOUS | Status: DC
  Administered 2022-06-09: 1 mL/kg/h via INTRAVENOUS

## 2022-06-09 MED ORDER — ASPIRIN 81 MG PO CHEW
81.0000 mg | CHEWABLE_TABLET | ORAL | Status: AC
  Administered 2022-06-09: 81 mg via ORAL
  Filled 2022-06-09: qty 1

## 2022-06-09 MED ORDER — IOHEXOL 350 MG/ML SOLN
INTRAVENOUS | Status: DC | PRN
  Administered 2022-06-09: 19 mL

## 2022-06-09 MED ORDER — LIDOCAINE HCL (PF) 1 % IJ SOLN
INTRAMUSCULAR | Status: AC
  Filled 2022-06-09: qty 30

## 2022-06-09 MED ORDER — FENTANYL CITRATE (PF) 100 MCG/2ML IJ SOLN
INTRAMUSCULAR | Status: AC
  Filled 2022-06-09: qty 2

## 2022-06-09 SURGICAL SUPPLY — 10 items
CATH 5FR PIGTAIL DIAGNOSTIC (CATHETERS) IMPLANT
CATH OPTITORQUE TIG 4.0 6F (CATHETERS) IMPLANT
DEVICE RAD COMP TR BAND LRG (VASCULAR PRODUCTS) IMPLANT
GLIDESHEATH SLEND SS 6F .021 (SHEATH) IMPLANT
GUIDEWIRE INQWIRE 1.5J.035X260 (WIRE) IMPLANT
INQWIRE 1.5J .035X260CM (WIRE) ×1
PACK CARDIAC CATHETERIZATION (CUSTOM PROCEDURE TRAY) ×1 IMPLANT
SET ATX SIMPLICITY (MISCELLANEOUS) IMPLANT
TRANSDUCER W/STOPCOCK (MISCELLANEOUS) ×1 IMPLANT
TUBING CIL FLEX 10 FLL-RA (TUBING) ×1 IMPLANT

## 2022-06-09 NOTE — Assessment & Plan Note (Signed)
Continue statin. 

## 2022-06-09 NOTE — Progress Notes (Signed)
PROGRESS NOTE  Kathryn Merritt [id number] DOB: 1961-09-17 DOA: 06/07/2022 PCP: Renee Rival, FNP  Brief History:  61 y.o. female with medical history significant for diabetes mellitus, hypertension, OSA, paroxysmal atrial fibrillation on anticoagulation.  Patient presented to the ED with complaints of midsternal chest pain, ongoing over the past 4 to 5 days.  Reports radiation around her chest to her back, and towards her right neck.  She describes pain as pressure-like.  Chest pain was worse with exertion, but today she started having chest pains at rest.  No known relieving factors.  Duration of pain varies.  No associated difficulty breathing, nausea, vomiting or dizziness.  She is on anticoagulation with Eliquis and compliant.   ED Course: Heart rate 55-63.  Otherwise stable vitals.  Troponin 7 > 9.  EKG shows sinus bradycardia, old RBBB otherwise unchanged.  Chest x-ray clear.  Echo showed EF 65-70%, no WMA.  She was seen by cardiology who recommended LHC.    Assessment and Plan: * Chest pain Troponin 7> 9,  EKG unremarkable.  Stress test 04/02/2021 -low risk study, no perfusion deficits. - concerned about accelerated angina -9/19 Echo EF 65-70%, no WMA, G1DD -continue ASA -appreciate cardiology>>transfer to [facility] for LHC -continue statin, metoprolol succinate  OSA (obstructive sleep apnea) -- CPAP nightly  Hyperlipidemia Continue statin  HTN (hypertension) Stable. -Resume metoprolol, Norvasc -holding lisinopril/HCTZ in prep for LHC  Controlled type 2 diabetes mellitus  A1c 6.6. - SSI- -Hold Plecanatide, semaglutide tirzepatide injections once weekly  Obesity, Class III, BMI 40-49.9 (morbid obesity) (HCC) BMI- 42.51.  Paroxysmal atrial fibrillation (HCC) Rate controlled and on anticoagulation. -Resume metoprolol -holding apixaban for heart cath -currently in sinus    Family Communication:   no Family at bedside  Consultants:  cardiology  Code  Status:  FULL   DVT Prophylaxis:  apixaban on hold   Procedures: As Listed in Progress Note Above  Antibiotics: None     Subjective: Continues to have intermittent chest discomfort with sob.  Denies f/c, n/v/d, abd pain, dysuria  Objective: Vitals:   06/08/22 1018 06/08/22 1424 06/08/22 2120 06/09/22 0410  BP: (!) 140/68 124/69 (!) 106/54 (!) 109/45  Pulse: 64 (!) 59 60 (!) 57  Resp: '19 16 19 15  '$ Temp: 98.2 F (36.8 C) (!) 97.5 F (36.4 C) (!) 97.2 F (36.2 C) (!) 97 F (36.1 C)  TempSrc: Oral     SpO2: 97% 96% 97% 97%  Weight:      Height:        Intake/Output Summary (Last 24 hours) at 06/09/2022 0932 Last data filed at 06/09/2022 6712 Gross per 24 hour  Intake 486 ml  Output --  Net 486 ml   Weight change:  Exam:  General:  Pt is alert, follows commands appropriately, not in acute distress HEENT: No icterus, No thrush, No neck mass, [place]/AT Cardiovascular: RRR, S1/S2, no rubs, no gallops Respiratory: CTA bilaterally, no wheezing, no crackles, no rhonchi Abdomen: Soft/+BS, non tender, non distended, no guarding Extremities: No edema, No lymphangitis, No petechiae, No rashes, no synovitis   Data Reviewed: I have personally reviewed following labs and imaging studies Basic Metabolic Panel: Recent Labs  Lab 06/07/22 1439  NA 139  K 3.7  CL 106  CO2 26  GLUCOSE 101*  BUN 22*  CREATININE 0.92  CALCIUM 10.0   Liver Function Tests: No results for input(s): "AST", "ALT", "ALKPHOS", "BILITOT", "PROT", "ALBUMIN" in the last 168  hours. No results for input(s): "LIPASE", "AMYLASE" in the last 168 hours. No results for input(s): "AMMONIA" in the last 168 hours. Coagulation Profile: No results for input(s): "INR", "PROTIME" in the last 168 hours. CBC: Recent Labs  Lab 06/07/22 1439  WBC 8.1  HGB 13.7  HCT 41.8  MCV 89.7  PLT 249   Cardiac Enzymes: No results for input(s): "CKTOTAL", "CKMB", "CKMBINDEX", "TROPONINI" in the last 168  hours. BNP: Invalid input(s): "POCBNP" CBG: Recent Labs  Lab 06/07/22 2333 06/08/22 0518 06/08/22 1058 06/08/22 1825 06/08/22 2340  GLUCAP 122* 129* 98 180* 128*   HbA1C: No results for input(s): "HGBA1C" in the last 72 hours. Urine analysis:    Component Value Date/Time   COLORURINE YELLOW 10/24/2021 1136   APPEARANCEUR CLEAR 10/24/2021 1136   LABSPEC 1.010 10/24/2021 1136   PHURINE 7.0 10/24/2021 1136   GLUCOSEU NEGATIVE 10/24/2021 1136   HGBUR NEGATIVE 10/24/2021 1136   BILIRUBINUR NEGATIVE 10/24/2021 1136   BILIRUBINUR negative 10/23/2021 1415   KETONESUR NEGATIVE 10/24/2021 1136   PROTEINUR NEGATIVE 10/24/2021 1136   UROBILINOGEN 0.2 10/23/2021 1415   UROBILINOGEN 0.2 11/03/2014 1140   NITRITE NEGATIVE 10/24/2021 1136   LEUKOCYTESUR NEGATIVE 10/24/2021 1136   Sepsis Labs: '@LABRCNTIP'$ (procalcitonin:4,lacticidven:4) )No results found for this or any previous visit (from the past 240 hour(s)).   Scheduled Meds:  amLODipine  10 mg Oral Daily   aspirin  81 mg Oral Pre-Cath   [START ON 06/10/2022] aspirin EC  81 mg Oral Daily   insulin aspart  0-15 Units Subcutaneous Q6H   lisinopril  20 mg Oral Daily   metoprolol succinate  50 mg Oral Daily   pantoprazole  40 mg Oral Daily   rosuvastatin  10 mg Oral Daily   sodium chloride flush  3 mL Intravenous Q12H   Continuous Infusions:  sodium chloride     Followed by   sodium chloride      Procedures/Studies: ECHOCARDIOGRAM COMPLETE  Result Date: 06/08/2022    ECHOCARDIOGRAM REPORT   Patient Name:   Kathryn Merritt [facility] Date of Exam: 06/08/2022 Medical Rec #:  [id number]      Height:       63.0 in Accession #:    [id number]     Weight:       239.9 lb Date of Birth:  [birth year]      BSA:          2.089 m Patient Age:    61 years       BP:           128/73 mmHg Patient Gender: F              HR:           64 bpm. [facility]am Location[facility]  [facility] Procedure: 2D Echo, Cardiac Doppler and Color Doppler Indications:    Chest Pain  History:         Patient has prior history of Echocardiogram examinations, most                 recent 03/18/2021. Arrythmias:Atrial Fibrillation,                 Signs/Symptoms:Chest Pain; Risk Factors:Hypertension, Diabetes                 and Dyslipidemia.  Sonographer:    Wenda Low Referring Phys: [place]  1. Left ventricular ejection fraction, by estimation, is 65 to 70%. The left ventricle has normal function. The left  ventricle has no regional wall motion abnormalities. There is mild left ventricular hypertrophy. Left ventricular diastolic parameters are consistent with Grade I diastolic dysfunction (impaired relaxation). Elevated left atrial pressure.  2. Right ventricular systolic function is normal. The right ventricular size is normal. There is normal pulmonary artery systolic pressure.  3. Left atrial size was moderately dilated.  4. Right atrial size was mild to moderately dilated.  5. The mitral valve is normal in structure. No evidence of mitral valve regurgitation. No evidence of mitral stenosis.  6. The tricuspid valve is abnormal.  7. The aortic valve is tricuspid. Aortic valve regurgitation is not visualized. No aortic stenosis is present.  8. The inferior vena cava is normal in size with greater than 50% respiratory variability, suggesting right atrial pressure of 3 mmHg. FINDINGS  Left Ventricle: Left ventricular ejection fraction, by estimation, is 65 to 70%. The left ventricle has normal function. The left ventricle has no regional wall motion abnormalities. The left ventricular internal cavity size was normal in size. There is  mild left ventricular hypertrophy. Left ventricular diastolic parameters are consistent with Grade I diastolic dysfunction (impaired relaxation). Elevated left atrial pressure. Right Ventricle: The right ventricular size is normal. Right vetricular wall thickness was not well visualized. Right ventricular systolic function is normal. There is  normal pulmonary artery systolic pressure. The tricuspid regurgitant velocity is 2.61 m/s, and with an assumed right atrial pressure of 3 mmHg, the estimated right ventricular systolic pressure is 93.2 mmHg. Left Atrium: Left atrial size was moderately dilated. Right Atrium: Right atrial size was mild to moderately dilated. Pericardium: There is no evidence of pericardial effusion. Mitral Valve: The mitral valve is normal in structure. No evidence of mitral valve regurgitation. No evidence of mitral valve stenosis. MV peak gradient, 6.4 mmHg. The mean mitral valve gradient is 2.0 mmHg. Tricuspid Valve: The tricuspid valve is abnormal. Tricuspid valve regurgitation is mild . No evidence of tricuspid stenosis. Aortic Valve: The aortic valve is tricuspid. Aortic valve regurgitation is not visualized. No aortic stenosis is present. Aortic valve mean gradient measures 9.5 mmHg. Aortic valve peak gradient measures 20.0 mmHg. Aortic valve area, by VTI measures 1.83  cm. Pulmonic Valve: The pulmonic valve was not well visualized. Pulmonic valve regurgitation is not visualized. No evidence of pulmonic stenosis. Aorta: The aortic root is normal in size and structure. Venous: The inferior vena cava is normal in size with greater than 50% respiratory variability, suggesting right atrial pressure of 3 mmHg. IAS/Shunts: No atrial level shunt detected by color flow Doppler.  LEFT VENTRICLE PLAX 2D LVIDd:         5.20 cm   Diastology LVIDs:         2.60 cm   LV e' medial:    5.44 cm/s LV PW:         1.00 cm   LV E/e' medial:  16.9 LV IVS:        1.20 cm   LV e' lateral:   5.66 cm/s LVOT diam:     1.80 cm   LV E/e' lateral: 16.2 LV SV:         95 LV SV Index:   45 LVOT Area:     2.54 cm  RIGHT VENTRICLE RV Basal diam:  3.97 cm RV Mid diam:    3.40 cm RV S prime:     17.80 cm/s TAPSE (M-mode): 3.9 cm LEFT ATRIUM             Index  RIGHT ATRIUM           Index LA diam:        4.40 cm 2.11 cm/m   RA Area:     21.10 cm LA Vol  (A2C):   94.2 ml 45.10 ml/m  RA Volume:   60.80 ml  29.11 ml/m LA Vol (A4C):   86.3 ml 41.31 ml/m LA Biplane Vol: 92.1 ml 44.09 ml/m  AORTIC VALVE                     PULMONIC VALVE AV Area (Vmax):    1.59 cm      PV Vmax:       1.47 m/s AV Area (Vmean):   1.76 cm      PV Peak grad:  8.6 mmHg AV Area (VTI):     1.83 cm AV Vmax:           223.50 cm/s AV Vmean:          140.000 cm/s AV VTI:            0.520 m AV Peak Grad:      20.0 mmHg AV Mean Grad:      9.5 mmHg LVOT Vmax:         140.00 cm/s LVOT Vmean:        96.700 cm/s LVOT VTI:          0.373 m LVOT/AV VTI ratio: 0.72  AORTA Ao Root diam: 3.20 cm MITRAL VALVE                TRICUSPID VALVE MV Area (PHT): 2.31 cm     TR Peak grad:   27.2 mmHg MV Area VTI:   2.37 cm     TR Vmax:        261.00 cm/s MV Peak grad:  6.4 mmHg MV Mean grad:  2.0 mmHg     SHUNTS MV Vmax:       1.26 m/s     Systemic VTI:  0.37 m MV Vmean:      59.5 cm/s    Systemic Diam: 1.80 cm MV Decel Time: 329 msec MV E velocity: 91.70 cm/s MV A velocity: 106.00 cm/s MV E/A ratio:  0.87 Carlyle Dolly MD Electronically signed by Carlyle Dolly MD Signature Date/Time: 06/08/2022/10:47:53 AM    Final    DG Chest 2 View  Result Date: 06/07/2022 CLINICAL DATA:  Dyspnea, worsening today, atrial fibrillation EXAM: CHEST - 2 VIEW COMPARISON:  03/18/2021 chest radiograph. FINDINGS: Stable cardiomediastinal silhouette with normal heart size. No pneumothorax. No pleural effusion. Lungs appear clear, with no acute consolidative airspace disease and no pulmonary edema. IMPRESSION: No active cardiopulmonary disease. Electronically Signed   By: Ilona Sorrel M.D.   On: 06/07/2022 14:51    Orson Eva, DO  [facility]  If 7PM-7AM, please contact night-coverage [email or web address] Password [email or web address] 06/09/2022, 7:12 AM   LOS: 0 days

## 2022-06-09 NOTE — Progress Notes (Signed)
TR BAND REMOVAL  LOCATION:    right radial  DEFLATED PER PROTOCOL:    Yes.    TIME BAND OFF / DRESSING APPLIED:    1530   SITE UPON ARRIVAL:    Level 0  SITE AFTER BAND REMOVAL:    Level 0  CIRCULATION SENSATION AND MOVEMENT:    Within Normal Limits   Yes.    COMMENTS:   No bleeding noted

## 2022-06-09 NOTE — Discharge Summary (Addendum)
Discharge Summary    Patient ID: Kathryn Merritt MRN: [id number]; DOB: 08-10-1961  Admit date: 06/07/2022 Discharge date: 06/09/2022  PCP:  Renee Rival, [place] Providers Cardiologist:  Minus Breeding, MD  Sleep Medicine:  Fransico Him, MD  {   Discharge Diagnoses    Principal Problem:   Chest pain Active Problems:   Paroxysmal atrial fibrillation (HCC)   Obesity, Class III, BMI 40-49.9 (morbid obesity) ([place])   Controlled type 2 diabetes mellitus    Essential hypertension   Hyperlipidemia   OSA (obstructive sleep apnea)    Diagnostic Studies/Procedures    Echocardiogram 06/08/2022: Impressions:  1. Left ventricular ejection fraction, by estimation, is 65 to 70%. The  left ventricle has normal function. The left ventricle has no regional  wall motion abnormalities. There is mild left ventricular hypertrophy.  Left ventricular diastolic parameters  are consistent with Grade I diastolic dysfunction (impaired relaxation).  Elevated left atrial pressure.   2. Right ventricular systolic function is normal. The right ventricular  size is normal. There is normal pulmonary artery systolic pressure.   3. Left atrial size was moderately dilated.   4. Right atrial size was mild to moderately dilated.   5. The mitral valve is normal in structure. No evidence of mitral valve  regurgitation. No evidence of mitral stenosis.   6. The tricuspid valve is abnormal.   7. The aortic valve is tricuspid. Aortic valve regurgitation is not  visualized. No aortic stenosis is present.   8. The inferior vena cava is normal in size with greater than 50%  respiratory variability, suggesting right atrial pressure of 3 mmHg.  _____________   Left Cardiac Catheterization 06/09/2022: 1.  Normal right dominant circulation with no evidence of spasm, dissection, or bridging. 2.  LVEDP not assessed due to upper extremity vasospasm.   Recommendation: Medical  therapy.  History of Present Illness     Kathryn Merritt is a 61 y.o. female with a history of paroxysmal atrial fibrillation on Eliquis, hypertension, hyperlipidemia, type 2 diabetes mellitus, and obstructive sleep apnea who presented to [facility] on 06/07/2022 for evaluation of dyspnea, chest pressure, and fatigue. EKG showed normal sinus rhythm but given her symptoms, she was advised to proceed to the ED for further evaluation. She then presented to the [facility] ED as recommended. She reported having progressive chest pain and dyspnea on exertion for the past week. She works at a daycare and reported discomfort along her sternal region which has occurred with walking or doing minimal activity and resolves with rest. She denied any association with positional changes or food consumption. She did try taking Tylenol at times with no improvement in symptoms. No recent orthopnea, PND, or pitting edema. She has a history of obstructive sleep apnea and reported ongoing difficulty with her PCP machine. She denied any recent travel.  EKG showed normal sinus rhythm, rate 60 bpm, with known RBBB. High-sensitivity troponin negative x2 at 7 >> 9. Chest x-ray showed no acute findings. WBC 8.1, Hgb 13.7, Plts 249. Na 139, K 3.7, Cr 0.92. Echo showed LVEF of 65-70% with no regional wall motion abnormalities, mild LVH, and grade 1 diastolic dysfunction. She was seen by Cardiology and it was recommended that she be transferred to [facility] for cardiac catheterization.   Hospital Course     Consultants: None  Chest Pain Patient presented to [facility] with exertional chest pain concerning for unstable angina. High-sensitivity troponin negative x2. EKG  showed normal sinus rhythm with known RBBB. Echo showed LVEF of 65-70% with no regional wall motion abnormalities, mild LVH, and grade 1 diastolic dysfunction. She was transferred to [facility] for cardiac catheterization which showed normal right dominant  circulation with no evidence of spasm, dissection, or bridging. She tolerated the procedure well and was felt to be stable for discharge. Right radial cath site was re-evaluated prior to discharge and found to be stable without any complications. Instructions/precautions regarding cath site care were given prior to discharge.  Persistent Atrial Fibrillation History of persistent atrial fibrillation s/p DCCV in 02/2021. She had recurrence in 11/2021 and underwent another successful DCCV. She has maintained sinus rhythm this admission. Continue Toprol-XL 40m daily. Eliquis was held on admission in anticipation of cardiac catheterization but can be resumed on morning of 06/10/2022.  Hypertension BP mostly well controlled. Continue home medications: Amlodipine 152mdaily, Toprol-XL 5086maily, and Lisinopril-HCTZ 20-57m35mily.  Hyperlipidemia Lipid panel this admission: Total Cholesterol 137, Triglycerides 100, HDL 41, LDL 76. Given normal coronaries, OK to continue home Crestor 10mg14mly.  Type 2 Diabetes Mellitus Hemoglobin A1c 6.6 in 03/2022. Resume home regimen at discharge.  Obstructive Sleep Apnea Continue CPAP. Followed by Dr. TurneRadford Paxtient seen and examined by Dr. ThukkAli Lowey and determined to be stable for discharge following cardiac catheterization. Outpatient follow-up arranged. Medications as below. Provided work notes for both patient and husband.   Did the patient have an acute coronary syndrome (MI, NSTEMI, STEMI, etc) this admission?:  No                               Did the patient have a percutaneous coronary intervention (stent / angioplasty)?:  No.    _____________  Discharge Vitals Blood pressure 117/70, pulse (!) 59, temperature (!) 97 F (36.1 C), resp. rate 13, height _0  (1.6 m), weight 108.8 kg, SpO2 99 %.  Filed Weights   06/07/22 1417 06/07/22 2217  Weight: 108.9 kg 108.8 kg    Labs & Radiologic Studies    CBC Recent Labs    06/07/22 1439  WBC  8.1  HGB 13.7  HCT 41.8  MCV 89.7  PLT 249  626sic Metabolic Panel Recent Labs    06/07/22 1439  NA 139  K 3.7  CL 106  CO2 26  GLUCOSE 101*  BUN 22*  CREATININE 0.92  CALCIUM 10.0   Liver Function Tests No results for input(s): "AST", "ALT", "ALKPHOS", "BILITOT", "PROT", "ALBUMIN" in the last 72 hours. No results for input(s): "LIPASE", "AMYLASE" in the last 72 hours. High Sensitivity Troponin:   Recent Labs  Lab 06/07/22 1439 06/07/22 1630  TROPONINIHS 7 9    BNP Invalid input(s): "POCBNP" D-Dimer No results for input(s): "DDIMER" in the last 72 hours. Hemoglobin A1C No results for input(s): "HGBA1C" in the last 72 hours. Fasting Lipid Panel Recent Labs    06/09/22 0520  CHOL 137  HDL 41  LDLCALC 76  TRIG 100  CHOLHDL 3.3   Thyroid Function Tests No results for input(s): "TSH", "T4TOTAL", "T3FREE", "THYROIDAB" in the last 72 hours.  Invalid input(s): "FREET3" _____________  CARDIAC CATHETERIZATION  Result Date: 06/09/2022 1.  Normal right dominant circulation with no evidence of spasm, dissection, or bridging. 2.  LVEDP not assessed due to upper extremity vasospasm. Recommendation: Medical therapy.   ECHOCARDIOGRAM COMPLETE  Result Date: 06/08/2022    ECHOCARDIOGRAM REPORT   Patient Name:  Heleena L Thorpe Date of Exam: 06/08/2022 Medical Rec #:  [id number]      Height:       63.0 in Accession #:    [id number]     Weight:       239.9 lb Date of Birth:  Jan 30, 1961      BSA:          2.089 m Patient Age:    63 years       BP:           128/73 mmHg Patient Gender: F              HR:           64 bpm. Exam Location:  [facility] Procedure: 2D Echo, Cardiac Doppler and Color Doppler Indications:    Chest Pain  History:        Patient has prior history of Echocardiogram examinations, most                 recent 03/18/2021. Arrythmias:Atrial Fibrillation,                 Signs/Symptoms:Chest Pain; Risk Factors:Hypertension, Diabetes                 and Dyslipidemia.   Sonographer:    Wenda Low Referring Phys: [place]  1. Left ventricular ejection fraction, by estimation, is 65 to 70%. The left ventricle has normal function. The left ventricle has no regional wall motion abnormalities. There is mild left ventricular hypertrophy. Left ventricular diastolic parameters are consistent with Grade I diastolic dysfunction (impaired relaxation). Elevated left atrial pressure.  2. Right ventricular systolic function is normal. The right ventricular size is normal. There is normal pulmonary artery systolic pressure.  3. Left atrial size was moderately dilated.  4. Right atrial size was mild to moderately dilated.  5. The mitral valve is normal in structure. No evidence of mitral valve regurgitation. No evidence of mitral stenosis.  6. The tricuspid valve is abnormal.  7. The aortic valve is tricuspid. Aortic valve regurgitation is not visualized. No aortic stenosis is present.  8. The inferior vena cava is normal in size with greater than 50% respiratory variability, suggesting right atrial pressure of 3 mmHg. FINDINGS  Left Ventricle: Left ventricular ejection fraction, by estimation, is 65 to 70%. The left ventricle has normal function. The left ventricle has no regional wall motion abnormalities. The left ventricular internal cavity size was normal in size. There is  mild left ventricular hypertrophy. Left ventricular diastolic parameters are consistent with Grade I diastolic dysfunction (impaired relaxation). Elevated left atrial pressure. Right Ventricle: The right ventricular size is normal. Right vetricular wall thickness was not well visualized. Right ventricular systolic function is normal. There is normal pulmonary artery systolic pressure. The tricuspid regurgitant velocity is 2.61 m/s, and with an assumed right atrial pressure of 3 mmHg, the estimated right ventricular systolic pressure is 65.7 mmHg. Left Atrium: Left atrial size was  moderately dilated. Right Atrium: Right atrial size was mild to moderately dilated. Pericardium: There is no evidence of pericardial effusion. Mitral Valve: The mitral valve is normal in structure. No evidence of mitral valve regurgitation. No evidence of mitral valve stenosis. MV peak gradient, 6.4 mmHg. The mean mitral valve gradient is 2.0 mmHg. Tricuspid Valve: The tricuspid valve is abnormal. Tricuspid valve regurgitation is mild . No evidence of tricuspid stenosis. Aortic Valve: The aortic valve is tricuspid. Aortic valve regurgitation is not visualized. No  aortic stenosis is present. Aortic valve mean gradient measures 9.5 mmHg. Aortic valve peak gradient measures 20.0 mmHg. Aortic valve area, by VTI measures 1.83  cm. Pulmonic Valve: The pulmonic valve was not well visualized. Pulmonic valve regurgitation is not visualized. No evidence of pulmonic stenosis. Aorta: The aortic root is normal in size and structure. Venous: The inferior vena cava is normal in size with greater than 50% respiratory variability, suggesting right atrial pressure of 3 mmHg. IAS/Shunts: No atrial level shunt detected by color flow Doppler.  LEFT VENTRICLE PLAX 2D LVIDd:         5.20 cm   Diastology LVIDs:         2.60 cm   LV e' medial:    5.44 cm/s LV PW:         1.00 cm   LV E/e' medial:  16.9 LV IVS:        1.20 cm   LV e' lateral:   5.66 cm/s LVOT diam:     1.80 cm   LV E/e' lateral: 16.2 LV SV:         95 LV SV Index:   45 LVOT Area:     2.54 cm  RIGHT VENTRICLE RV Basal diam:  3.97 cm RV Mid diam:    3.40 cm RV S prime:     17.80 cm/s TAPSE (M-mode): 3.9 cm LEFT ATRIUM             Index        RIGHT ATRIUM           Index LA diam:        4.40 cm 2.11 cm/m   RA Area:     21.10 cm LA Vol (A2C):   94.2 ml 45.10 ml/m  RA Volume:   60.80 ml  29.11 ml/m LA Vol (A4C):   86.3 ml 41.31 ml/m LA Biplane Vol: 92.1 ml 44.09 ml/m  AORTIC VALVE                     PULMONIC VALVE AV Area (Vmax):    1.59 cm      PV Vmax:       1.47 m/s  AV Area (Vmean):   1.76 cm      PV Peak grad:  8.6 mmHg AV Area (VTI):     1.83 cm AV Vmax:           223.50 cm/s AV Vmean:          140.000 cm/s AV VTI:            0.520 m AV Peak Grad:      20.0 mmHg AV Mean Grad:      9.5 mmHg LVOT Vmax:         140.00 cm/s LVOT Vmean:        96.700 cm/s LVOT VTI:          0.373 m LVOT/AV VTI ratio: 0.72  AORTA Ao Root diam: 3.20 cm MITRAL VALVE                TRICUSPID VALVE MV Area (PHT): 2.31 cm     TR Peak grad:   27.2 mmHg MV Area VTI:   2.37 cm     TR Vmax:        261.00 cm/s MV Peak grad:  6.4 mmHg MV Mean grad:  2.0 mmHg     SHUNTS MV Vmax:       1.26 m/s  Systemic VTI:  0.37 m MV Vmean:      59.5 cm/s    Systemic Diam: 1.80 cm MV Decel Time: 329 msec MV E velocity: 91.70 cm/s MV A velocity: 106.00 cm/s MV E/A ratio:  0.87 Carlyle Dolly MD Electronically signed by Carlyle Dolly MD Signature Date/Time: 06/08/2022/10:47:53 AM    Final    DG Chest 2 View  Result Date: 06/07/2022 CLINICAL DATA:  Dyspnea, worsening today, atrial fibrillation EXAM: CHEST - 2 VIEW COMPARISON:  03/18/2021 chest radiograph. FINDINGS: Stable cardiomediastinal silhouette with normal heart size. No pneumothorax. No pleural effusion. Lungs appear clear, with no acute consolidative airspace disease and no pulmonary edema. IMPRESSION: No active cardiopulmonary disease. Electronically Signed   By: Ilona Sorrel M.D.   On: 06/07/2022 14:51    Disposition   Patient is being discharged home today in good condition.  Follow-up Plans & Appointments     Follow-up Information     Minus Breeding, MD Follow up on 06/16/2022.   Specialty: Cardiology Why: Keep scheduled follow-up for 06/16/2022 at 1:20 PM. Contact information: [place] [address] [phone]                  Discharge Medications   Allergies as of 06/09/2022   No Known Allergies      Medication List     STOP taking these medications    tirzepatide 7.5 MG/0.5ML Pen Commonly known  as: MOUNJARO       TAKE these medications    acetaminophen 500 MG tablet Commonly known as: TYLENOL Take 1,000 mg by mouth every 8 (eight) hours as needed for moderate pain.   albuterol 108 (90 Base) MCG/ACT inhaler Commonly known as: VENTOLIN HFA Inhale 2 puffs into the lungs every 6 (six) hours as needed for wheezing or shortness of breath.   amLODipine 10 MG tablet Commonly known as: NORVASC TAKE ONE TABLET BY MOUTH DAILY   blood glucose meter kit and supplies Dispense based on patient and insurance preference. Use up to four times daily as directed. (FOR ICD-10 E10.9, E11.9).   diclofenac Sodium 1 % Gel Commonly known as: Voltaren Apply 2 g topically 4 (four) times daily.   Eliquis 5 MG Tabs tablet Generic drug: apixaban TAKE ONE TABLET BY MOUTH TWICE DAILY   GNP Vitamin D 25 MCG (1000 UT) tablet Generic drug: Cholecalciferol Take 1 capsule (1,000 Units total) by mouth daily. What changed: See the new instructions.   lisinopril-hydrochlorothiazide 20-25 MG tablet Commonly known as: ZESTORETIC Take 1 tablet by mouth daily.   metoprolol succinate 50 MG 24 hr tablet Commonly known as: TOPROL-XL TAKE ONE TABLET BY MOUTH DAILY. TAKE WITH OR IMMEDIATELY FOLLOWING A MEAL. What changed: See the new instructions.   omeprazole 40 MG capsule Commonly known as: PRILOSEC Take 1 capsule (40 mg total) by mouth daily.   Ozempic (2 MG/DOSE) 8 MG/3ML Sopn Generic drug: Semaglutide (2 MG/DOSE) Inject 2 mg into the skin once a week. Thurday   promethazine-dextromethorphan 6.25-15 MG/5ML syrup Commonly known as: PROMETHAZINE-DM Take 5 mLs by mouth 4 (four) times daily as needed.   rosuvastatin 10 MG tablet Commonly known as: CRESTOR Take 10 mg by mouth daily. What changed: Another medication with the same name was removed. Continue taking this medication, and follow the directions you see here.   True Metrix Blood Glucose Test test strip Generic drug: glucose  blood SMARTSIG:Via Meter 1-4 Times Daily   TRUEplus Lancets 30G Misc USE UP TO FOUR TIMES DAILY  AS DIRECTED   Trulance 3 MG Tabs Generic drug: Plecanatide Take 3 mg by mouth daily.           Outstanding Labs/Studies   N/A.  Duration of Discharge Encounter   Greater than 30 minutes including physician time.  Signed, Darreld Mclean, PA-C 06/09/2022, 4:25 PM    ATTENDING ATTESTATION:  After conducting a review of all available clinical information with the care team, interviewing the patient, and performing a physical exam, I agree with the findings and plan described in this note.   GEN: No acute distress.   HEENT:  MMM, no JVD, no scleral icterus Cardiac: RRR, no murmurs, rubs, or gallops.  Respiratory: Clear to auscultation bilaterally. GI: Soft, nontender, non-distended  MS: No edema; No deformity. Neuro:  Nonfocal  Vasc:  +2 radial pulses  Patient examined and interviewed prior to coronary angiography study.  Coronary angiography was pursued due to unstable anginal symptoms despite negative stress testing.  Coronary angiography demonstrated no obstructive disease.  Following TR band removal the patient is appropriate for discharge and follow-up with PCP to evaluate for noncardiac etiologies producing chest pain.  Lenna Sciara, MD Pager [phone]

## 2022-06-09 NOTE — Progress Notes (Signed)
Patient and wife was given discharge instructions. Both verbalized understanding. 

## 2022-06-09 NOTE — Telephone Encounter (Signed)
Provided patient and her husband a work note at discharge. Thank you!

## 2022-06-09 NOTE — Interval H&P Note (Signed)
History and Physical Interval Note:  06/09/2022 1:08 PM  Kathryn Merritt  has presented today for surgery, with the diagnosis of [place].  The various methods of treatment have been discussed with the patient and family. After consideration of risks, benefits and other options for treatment, the patient has consented to  Procedure(s): LEFT HEART CATH AND CORONARY ANGIOGRAPHY (N/A) as a surgical intervention.  The patient's history has been reviewed, patient examined, no change in status, stable for surgery.  I have reviewed the patient's chart and labs.  Questions were answered to the patient's satisfaction.     Early Osmond

## 2022-06-09 NOTE — Progress Notes (Signed)
Received patient from [facility] via [place]. Skin warm and dry , resp even and unlabored, and denies any chest pain or discomfort at this time.  Pt placed on monitor and consent signed.  IVF resume in right [facility].  Dr Dixie Dials in to see patient.

## 2022-06-09 NOTE — H&P (View-Only) (Signed)
Rounding Note    Patient Name: Kathryn Merritt Date of Encounter: 06/09/2022  [place] Cardiologist: Minus Breeding, MD   Subjective   She did have brief chest discomfort overnight but this spontaneously resolved. Breathing at baseline. She has been NPO since midnight for her catheterization scheduled later today. Husband at the bedside. Remaining questions answered.   Inpatient Medications    Scheduled Meds:  amLODipine  10 mg Oral Daily   aspirin  81 mg Oral Pre-Cath   [START ON 06/10/2022] aspirin EC  81 mg Oral Daily   insulin aspart  0-9 Units Subcutaneous TID WC   lisinopril  20 mg Oral Daily   metoprolol succinate  50 mg Oral Daily   pantoprazole  40 mg Oral Daily   rosuvastatin  20 mg Oral Daily   sodium chloride flush  3 mL Intravenous Q12H   Continuous Infusions:  sodium chloride     Followed by   sodium chloride     PRN Meds: acetaminophen **OR** acetaminophen, ondansetron **OR** ondansetron (ZOFRAN) IV, polyethylene glycol   Vital Signs    Vitals:   06/08/22 1018 06/08/22 1424 06/08/22 2120 06/09/22 0410  BP: (!) 140/68 124/69 (!) 106/54 (!) 109/45  Pulse: 64 (!) 59 60 (!) 57  Resp: '19 16 19 15  '$ Temp: 98.2 F (36.8 C) (!) 97.5 F (36.4 C) (!) 97.2 F (36.2 C) (!) 97 F (36.1 C)  TempSrc: Oral     SpO2: 97% 96% 97% 97%  Weight:      Height:        Intake/Output Summary (Last 24 hours) at 06/09/2022 0836 Last data filed at 06/09/2022 7124 Gross per 24 hour  Intake 486 ml  Output --  Net 486 ml      06/07/2022   10:17 PM 06/07/2022    2:17 PM 04/14/2022    3:27 PM  Last 3 Weights  Weight (lbs) 239 lb 13.8 oz 240 lb 247 lb  Weight (kg) 108.8 kg 108.863 kg 112.038 kg      Telemetry    NSR, HR in 60's to 70's. Occasional PVC's and couplets.  - Personally Reviewed  ECG    NSR, HR 63 with RBBB.  - Personally Reviewed  Physical Exam   GEN: Pleasant female appearing in no acute distress.   Neck: No JVD Cardiac: RRR, no  murmurs, rubs, or gallops.  Respiratory: Clear to auscultation bilaterally without wheezing or rales. GI: Soft, nontender, non-distended  MS: No pitting edema; No deformity. Neuro:  Nonfocal  Psych: Normal affect   Labs    High Sensitivity Troponin:   Recent Labs  Lab 06/07/22 1439 06/07/22 1630  TROPONINIHS 7 9     Chemistry Recent Labs  Lab 06/07/22 1439  NA 139  K 3.7  CL 106  CO2 26  GLUCOSE 101*  BUN 22*  CREATININE 0.92  CALCIUM 10.0  GFRNONAA >60  ANIONGAP 7    Lipids  Recent Labs  Lab 06/09/22 0520  CHOL 137  TRIG 100  HDL 41  LDLCALC 76  CHOLHDL 3.3    Hematology Recent Labs  Lab 06/07/22 1439  WBC 8.1  RBC 4.66  HGB 13.7  HCT 41.8  MCV 89.7  MCH 29.4  MCHC 32.8  RDW 14.6  PLT 249   Thyroid No results for input(s): "TSH", "FREET4" in the last 168 hours.  BNPNo results for input(s): "BNP", "PROBNP" in the last 168 hours.  DDimer No results for input(s): "DDIMER" in the  last 168 hours.   Radiology     DG Chest 2 View  Result Date: 06/07/2022 CLINICAL DATA:  Dyspnea, worsening today, atrial fibrillation EXAM: CHEST - 2 VIEW COMPARISON:  03/18/2021 chest radiograph. FINDINGS: Stable cardiomediastinal silhouette with normal heart size. No pneumothorax. No pleural effusion. Lungs appear clear, with no acute consolidative airspace disease and no pulmonary edema. IMPRESSION: No active cardiopulmonary disease. Electronically Signed   By: Ilona Sorrel M.D.   On: 06/07/2022 14:51    Cardiac Studies   Echocardiogram: 06/08/2022 IMPRESSIONS     1. Left ventricular ejection fraction, by estimation, is 65 to 70%. The  left ventricle has normal function. The left ventricle has no regional  wall motion abnormalities. There is mild left ventricular hypertrophy.  Left ventricular diastolic parameters  are consistent with Grade I diastolic dysfunction (impaired relaxation).  Elevated left atrial pressure.   2. Right ventricular systolic function is  normal. The right ventricular  size is normal. There is normal pulmonary artery systolic pressure.   3. Left atrial size was moderately dilated.   4. Right atrial size was mild to moderately dilated.   5. The mitral valve is normal in structure. No evidence of mitral valve  regurgitation. No evidence of mitral stenosis.   6. The tricuspid valve is abnormal.   7. The aortic valve is tricuspid. Aortic valve regurgitation is not  visualized. No aortic stenosis is present.   8. The inferior vena cava is normal in size with greater than 50%  respiratory variability, suggesting right atrial pressure of 3 mmHg.    Patient Profile     61 y.o. female w/ PMH of persistent atrial fibrillation (s/p DCCV in 02/2021, recurrence in 11/2021 with successful DCCV while in the ED), OSA, HTN, HLD, Type 2 DM and GERD who is currently admitted for evaluation of chest pain.   Assessment & Plan    1. Chest Pain concerning for Accelerating Angina - Presented with worsening chest pain with exertion for the past 1-2 weeks and resolves with rest, overall concerning for accelerating angina. She has ruled out for ACS as Hs Troponin values have been negative at 7 and 9 with EKG showing her known RBBB. Echo shows a preserved EF of 65-70% with no regional WMA and no significant valve abnormalities.  - As discussed in the initial consult note, a cardiac catheterization has been recommended for definitive evaluation given her presenting symptoms, risk factors and body habitus (she did have a NST in 03/2021 which was low-risk). Risks and benefits of a catheterization have been reviewed and she is in agreement to proceed. Scheduled for later today. She will be on the Cardiology service [facility] at [facility].  - Continue ASA '81mg'$  daily, Toprol-XL '50mg'$  daily and Crestor (with dose adjustment as outlined below).    2. Persistent Atrial Fibrillation - She underwent DCCV in 02/2021 and had recurrence in 11/2021 with successful DCCV  while in the ED. She has maintained NSR this admission. Remains on Toprol-XL 50 mg daily for rate-control.  - No reports of active bleeding. She was on Eliquis prior to admission which is currently held in anticipation of her catheterization (last dose on 9/18 at 2241). Would plan to resume following her catheterization.   3. OSA - Continued compliance with CPAP encouraged. Followed by Dr. Radford Pax as an outpatient.    4. HTN - BP has been stable from 106/54 - 140/68 in the past 24 hours. Continue Amlodipine, Lisinopril and Toprol-XL. HCTZ held for catheterization today.  5. HLD - LDL was at 64 in 09/2021 and is at 76 this admission. Will titrate Crestor from '10mg'$  daily to '20mg'$  daily. Recheck FLP and LFT's in 6-8 weeks.  For questions or updates, please contact [place] Please consult [email or web address] for contact info under        Signed, Erma Heritage, PA-C  06/09/2022, 8:36 AM    Sitting on edge of bed, husband in room.  Ready for heart catheterization.  No active chest discomfort.  Regular rate and rhythm, lungs are clear  Troponins were normal.  Prior nuclear stress test in July 2022 was low risk.  EKG shows right bundle branch block  Assessment and plan:  Chest pain concerning for accelerating angina - Heart catheterization today.  She will be transported to [place] and benefits explained.  Willing to proceed.  Right bundle branch block - Chronic.  Persistent atrial fibrillation - Cardioversion in June 2022 as well as March or 2023 last one being in the emergency department.  Sinus rhythm currently. - Toprol-XL 50 - Eliquis is being held   Obstructive sleep apnea - Continue CPAP.  Dr. Radford Pax  Hyperlipidemia - Agree with increasing Crestor to 20 mg a day.  Last LDL 76.  LDL goal less than 70.  Morbid obesity - Continue to encourage weight loss.  Candee Furbish, MD

## 2022-06-09 NOTE — Progress Notes (Addendum)
Rounding Note    Patient Name: Kathryn Merritt Date of Encounter: 06/09/2022  [place] Cardiologist: Minus Breeding, MD   Subjective   She did have brief chest discomfort overnight but this spontaneously resolved. Breathing at baseline. She has been NPO since midnight for her catheterization scheduled later today. Husband at the bedside. Remaining questions answered.   Inpatient Medications    Scheduled Meds:  amLODipine  10 mg Oral Daily   aspirin  81 mg Oral Pre-Cath   [START ON 06/10/2022] aspirin EC  81 mg Oral Daily   insulin aspart  0-9 Units Subcutaneous TID WC   lisinopril  20 mg Oral Daily   metoprolol succinate  50 mg Oral Daily   pantoprazole  40 mg Oral Daily   rosuvastatin  20 mg Oral Daily   sodium chloride flush  3 mL Intravenous Q12H   Continuous Infusions:  sodium chloride     Followed by   sodium chloride     PRN Meds: acetaminophen **OR** acetaminophen, ondansetron **OR** ondansetron (ZOFRAN) IV, polyethylene glycol   Vital Signs    Vitals:   06/08/22 1018 06/08/22 1424 06/08/22 2120 06/09/22 0410  BP: (!) 140/68 124/69 (!) 106/54 (!) 109/45  Pulse: 64 (!) 59 60 (!) 57  Resp: '19 16 19 15  '$ Temp: 98.2 F (36.8 C) (!) 97.5 F (36.4 C) (!) 97.2 F (36.2 C) (!) 97 F (36.1 C)  TempSrc: Oral     SpO2: 97% 96% 97% 97%  Weight:      Height:        Intake/Output Summary (Last 24 hours) at 06/09/2022 0836 Last data filed at 06/09/2022 1610 Gross per 24 hour  Intake 486 ml  Output --  Net 486 ml      06/07/2022   10:17 PM 06/07/2022    2:17 PM 04/14/2022    3:27 PM  Last 3 Weights  Weight (lbs) 239 lb 13.8 oz 240 lb 247 lb  Weight (kg) 108.8 kg 108.863 kg 112.038 kg      Telemetry    NSR, HR in 60's to 70's. Occasional PVC's and couplets.  - Personally Reviewed  ECG    NSR, HR 63 with RBBB.  - Personally Reviewed  Physical Exam   GEN: Pleasant female appearing in no acute distress.   Neck: No JVD Cardiac: RRR, no  murmurs, rubs, or gallops.  Respiratory: Clear to auscultation bilaterally without wheezing or rales. GI: Soft, nontender, non-distended  MS: No pitting edema; No deformity. Neuro:  Nonfocal  Psych: Normal affect   Labs    High Sensitivity Troponin:   Recent Labs  Lab 06/07/22 1439 06/07/22 1630  TROPONINIHS 7 9     Chemistry Recent Labs  Lab 06/07/22 1439  NA 139  K 3.7  CL 106  CO2 26  GLUCOSE 101*  BUN 22*  CREATININE 0.92  CALCIUM 10.0  GFRNONAA >60  ANIONGAP 7    Lipids  Recent Labs  Lab 06/09/22 0520  CHOL 137  TRIG 100  HDL 41  LDLCALC 76  CHOLHDL 3.3    Hematology Recent Labs  Lab 06/07/22 1439  WBC 8.1  RBC 4.66  HGB 13.7  HCT 41.8  MCV 89.7  MCH 29.4  MCHC 32.8  RDW 14.6  PLT 249   Thyroid No results for input(s): "TSH", "FREET4" in the last 168 hours.  BNPNo results for input(s): "BNP", "PROBNP" in the last 168 hours.  DDimer No results for input(s): "DDIMER" in the  last 168 hours.   Radiology     DG Chest 2 View  Result Date: 06/07/2022 CLINICAL DATA:  Dyspnea, worsening today, atrial fibrillation EXAM: CHEST - 2 VIEW COMPARISON:  03/18/2021 chest radiograph. FINDINGS: Stable cardiomediastinal silhouette with normal heart size. No pneumothorax. No pleural effusion. Lungs appear clear, with no acute consolidative airspace disease and no pulmonary edema. IMPRESSION: No active cardiopulmonary disease. Electronically Signed   By: Ilona Sorrel M.D.   On: 06/07/2022 14:51    Cardiac Studies   Echocardiogram: 06/08/2022 IMPRESSIONS     1. Left ventricular ejection fraction, by estimation, is 65 to 70%. The  left ventricle has normal function. The left ventricle has no regional  wall motion abnormalities. There is mild left ventricular hypertrophy.  Left ventricular diastolic parameters  are consistent with Grade I diastolic dysfunction (impaired relaxation).  Elevated left atrial pressure.   2. Right ventricular systolic function is  normal. The right ventricular  size is normal. There is normal pulmonary artery systolic pressure.   3. Left atrial size was moderately dilated.   4. Right atrial size was mild to moderately dilated.   5. The mitral valve is normal in structure. No evidence of mitral valve  regurgitation. No evidence of mitral stenosis.   6. The tricuspid valve is abnormal.   7. The aortic valve is tricuspid. Aortic valve regurgitation is not  visualized. No aortic stenosis is present.   8. The inferior vena cava is normal in size with greater than 50%  respiratory variability, suggesting right atrial pressure of 3 mmHg.    Patient Profile     61 y.o. female w/ PMH of persistent atrial fibrillation (s/p DCCV in 02/2021, recurrence in 11/2021 with successful DCCV while in the ED), OSA, HTN, HLD, Type 2 DM and GERD who is currently admitted for evaluation of chest pain.   Assessment & Plan    1. Chest Pain concerning for Accelerating Angina - Presented with worsening chest pain with exertion for the past 1-2 weeks and resolves with rest, overall concerning for accelerating angina. She has ruled out for ACS as Hs Troponin values have been negative at 7 and 9 with EKG showing her known RBBB. Echo shows a preserved EF of 65-70% with no regional WMA and no significant valve abnormalities.  - As discussed in the initial consult note, a cardiac catheterization has been recommended for definitive evaluation given her presenting symptoms, risk factors and body habitus (she did have a NST in 03/2021 which was low-risk). Risks and benefits of a catheterization have been reviewed and she is in agreement to proceed. Scheduled for later today. She will be on the Cardiology service once at [facility].  - Continue ASA '81mg'$  daily, Toprol-XL '50mg'$  daily and Crestor (with dose adjustment as outlined below).    2. Persistent Atrial Fibrillation - She underwent DCCV in 02/2021 and had recurrence in 11/2021 with successful DCCV  while in the ED. She has maintained NSR this admission. Remains on Toprol-XL 50 mg daily for rate-control.  - No reports of active bleeding. She was on Eliquis prior to admission which is currently held in anticipation of her catheterization (last dose on 9/18 at 2241). Would plan to resume following her catheterization.   3. OSA - Continued compliance with CPAP encouraged. Followed by Dr. Radford Pax as an outpatient.    4. HTN - BP has been stable from 106/54 - 140/68 in the past 24 hours. Continue Amlodipine, Lisinopril and Toprol-XL. HCTZ held for catheterization today.  5. HLD - LDL was at 64 in 09/2021 and is at 76 this admission. Will titrate Crestor from '10mg'$  daily to '20mg'$  daily. Recheck FLP and LFT's in 6-8 weeks.  For questions or updates, please contact [place] Please consult [email or web address] for contact info under        Signed, Erma Heritage, PA-C  06/09/2022, 8:36 AM    Sitting on edge of bed, husband in room.  Ready for heart catheterization.  No active chest discomfort.  Regular rate and rhythm, lungs are clear  Troponins were normal.  Prior nuclear stress test in July 2022 was low risk.  EKG shows right bundle branch block  Assessment and plan:  Chest pain concerning for accelerating angina - Heart catheterization today.  She will be transported to [place] and benefits explained.  Willing to proceed.  Right bundle branch block - Chronic.  Persistent atrial fibrillation - Cardioversion in June 2022 as well as March or 2023 last one being in the emergency department.  Sinus rhythm currently. - Toprol-XL 50 - Eliquis is being held   Obstructive sleep apnea - Continue CPAP.  Dr. Radford Pax  Hyperlipidemia - Agree with increasing Crestor to 20 mg a day.  Last LDL 76.  LDL goal less than 70.  Morbid obesity - Continue to encourage weight loss.  Candee Furbish, MD

## 2022-06-09 NOTE — Discharge Instructions (Addendum)
May return to work on Friday, September 22.  Do not use right hand to lift anything for 24 hours.  No lifting more than 5 pounds for 5 days.  Drink plenty of fluid for 48 hours and keep wrist elevated at heart level for 24 hours  Radial Site Care   This sheet gives you information about how to care for yourself after your procedure. Your health care provider may also give you more specific instructions. If you have problems or questions, contact your health care provider. What can I expect after the procedure? After the procedure, it is common to have: Bruising and tenderness at the catheter insertion area. Follow these instructions at home: Medicines Take over-the-counter and prescription medicines only as told by your health care provider. Insertion site care Follow instructions from your health care provider about how to take care of your insertion site. Make sure you: Wash your hands with soap and water before you change your bandage (dressing). If soap and water are not available, use hand sanitizer. remove your dressing as told by your health care provider. In 24 hours Check your insertion site every day for signs of infection. Check for: Redness, swelling, or pain. Fluid or blood. Pus or a bad smell. Warmth. Do not take baths, swim, or use a hot tub until your health care provider approves. You may shower 24-48 hours after the procedure, or as directed by your health care provider. Remove the dressing and gently wash the site with plain soap and water. Pat the area dry with a clean towel. Do not rub the site. That could cause bleeding. Do not apply powder or lotion to the site. Activity   For 24 hours after the procedure, or as directed by your health care provider: Do not flex or bend the affected arm. Do not push or pull heavy objects with the affected arm. Do not drive yourself home from the hospital or clinic. You may drive 24 hours after the procedure unless your  health care provider tells you not to. Do not operate machinery or power tools. Do not lift anything that is heavier than 10 lb (4.5 kg), or the limit that you are told, until your health care provider says that it is safe. For 4 days Ask your health care provider when it is okay to: Return to work or school. Resume usual physical activities or sports. Resume sexual activity. General instructions If the catheter site starts to bleed, raise your arm and put firm pressure on the site. If the bleeding does not stop, get help right away. This is a medical emergency. If you went home on the same day as your procedure, a responsible adult should be with you for the first 24 hours after you arrive home. Keep all follow-up visits as told by your health care provider. This is important. Contact a health care provider if: You have a fever. You have redness, swelling, or yellow drainage around your insertion site. Get help right away if: You have unusual pain at the radial site. The catheter insertion area swells very fast. The insertion area is bleeding, and the bleeding does not stop when you hold steady pressure on the area. Your arm or hand becomes pale, cool, tingly, or numb. These symptoms may represent a serious problem that is an emergency. Do not wait to see if the symptoms will go away. Get medical help right away. Call your local emergency services (911 in the [place] Do not drive yourself to the  hospital. Summary After the procedure, it is common to have bruising and tenderness at the site. Follow instructions from your health care provider about how to take care of your radial site wound. Check the wound every day for signs of infection. Do not lift anything that is heavier than 10 lb (4.5 kg), or the limit that you are told, until your health care provider says that it is safe. This information is not intended to replace advice given to you by your health care provider. Make sure you  discuss any questions you have with your health care provider. Document Revised: 10/12/2017 Document Reviewed: 10/12/2017 Elsevier Patient Education  2020 [employer].

## 2022-06-09 NOTE — Progress Notes (Signed)
Cath lab contacted and they will be contacting care link when they're ready for patient.

## 2022-06-09 NOTE — Telephone Encounter (Signed)
Patient requesting a letter for her husband's work stating she was in the hospital and how long she has been in. She states she spoke with Dr. Harl Bowie and [place] while she was in the hospital.

## 2022-06-09 NOTE — Telephone Encounter (Signed)
Routed to [place] PA who is working on discharge summary

## 2022-06-10 ENCOUNTER — Encounter (HOSPITAL_COMMUNITY): Payer: Self-pay | Admitting: Internal Medicine

## 2022-06-15 NOTE — Progress Notes (Unsigned)
Cardiology Office Note   Date:  06/16/2022   ID:  DIEGO DELANCEY, DOB 1961/07/16, MRN [id number]  PCP:  Renee Rival, FNP  Cardiologist:   Minus Breeding, MD   Chief Complaint  Patient presents with   Chest Pain       History of Present Illness: Kathryn Merritt is a 61 y.o. female who presents for she was recently admitted 02/2021 with new onset atrial fibrillation starting around 0200 day of admission. They could not exclude atypical atrial flutter per notes. She was started on IV diltiazem but underwent DCCV 03/18/21 with successful conversion to NSR. She was started on Eliquis and Toprol. She was also noted to have mildly elevated troponin values felt c/w demand ischemia (also had hypertensive urgency during admission). 2D echo 03/18/21 EF 65-70%, moderate LVH, normal diastolic parameters, normal RV, mild LAE.  She was in the ED in March 2023.  She had DCCV.    She was in the hospital with chest pain in Sept and and had no disease on cath.  EF was well preserved on echo.    The patient continues to have chest discomfort at rest.  It is shooting.  It is sharp.  Does not radiate.  There is no precipitating cause.  She does not think it is like her previous reflux.  She feels fatigued.  She still has to do a lot of activities with her job and at home.  Not having any new complaints compared to the hospitalization.     Past Medical History:  Diagnosis Date   Chronic back pain    Diabetes mellitus    Edema leg    Hyperlipidemia    Hypertension    Joint pain    Kidney stones    Other malaise and fatigue    PAF (paroxysmal atrial fibrillation) ([place])    a. new diagnosis in 02/2021 --> s/p DCCV on 03/18/2021   RBBB    Sinus bradycardia    Sleep apnea     Past Surgical History:  Procedure Laterality Date   BALLOON DILATION N/A 02/22/2022   Procedure: BALLOON DILATION;  Surgeon: Eloise Harman, DO;  [facility];  Service: Endoscopy;  Laterality: N/A;   BIOPSY   02/22/2022   Procedure: BIOPSY;  Surgeon: Eloise Harman, DO;  [facility];  Service: Endoscopy;;   CARDIOVERSION N/A 03/18/2021   Procedure: CARDIOVERSION;  Surgeon: Satira Sark, MD;  Location: [facility];  Service: Cardiovascular;  Laterality: N/A;   CESAREAN SECTION     times 2   COLONOSCOPY WITH PROPOFOL N/A 02/22/2022   Procedure: COLONOSCOPY WITH PROPOFOL;  Surgeon: Eloise Harman, DO;  [facility];  Service: Endoscopy;  Laterality: N/A;  7:30am   CORONARY ANGIOGRAPHY N/A 06/09/2022   Procedure: CORONARY ANGIOGRAPHY;  Surgeon: Early Osmond, MD;  Location: [place] [facility];  Service: Cardiovascular;  Laterality: N/A;   ESOPHAGOGASTRODUODENOSCOPY (EGD) WITH PROPOFOL N/A 02/22/2022   Procedure: ESOPHAGOGASTRODUODENOSCOPY (EGD) WITH PROPOFOL;  Surgeon: Eloise Harman, DO;  [facility];  Service: Endoscopy;  Laterality: N/A;   INCISION AND DRAINAGE ABSCESS Right    axilla   LITHOTRIPSY     POLYPECTOMY  02/22/2022   Procedure: POLYPECTOMY;  Surgeon: Eloise Harman, DO;  [facility];  Service: Endoscopy;;     Current Outpatient Medications  Medication Sig Dispense Refill   acetaminophen (TYLENOL) 500 MG tablet Take 1,000 mg by mouth  every 8 (eight) hours as needed for moderate pain.     albuterol (VENTOLIN HFA) 108 (90 Base) MCG/ACT inhaler Inhale 2 puffs into the lungs every 6 (six) hours as needed for wheezing or shortness of breath. 8.5 g 0   amLODipine (NORVASC) 10 MG tablet TAKE ONE TABLET BY MOUTH DAILY 90 tablet 3   diclofenac Sodium (VOLTAREN) 1 % GEL Apply 2 g topically 4 (four) times daily. 150 g 2   ELIQUIS 5 MG TABS tablet TAKE ONE TABLET BY MOUTH TWICE DAILY 60 tablet 5   GNP VITAMIN D 25 MCG (1000 UT) tablet Take 1 capsule (1,000 Units total) by mouth daily. (Patient taking differently: Take 1,000 Units by mouth daily.) 100 tablet 2   lisinopril-hydrochlorothiazide (ZESTORETIC) 20-25 MG tablet Take 1 tablet by mouth  daily. 60 tablet 2   metoprolol succinate (TOPROL-XL) 50 MG 24 hr tablet TAKE ONE TABLET BY MOUTH DAILY. TAKE WITH OR IMMEDIATELY FOLLOWING A MEAL. (Patient taking differently: Take 50 mg by mouth daily.) 90 tablet 3   omeprazole (PRILOSEC) 40 MG capsule Take 1 capsule (40 mg total) by mouth daily. 30 capsule 3   Plecanatide (TRULANCE) 3 MG TABS Take 3 mg by mouth daily. 30 tablet 5   rosuvastatin (CRESTOR) 10 MG tablet Take 10 mg by mouth daily.     Semaglutide, 2 MG/DOSE, (OZEMPIC, 2 MG/DOSE,) 8 MG/3ML SOPN Inject 2 mg into the skin once a week. Thurday     blood glucose meter kit and supplies Dispense based on patient and insurance preference. Use up to four times daily as directed. (FOR ICD-10 E10.9, E11.9). 1 each 0   promethazine-dextromethorphan (PROMETHAZINE-DM) 6.25-15 MG/5ML syrup Take 5 mLs by mouth 4 (four) times daily as needed. (Patient not taking: Reported on 06/16/2022) 100 mL 0   TRUE METRIX BLOOD GLUCOSE TEST test strip SMARTSIG:Via Meter 1-4 Times Daily     TRUEplus Lancets 30G MISC USE UP TO FOUR TIMES DAILY AS DIRECTED     No current facility-administered medications for this visit.    Allergies:   Patient has no known allergies.    ROS:  Please see the history of present illness.   Otherwise, review of systems are positive for none.   All other systems are reviewed and negative.    PHYSICAL EXAM: VS:  BP 110/78   Pulse 64   Ht _0  (1.6 m)   Wt 240 lb (108.9 kg)   BMI 42.51 kg/m  , BMI Body mass index is 42.51 kg/m. GENERAL:  Well appearing NECK:  No jugular venous distention, waveform within normal limits, carotid upstroke brisk and symmetric, no bruits, no thyromegaly LUNGS:  Clear to auscultation bilaterally CHEST:  Unremarkable HEART:  PMI not displaced or sustained,S1 and S2 within normal limits, no S3, no S4, no clicks, no rubs, no murmurs ABD:  Flat, positive bowel sounds normal in frequency in pitch, no bruits, no rebound, no guarding, no midline  pulsatile mass, no hepatomegaly, no splenomegaly EXT:  2 plus pulses throughout, no edema, no cyanosis no clubbing   EKG:  EKG not ordered today. NA   Recent Labs: 10/23/2021: TSH 3.690 12/31/2021: ALT 19 06/07/2022: BUN 22; Creatinine, Ser 0.92; Hemoglobin 13.7; Platelets 249; Potassium 3.7; Sodium 139    Lipid Panel    Component Value Date/Time   CHOL 137 06/09/2022 0520   CHOL 137 09/23/2021 1223   TRIG 100 06/09/2022 0520   HDL 41 06/09/2022 0520   HDL 45 09/23/2021 1223   CHOLHDL  3.3 06/09/2022 0520   VLDL 20 06/09/2022 0520   LDLCALC 76 06/09/2022 0520   LDLCALC 64 09/23/2021 1223      Wt Readings from Last 3 Encounters:  06/16/22 240 lb (108.9 kg)  06/07/22 239 lb 13.8 oz (108.8 kg)  04/14/22 247 lb (112 kg)      Other studies Reviewed: Additional studies/ records that were reviewed today include:   Hospital records Review of the above records demonstrates:  Please see elsewhere in the note.     ASSESSMENT AND PLAN:   Chest pain: The patient's chest discomfort is nonanginal.  I asked her to discuss this further with her primary care provider.  I do not suspect that this is coronary spasm or microvascular disease.  I would have him pursue noncardiac etiologies.  Paroxysmal atrial fibrillation:   Kathryn Merritt has a CHA2DS2 - VASc score of 3.  She tolerates anticoagulation.  No change in therapy.  Essential HTN:   Blood pressure is at target.  No change in therapy.  DM: A1c was 6.5.  Continue meds as listed.  Morbid obesity:   She has had some weight loss and I encouraged more the same.   Sleep apnea:   She denies using CPAP although she has not had much symptomatic improvement as far she is concerned.  She is persisting.    Current medicines are reviewed at length with the patient today.  The patient does not have concerns regarding medicines.  The following changes have been made: None  Labs/ tests ordered today include: None  No orders of the  defined types were placed in this encounter.     Disposition:   FU with me 12 months   Signed, Minus Breeding, MD  06/16/2022 1:34 PM    [place]

## 2022-06-16 ENCOUNTER — Ambulatory Visit: Payer: 59 | Admitting: Cardiology

## 2022-06-16 ENCOUNTER — Encounter: Payer: Self-pay | Admitting: Cardiology

## 2022-06-16 VITALS — BP 110/78 | HR 64 | Ht 63.0 in | Wt 240.0 lb

## 2022-06-16 DIAGNOSIS — I10 Essential (primary) hypertension: Secondary | ICD-10-CM

## 2022-06-16 DIAGNOSIS — I480 Paroxysmal atrial fibrillation: Secondary | ICD-10-CM

## 2022-06-16 DIAGNOSIS — G4730 Sleep apnea, unspecified: Secondary | ICD-10-CM

## 2022-06-16 DIAGNOSIS — R42 Dizziness and giddiness: Secondary | ICD-10-CM | POA: Diagnosis not present

## 2022-06-16 DIAGNOSIS — E118 Type 2 diabetes mellitus with unspecified complications: Secondary | ICD-10-CM

## 2022-06-16 NOTE — Patient Instructions (Signed)
Medication Instructions:  The current medical regimen is effective;  continue present plan and medications.  *If you need a refill on your cardiac medications before your next appointment, please call your pharmacy*  Follow-Up: At [facility], you and your health needs are our priority.  As part of our continuing mission to provide you with exceptional heart care, we have created designated Provider Care Teams.  These Care Teams include your primary Cardiologist (physician) and Advanced Practice Providers (APPs -  Physician Assistants and Nurse Practitioners) who all work together to provide you with the care you need, when you need it.  We recommend signing up for the patient portal called "MyChart".  Sign up information is provided on this After Visit Summary.  MyChart is used to connect with patients for Virtual Visits (Telemedicine).  Patients are able to view lab/test results, encounter notes, upcoming appointments, etc.  Non-urgent messages can be sent to your provider as well.   To learn more about what you can do with MyChart, go to [email or web address].    Your next appointment:   1 year(s)  The format for your next appointment:   In Person  Provider:   James Hochrein, MD     Important Information About Sugar       

## 2022-06-17 ENCOUNTER — Other Ambulatory Visit (HOSPITAL_COMMUNITY): Payer: Self-pay

## 2022-06-17 ENCOUNTER — Telehealth: Payer: Self-pay

## 2022-06-17 ENCOUNTER — Other Ambulatory Visit (INDEPENDENT_AMBULATORY_CARE_PROVIDER_SITE_OTHER): Payer: Self-pay | Admitting: Gastroenterology

## 2022-06-17 NOTE — Telephone Encounter (Signed)
Patient Advocate Encounter  Received notification from [employer] that the request for prior authorization for Ozempic (2 MG/DOSE) '8MG'$ /3ML pen-injectors has been denied due to The member has not tried and failed the required number of formulary alternatives.  Formulary alternative(s) are Victoza, Trulicity. Requirement: 3 in a class with 3 or more alternatives, 2 in a class with 2 alternatives, or 1 in a class with only 1 alternative.   Note: Formulary alternatives may require a prior authorization.Marland Kitchen

## 2022-06-18 NOTE — Telephone Encounter (Signed)
Routing to appeal team

## 2022-06-18 NOTE — Telephone Encounter (Signed)
She was on Mounjaro previously but then changed over to Ozempic due to insurance preferences.  This is a continuation of her current therapy just higher dosing.  She needs the potent weight loss benefits that Ozempic provides as Trulicity and Victoza are not as effective with her current BMI over 40.

## 2022-06-19 ENCOUNTER — Other Ambulatory Visit: Payer: Self-pay | Admitting: Nurse Practitioner

## 2022-06-21 ENCOUNTER — Other Ambulatory Visit (HOSPITAL_COMMUNITY): Payer: Self-pay

## 2022-06-21 NOTE — Telephone Encounter (Signed)
I would rather just go ahead with the appeal for Ozempic.  She has morbid obesity and is a high risk for ASCVD, thus making GLP1 like Ozempic (which has the cardiovascular data to back it up) preferable over Victoza or Trulicity.

## 2022-06-21 NOTE — Telephone Encounter (Signed)
I see that there was a msg abt getting a pa for mounjaro and ozempic. Would you like [place] to request the PA for [facility] or go ahead with the appeal for ozempic. Kathryn Merritt will likely be denied for the same reasons, but I wasn't sure which you'd rather her have.

## 2022-06-21 NOTE — Telephone Encounter (Signed)
F/u    The patient calling back checking on prior authorization for Ozempic and Mounjaro either one of the  medication is fine.    Patient is asking for call back today to discuss.

## 2022-06-21 NOTE — Telephone Encounter (Signed)
Noted  

## 2022-06-21 NOTE — Telephone Encounter (Signed)
Routed to PA team

## 2022-06-21 NOTE — Telephone Encounter (Signed)
The Pharmacy called and said since she does have new insurance these medications both need a PA. She said it is in cover my meds. The patient has [employer] now , not [employer]   Key number for Ozempic [id number] Key Number for Cora Daniels [id number] , if this does not wotrk [id number]

## 2022-06-22 ENCOUNTER — Telehealth: Payer: Self-pay

## 2022-06-22 MED ORDER — LACTULOSE 10 GM/15ML PO SOLN: 20.0000 g | Freq: Two times a day (BID) | ORAL | 3 refills | Status: DC

## 2022-06-22 NOTE — Telephone Encounter (Signed)
Unfortunately she did, she went from [facility] to [facility]. I mailed the copay card for Trulance to her to take to her pharmacy.

## 2022-06-22 NOTE — Telephone Encounter (Signed)
Did she get new insurance? Previous PA for Trulance was good through 12/2022.   I can send in lactulose for back up if she is not able to get trulance.

## 2022-06-22 NOTE — Telephone Encounter (Signed)
Patient's insurance has denied the PA for Trulance. The only thing they cover is lactulose. Pt is wanting to try lactulose until she can see if a copay card for Trulance will work at her pharmacy with her insurance.

## 2022-06-22 NOTE — Addendum Note (Signed)
Addended by: Mahala Menghini on: 06/22/2022 11:56 AM   Modules accepted: Orders

## 2022-06-22 NOTE — Telephone Encounter (Signed)
F/u   The patient called [employer] this morning was told she was denied for Ozempic did not meet criteria more information as to why she is needing the medication.

## 2022-06-23 ENCOUNTER — Encounter: Payer: Self-pay | Admitting: Nurse Practitioner

## 2022-06-23 ENCOUNTER — Telehealth: Payer: Self-pay

## 2022-06-23 ENCOUNTER — Ambulatory Visit (INDEPENDENT_AMBULATORY_CARE_PROVIDER_SITE_OTHER): Payer: 59

## 2022-06-23 ENCOUNTER — Ambulatory Visit (INDEPENDENT_AMBULATORY_CARE_PROVIDER_SITE_OTHER): Payer: 59 | Admitting: Nurse Practitioner

## 2022-06-23 VITALS — BP 123/84 | HR 78 | Temp 98.6°F | Ht 63.0 in | Wt 244.0 lb

## 2022-06-23 DIAGNOSIS — Z7689 Persons encountering health services in other specified circumstances: Secondary | ICD-10-CM

## 2022-06-23 DIAGNOSIS — M79645 Pain in left finger(s): Secondary | ICD-10-CM

## 2022-06-23 DIAGNOSIS — I10 Essential (primary) hypertension: Secondary | ICD-10-CM

## 2022-06-23 DIAGNOSIS — E785 Hyperlipidemia, unspecified: Secondary | ICD-10-CM | POA: Diagnosis not present

## 2022-06-23 DIAGNOSIS — E1165 Type 2 diabetes mellitus with hyperglycemia: Secondary | ICD-10-CM | POA: Diagnosis not present

## 2022-06-23 DIAGNOSIS — J020 Streptococcal pharyngitis: Secondary | ICD-10-CM | POA: Diagnosis not present

## 2022-06-23 DIAGNOSIS — M19042 Primary osteoarthritis, left hand: Secondary | ICD-10-CM | POA: Diagnosis not present

## 2022-06-23 DIAGNOSIS — H9392 Unspecified disorder of left ear: Secondary | ICD-10-CM | POA: Diagnosis not present

## 2022-06-23 NOTE — Telephone Encounter (Signed)
Kathryn Merritt - I just called patient to update her on what is going on with the second PA for the Ozempic. She is asking what to do , as she is due for her injection tomorrow and does not have any?

## 2022-06-23 NOTE — Telephone Encounter (Signed)
I have talked with the patient. She states that she talked with a representative last night with [employer]. He told the patient that he was going to send over another PA, and id we could send all the medications that she had been on, that he may be able to get the Ozempic approved.  I had already faxed this over to our PA team with [place]. I have sent a email to Frederik Schmidt, giving her the additional inforamtion. Patient will be updated as we are .

## 2022-06-23 NOTE — Telephone Encounter (Signed)
Patient called and made aware.

## 2022-06-23 NOTE — Telephone Encounter (Signed)
Pt called back this morning stating that she contacted her insurance company and was told that lubiprostone is on her formulary. She is wanting to try that instead of the lactulose.

## 2022-06-23 NOTE — Progress Notes (Signed)
New Patient Note  RE: Kathryn Merritt MRN: [id number] DOB: 11-11-1960 Date of Office Visit: 06/23/2022  Chief Complaint: Establish Care, lesion ear, and Hand Pain  History of Present Illness: Patient is new to practice establishing care with history of hypertension, hyperlipidemia, and  uncontrolled diabetes. She presents today with left finger pain and right ear lesion. She denies fever, body ache or other complications.    Hand Pain  The incident occurred more than 1 week ago. The incident occurred at home. The injury mechanism was repetitive motion. The pain is present in the left hand and left fingers (thumb). The quality of the pain is described as aching. The pain does not radiate. The pain is at a severity of 7/10. The pain is severe. The pain has been Constant since the incident. Pertinent negatives include no numbness or tingling. The symptoms are aggravated by movement and lifting. She has tried acetaminophen for the symptoms. The treatment provided mild relief.     Assessment and Plan: Kenlei is a 61 y.o. female with: Establishing care with new doctor, encounter for Completed head to toe assessment.  Provided education to patient on health maintenance and preventative care.  Patient will follow-up every 3 months for chronic disease management.  Patient will return for complete physical and labs.  Lesion of left ear Referral completed to dermatology   For hand pain completed left hand x-ray results pending. Return in about 3 months (around 09/23/2022) for chronic disease mgt.   Diagnostics:   Past Medical History: Patient Active Problem List   Diagnosis Date Noted   Lesion of left ear 06/24/2022   Vitamin D deficiency 01/21/2022   Stage 3a chronic kidney disease ([place]) 01/21/2022   Esophageal dysphagia 01/13/2022   Urinary tract infection with hematuria 10/23/2021   Subclinical hypothyroidism 10/23/2021   Annual physical exam 09/23/2021   Bronchitis 09/09/2021    Gastroesophageal reflux disease 07/16/2021   OSA (obstructive sleep apnea) 07/14/2021   Establishing care with new doctor, encounter for 06/18/2021   Essential hypertension 06/18/2021   Hyperlipidemia 06/18/2021   Paroxysmal atrial fibrillation ([place]) 03/18/2021   Obesity, Class III, BMI 40-49.9 (morbid obesity) ([place]) 03/18/2021   Controlled type 2 diabetes mellitus  03/18/2021   Past Medical History:  Diagnosis Date   Chronic back pain    Diabetes mellitus    Edema leg    Hyperlipidemia    Hypertension    Joint pain    Kidney stones    Other malaise and fatigue    PAF (paroxysmal atrial fibrillation) ([place])    a. new diagnosis in 02/2021 --> s/p DCCV on 03/18/2021   RBBB    Sinus bradycardia    Sleep apnea    Past Surgical History: Past Surgical History:  Procedure Laterality Date   BALLOON DILATION N/A 02/22/2022   Procedure: BALLOON DILATION;  Surgeon: Eloise Harman, DO;  [facility];  Service: Endoscopy;  Laterality: N/A;   BIOPSY  02/22/2022   Procedure: BIOPSY;  Surgeon: Eloise Harman, DO;  [facility];  Service: Endoscopy;;   CARDIOVERSION N/A 03/18/2021   Procedure: CARDIOVERSION;  Surgeon: Satira Sark, MD;  Location[facility];  Service: Cardiovascular;  Laterality: N/A;   CESAREAN SECTION     times 2   COLONOSCOPY WITH PROPOFOL N/A 02/22/2022   Procedure: COLONOSCOPY WITH PROPOFOL;  Surgeon: Eloise Harman, DO;  [facility];  Service: Endoscopy;  Laterality: N/A;  7:30am   CORONARY ANGIOGRAPHY N/A 06/09/2022  Procedure: CORONARY ANGIOGRAPHY;  Surgeon: Early Osmond, MD;  Location: [place] [facility];  Service: Cardiovascular;  Laterality: N/A;   ESOPHAGOGASTRODUODENOSCOPY (EGD) WITH PROPOFOL N/A 02/22/2022   Procedure: ESOPHAGOGASTRODUODENOSCOPY (EGD) WITH PROPOFOL;  Surgeon: Eloise Harman, DO;  [facility];  Service: Endoscopy;  Laterality: N/A;   INCISION AND DRAINAGE ABSCESS Right    axilla    LITHOTRIPSY     POLYPECTOMY  02/22/2022   Procedure: POLYPECTOMY;  Surgeon: Eloise Harman, DO;  [facility];  Service: Endoscopy;;   Medication List:  Current Outpatient Medications  Medication Sig Dispense Refill   acetaminophen (TYLENOL) 500 MG tablet Take 1,000 mg by mouth every 8 (eight) hours as needed for moderate pain.     albuterol (VENTOLIN HFA) 108 (90 Base) MCG/ACT inhaler Inhale 2 puffs into the lungs every 6 (six) hours as needed for wheezing or shortness of breath. 8.5 g 0   amLODipine (NORVASC) 10 MG tablet TAKE ONE TABLET BY MOUTH DAILY 90 tablet 3   blood glucose meter kit and supplies Dispense based on patient and insurance preference. Use up to four times daily as directed. (FOR ICD-10 E10.9, E11.9). 1 each 0   diclofenac Sodium (VOLTAREN) 1 % GEL Apply 2 g topically 4 (four) times daily. 150 g 2   ELIQUIS 5 MG TABS tablet TAKE ONE TABLET BY MOUTH TWICE DAILY 60 tablet 5   GNP VITAMIN D 25 MCG (1000 UT) tablet Take 1 capsule (1,000 Units total) by mouth daily. (Patient taking differently: Take 1,000 Units by mouth daily.) 100 tablet 2   lactulose (CHRONULAC) 10 GM/15ML solution Take 30 mLs (20 g total) by mouth 2 (two) times daily. For constipation 1800 mL 3   lisinopril-hydrochlorothiazide (ZESTORETIC) 20-25 MG tablet Take 1 tablet by mouth daily. 60 tablet 2   metoprolol succinate (TOPROL-XL) 50 MG 24 hr tablet TAKE ONE TABLET BY MOUTH DAILY. TAKE WITH OR IMMEDIATELY FOLLOWING A MEAL. (Patient taking differently: Take 50 mg by mouth daily.) 90 tablet 3   omeprazole (PRILOSEC) 40 MG capsule Take 1 capsule (40 mg total) by mouth daily. 30 capsule 3   promethazine-dextromethorphan (PROMETHAZINE-DM) 6.25-15 MG/5ML syrup Take 5 mLs by mouth 4 (four) times daily as needed. 100 mL 0   rosuvastatin (CRESTOR) 10 MG tablet Take 10 mg by mouth daily.     Semaglutide, 2 MG/DOSE, (OZEMPIC, 2 MG/DOSE,) 8 MG/3ML SOPN Inject 2 mg into the skin once a week. Thurday     TRUE  METRIX BLOOD GLUCOSE TEST test strip SMARTSIG:Via Meter 1-4 Times Daily     TRUEplus Lancets 30G MISC USE UP TO FOUR TIMES DAILY AS DIRECTED     ENULOSE 10 GM/15ML SOLN Take by mouth.     lubiprostone (AMITIZA) 24 MCG capsule Take 1 capsule (24 mcg total) by mouth 2 (two) times daily with a meal. 60 capsule 5   No current facility-administered medications for this visit.   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  Tobacco Use   Smoking status: Never   Smokeless tobacco: Never  Vaping Use   Vaping Use: Never used  Substance and Sexual Activity   Alcohol use: No   Drug use:  No   Sexual activity: Not Currently  Other Topics Concern   Not on file  Social History Narrative   Live with her husband, works at [employer] center,    Social Determinants of [place] Strain: Not on file  Food Insecurity: No [place] (06/08/2022)   Hunger Vital Sign    Worried About Running Out of Food in the Last Year: Never true    Ran Out of Food in the Last Year: Never true  Transportation Needs: No Transportation Needs (06/08/2022)   PRAPARE - Hydrologist (Medical): No    Lack of Transportation (Non-Medical): No  Physical Activity: Not on file  Stress: Not on file  Social Connections: Not on file       Family History: Family History  Problem Relation Age of Onset   Hypertension Mother    Cancer Father    Hypertension Father    Cancer Brother    Heart disease Paternal Grandmother    Breast cancer Neg Hx    Colon cancer Neg Hx          Review of Systems  Constitutional: Negative.   HENT: Negative.    Eyes: Negative.   Respiratory: Negative.     Gastrointestinal: Negative.   Genitourinary: Negative.   Skin:        Lesion on left outer ear  Neurological:  Negative for tingling and numbness.  All other systems reviewed and are negative.  Objective: BP 123/84   Pulse 78   Temp 98.6 F (37 C)   Ht '5\' 3"'  (1.6 m)   Wt 244 lb (110.7 kg)   SpO2 97%   BMI 43.22 kg/m  Body mass index is 43.22 kg/m. Physical Exam Vitals and nursing note reviewed.  Constitutional:      Appearance: Normal appearance. She is obese.  HENT:     Head: Normocephalic.     Right Ear: Hearing and external ear normal. No swelling.     Left Ear: Hearing and external ear normal.     Ears:      Comments: Lesion left outer ear    Nose: Nose normal.     Mouth/Throat:     Mouth: Mucous membranes are moist.     Pharynx: Oropharynx is clear.  Eyes:     Conjunctiva/sclera: Conjunctivae normal.  Cardiovascular:     Rate and Rhythm: Normal rate and regular rhythm.     Pulses: Normal pulses.     Heart sounds: Normal heart sounds.  Pulmonary:     Effort: Pulmonary effort is normal.     Breath sounds: Normal breath sounds.  Abdominal:     General: Bowel sounds are normal.  Skin:    General: Skin is warm.     Findings: No erythema or rash.  Neurological:     General: No focal deficit present.     Mental Status: She is alert and oriented to person, place, and time.  Psychiatric:        Mood and Affect: Mood normal.        Behavior: Behavior normal.    The plan was reviewed with the patient/family, and all questions/concerned were addressed.  It was my pleasure to see Nickayla today and participate in her care. Please feel free to contact me with any questions or concerns.  Sincerely,  Jac Canavan NP [place]

## 2022-06-23 NOTE — Telephone Encounter (Signed)
Unfortunately I don't have a solution.  At this point we just have to sit and wait on insurance.

## 2022-06-23 NOTE — Patient Instructions (Signed)
Finger Sprain, Adult A finger sprain is a tear or stretch in a ligament in a finger. Ligaments are tissues that connect bones to each other. What are the causes? Finger sprains happen when something makes the bones in the hand move in an abnormal way. They are often caused by a fall or an accident. What increases the risk? This condition is more likely to develop in people who: Participate in sports in which it is easy to fall, such as skiing. Play sports that involve catching an object, such as basketball. Have poor strength and flexibility. What are the signs or symptoms? Symptoms of this condition include: Pain or tenderness in the finger. Swelling in the finger. A bluish appearance to the finger. Bruising. Difficulty bending and flexing the finger. How is this diagnosed? This condition is diagnosed with an exam of your finger. Your health care provider may take an X-ray to see if any bones are broken or dislocated. How is this treated? Treatment for this condition depends on how severe the sprain is. It may involve: Preventing the finger from moving for a period of time. Your finger may be wrapped in a bandage (dressing) or splint, or your finger may be taped to the fingers beside it (buddy taping). Medicines for pain. Exercises to strengthen the finger. These may be recommended when the finger has healed. Surgery to reconnect the ligament to a bone. This may be done if the ligament was completely torn. Follow these instructions at home: If you have a removable splint: Wear the splint as told by your health care provider. Remove it only as told by your health care provider. Check the skin around the splint every day. Tell your health care provider about any concerns. Loosen the splint if your fingers tingle, become numb, or turn cold and blue. Keep the splint clean. If the splint is not waterproof: Do not let it get wet. Cover it with a watertight covering when you take a bath or  shower. Managing pain, stiffness, and swelling If directed, put ice on the injured area. To do this: If you have a removable splint, remove it as told by your health care provider. Put ice in a plastic bag. Place a towel between your skin and the bag. Leave the ice on for 20 minutes, 2-3 times a day. Remove the ice if your skin turns bright red. This is very important. If you cannot feel pain, heat, or cold, you have a greater risk of damage to the area. Move your fingers often to reduce stiffness and swelling. Raise (elevate) the injured area above the level of your heart while you are sitting or lying down. Medicines Take over-the-counter and prescription medicines only as told by your health care provider. Ask your health care provider if the medicine prescribed to you requires you to avoid driving or using machinery. General instructions Keep any dressings dry until your health care provider says they can be removed. If your fingers are buddy taped, replace your buddy taping as told by your health care provider. Do exercises as told by your health care provider or physical therapist. Do not wear rings on your injured finger. Keep all follow-up visits. This is important. Contact a health care provider if: Your pain is not controlled with medicine. Your bruising or swelling gets worse. Your splint is damaged. You develop a fever. Get help right away if: Your finger is numb or blue. Your finger feels colder to the touch than normal. Summary A finger sprain   is a tear or stretch in a ligament in a finger. Ligaments are tissues that connect bones to each other. Finger sprains happen when something makes the bones in the hand move in an abnormal way. They are often caused by a fall or accident. This condition is diagnosed with an exam of your finger. Your health care provider may do an X-ray to see if any bones are broken or dislocated. Treatment for this condition depends on how severe  the sprain is. Treatment may involve buddy taping or wearing a splint. Surgery to reconnect the ligament to a bone may be needed if the ligament was torn all the way. This information is not intended to replace advice given to you by your health care provider. Make sure you discuss any questions you have with your health care provider. Document Revised: 07/30/2020 Document Reviewed: 07/30/2020 Elsevier Patient Education  2023 Elsevier Inc.  

## 2022-06-24 DIAGNOSIS — H9392 Unspecified disorder of left ear: Secondary | ICD-10-CM | POA: Insufficient documentation

## 2022-06-24 MED ORDER — LUBIPROSTONE 24 MCG PO CAPS: 24.0000 ug | ORAL_CAPSULE | Freq: Two times a day (BID) | ORAL | 5 refills | Status: DC

## 2022-06-24 NOTE — Assessment & Plan Note (Addendum)
Completed head to toe assessment.  Provided education to patient on health maintenance and preventative care.  Patient will follow-up every 3 months for chronic disease management.  Patient will return for complete physical and labs.

## 2022-06-24 NOTE — Telephone Encounter (Signed)
Rx for [employer] sent.

## 2022-06-24 NOTE — Addendum Note (Signed)
Addended by: Mahala Menghini on: 06/24/2022 03:04 PM   Modules accepted: Orders

## 2022-06-24 NOTE — Assessment & Plan Note (Signed)
Referral completed to dermatology

## 2022-06-25 ENCOUNTER — Telehealth: Payer: Self-pay | Admitting: Nurse Practitioner

## 2022-06-25 NOTE — Telephone Encounter (Signed)
Patient called to schedule appointment at dermatologist and they do not take her insurance.

## 2022-06-25 NOTE — Telephone Encounter (Signed)
PA for lubiprostone has been approved. Approval letter will be scanned in patient's chart.

## 2022-06-29 ENCOUNTER — Other Ambulatory Visit (HOSPITAL_COMMUNITY): Payer: Self-pay

## 2022-06-29 NOTE — Telephone Encounter (Signed)
Called for follow up of urgent appeal. They did receive it on 10/3, but as of yesterday 10/9, they determined that it didn't meet the criteria for an expedited appeal and will review it as a regular appeal. The rep I spoke with said typically the turn around is 2 weeks, but the estimated determination date is 11/2. If we haven't heard from them by then and need to follow up, the appeal phone # is [phone]

## 2022-06-30 NOTE — Telephone Encounter (Signed)
Talked with the patient.  She will try and change her lunch times tomorrow and come from between 1-2  tomorrow to get a sample of the Ozempic 1 mg.

## 2022-06-30 NOTE — Telephone Encounter (Signed)
Patient was called and given the message that was sent to me. She is asking , what is she to do in the mean time. She has not had the Ozempic in a while. She was injecting the Ozempic 2 mg weekly. She states that her sugars are messing up. Please advise , what she can do while we wait on the decision. Expected time is on or around July 22, 2022.

## 2022-06-30 NOTE — Telephone Encounter (Signed)
Kathryn Merritt is calling for an update on this?

## 2022-06-30 NOTE — Telephone Encounter (Signed)
We can provide her with a sample box of the 1 mg Ozempic which we currently have.  I know it isn't the 2 mg, but it is better than nothing.

## 2022-06-30 NOTE — Telephone Encounter (Signed)
I just seen a note from the Prior auth team. They submitted an appeal yesterday and were told it can take 2 weeks for a response from insurance.

## 2022-07-12 ENCOUNTER — Ambulatory Visit (INDEPENDENT_AMBULATORY_CARE_PROVIDER_SITE_OTHER): Payer: 59 | Admitting: Nurse Practitioner

## 2022-07-12 ENCOUNTER — Encounter: Payer: Self-pay | Admitting: Nurse Practitioner

## 2022-07-12 VITALS — BP 113/74 | HR 60 | Temp 98.6°F | Ht 63.0 in | Wt 237.0 lb

## 2022-07-12 DIAGNOSIS — R300 Dysuria: Secondary | ICD-10-CM

## 2022-07-12 LAB — URINALYSIS
Bilirubin, UA: NEGATIVE
Glucose, UA: NEGATIVE
Nitrite, UA: NEGATIVE
RBC, UA: NEGATIVE
Specific Gravity, UA: 1.02 (ref 1.005–1.030)
Urobilinogen, Ur: 0.2 mg/dL (ref 0.2–1.0)
pH, UA: 5.5 (ref 5.0–7.5)

## 2022-07-12 MED ORDER — CEPHALEXIN 500 MG PO CAPS: 500.0000 mg | ORAL_CAPSULE | Freq: Two times a day (BID) | ORAL | 0 refills | Status: DC

## 2022-07-12 MED ORDER — PHENAZOPYRIDINE HCL 95 MG PO TABS: 95.0000 mg | ORAL_TABLET | Freq: Three times a day (TID) | ORAL | 0 refills | Status: DC | PRN

## 2022-07-12 NOTE — Patient Instructions (Signed)

## 2022-07-12 NOTE — Progress Notes (Signed)
Acute Office Visit  Subjective:     Patient ID: Kathryn Merritt, female    DOB: 1961/04/12, 61 y.o.   MRN: [id number]  Chief Complaint  Patient presents with   Dysuria    Pt states she has noticed pressure , urine smell , cloudy    Back Pain    For about a week now    Dysuria  This is a new problem. Episode onset: in the past 3-4 days. The problem occurs intermittently. The problem has been unchanged. The quality of the pain is described as burning and aching. There has been no fever. Associated symptoms include chills and flank pain. She has tried nothing for the symptoms.  Back Pain This is a new problem. The current episode started yesterday. The problem is unchanged. The pain is present in the lumbar spine. The quality of the pain is described as aching. The pain is mild. The pain is The same all the time. Associated symptoms include dysuria. Pertinent negatives include no fever. Risk factors include obesity. She has tried nothing for the symptoms.     Review of Systems  Constitutional:  Positive for chills. Negative for fever and malaise/fatigue.  HENT: Negative.    Respiratory: Negative.    Genitourinary:  Positive for dysuria and flank pain.  Musculoskeletal:  Positive for back pain.  Skin: Negative.  Negative for rash.  All other systems reviewed and are negative.       Objective:    BP 113/74   Pulse 60   Temp 98.6 F (37 C)   Ht '5\' 3"'$  (1.6 m)   Wt 237 lb (107.5 kg)   SpO2 96%   BMI 41.98 kg/m  BP Readings from Last 3 Encounters:  07/12/22 113/74  06/23/22 123/84  06/16/22 110/78   Wt Readings from Last 3 Encounters:  07/12/22 237 lb (107.5 kg)  06/23/22 244 lb (110.7 kg)  06/16/22 240 lb (108.9 kg)      Physical Exam Vitals and nursing note reviewed.  Constitutional:      Appearance: Normal appearance.  HENT:     Head: Normocephalic.     Right Ear: External ear normal.     Left Ear: External ear normal.     Nose: Nose normal. No congestion.      Mouth/Throat:     Mouth: Mucous membranes are moist.     Pharynx: Oropharynx is clear.  Cardiovascular:     Rate and Rhythm: Normal rate.     Pulses: Normal pulses.     Heart sounds: Normal heart sounds.  Abdominal:     General: Bowel sounds are normal.     Tenderness: There is right CVA tenderness and left CVA tenderness.  Skin:    General: Skin is warm.     Findings: No erythema or rash.  Neurological:     Mental Status: She is alert and oriented to person, place, and time.     No results found for any visits on 07/12/22.      Assessment & Plan:  Patient presents with symptoms of dysuria with burning, back pain, chills and slight nausea. UTI  vs  interstitial cystitis -urinalysis completed results pending -pyridium for pain -Keflex positive leukocytes Precaution and education provided -All questions answered -follow up with unresolved symptoms  Problem List Items Addressed This Visit   None Visit Diagnoses     Dysuria    -  Primary   Relevant Medications   cephALEXin (KEFLEX) 500 MG capsule  phenazopyridine (PYRIDIUM) 95 MG tablet   Other Relevant Orders   Urinalysis   Urine Culture       Meds ordered this encounter  Medications   cephALEXin (KEFLEX) 500 MG capsule    Sig: Take 1 capsule (500 mg total) by mouth 2 (two) times daily.    Dispense:  14 capsule    Refill:  0    Order Specific Question:   Supervising Provider    Answer:   Jeneen Rinks   phenazopyridine (PYRIDIUM) 95 MG tablet    Sig: Take 1 tablet (95 mg total) by mouth 3 (three) times daily as needed for pain.    Dispense:  10 tablet    Refill:  0    Order Specific Question:   Supervising Provider    Answer:   Claretta Fraise [phone]    Return if symptoms worsen or fail to improve.  Ivy Lynn, NP

## 2022-07-16 ENCOUNTER — Other Ambulatory Visit: Payer: Self-pay | Admitting: Nurse Practitioner

## 2022-07-16 DIAGNOSIS — R8279 Other abnormal findings on microbiological examination of urine: Secondary | ICD-10-CM

## 2022-07-16 LAB — URINE CULTURE

## 2022-07-16 MED ORDER — AMOXICILLIN-POT CLAVULANATE 875-125 MG PO TABS: 1.0000 | ORAL_TABLET | Freq: Two times a day (BID) | ORAL | 0 refills | Status: DC

## 2022-07-19 ENCOUNTER — Other Ambulatory Visit: Payer: Self-pay | Admitting: Nurse Practitioner

## 2022-07-19 ENCOUNTER — Encounter (HOSPITAL_COMMUNITY): Payer: Self-pay | Admitting: Emergency Medicine

## 2022-07-19 ENCOUNTER — Emergency Department (HOSPITAL_COMMUNITY)
Admission: EM | Admit: 2022-07-19 | Discharge: 2022-07-19 | Disposition: A | Discharge status: Home / Self Care | Payer: 59 | Attending: Emergency Medicine | Admitting: Emergency Medicine

## 2022-07-19 ENCOUNTER — Emergency Department (HOSPITAL_COMMUNITY): Payer: 59

## 2022-07-19 DIAGNOSIS — F419 Anxiety disorder, unspecified: Secondary | ICD-10-CM | POA: Diagnosis not present

## 2022-07-19 DIAGNOSIS — K219 Gastro-esophageal reflux disease without esophagitis: Secondary | ICD-10-CM

## 2022-07-19 DIAGNOSIS — Z7901 Long term (current) use of anticoagulants: Secondary | ICD-10-CM | POA: Insufficient documentation

## 2022-07-19 DIAGNOSIS — I10 Essential (primary) hypertension: Secondary | ICD-10-CM | POA: Diagnosis not present

## 2022-07-19 DIAGNOSIS — Z79899 Other long term (current) drug therapy: Secondary | ICD-10-CM | POA: Insufficient documentation

## 2022-07-19 DIAGNOSIS — E119 Type 2 diabetes mellitus without complications: Secondary | ICD-10-CM | POA: Insufficient documentation

## 2022-07-19 DIAGNOSIS — R0602 Shortness of breath: Secondary | ICD-10-CM | POA: Diagnosis not present

## 2022-07-19 DIAGNOSIS — R69 Illness, unspecified: Secondary | ICD-10-CM | POA: Diagnosis not present

## 2022-07-19 LAB — CBC WITH DIFFERENTIAL/PLATELET
Abs Immature Granulocytes: 0.01 10*3/uL (ref 0.00–0.07)
Basophils Absolute: 0.1 10*3/uL (ref 0.0–0.1)
Basophils Relative: 1 %
Eosinophils Absolute: 0.2 10*3/uL (ref 0.0–0.5)
Eosinophils Relative: 3 %
HCT: 40.2 % (ref 36.0–46.0)
Hemoglobin: 13.5 g/dL (ref 12.0–15.0)
Immature Granulocytes: 0 %
Lymphocytes Relative: 41 %
Lymphs Abs: 2.7 10*3/uL (ref 0.7–4.0)
MCH: 29.6 pg (ref 26.0–34.0)
MCHC: 33.6 g/dL (ref 30.0–36.0)
MCV: 88.2 fL (ref 80.0–100.0)
Monocytes Absolute: 0.5 10*3/uL (ref 0.1–1.0)
Monocytes Relative: 7 %
Neutro Abs: 3.2 10*3/uL (ref 1.7–7.7)
Neutrophils Relative %: 48 %
Platelets: 234 10*3/uL (ref 150–400)
RBC: 4.56 MIL/uL (ref 3.87–5.11)
RDW: 14.4 % (ref 11.5–15.5)
WBC: 6.6 10*3/uL (ref 4.0–10.5)
nRBC: 0 % (ref 0.0–0.2)

## 2022-07-19 LAB — D-DIMER, QUANTITATIVE: D-Dimer, Quant: <0.27 ug/mL-FEU (ref 0.00–0.50)

## 2022-07-19 LAB — COMPREHENSIVE METABOLIC PANEL
ALT: 14 U/L (ref 0–44)
AST: 18 U/L (ref 15–41)
Albumin: 3.8 g/dL (ref 3.5–5.0)
Alkaline Phosphatase: 62 U/L (ref 38–126)
Anion gap: 7 (ref 5–15)
BUN: 25 mg/dL — ABNORMAL HIGH (ref 6–20)
CO2: 24 mmol/L (ref 22–32)
Calcium: 10.1 mg/dL (ref 8.9–10.3)
Chloride: 109 mmol/L (ref 98–111)
Creatinine, Ser: 1.06 mg/dL — ABNORMAL HIGH (ref 0.44–1.00)
GFR, Estimated: >60 mL/min (ref >60)
Glucose, Bld: 178 mg/dL — ABNORMAL HIGH (ref 70–99)
Potassium: 3.2 mmol/L — ABNORMAL LOW (ref 3.5–5.1)
Sodium: 140 mmol/L (ref 135–145)
Total Bilirubin: 0.5 mg/dL (ref 0.3–1.2)
Total Protein: 7.2 g/dL (ref 6.5–8.1)

## 2022-07-19 LAB — TROPONIN I (HIGH SENSITIVITY)
Troponin I (High Sensitivity): 7 ng/L (ref <18)
Troponin I (High Sensitivity): 7 ng/L (ref <18)

## 2022-07-19 LAB — BRAIN NATRIURETIC PEPTIDE: B Natriuretic Peptide: 25 pg/mL (ref 0.0–100.0)

## 2022-07-19 MED ORDER — ACETAMINOPHEN 500 MG PO TABS
1000.0000 mg | ORAL_TABLET | Freq: Once | ORAL | Status: AC
  Administered 2022-07-19: 1000 mg via ORAL
  Filled 2022-07-19: qty 2

## 2022-07-19 MED ORDER — POTASSIUM CHLORIDE CRYS ER 20 MEQ PO TBCR
40.0000 meq | EXTENDED_RELEASE_TABLET | Freq: Once | ORAL | Status: AC
  Administered 2022-07-19: 40 meq via ORAL
  Filled 2022-07-19: qty 2

## 2022-07-19 MED ORDER — LORAZEPAM 0.5 MG PO TABS
0.5000 mg | ORAL_TABLET | Freq: Once | ORAL | Status: AC
  Administered 2022-07-19: 0.5 mg via ORAL
  Filled 2022-07-19: qty 1

## 2022-07-19 NOTE — ED Notes (Signed)
Pt very anxious 

## 2022-07-19 NOTE — ED Provider Notes (Signed)
[facility] EMERGENCY DEPARTMENT Provider Note   CSN: [id number] Arrival date & time: 07/19/22  0636     History  Chief Complaint  Patient presents with   Shortness of Breath    Kathryn Merritt is a 61 y.o. female.  61 year old female with a history of atrial fibrillation on metoprolol and Eliquis, OSA, HTN, HLD, DM 2, and chronic right bundle branch block who presents emergency department with shortness of breath.  Patient states that Kathryn Merritt was in her usual state of health last night when Kathryn Merritt went to sleep.  Says Kathryn Merritt woke up this morning and started feeling short of breath while Kathryn Merritt was showering.  Denies any chest pain or discomfort.  Feels nauseous but no vomiting.  No diaphoresis.  Denies any cough, congestion, fever recently.  No lower extremity swelling or history of DVT/PE.  Says that Kathryn Merritt has been compliant with her Eliquis and metoprolol.  Reports that Kathryn Merritt does have a history of anxiety but has never been diagnosed with panic attacks that Kathryn Merritt is feeling anxious at this time.  Of note did have a catheterization in September and this year that showed right dominant coronary vasculature without evidence of MI or dissection.  Reports mild headache at this time.   Past Medical History:  Diagnosis Date   Chronic back pain    Diabetes mellitus    Edema leg    Hyperlipidemia    Hypertension    Joint pain    Kidney stones    Other malaise and fatigue    PAF (paroxysmal atrial fibrillation) ([place])    a. new diagnosis in 02/2021 --> s/p DCCV on 03/18/2021   RBBB    Sinus bradycardia    Sleep apnea       Home Medications Prior to Admission medications   Medication Sig Start Date End Date Taking? Authorizing Provider  acetaminophen (TYLENOL) 500 MG tablet Take 1,000 mg by mouth every 8 (eight) hours as needed for moderate pain.    [provider]  albuterol (VENTOLIN HFA) 108 (90 Base) MCG/ACT inhaler Inhale 2 puffs into the lungs every 6 (six) hours as needed for wheezing  or shortness of breath. 11/20/21   Renee Rival, FNP  amLODipine (NORVASC) 10 MG tablet TAKE ONE TABLET BY MOUTH DAILY 04/15/22   Josue Hector, MD  amoxicillin-clavulanate (AUGMENTIN) 875-125 MG tablet Take 1 tablet by mouth 2 (two) times daily. 07/16/22   Ivy Lynn, NP  blood glucose meter kit and supplies Dispense based on patient and insurance preference. Use up to four times daily as directed. (FOR ICD-10 E10.9, E11.9). 10/01/21   Brita Romp, NP  diclofenac Sodium (VOLTAREN) 1 % GEL Apply 2 g topically 4 (four) times daily. 12/26/21   Volney American, PA-C  ELIQUIS 5 MG TABS tablet TAKE ONE TABLET BY MOUTH TWICE DAILY 04/26/22   Minus Breeding, MD  ENULOSE 10 GM/15ML SOLN Take by mouth. 06/22/22   [provider]  GNP VITAMIN D 25 MCG (1000 UT) tablet Take 1 capsule (1,000 Units total) by mouth daily. Patient taking differently: Take 1,000 Units by mouth daily. 04/15/22   Paseda, Dewaine Conger, FNP  lactulose (CHRONULAC) 10 GM/15ML solution Take 30 mLs (20 g total) by mouth 2 (two) times daily. For constipation 06/22/22   Mahala Menghini, PA-C  lisinopril-hydrochlorothiazide (ZESTORETIC) 20-25 MG tablet Take 1 tablet by mouth daily. 12/24/21   Renee Rival, FNP  lubiprostone (AMITIZA) 24 MCG capsule Take 1 capsule (24 mcg  total) by mouth 2 (two) times daily with a meal. 06/24/22   Mahala Menghini, PA-C  metoprolol succinate (TOPROL-XL) 50 MG 24 hr tablet TAKE ONE TABLET BY MOUTH DAILY. TAKE WITH OR IMMEDIATELY FOLLOWING A MEAL. Patient taking differently: Take 50 mg by mouth daily. 11/05/21   Minus Breeding, MD  omeprazole (PRILOSEC) 40 MG capsule Take 1 capsule (40 mg total) by mouth daily. 02/22/22   Renee Rival, FNP  phenazopyridine (PYRIDIUM) 95 MG tablet Take 1 tablet (95 mg total) by mouth 3 (three) times daily as needed for pain. 07/12/22   Ivy Lynn, NP  promethazine-dextromethorphan (PROMETHAZINE-DM) 6.25-15 MG/5ML syrup Take 5 mLs by mouth 4  (four) times daily as needed. 03/20/22   Volney American, PA-C  rosuvastatin (CRESTOR) 10 MG tablet Take 10 mg by mouth daily. 05/10/22   [provider]  Semaglutide, 2 MG/DOSE, (OZEMPIC, 2 MG/DOSE,) 8 MG/3ML SOPN Inject 2 mg into the skin once a week. Thurday    [provider]  TRUE METRIX BLOOD GLUCOSE TEST test strip SMARTSIG:Via Meter 1-4 Times Daily 10/01/21   [provider]  TRUEplus Lancets 30G MISC USE UP TO FOUR TIMES DAILY AS DIRECTED 10/01/21   [provider]      Allergies    Patient has no known allergies.    Review of Systems   Review of Systems  Physical Exam Updated Vital Signs BP 109/63   Pulse (!) 57   Temp 97.6 F (36.4 C) (Oral)   Resp 12   Ht _0  (1.6 m)   Wt 107.5 kg   SpO2 99%   BMI 41.98 kg/m  Physical Exam Vitals and nursing note reviewed.  Constitutional:      General: Kathryn Merritt is not in acute distress.    Appearance: Kathryn Merritt is well-developed.  HENT:     Head: Normocephalic and atraumatic.     Right Ear: External ear normal.     Left Ear: External ear normal.     Nose: Nose normal.  Eyes:     Extraocular Movements: Extraocular movements intact.     Conjunctiva/sclera: Conjunctivae normal.     Pupils: Pupils are equal, round, and reactive to light.  Cardiovascular:     Rate and Rhythm: Normal rate and regular rhythm.     Heart sounds: No murmur heard. Pulmonary:     Effort: Pulmonary effort is normal. No respiratory distress.     Breath sounds: Normal breath sounds.  Musculoskeletal:        General: No swelling.     Cervical back: Normal range of motion and neck supple.     Right lower leg: No edema.     Left lower leg: No edema.  Skin:    General: Skin is warm and dry.     Capillary Refill: Capillary refill takes less than 2 seconds.  Neurological:     Mental Status: Kathryn Merritt is alert and oriented to person, place, and time. Mental status is at baseline.  Psychiatric:     Comments: Mildly anxious  appearing     ED Results / Procedures / Treatments   Labs (all labs ordered are listed, but only abnormal results are displayed) Labs Reviewed  COMPREHENSIVE METABOLIC PANEL - Abnormal; Notable for the following components:      Result Value   Potassium 3.2 (*)    Glucose, Bld 178 (*)    BUN 25 (*)    Creatinine, Ser 1.06 (*)    All other components within  normal limits  CBC WITH DIFFERENTIAL/PLATELET  BRAIN NATRIURETIC PEPTIDE  D-DIMER, QUANTITATIVE  TROPONIN I (HIGH SENSITIVITY)  TROPONIN I (HIGH SENSITIVITY)    EKG EKG Interpretation  Date/Time:  Monday July 19 2022 08:35:09 EDT Ventricular Rate:  59 PR Interval:  134 QRS Duration: 148 QT Interval:  439 QTC Calculation: 435 R Axis:   44 Text Interpretation: Sinus rhythm Right bundle branch block No significant change since last tracing Confirmed by Margaretmary Eddy [phone]) on 07/19/2022 8:59:19 AM  Radiology DG Chest 2 View  Result Date: 07/19/2022 CLINICAL DATA:  Sudden onset shortness of breath EXAM: CHEST - 2 VIEW COMPARISON:  None Available. FINDINGS: Normal mediastinum and cardiac silhouette. Normal pulmonary vasculature. No evidence of effusion, infiltrate, or pneumothorax. No acute bony abnormality. IMPRESSION: No acute cardiopulmonary process. Electronically Signed   By: Suzy Bouchard M.D.   On: 07/19/2022 08:06    Procedures Procedures   Medications Ordered in ED Medications  LORazepam (ATIVAN) tablet 0.5 mg (0.5 mg Oral Given 07/19/22 0722)  acetaminophen (TYLENOL) tablet 1,000 mg (1,000 mg Oral Given 07/19/22 0722)  potassium chloride SA (KLOR-CON M) CR tablet 40 mEq (40 mEq Oral Given 07/19/22 0845)    ED Course/ Medical Decision Making/ A&P Clinical Course as of 07/19/22 0939  Mon Jul 19, 2022  0719 Chest x-ray reviewed and interpreted by me as showing no acute abnormality. [RP]  0924 Creatinine(!): 1.06 At baseline. [RP]    Clinical Course User Index [RP] Fransico Meadow, MD                            Medical Decision Making Amount and/or Complexity of Data Reviewed Labs:  Decision-making details documented in ED Course.  Risk OTC drugs. Prescription drug management.   TAHEERA THOMANN is a 61 y.o. female with comorbidities that complicate the patient evaluation including hypertension, hyperlipidemia, paroxysmal atrial fibrillation on Eliquis and metoprolol, DM 2, and chronic right bundle branch block who presents with chief complaint of shortness of breath.  This patient presents to the ED for concern of complaints listed in HPI, this involves an extensive number of treatment options, and is a complaint that carries with it a high risk of complications and morbidity. Disposition including potential need for admission considered.   Initial Ddx:  MI, PE, pneumonia, pneumothorax, anemia, anxiety  MDM:  Given the patient's recent heart catheterization and stress test that were negative feel that MI is highly unlikely.  Patient has been compliant with her Eliquis and does not have any recent surgeries with only risk factor for PE being age, habitus, and nonmetastatic skin cancer will obtain D-dimer.  Feel that pneumonia is unlikely given her lack of infectious symptoms.  Also feel that spontaneous pneumothorax is highly unlikely.  Will evaluate for anemia but feel that the acute onset is not consistent with this condition.  If her medical work-up is unremarkable may be due to anxiety.  Plan:  Labs EKG Chest x-ray Troponin D-dimer  ED Summary/Re-evaluation:  Patient was reevaluated and was stable.  D-dimer was undetectably low and troponin was stable at 7.  Given her most recent heart catheterization feel that cardiac cause is highly unlikely.  Chest x-ray did not show evidence of pneumonia or pneumothorax.  Patient was not anemic.  Unclear what is causing her pain at this time but may be related to her anxiety.  Feel that Kathryn Merritt is safe for outpatient work-up at this time and  will  have her follow-up with her PCP for additional evaluation.  Dispo: DC Home. Return precautions discussed including, but not limited to, those listed in the AVS. Allowed pt time to ask questions which were answered fully prior to dc.   Records reviewed Outpatient Clinic Notes The following labs were independently interpreted: Chemistry and Serial Troponins and show CKD I independently reviewed the following imaging with scope of interpretation limited to determining acute life threatening conditions related to emergency care: Chest x-ray, which revealed no acute abnormality  I personally reviewed and interpreted cardiac monitoring: normal sinus rhythm  I personally reviewed and interpreted the pt's EKG: see above for interpretation  I have reviewed the patients home medications and made adjustments as needed  Final Clinical Impression(s) / ED Diagnoses Final diagnoses:  Shortness of breath    Rx / DC Orders ED Discharge Orders     None         Fransico Meadow, MD 07/19/22 [phone]

## 2022-07-19 NOTE — ED Triage Notes (Signed)
Pt arrives POV c/o sudden SOB while she was getting ready for work this morning. Pt also c/o feeling very shaky this morning. Pt recently had cardic cath last month.

## 2022-07-19 NOTE — Discharge Instructions (Signed)
Today you were seen in the emergency department for your shortness of breath.    In the emergency department you had lab work, EKG, and a chest x-ray that were reassuring.  At this time it is safe for you to follow-up with your primary doctor to continue your work-up.  Follow-up with your primary doctor in 2-3 days regarding your visit.    Return immediately to the emergency department if you experience any of the following: Difficulty breathing, chest pain, or any other concerning symptoms.    Thank you for visiting our Emergency Department. It was a pleasure taking care of you today.

## 2022-07-21 ENCOUNTER — Telehealth: Payer: Self-pay | Admitting: Nurse Practitioner

## 2022-07-21 NOTE — Telephone Encounter (Signed)
We never heard back formally from insurance about the appeal we did.  Therefore, we don't know the official reasoning behind their denial.  I will route to the PA team and see what they say.

## 2022-07-21 NOTE — Telephone Encounter (Signed)
Patient said she is not approved for Ozempic. Her insurance seems to think they do not have all the paperwork they need. What do you want her to do?

## 2022-07-22 ENCOUNTER — Other Ambulatory Visit (HOSPITAL_COMMUNITY): Payer: Self-pay

## 2022-07-22 ENCOUNTER — Other Ambulatory Visit: Payer: Self-pay | Admitting: Nurse Practitioner

## 2022-07-22 DIAGNOSIS — C44219 Basal cell carcinoma of skin of left ear and external auricular canal: Secondary | ICD-10-CM | POA: Diagnosis not present

## 2022-07-22 MED ORDER — TRULICITY 4.5 MG/0.5ML ~~LOC~~ SOAJ: 4.5000 mg | SUBCUTANEOUS | 3 refills | Status: DC

## 2022-07-22 NOTE — Telephone Encounter (Signed)
Pharmacy Patient Advocate Encounter  [facility] again for follow up on this appeal. A letter was generated yesterday to say the denial was upheld due to the pt not trying and failing the 2 alternative medications or intolerance/contraindication not being given as to why they pt can't take these 2 medications. (Victoza & Trulicity)  I did a test claim for both and they still require step therapy/PA. Likely to show they have taken Metformin, but it didn't say that.   We did use your reasoning as to why ozempic is preferred over victoza and trulicity and stated that it was a continuation of therapy, but none of that sufficed.  Would you like to change the pt to one of these, or do an additional appeal stating the clinical reasoning as to why the pt can't take these medications, vs why we want Ozempic over these 2?  It sounds like it's in the wording, but I can't confirm it would be approved.

## 2022-07-22 NOTE — Progress Notes (Signed)
Insurance denied appeal for Ozempic stating we needed to try preferred medication like Trulicity first.  Will send in for Trulicity 4.5 mg SQ weekly as she has been on the highest '2mg'$  dose of Ozempic.

## 2022-07-23 ENCOUNTER — Telehealth: Payer: Self-pay

## 2022-07-23 ENCOUNTER — Other Ambulatory Visit (HOSPITAL_COMMUNITY): Payer: Self-pay

## 2022-07-23 NOTE — Telephone Encounter (Signed)
Pharmacy Patient Advocate Encounter   Received notification that prior authorization for Trulicity 4.'5MG'$ /0.5ML pen-injectors is required/requested.   PA submitted on 07/23/2022 to [place] via CoverMyMeds Key [id number]  Status is pending

## 2022-07-23 NOTE — Telephone Encounter (Signed)
Patient Advocate Encounter  Prior Authorization for Trulicity 4.'5MG'$ /0.5ML pen-injectors has been approved.    PA# [id number] Key: [id number]  Effective dates: 07/23/2022 through 07/24/2023

## 2022-07-29 ENCOUNTER — Encounter: Payer: Self-pay | Admitting: Internal Medicine

## 2022-08-18 ENCOUNTER — Ambulatory Visit: Payer: 59 | Admitting: Nurse Practitioner

## 2022-08-23 ENCOUNTER — Other Ambulatory Visit: Payer: Self-pay | Admitting: Nurse Practitioner

## 2022-09-24 ENCOUNTER — Encounter: Payer: Self-pay | Admitting: Nurse Practitioner

## 2022-09-24 ENCOUNTER — Ambulatory Visit (INDEPENDENT_AMBULATORY_CARE_PROVIDER_SITE_OTHER): Payer: Commercial Managed Care - HMO | Admitting: Nurse Practitioner

## 2022-09-24 VITALS — BP 136/76 | HR 58 | Temp 98.7°F | Ht 63.0 in | Wt 239.0 lb

## 2022-09-24 DIAGNOSIS — I10 Essential (primary) hypertension: Secondary | ICD-10-CM

## 2022-09-24 DIAGNOSIS — K5900 Constipation, unspecified: Secondary | ICD-10-CM

## 2022-09-24 DIAGNOSIS — E119 Type 2 diabetes mellitus without complications: Secondary | ICD-10-CM | POA: Diagnosis not present

## 2022-09-24 DIAGNOSIS — J40 Bronchitis, not specified as acute or chronic: Secondary | ICD-10-CM

## 2022-09-24 DIAGNOSIS — E0800 Diabetes mellitus due to underlying condition with hyperosmolarity without nonketotic hyperglycemic-hyperosmolar coma (NKHHC): Secondary | ICD-10-CM

## 2022-09-24 LAB — BAYER DCA HB A1C WAIVED: HB A1C (BAYER DCA - WAIVED): 7.1 % — ABNORMAL HIGH (ref 4.8–5.6)

## 2022-09-24 MED ORDER — ALBUTEROL SULFATE HFA 108 (90 BASE) MCG/ACT IN AERS: 2.0000 | INHALATION_SPRAY | Freq: Four times a day (QID) | RESPIRATORY_TRACT | 0 refills | Status: DC | PRN

## 2022-09-24 MED ORDER — LUBIPROSTONE 24 MCG PO CAPS: 24.0000 ug | ORAL_CAPSULE | Freq: Two times a day (BID) | ORAL | 0 refills | Status: DC

## 2022-09-24 NOTE — Assessment & Plan Note (Signed)
Hypertension well controlled on current medication no changes needed

## 2022-09-24 NOTE — Patient Instructions (Addendum)
Hypertension, Adult High blood pressure (hypertension) is when the force of blood pumping through the arteries is too strong. The arteries are the blood vessels that carry blood from the heart throughout the body. Hypertension forces the heart to work harder to pump blood and may cause arteries to become narrow or stiff. Untreated or uncontrolled hypertension can lead to a heart attack, heart failure, a stroke, kidney disease, and other problems. A blood pressure reading consists of a higher number over a lower number. Ideally, your blood pressure should be below 120/80. The first ("top") number is called the systolic pressure. It is a measure of the pressure in your arteries as your heart beats. The second ("bottom") number is called the diastolic pressure. It is a measure of the pressure in your arteries as the heart relaxes. What are the causes? The exact cause of this condition is not known. There are some conditions that result in high blood pressure. What increases the risk? Certain factors may make you more likely to develop high blood pressure. Some of these risk factors are under your control, including: Smoking. Not getting enough exercise or physical activity. Being overweight. Having too much fat, sugar, calories, or salt (sodium) in your diet. Drinking too much alcohol. Other risk factors include: Having a personal history of heart disease, diabetes, high cholesterol, or kidney disease. Stress. Having a family history of high blood pressure and high cholesterol. Having obstructive sleep apnea. Age. The risk increases with age. What are the signs or symptoms? High blood pressure may not cause symptoms. Very high blood pressure (hypertensive crisis) may cause: Headache. Fast or irregular heartbeats (palpitations). Shortness of breath. Nosebleed. Nausea and vomiting. Vision changes. Severe chest pain, dizziness, and seizures. How is this diagnosed? This condition is diagnosed by  measuring your blood pressure while you are seated, with your arm resting on a flat surface, your legs uncrossed, and your feet flat on the floor. The cuff of the blood pressure monitor will be placed directly against the skin of your upper arm at the level of your heart. Blood pressure should be measured at least twice using the same arm. Certain conditions can cause a difference in blood pressure between your right and left arms. If you have a high blood pressure reading during one visit or you have normal blood pressure with other risk factors, you may be asked to: Return on a different day to have your blood pressure checked again. Monitor your blood pressure at home for 1 week or longer. If you are diagnosed with hypertension, you may have other blood or imaging tests to help your health care provider understand your overall risk for other conditions. How is this treated? This condition is treated by making healthy lifestyle changes, such as eating healthy foods, exercising more, and reducing your alcohol intake. You may be referred for counseling on a healthy diet and physical activity. Your health care provider may prescribe medicine if lifestyle changes are not enough to get your blood pressure under control and if: Your systolic blood pressure is above 130. Your diastolic blood pressure is above 80. Your personal target blood pressure may vary depending on your medical conditions, your age, and other factors. Follow these instructions at home: Eating and drinking  Eat a diet that is high in fiber and potassium, and low in sodium, added sugar, and fat. An example of this eating plan is called the DASH diet. DASH stands for Dietary Approaches to Stop Hypertension. To eat this way: Eat   plenty of fresh fruits and vegetables. Try to fill one half of your plate at each meal with fruits and vegetables. Eat whole grains, such as whole-wheat pasta, brown rice, or whole-grain bread. Fill about one  fourth of your plate with whole grains. Eat or drink low-fat dairy products, such as skim milk or low-fat yogurt. Avoid fatty cuts of meat, processed or cured meats, and poultry with skin. Fill about one fourth of your plate with lean proteins, such as fish, chicken without skin, beans, eggs, or tofu. Avoid pre-made and processed foods. These tend to be higher in sodium, added sugar, and fat. Reduce your daily sodium intake. Many people with hypertension should eat less than 1,500 mg of sodium a day. Do not drink alcohol if: Your health care provider tells you not to drink. You are pregnant, may be pregnant, or are planning to become pregnant. If you drink alcohol: Limit how much you have to: 0-1 drink a day for women. 0-2 drinks a day for men. Know how much alcohol is in your drink. In the [place] one drink equals one 12 oz bottle of beer (355 mL), one 5 oz glass of wine (148 mL), or one 1 oz glass of hard liquor (44 mL). Lifestyle  Work with your health care provider to maintain a healthy body weight or to lose weight. Ask what an ideal weight is for you. Get at least 30 minutes of exercise that causes your heart to beat faster (aerobic exercise) most days of the week. Activities may include walking, swimming, or biking. Include exercise to strengthen your muscles (resistance exercise), such as Pilates or lifting weights, as part of your weekly exercise routine. Try to do these types of exercises for 30 minutes at least 3 days a week. Do not use any products that contain nicotine or tobacco. These products include cigarettes, chewing tobacco, and vaping devices, such as e-cigarettes. If you need help quitting, ask your health care provider. Monitor your blood pressure at home as told by your health care provider. Keep all follow-up visits. This is important. Medicines Take over-the-counter and prescription medicines only as told by your health care provider. Follow directions carefully. Blood  pressure medicines must be taken as prescribed. Do not skip doses of blood pressure medicine. Doing this puts you at risk for problems and can make the medicine less effective. Ask your health care provider about side effects or reactions to medicines that you should watch for. Contact a health care provider if you: Think you are having a reaction to a medicine you are taking. Have headaches that keep coming back (recurring). Feel dizzy. Have swelling in your ankles. Have trouble with your vision. Get help right away if you: Develop a severe headache or confusion. Have unusual weakness or numbness. Feel faint. Have severe pain in your chest or abdomen. Vomit repeatedly. Have trouble breathing. These symptoms may be an emergency. Get help right away. Call 911. Do not wait to see if the symptoms will go away. Do not drive yourself to the hospital. Summary Hypertension is when the force of blood pumping through your arteries is too strong. If this condition is not controlled, it may put you at risk for serious complications. Your personal target blood pressure may vary depending on your medical conditions, your age, and other factors. For most people, a normal blood pressure is less than 120/80. Hypertension is treated with lifestyle changes, medicines, or a combination of both. Lifestyle changes include losing weight, eating a healthy,   low-sodium diet, exercising more, and limiting alcohol. This information is not intended to replace advice given to you by your health care provider. Make sure you discuss any questions you have with your health care provider. Document Revised: 07/14/2021 Document Reviewed: 07/14/2021 Elsevier Patient Education  2023 [employer] Inc.  

## 2022-09-24 NOTE — Assessment & Plan Note (Signed)
Completed labs: A1c 7.1 patients goal is  less than 7. She is managed by endocrinology. Continue low sodium diet, weight loss and exercise.

## 2022-09-24 NOTE — Progress Notes (Signed)
Established patient visit  Subjective:     Patient ID: Kathryn Merritt, female    DOB: 1961-01-21, 62 y.o.   MRN: [id number]  Chief Complaint  Patient presents with   Hypertension   Diabetes    HPI Patient is in today for   Review of Systems  Constitutional: Negative.  Negative for chills and fever.  HENT: Negative.    Respiratory: Negative.    Cardiovascular: Negative.   Gastrointestinal: Negative.   Genitourinary: Negative.   Skin: Negative.  Negative for itching and rash.  All other systems reviewed and are negative.       Objective:    BP 136/76   Pulse (!) 58   Temp 98.7 F (37.1 C)   Ht '5\' 3"'$  (1.6 m)   Wt 239 lb (108.4 kg)   SpO2 97%   BMI 42.34 kg/m  BP Readings from Last 3 Encounters:  09/24/22 136/76  07/19/22 108/60  07/12/22 113/74   Wt Readings from Last 3 Encounters:  09/24/22 239 lb (108.4 kg)  07/19/22 237 lb (107.5 kg)  07/12/22 237 lb (107.5 kg)      Physical Exam Vitals and nursing note reviewed.  Constitutional:      Appearance: Normal appearance. She is obese.  HENT:     Head: Normocephalic.     Right Ear: External ear normal.     Left Ear: External ear normal.     Nose: Nose normal.     Mouth/Throat:     Mouth: Mucous membranes are moist.     Pharynx: Oropharynx is clear.  Eyes:     Conjunctiva/sclera: Conjunctivae normal.  Cardiovascular:     Rate and Rhythm: Normal rate and regular rhythm.     Pulses: Normal pulses.     Heart sounds: Normal heart sounds.  Pulmonary:     Effort: Pulmonary effort is normal.     Breath sounds: Normal breath sounds.  Abdominal:     General: Bowel sounds are normal.  Neurological:     General: No focal deficit present.     Mental Status: She is alert and oriented to person, place, and time.  Psychiatric:        Mood and Affect: Mood normal.        Behavior: Behavior normal.     No results found for any visits on 09/24/22.      Assessment & Plan:   Problem List Items Addressed  This Visit       Cardiovascular and Mediastinum   Hypertension    Hypertension well controlled on current medication no changes needed        Respiratory   Bronchitis   Relevant Medications   albuterol (VENTOLIN HFA) 108 (90 Base) MCG/ACT inhaler     Endocrine   Controlled type 2 diabetes mellitus  - Primary (Chronic)    Completed labs: A1c 7.1 patients goal is  less than 7. She is managed by endocrinology. Continue low sodium diet, weight loss and exercise.       Relevant Orders   Bayer DCA Hb A1c Waived   CBC with Differential/Platelet   CMP14+EGFR   Hemoglobin A1c   Lipid panel   Microalbumin / creatinine urine ratio   Vitamin B12   VITAMIN D 25 Hydroxy (Vit-D Deficiency, Fractures)   Other Visit Diagnoses     Constipation, unspecified constipation type       Relevant Medications   lubiprostone (AMITIZA) 24 MCG capsule       Meds ordered  this encounter  Medications   albuterol (VENTOLIN HFA) 108 (90 Base) MCG/ACT inhaler    Sig: Inhale 2 puffs into the lungs every 6 (six) hours as needed for wheezing or shortness of breath.    Dispense:  8.5 g    Refill:  0   lubiprostone (AMITIZA) 24 MCG capsule    Sig: Take 1 capsule (24 mcg total) by mouth 2 (two) times daily with a meal.    Dispense:  60 capsule    Refill:  0    Order Specific Question:   Supervising Provider    Answer:   Jeneen Rinks    Return if symptoms worsen or fail to improve.  Ivy Lynn, NP

## 2022-09-25 LAB — CBC WITH DIFFERENTIAL/PLATELET
Basophils Absolute: 0.1 10*3/uL (ref 0.0–0.2)
Basos: 1 %
EOS (ABSOLUTE): 0.2 10*3/uL (ref 0.0–0.4)
Eos: 3 %
Hematocrit: 42 % (ref 34.0–46.6)
Hemoglobin: 13.5 g/dL (ref 11.1–15.9)
Immature Grans (Abs): 0 10*3/uL (ref 0.0–0.1)
Immature Granulocytes: 0 %
Lymphocytes Absolute: 3 10*3/uL (ref 0.7–3.1)
Lymphs: 41 %
MCH: 28.7 pg (ref 26.6–33.0)
MCHC: 32.1 g/dL (ref 31.5–35.7)
MCV: 89 fL (ref 79–97)
Monocytes Absolute: 0.5 10*3/uL (ref 0.1–0.9)
Monocytes: 7 %
Neutrophils Absolute: 3.5 10*3/uL (ref 1.4–7.0)
Neutrophils: 48 %
Platelets: 242 10*3/uL (ref 150–450)
RBC: 4.7 x10E6/uL (ref 3.77–5.28)
RDW: 13.1 % (ref 11.7–15.4)
WBC: 7.3 10*3/uL (ref 3.4–10.8)

## 2022-09-25 LAB — LIPID PANEL
Chol/HDL Ratio: 2.6 ratio (ref 0.0–4.4)
Cholesterol, Total: 130 mg/dL (ref 100–199)
HDL: 50 mg/dL (ref >39)
LDL Chol Calc (NIH): 61 mg/dL (ref 0–99)
Triglycerides: 103 mg/dL (ref 0–149)
VLDL Cholesterol Cal: 19 mg/dL (ref 5–40)

## 2022-09-25 LAB — MICROALBUMIN / CREATININE URINE RATIO
Creatinine, Urine: 91.6 mg/dL
Microalb/Creat Ratio: 39 mg/g creat — ABNORMAL HIGH (ref 0–29)
Microalbumin, Urine: 35.8 ug/mL

## 2022-09-25 LAB — CMP14+EGFR
ALT: 10 IU/L (ref 0–32)
AST: 14 IU/L (ref 0–40)
Albumin/Globulin Ratio: 1.7 (ref 1.2–2.2)
Albumin: 4.1 g/dL (ref 3.8–4.9)
Alkaline Phosphatase: 74 IU/L (ref 44–121)
BUN/Creatinine Ratio: 22 (ref 12–28)
BUN: 20 mg/dL (ref 8–27)
Bilirubin Total: 0.3 mg/dL (ref 0.0–1.2)
CO2: 25 mmol/L (ref 20–29)
Calcium: 9.8 mg/dL (ref 8.7–10.3)
Chloride: 106 mmol/L (ref 96–106)
Creatinine, Ser: 0.93 mg/dL (ref 0.57–1.00)
Globulin, Total: 2.4 g/dL (ref 1.5–4.5)
Glucose: 108 mg/dL — ABNORMAL HIGH (ref 70–99)
Potassium: 3.7 mmol/L (ref 3.5–5.2)
Sodium: 142 mmol/L (ref 134–144)
Total Protein: 6.5 g/dL (ref 6.0–8.5)
eGFR: 70 mL/min/{1.73_m2} (ref >59)

## 2022-09-25 LAB — VITAMIN B12: Vitamin B-12: 348 pg/mL (ref 232–1245)

## 2022-09-25 LAB — VITAMIN D 25 HYDROXY (VIT D DEFICIENCY, FRACTURES): Vit D, 25-Hydroxy: 18.6 ng/mL — ABNORMAL LOW (ref 30.0–100.0)

## 2022-09-26 ENCOUNTER — Other Ambulatory Visit: Payer: Self-pay | Admitting: Nurse Practitioner

## 2022-09-26 DIAGNOSIS — E559 Vitamin D deficiency, unspecified: Secondary | ICD-10-CM

## 2022-09-26 MED ORDER — VITAMIN D3 50 MCG (2000 UT) PO CAPS: 2000.0000 [IU] | ORAL_CAPSULE | Freq: Every day | ORAL | 1 refills | Status: DC

## 2022-09-27 MED ORDER — VITAMIN D3 50 MCG (2000 UT) PO CAPS: 2000.0000 [IU] | ORAL_CAPSULE | Freq: Every day | ORAL | 1 refills | Status: DC

## 2022-09-27 NOTE — Addendum Note (Signed)
Addended by: Antonietta Barcelona D on: 09/27/2022 08:02 AM   Modules accepted: Orders

## 2022-09-27 NOTE — Progress Notes (Signed)
Rx failed. resent

## 2022-10-04 ENCOUNTER — Encounter: Payer: Self-pay | Admitting: Nurse Practitioner

## 2022-10-04 ENCOUNTER — Ambulatory Visit (INDEPENDENT_AMBULATORY_CARE_PROVIDER_SITE_OTHER): Payer: Commercial Managed Care - HMO | Admitting: Nurse Practitioner

## 2022-10-04 VITALS — BP 131/68 | HR 54 | Temp 97.9°F | Resp 20 | Ht 63.0 in | Wt 242.0 lb

## 2022-10-04 DIAGNOSIS — M5432 Sciatica, left side: Secondary | ICD-10-CM | POA: Diagnosis not present

## 2022-10-04 DIAGNOSIS — B070 Plantar wart: Secondary | ICD-10-CM | POA: Diagnosis not present

## 2022-10-04 MED ORDER — METHYLPREDNISOLONE ACETATE 40 MG/ML IJ SUSP
40.0000 mg | Freq: Once | INTRAMUSCULAR | Status: AC
  Administered 2022-10-04: 40 mg via INTRAMUSCULAR

## 2022-10-04 MED ORDER — KETOROLAC TROMETHAMINE 60 MG/2ML IM SOLN
60.0000 mg | Freq: Once | INTRAMUSCULAR | Status: AC
  Administered 2022-10-04: 60 mg via INTRAMUSCULAR

## 2022-10-04 NOTE — Patient Instructions (Signed)
Sciatica  Sciatica is pain, weakness, tingling, or loss of feeling (numbness) along the sciatic nerve. The sciatic nerve starts in the lower back and goes down the back of each leg. Sciatica usually affects one side of the body. Sciatica usually goes away on its own or with treatment. Sometimes, sciatica may come back. What are the causes? This condition happens when the sciatic nerve is pinched or has pressure put on it. This may be caused by: A disk in between the bones of the spine bulging out too far (herniated disk). Changes in the spinal disks due to aging. A condition that affects a muscle in the butt. Extra bone growth near the sciatic nerve. A break (fracture) of the area between your hip bones (pelvis). Pregnancy. Tumor. This is rare. What increases the risk? You are more likely to develop this condition if you: Play sports that put pressure or stress on the spine. Have poor strength and ease of movement (flexibility). Have had a back injury or back surgery. Sit for long periods of time. Do activities that involve bending or lifting over and over again. Are very overweight (obese). What are the signs or symptoms? Symptoms can vary from mild to very bad. They may include: Any of these problems in the lower back, leg, hip, or butt: Mild tingling, loss of feeling, or dull aches. A burning feeling. Sharp pains. Loss of feeling in the back of the calf or the sole of the foot. Leg weakness. Very bad back pain that makes it hard to move. These symptoms may get worse when you cough, sneeze, or laugh. They may also get worse when you sit or stand for long periods of time. How is this treated? This condition often gets better without any treatment. However, treatment may include: Changing or cutting back on physical activity when you have pain. Exercising, including strengthening and stretching. Putting ice or heat on the affected area. Shots of medicines to relieve pain and  swelling or to relax your muscles. Surgery. Follow these instructions at home: Medicines Take over-the-counter and prescription medicines only as told by your doctor. Ask your doctor if you should avoid driving or using machines while you are taking your medicine. Managing pain     If told, put ice on the affected area. To do this: Put ice in a plastic bag. Place a towel between your skin and the bag. Leave the ice on for 20 minutes, 2-3 times a day. If your skin turns bright red, take off the ice right away to prevent skin damage. The risk of skin damage is higher if you cannot feel pain, heat, or cold. If told, put heat on the affected area. Do this as often as told by your doctor. Use the heat source that your doctor tells you to use, such as a moist heat pack or a heating pad. Place a towel between your skin and the heat source. Leave the heat on for 20-30 minutes. If your skin turns bright red, take off the heat right away to prevent burns. The risk of burns is higher if you cannot feel pain, heat, or cold. Activity  Return to your normal activities when your doctor says that it is safe. Avoid activities that make your symptoms worse. Take short rests during the day. When you rest for a long time, do some physical activity or stretching between periods of rest. Avoid sitting for a long time without moving. Get up and move around at least one time each   hour. Do exercises and stretches as told by your doctor. Do not lift anything that is heavier than 10 lb (4.5 kg). Avoid lifting heavy things even when you do not have symptoms. Avoid lifting heavy things over and over. When you lift objects, always lift in a way that is safe for your body. To do this, you should: Bend your knees. Keep the object close to your body. Avoid twisting. General instructions Stay at a healthy weight. Wear comfortable shoes that support your feet. Avoid wearing high heels. Avoid sleeping on a mattress  that is too soft or too hard. You might have less pain if you sleep on a mattress that is firm enough to support your back. Contact a doctor if: Your pain is not controlled by medicine. Your pain does not get better. Your pain gets worse. Your pain lasts longer than 4 weeks. You lose weight without trying. Get help right away if: You cannot control when you pee (urinate) or poop (have a bowel movement). You have weakness in any of these areas and it gets worse: Lower back. The area between your hip bones. Butt. Legs. You have redness or swelling of your back. You have a burning feeling when you pee. Summary Sciatica is pain, weakness, tingling, or loss of feeling (numbness) along the sciatic nerve. This may include the lower back, legs, hips, and butt. This condition happens when the sciatic nerve is pinched or has pressure put on it. Treatment often includes rest, exercise, medicines, and putting ice or heat on the affected area. This information is not intended to replace advice given to you by your health care provider. Make sure you discuss any questions you have with your health care provider. Document Revised: 12/14/2021 Document Reviewed: 12/14/2021 Elsevier Patient Education  2023 [employer] Inc.  

## 2022-10-04 NOTE — Progress Notes (Signed)
   Subjective:    Patient ID: Kathryn Merritt, female    DOB: December 05, 1960, 62 y.o.   MRN: [id number]   Chief Complaint: Pain in left butt check radiating down to left thigh and Plantars wart left foot   HPI Patient comes in  today with 2 complaints: - Pain in left buttocks that radiates down her left leg. Started  about 1 month ago. Slightly worse. Rates pain 6-7/10. Only thing she has tried at home is tylenol which helps a little for a few hours.  - plantars wart on bottom of left foot which hurts when she walks.     Review of Systems  Constitutional:  Negative for diaphoresis.  Eyes:  Negative for pain.  Respiratory:  Negative for shortness of breath.   Cardiovascular:  Negative for chest pain, palpitations and leg swelling.  Gastrointestinal:  Negative for abdominal pain.  Endocrine: Negative for polydipsia.  Skin:  Negative for rash.  Neurological:  Negative for dizziness, weakness and headaches.  Hematological:  Does not bruise/bleed easily.  All other systems reviewed and are negative.      Objective:   Physical Exam Vitals reviewed.  Constitutional:      Appearance: Normal appearance.  Cardiovascular:     Rate and Rhythm: Normal rate and regular rhythm.     Heart sounds: Normal heart sounds.  Pulmonary:     Effort: Pulmonary effort is normal.     Breath sounds: Normal breath sounds.  Musculoskeletal:     Comments: Rises slowly from sitting  to standing Limbs on left foot when walking (-) SLR bil Motor strength and sensation distally intact  Skin:    General: Skin is warm.     Comments: Hard flesh colored lesion bottom left foot. Extremely tender  to touch  Neurological:     General: No focal deficit present.     Mental Status: She is alert and oriented to person, place, and time.  Psychiatric:        Mood and Affect: Mood normal.        Behavior: Behavior normal.    BP 131/68   Pulse (!) 54   Temp 97.9 F (36.6 C) (Temporal)   Resp 20   Ht '5\' 3"'$  (1.6  m)   Wt 242 lb (109.8 kg)   SpO2 99%   BMI 42.87 kg/m    Last hgba1c 7.1% 09/24/22     Assessment & Plan:  Kathryn Merritt in today with chief complaint of Pain in left butt check radiating down to left thigh and Plantars wart left foot   1. Sciatica of left side Moist heat rest - methylPREDNISolone acetate (DEPO-MEDROL) injection 40 mg - ketorolac (TORADOL) injection 60 mg  2. Plantar wart of left foot Continue compound W wart pads RTO prn    The above assessment and management plan was discussed with the patient. The patient verbalized understanding of and has agreed to the management plan. Patient is aware to call the clinic if symptoms persist or worsen. Patient is aware when to return to the clinic for a follow-up visit. Patient educated on when it is appropriate to go to the emergency department.   Mary-Margaret Hassell Done, FNP

## 2022-10-05 ENCOUNTER — Ambulatory Visit: Payer: 59 | Admitting: Nurse Practitioner

## 2022-10-07 ENCOUNTER — Ambulatory Visit: Payer: Commercial Managed Care - HMO | Admitting: Podiatry

## 2022-10-11 ENCOUNTER — Encounter: Payer: Self-pay | Admitting: Family Medicine

## 2022-10-11 ENCOUNTER — Ambulatory Visit (INDEPENDENT_AMBULATORY_CARE_PROVIDER_SITE_OTHER): Payer: Commercial Managed Care - HMO | Admitting: Family Medicine

## 2022-10-11 VITALS — BP 135/82 | Temp 96.7°F | Resp 20 | Ht 63.0 in | Wt 239.0 lb

## 2022-10-11 DIAGNOSIS — R42 Dizziness and giddiness: Secondary | ICD-10-CM | POA: Diagnosis not present

## 2022-10-11 DIAGNOSIS — M5432 Sciatica, left side: Secondary | ICD-10-CM

## 2022-10-11 MED ORDER — MECLIZINE HCL 12.5 MG PO TABS: 12.5000 mg | ORAL_TABLET | Freq: Three times a day (TID) | ORAL | 0 refills | Status: DC | PRN

## 2022-10-11 MED ORDER — PREDNISONE 20 MG PO TABS: 20.0000 mg | ORAL_TABLET | Freq: Every day | ORAL | 0 refills | Status: AC

## 2022-10-11 NOTE — Patient Instructions (Signed)
Back Exercises ?The following exercises strengthen the muscles that help to support the trunk (torso) and back. They also help to keep the lower back flexible. Doing these exercises can help to prevent or lessen existing low back pain. ?If you have back pain or discomfort, try doing these exercises 2-3 times each day or as told by your health care provider. ?As your pain improves, do them once each day, but increase the number of times that you repeat the steps for each exercise (do more repetitions). ?To prevent the recurrence of back pain, continue to do these exercises once each day or as told by your health care provider. ?Do exercises exactly as told by your health care provider and adjust them as directed. It is normal to feel mild stretching, pulling, tightness, or discomfort as you do these exercises, but you should stop right away if you feel sudden pain or your pain gets worse. ?Exercises ?Single knee to chest ?Repeat these steps 3-5 times for each leg: ?Lie on your back on a firm bed or the floor with your legs extended. ?Bring one knee to your chest. Your other leg should stay extended and in contact with the floor. ?Hold your knee in place by grabbing your knee or thigh with both hands and hold. ?Pull on your knee until you feel a gentle stretch in your lower back or buttocks. ?Hold the stretch for 10-30 seconds. ?Slowly release and straighten your leg. ? ?Pelvic tilt ?Repeat these steps 5-10 times: ?Lie on your back on a firm bed or the floor with your legs extended. ?Bend your knees so they are pointing toward the ceiling and your feet are flat on the floor. ?Tighten your lower abdominal muscles to press your lower back against the floor. This motion will tilt your pelvis so your tailbone points up toward the ceiling instead of pointing to your feet or the floor. ?With gentle tension and even breathing, hold this position for 5-10 seconds. ? ?Cat-cow ?Repeat these steps until your lower back becomes  more flexible: ?Get into a hands-and-knees position on a firm bed or the floor. Keep your hands under your shoulders, and keep your knees under your hips. You may place padding under your knees for comfort. ?Let your head hang down toward your chest. Contract your abdominal muscles and point your tailbone toward the floor so your lower back becomes rounded like the back of a cat. ?Hold this position for 5 seconds. ?Slowly lift your head, let your abdominal muscles relax, and point your tailbone up toward the ceiling so your back forms a sagging arch like the back of a cow. ?Hold this position for 5 seconds. ? ?Press-ups ?Repeat these steps 5-10 times: ?Lie on your abdomen (face-down) on a firm bed or the floor. ?Place your palms near your head, about shoulder-width apart. ?Keeping your back as relaxed as possible and keeping your hips on the floor, slowly straighten your arms to raise the top half of your body and lift your shoulders. Do not use your back muscles to raise your upper torso. You may adjust the placement of your hands to make yourself more comfortable. ?Hold this position for 5 seconds while you keep your back relaxed. ?Slowly return to lying flat on the floor. ? ?Bridges ?Repeat these steps 10 times: ?Lie on your back on a firm bed or the floor. ?Bend your knees so they are pointing toward the ceiling and your feet are flat on the floor. Your arms should be flat   at your sides, next to your body. ?Tighten your buttocks muscles and lift your buttocks off the floor until your waist is at almost the same height as your knees. You should feel the muscles working in your buttocks and the back of your thighs. If you do not feel these muscles, slide your feet 1-2 inches (2.5-5 cm) farther away from your buttocks. ?Hold this position for 3-5 seconds. ?Slowly lower your hips to the starting position, and allow your buttocks muscles to relax completely. ?If this exercise is too easy, try doing it with your arms  crossed over your chest. ?Abdominal crunches ?Repeat these steps 5-10 times: ?Lie on your back on a firm bed or the floor with your legs extended. ?Bend your knees so they are pointing toward the ceiling and your feet are flat on the floor. ?Cross your arms over your chest. ?Tip your chin slightly toward your chest without bending your neck. ?Tighten your abdominal muscles and slowly raise your torso high enough to lift your shoulder blades a tiny bit off the floor. Avoid raising your torso higher than that because it can put too much stress on your lower back and does not help to strengthen your abdominal muscles. ?Slowly return to your starting position. ? ?Back lifts ?Repeat these steps 5-10 times: ?Lie on your abdomen (face-down) with your arms at your sides, and rest your forehead on the floor. ?Tighten the muscles in your legs and your buttocks. ?Slowly lift your chest off the floor while you keep your hips pressed to the floor. Keep the back of your head in line with the curve in your back. Your eyes should be looking at the floor. ?Hold this position for 3-5 seconds. ?Slowly return to your starting position. ? ?Contact a health care provider if: ?Your back pain or discomfort gets much worse when you do an exercise. ?Your worsening back pain or discomfort does not lessen within 2 hours after you exercise. ?If you have any of these problems, stop doing these exercises right away. Do not do them again unless your health care provider says that you can. ?Get help right away if: ?You develop sudden, severe back pain. If this happens, stop doing the exercises right away. Do not do them again unless your health care provider says that you can. ?This information is not intended to replace advice given to you by your health care provider. Make sure you discuss any questions you have with your health care provider. ?Document Revised: 03/03/2021 Document Reviewed: 11/19/2020 ?Elsevier Patient Education ? 2023 [employer]  Inc[employer] ? ?

## 2022-10-11 NOTE — Progress Notes (Signed)
Acute Office Visit  Subjective:     Patient ID: Kathryn Merritt, female    DOB: [birth year], 62 y.o.   MRN: [id number]  Chief Complaint  Patient presents with   Dizziness    Dizziness This is a new problem. The current episode started in the past 7 days. The problem occurs intermittently. The problem has been waxing and waning. Associated symptoms include vertigo. Pertinent negatives include no abdominal pain, anorexia, chest pain, chills, congestion, coughing, diaphoresis, fatigue, fever, headaches, nausea, neck pain, numbness, sore throat, urinary symptoms, visual change, vomiting or weakness. The symptoms are aggravated by bending. She has tried nothing for the symptoms.  Dizziness last few a few seconds at a time. Dizziness occurs with certain movements, like bending over, moving her head, or with a deep breath. It feels like her vision doesn't keep up. Denies palpitations or shortness of breath. She recently had unremarkable labs.   She also reports ongoing left sided sciatica. Pain radiates from buttocks down the back on her leg. It feels tight. Pain is worse with increased activity or after sitting for extended periods. She denies injury, numbness, tingling, saddle anesthesia, or changes in bowel or bladder control. She was evaluated on 10/04/21 and given a toradol and steroid IM injections. She reports that this had mild improvement.    Review of Systems  Constitutional:  Negative for chills, diaphoresis, fatigue and fever.  HENT:  Negative for congestion and sore throat.   Respiratory:  Negative for cough.   Cardiovascular:  Negative for chest pain.  Gastrointestinal:  Negative for abdominal pain, anorexia, nausea and vomiting.  Musculoskeletal:  Negative for neck pain.  Neurological:  Positive for dizziness and vertigo. Negative for weakness, numbness and headaches.        Objective:    BP 135/82   Temp (!) 96.7 F (35.9 C) (Oral)   Resp 20   Ht '5\' 3"'$  (1.6 m)   Wt 239  lb (108.4 kg)   SpO2 99%   BMI 42.34 kg/m       Physical Exam Vitals and nursing note reviewed.  Constitutional:      General: She is not in acute distress.    Appearance: She is not ill-appearing, toxic-appearing or diaphoretic.  HENT:     Head: Normocephalic and atraumatic.     Right Ear: Tympanic membrane, ear canal and external ear normal.     Left Ear: Tympanic membrane, ear canal and external ear normal.     Nose: Nose normal.     Mouth/Throat:     Mouth: Mucous membranes are moist.     Pharynx: Oropharynx is clear. No oropharyngeal exudate or posterior oropharyngeal erythema.  Eyes:     General: No scleral icterus.       Right eye: No discharge.        Left eye: No discharge.     Extraocular Movements: Extraocular movements intact.     Pupils: Pupils are equal, round, and reactive to light.  Cardiovascular:     Rate and Rhythm: Normal rate and regular rhythm.     Heart sounds: Normal heart sounds. No murmur heard. Pulmonary:     Effort: Pulmonary effort is normal. No respiratory distress.     Breath sounds: Normal breath sounds.  Musculoskeletal:     Cervical back: Neck supple. No rigidity.     Lumbar back: No swelling, edema, deformity or signs of trauma. Positive left straight leg raise test.     Right lower leg: No  edema.     Left lower leg: No edema.  Skin:    General: Skin is warm and dry.  Neurological:     General: No focal deficit present.     Mental Status: She is alert and oriented to person, place, and time.     Cranial Nerves: No cranial nerve deficit.     Sensory: No sensory deficit.     Motor: No weakness.     Coordination: Coordination normal.     Gait: Gait abnormal (antalgic).  Psychiatric:        Mood and Affect: Mood normal.        Behavior: Behavior normal.     No results found for any visits on 10/11/22.      Assessment & Plan:  Ayo was seen today for dizziness.  Diagnoses and all orders for this visit:  Vertigo Short  episodes with movement, no other symptoms. Normal neuro exam today. Reviewed recent labs- unremarkable. Meclizine and prednisone burst as below. Discussed hydration and moving slowly. Strict return precautions.  -     meclizine (ANTIVERT) 12.5 MG tablet; Take 1 tablet (12.5 mg total) by mouth 3 (three) times daily as needed for dizziness. -     predniSONE (DELTASONE) 20 MG tablet; Take 1 tablet (20 mg total) by mouth daily with breakfast for 5 days.  Sciatica of left side Past xray report with lumbar DDD. Prednisone burst as below. Handout with exercises given. Declined referral to PT today. Tylenol prn. NSAIDs contraindicated due to cardiac history. Discussed rest, heat, massage, stretching.  -     predniSONE (DELTASONE) 20 MG tablet; Take 1 tablet (20 mg total) by mouth daily with breakfast for 5 days.  Return if symptoms worsen or fail to improve.  The patient indicates understanding of these issues and agrees with the plan.  Gwenlyn Perking, FNP

## 2022-10-14 ENCOUNTER — Other Ambulatory Visit: Payer: Self-pay | Admitting: Nurse Practitioner

## 2022-10-22 ENCOUNTER — Telehealth: Payer: Self-pay | Admitting: Cardiology

## 2022-10-22 NOTE — Telephone Encounter (Signed)
Pt c/o medication issue:  1. Name of Medication: ELIQUIS 5 MG TABS tablet   2. How are you currently taking this medication (dosage and times per day)?    3. Are you having a reaction (difficulty breathing--STAT)? no  4. What is your medication issue? Needs prior auth for this medication. Please advise

## 2022-10-25 ENCOUNTER — Telehealth: Payer: Commercial Managed Care - HMO | Admitting: Physician Assistant

## 2022-10-25 DIAGNOSIS — R001 Bradycardia, unspecified: Secondary | ICD-10-CM | POA: Diagnosis not present

## 2022-10-25 DIAGNOSIS — I959 Hypotension, unspecified: Secondary | ICD-10-CM | POA: Diagnosis not present

## 2022-10-25 NOTE — Progress Notes (Signed)
Virtual Visit Consent   Kathryn Merritt, you are scheduled for a virtual visit with a [facility] provider today. Just as with appointments in the office, your consent must be obtained to participate. Your consent will be active for this visit and any virtual visit you may have with one of our providers in the next 365 days. If you have a MyChart account, a copy of this consent can be sent to you electronically.  As this is a virtual visit, video technology does not allow for your provider to perform a traditional examination. This may limit your provider's ability to fully assess your condition. If your provider identifies any concerns that need to be evaluated in person or the need to arrange testing (such as labs, EKG, etc.), we will make arrangements to do so. Although advances in technology are sophisticated, we cannot ensure that it will always work on either your end or our end. If the connection with a video visit is poor, the visit may have to be switched to a telephone visit. With either a video or telephone visit, we are not always able to ensure that we have a secure connection.  By engaging in this virtual visit, you consent to the provision of healthcare and authorize for your insurance to be billed (if applicable) for the services provided during this visit. Depending on your insurance coverage, you may receive a charge related to this service.  I need to obtain your verbal consent now. Are you willing to proceed with your visit today? Kathryn Merritt has provided verbal consent on 10/25/2022 for a virtual visit (video or telephone). Mar Daring, PA-C  Date: 10/25/2022 6:37 PM  Virtual Visit via Video Note   I, Mar Daring, connected with  Kathryn Merritt  ([id number], 02-Jul-1961) on 10/25/22 at  6:30 PM EST by a video-enabled telemedicine application and verified that I am speaking with the correct person using two identifiers.  Location: Patient: Virtual Visit Location  Patient: Home Provider: Virtual Visit Location Provider: Home Office   I discussed the limitations of evaluation and management by telemedicine and the availability of in person appointments. The patient expressed understanding and agreed to proceed.    History of Present Illness: Kathryn Merritt is a 62 y.o. who identifies as a female who was assigned female at birth, and is being seen today for blood pressure. Since Thursday of last week have BP drop low for her. She is normally in 130s/80s HR upper 50s. Since Thursday she has been having in the 110s/50s. Improves with moving around. Having some light headedness and dizziness, seen her PCP on 10/11/22 for dizziness and diagnosed with Vertigo. Thursday night (10/21/22) woke up around 1 am and when she went to restroom did have an episode where she felt like she was falling backwards and had to grab a hold of the towel rack. Then Saturday in the shower had a similar episode. Those have been the only two episodes of recent. Not very symptomatic with change of positions. Does endorse having some increased fatigue. Overall, fairly stable symptoms, not much change.   BP readings today: BP: 5:53pm 117/59 HR 54, 5:27pm 105/55 HR 57, 109/56 HR 55, 1:22pm 129/77 HR 57 6:54 am 152/83 HR 55 while at work  Also, mentions feeling of a "shake" in her chest. Last less than a second or two. Does not feel like a flutter or palpitation, but like someone just physically shook her chest.   Denies SOB, DOE,  edema, chest pain, no occult lightheadedness or prodrome of syncope  Problems:  Patient Active Problem List   Diagnosis Date Noted   Lesion of left ear 06/24/2022   Vitamin D deficiency 01/21/2022   Stage 3a chronic kidney disease ([place]) 01/21/2022   Esophageal dysphagia 01/13/2022   Urinary tract infection with hematuria 10/23/2021   Subclinical hypothyroidism 10/23/2021   Annual physical exam 09/23/2021   Bronchitis 09/09/2021   Gastroesophageal reflux  disease 07/16/2021   OSA (obstructive sleep apnea) 07/14/2021   Establishing care with new doctor, encounter for 06/18/2021   Hypertension 06/18/2021   Hyperlipidemia 06/18/2021   Paroxysmal atrial fibrillation ([place]) 03/18/2021   Obesity, Class III, BMI 40-49.9 (morbid obesity) ([place]) 03/18/2021   Controlled type 2 diabetes mellitus  03/18/2021    Allergies: No Known Allergies Medications:  Current Outpatient Medications:    acetaminophen (TYLENOL) 500 MG tablet, Take 1,000 mg by mouth every 8 (eight) hours as needed for moderate pain., Disp: , Rfl:    albuterol (VENTOLIN HFA) 108 (90 Base) MCG/ACT inhaler, Inhale 2 puffs into the lungs every 6 (six) hours as needed for wheezing or shortness of breath., Disp: 8.5 g, Rfl: 0   amLODipine (NORVASC) 10 MG tablet, TAKE ONE TABLET BY MOUTH DAILY, Disp: 90 tablet, Rfl: 3   blood glucose meter kit and supplies, Dispense based on patient and insurance preference. Use up to four times daily as directed. (FOR ICD-10 E10.9, E11.9)., Disp: 1 each, Rfl: 0   Cholecalciferol (VITAMIN D3) 50 MCG (2000 UT) capsule, Take 1 capsule (2,000 Units total) by mouth daily., Disp: 90 capsule, Rfl: 1   diclofenac Sodium (VOLTAREN) 1 % GEL, Apply 2 g topically 4 (four) times daily. (Patient not taking: Reported on 10/11/2022), Disp: 150 g, Rfl: 2   ELIQUIS 5 MG TABS tablet, TAKE ONE TABLET BY MOUTH TWICE DAILY, Disp: 60 tablet, Rfl: 5   ENULOSE 10 GM/15ML SOLN, Take by mouth. (Patient not taking: Reported on 10/11/2022), Disp: , Rfl:    lactulose (CHRONULAC) 10 GM/15ML solution, Take 30 mLs (20 g total) by mouth 2 (two) times daily. For constipation (Patient not taking: Reported on 10/11/2022), Disp: 1800 mL, Rfl: 3   lisinopril-hydrochlorothiazide (ZESTORETIC) 20-25 MG tablet, Take 1 tablet by mouth daily., Disp: 60 tablet, Rfl: 2   lubiprostone (AMITIZA) 24 MCG capsule, Take 1 capsule (24 mcg total) by mouth 2 (two) times daily with a meal., Disp: 60 capsule, Rfl: 0    meclizine (ANTIVERT) 12.5 MG tablet, Take 1 tablet (12.5 mg total) by mouth 3 (three) times daily as needed for dizziness., Disp: 30 tablet, Rfl: 0   metoprolol succinate (TOPROL-XL) 50 MG 24 hr tablet, TAKE ONE TABLET BY MOUTH DAILY. TAKE WITH OR IMMEDIATELY FOLLOWING A MEAL. (Patient taking differently: Take 50 mg by mouth daily.), Disp: 90 tablet, Rfl: 3   omeprazole (PRILOSEC) 40 MG capsule, TAKE ONE CAPSULE BY MOUTH DAILY, Disp: 30 capsule, Rfl: 3   rosuvastatin (CRESTOR) 10 MG tablet, Take 1 tablet (10 mg total) by mouth daily., Disp: 90 tablet, Rfl: 1   TRUE METRIX BLOOD GLUCOSE TEST test strip, SMARTSIG:Via Meter 1-4 Times Daily, Disp: , Rfl:    TRUEplus Lancets 30G MISC, USE UP TO FOUR TIMES DAILY AS DIRECTED, Disp: , Rfl:    TRULICITY 4.5 BJ/6.2GB SOPN, INJECT 4.5 MG SUBCUTANEOUSLY ONCE A WEEK, Disp: 12 mL, Rfl: 0  Observations/Objective: Patient is well-developed, well-nourished in no acute distress.  Resting comfortably at home.  Head is normocephalic, atraumatic.  No labored  breathing.  Speech is clear and coherent with logical content.  Patient is alert and oriented at baseline.    Assessment and Plan: 1. Hypotension, unspecified hypotension type  2. 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 The patient was provided an opportunity to ask questions and all were answered. The patient agreed with the plan and demonstrated an understanding of the instructions.  A copy of instructions were sent to the patient via MyChart unless otherwise noted below.    The patient was advised to call back or seek an in-person evaluation if the symptoms worsen or if the condition fails to improve as anticipated.  Time:  I spent 40 minutes with the patient via telehealth technology discussing the above problems/concerns.  I have reviewed patients recent PCP office visits, Cardiology notes, labs, EKGs prior and post speaking with patient. Discussed following up for further testing for hypotensive episodes and bradycardia.  Mar Daring, PA-C

## 2022-10-25 NOTE — Telephone Encounter (Signed)
**Note De-Identified Kathryn Merritt Obfuscation** Eliquis PA started through covermymeds. Key: [id number]

## 2022-10-25 NOTE — Telephone Encounter (Signed)
**Note De-Identified Dewitt Judice Obfuscation** Cathlean Sauer (Key: [id number]) PA Case ID #: [id number] Outcome Approved today Prior Auth;Coverage Start Date:10/25/2022;Coverage End Date:10/25/2023; Authorization Expiration Date: 10/24/2023 Drug Eliquis '5MG'$  tablets ePA cloud Secretary/administrator PA Form [id number] NCPDP)  I have notified [place] [address] (Ph: [phone]) of this approval.

## 2022-10-25 NOTE — Patient Instructions (Signed)
[name], thank you for joining Mar Daring, PA-C for today's virtual visit.  While this provider is not your primary care provider (PCP), if your PCP is located in our provider database this encounter information will be shared with them immediately following your visit.   [facility] account gives you access to today's visit and all your visits, tests, and labs performed at [facility] " click here if you don't have a [facility] account or go to [email or web address]  Consent: (Patient) Kathryn Merritt provided verbal consent for this virtual visit at the beginning of the encounter.  Current Medications:  Current Outpatient Medications:    acetaminophen (TYLENOL) 500 MG tablet, Take 1,000 mg by mouth every 8 (eight) hours as needed for moderate pain., Disp: , Rfl:    albuterol (VENTOLIN HFA) 108 (90 Base) MCG/ACT inhaler, Inhale 2 puffs into the lungs every 6 (six) hours as needed for wheezing or shortness of breath., Disp: 8.5 g, Rfl: 0   amLODipine (NORVASC) 10 MG tablet, TAKE ONE TABLET BY MOUTH DAILY, Disp: 90 tablet, Rfl: 3   blood glucose meter kit and supplies, Dispense based on patient and insurance preference. Use up to four times daily as directed. (FOR ICD-10 E10.9, E11.9)., Disp: 1 each, Rfl: 0   Cholecalciferol (VITAMIN D3) 50 MCG (2000 UT) capsule, Take 1 capsule (2,000 Units total) by mouth daily., Disp: 90 capsule, Rfl: 1   diclofenac Sodium (VOLTAREN) 1 % GEL, Apply 2 g topically 4 (four) times daily. (Patient not taking: Reported on 10/11/2022), Disp: 150 g, Rfl: 2   ELIQUIS 5 MG TABS tablet, TAKE ONE TABLET BY MOUTH TWICE DAILY, Disp: 60 tablet, Rfl: 5   ENULOSE 10 GM/15ML SOLN, Take by mouth. (Patient not taking: Reported on 10/11/2022), Disp: , Rfl:    lactulose (CHRONULAC) 10 GM/15ML solution, Take 30 mLs (20 g total) by mouth 2 (two) times daily. For constipation (Patient not taking: Reported on 10/11/2022), Disp: 1800 mL, Rfl:  3   lisinopril-hydrochlorothiazide (ZESTORETIC) 20-25 MG tablet, Take 1 tablet by mouth daily., Disp: 60 tablet, Rfl: 2   lubiprostone (AMITIZA) 24 MCG capsule, Take 1 capsule (24 mcg total) by mouth 2 (two) times daily with a meal., Disp: 60 capsule, Rfl: 0   meclizine (ANTIVERT) 12.5 MG tablet, Take 1 tablet (12.5 mg total) by mouth 3 (three) times daily as needed for dizziness., Disp: 30 tablet, Rfl: 0   metoprolol succinate (TOPROL-XL) 50 MG 24 hr tablet, TAKE ONE TABLET BY MOUTH DAILY. TAKE WITH OR IMMEDIATELY FOLLOWING A MEAL. (Patient taking differently: Take 50 mg by mouth daily.), Disp: 90 tablet, Rfl: 3   omeprazole (PRILOSEC) 40 MG capsule, TAKE ONE CAPSULE BY MOUTH DAILY, Disp: 30 capsule, Rfl: 3   rosuvastatin (CRESTOR) 10 MG tablet, Take 1 tablet (10 mg total) by mouth daily., Disp: 90 tablet, Rfl: 1   TRUE METRIX BLOOD GLUCOSE TEST test strip, SMARTSIG:Via Meter 1-4 Times Daily, Disp: , Rfl:    TRUEplus Lancets 30G MISC, USE UP TO FOUR TIMES DAILY AS DIRECTED, Disp: , Rfl:    TRULICITY 4.5 BC/4.8GQ SOPN, INJECT 4.5 MG SUBCUTANEOUSLY ONCE A WEEK, Disp: 12 mL, Rfl: 0   Medications ordered in this encounter:  No orders of the defined types were placed in this encounter.    *If you need refills on other medications prior to your next appointment, please contact your pharmacy*  Follow-Up: Call back or seek an in-person evaluation if the symptoms worsen or if  the condition fails to improve as anticipated.  [place] [phone]  Other Instructions  Hypotension As the heart beats, it forces blood through the body. Hypotension, commonly called low blood pressure, is when the force of blood pumping through the arteries is too weak. Arteries are blood vessels that carry blood from the heart throughout the body. Depending on the cause and severity, hypotension may be harmless (benign) or may cause serious problems (be critical). When your blood pressure is too low, you  may not get enough blood to your brain or to the rest of your organs. This can cause weakness, light-headedness, a rapid heartbeat, and fainting. What are the causes? This condition may be caused by: Blood loss. Loss of body fluids (dehydration). Heart problems. Hormone (endocrine) problems. Pregnancy. Severe infection. Lack of certain nutrients. Severe allergic reactions (anaphylaxis). Certain medicines, such as blood pressure medicine or medicines that make the body lose excess fluids (diuretics). Sometimes, hypotension may be caused by not taking medicine as directed, such as taking too much of a certain medicine. What increases the risk? The following factors may make you more likely to develop this condition: Age. Risk increases as you get older. Having a condition that affects the heart or the central nervous system. What are the signs or symptoms? Common symptoms of this condition include: Weakness. Light-headedness. Dizziness. Blurred vision. Tiredness (fatigue). Rapid heartbeat. Fainting, in severe cases. How is this diagnosed? This condition is diagnosed based on: Your medical history. Your symptoms. Your blood pressure measurement. Your health care provider will check your blood pressure when you are: Lying down. Sitting. Standing. A blood pressure reading is recorded as two numbers, such as "120 over 80" (or 120/80). The first ("top") number is called the systolic pressure. It is a measure of the pressure in your arteries as your heart beats. The second ("bottom") number is called the diastolic pressure. It is a measure of the pressure in your arteries when your heart relaxes between beats. Blood pressure is measured in a unit called mm Hg. Healthy blood pressure for most adults is 120/80. If your blood pressure is below 90/60, you may be diagnosed with hypotension. Other information or tests that may be used to diagnose hypotension include: Your other vital signs, such  as your heart rate and temperature. Blood tests. Tilt table test. For this test, you will be safely secured to a table that moves you from a lying position to an upright position. Your heart rhythm and blood pressure will be monitored during the test. How is this treated? Treatment for this condition may include: Changing your diet. This may involve drinking more water or increasing your salt (sodium) intake with high-sodium foods. Taking medicines to raise your blood pressure. Changing the dosage of certain medicines you are taking that might be lowering your blood pressure. Wearing compression stockings. These stockings help to prevent blood clots and reduce swelling in your legs. In some cases, you may need to go to the hospital for: Fluid replacement. This means you will receive fluids through an IV. Blood replacement. This means you will receive donated blood through an IV (transfusion). Treating an infection or heart problems, if this applies. Monitoring. You may need to be monitored while medicines that you are taking wear off. Follow these instructions at home: Eating and drinking  Drink enough fluid to keep your urine pale yellow. Eat a healthy diet, and follow instructions from your health care provider about eating or drinking restrictions. A healthy  diet includes: Fresh fruits and vegetables. Whole grains. Lean meats. Low-fat dairy products. Increase your salt intake if told to do so. Do not add extra salt to your diet unless your health care provider tells you to do that. Eat frequent, small meals. Avoid standing up suddenly after eating. Medicines Take over-the-counter and prescription medicines only as told by your health care provider. Follow instructions from your health care provider about changing the dosage of your current medicines, if this applies. Do not stop or adjust any of your medicines on your own. General instructions  Wear compression stockings as told by  your health care provider. Get up slowly from lying down or sitting positions. This gives your blood pressure a chance to adjust. Avoid hot showers and excessive heat as directed by your health care provider. Return to your normal activities as told by your health care provider. Ask your health care provider what activities are safe for you. Do not use any products that contain nicotine or tobacco. These products include cigarettes, chewing tobacco, and vaping devices, such as e-cigarettes. If you need help quitting, ask your health care provider. Keep all follow-up visits. This is important. Contact a health care provider if: You vomit. You have diarrhea. You have a fever for more than 2-3 days. You feel more thirsty than usual. You feel weak and tired. Get help right away if: You have chest pain. You have a fast or irregular heartbeat. You develop numbness in any part of your body. You cannot move your arms or your legs. You have trouble speaking. You become sweaty or feel light-headed. You faint. You feel short of breath. You have trouble staying awake. You feel confused. These symptoms may be an emergency. Get help right away. Call 911. Do not wait to see if the symptoms will go away. Do not drive yourself to the hospital. Summary Hypotension is when the force of blood pumping through the arteries is too weak. Hypotension may be harmless (benign) or may cause serious problems (be critical). Treatment for this condition may include changing your diet, changing your medicines, and wearing compression stockings. In some cases, you may need to go to the hospital for fluid or blood replacement. This information is not intended to replace advice given to you by your health care provider. Make sure you discuss any questions you have with your health care provider. Document Revised: 04/27/2021 Document Reviewed: 04/27/2021 Elsevier Patient Education  [place].    If you  have been instructed to have an in-person evaluation today at a local Urgent Care facility, please use the link below. It will take you to a list of all of our available [facility], including address, phone number and hours of operation. Please do not delay care.  [facility]  If you or a family member do not have a primary care provider, use the link below to schedule a visit and establish care. When you choose a [facility] primary care physician or advanced practice provider, you gain a long-term partner in health. Find a Primary Care Provider  Learn more about 's in-office and virtual care options: [facility]

## 2022-10-26 ENCOUNTER — Telehealth: Payer: Self-pay

## 2022-10-26 NOTE — Telephone Encounter (Signed)
Patient called to schedule office visit for hypotension.  She had a virtual visit with urgent care yesterday and was wanting to followup.  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  Patient said she will monitor blood pressure and call back.

## 2022-10-28 ENCOUNTER — Ambulatory Visit: Payer: 59 | Admitting: Nurse Practitioner

## 2022-10-28 ENCOUNTER — Ambulatory Visit (INDEPENDENT_AMBULATORY_CARE_PROVIDER_SITE_OTHER): Payer: Commercial Managed Care - HMO | Admitting: Nurse Practitioner

## 2022-10-28 ENCOUNTER — Telehealth: Payer: Self-pay | Admitting: *Deleted

## 2022-10-28 ENCOUNTER — Encounter: Payer: Self-pay | Admitting: Nurse Practitioner

## 2022-10-28 VITALS — BP 128/67 | HR 54 | Temp 97.2°F | Resp 20 | Ht 63.0 in | Wt 239.0 lb

## 2022-10-28 DIAGNOSIS — E782 Mixed hyperlipidemia: Secondary | ICD-10-CM

## 2022-10-28 DIAGNOSIS — R42 Dizziness and giddiness: Secondary | ICD-10-CM

## 2022-10-28 DIAGNOSIS — E559 Vitamin D deficiency, unspecified: Secondary | ICD-10-CM

## 2022-10-28 DIAGNOSIS — I998 Other disorder of circulatory system: Secondary | ICD-10-CM | POA: Diagnosis not present

## 2022-10-28 DIAGNOSIS — I10 Essential (primary) hypertension: Secondary | ICD-10-CM

## 2022-10-28 DIAGNOSIS — E1165 Type 2 diabetes mellitus with hyperglycemia: Secondary | ICD-10-CM

## 2022-10-28 LAB — CBC WITH DIFFERENTIAL/PLATELET
Basophils Absolute: 0.1 10*3/uL (ref 0.0–0.2)
Basos: 1 %
EOS (ABSOLUTE): 0.1 10*3/uL (ref 0.0–0.4)
Eos: 2 %
Hematocrit: 39.1 % (ref 34.0–46.6)
Hemoglobin: 13.1 g/dL (ref 11.1–15.9)
Immature Grans (Abs): 0 10*3/uL (ref 0.0–0.1)
Immature Granulocytes: 1 %
Lymphocytes Absolute: 2.6 10*3/uL (ref 0.7–3.1)
Lymphs: 37 %
MCH: 30 pg (ref 26.6–33.0)
MCHC: 33.5 g/dL (ref 31.5–35.7)
MCV: 90 fL (ref 79–97)
Monocytes Absolute: 0.4 10*3/uL (ref 0.1–0.9)
Monocytes: 5 %
Neutrophils Absolute: 3.8 10*3/uL (ref 1.4–7.0)
Neutrophils: 54 %
Platelets: 219 10*3/uL (ref 150–450)
RBC: 4.37 x10E6/uL (ref 3.77–5.28)
RDW: 13.4 % (ref 11.7–15.4)
WBC: 6.9 10*3/uL (ref 3.4–10.8)

## 2022-10-28 MED ORDER — PREDNISONE 20 MG PO TABS: 40.0000 mg | ORAL_TABLET | Freq: Every day | ORAL | 0 refills | Status: AC

## 2022-10-28 NOTE — Progress Notes (Signed)
   Subjective:    Patient ID: Kathryn Merritt, female    DOB: 1961-09-13, 62 y.o.   MRN: [id number]   Chief Complaint: BP dropping while sitting (Makes her feel dizzy and off balance)   HPI Patient says her blood pressure has been dropping. It occurs when she is sitting. The longer she sits the lower her blood pressure seems to go. The lowest it has been in 101/70. She has had intermittent vertigo for about 3 weeks. Occurs when doing different things. Episodeso nly last a few seconds    Review of Systems  Constitutional:  Negative for diaphoresis.  Eyes:  Negative for pain.  Respiratory:  Negative for shortness of breath.   Cardiovascular:  Negative for chest pain, palpitations and leg swelling.  Gastrointestinal:  Negative for abdominal pain.  Endocrine: Negative for polydipsia.  Skin:  Negative for rash.  Neurological:  Positive for dizziness. Negative for weakness and headaches.  Hematological:  Does not bruise/bleed easily.  All other systems reviewed and are negative.      Objective:   Physical Exam Vitals reviewed.  Constitutional:      Appearance: Normal appearance.  Cardiovascular:     Rate and Rhythm: Normal rate and regular rhythm.     Heart sounds: Normal heart sounds.  Pulmonary:     Breath sounds: Normal breath sounds.  Skin:    General: Skin is warm.  Neurological:     General: No focal deficit present.     Mental Status: She is alert and oriented to person, place, and time.  Psychiatric:        Mood and Affect: Mood normal.        Behavior: Behavior normal.     BP 128/67   Pulse (!) 54   Temp (!) 97.2 F (36.2 C) (Temporal)   Resp 20   Ht '5\' 3"'$  (1.6 m)   Wt 239 lb (108.4 kg)   SpO2 99%   BMI 42.34 kg/m        Assessment & Plan:   Kathryn Merritt in today with chief complaint of BP dropping while sitting (Makes her feel dizzy and off balance)   1. Vertigo Force fluids Rise slowly from sitting to standing - predniSONE (DELTASONE) 20 MG  tablet; Take 2 tablets (40 mg total) by mouth daily with breakfast for 5 days. 2 po daily for 5 days  Dispense: 10 tablet; Refill: 0  2. Fluctuating blood pressure Keep diary of blood pressure - CBC with Differential/Platelet    The above assessment and management plan was discussed with the patient. The patient verbalized understanding of and has agreed to the management plan. Patient is aware to call the clinic if symptoms persist or worsen. Patient is aware when to return to the clinic for a follow-up visit. Patient educated on when it is appropriate to go to the emergency department.   Mary-Margaret Hassell Done, FNP

## 2022-10-28 NOTE — Patient Instructions (Signed)
Vertigo Vertigo is the feeling that you or the things around you are moving when they are not. This feeling can come and go at any time. Vertigo often goes away on its own. This condition can be dangerous if it happens when you are doing activities like driving or working with machines. Your doctor will do tests to find the cause of your vertigo. These tests will also help your doctor decide on the best treatment for you. Follow these instructions at home: Eating and drinking     Drink enough fluid to keep your pee (urine) pale yellow. Do not drink alcohol. Activity Return to your normal activities when your doctor says that it is safe. In the morning, first sit up on the side of the bed. When you feel okay, stand slowly while you hold onto something until you know that your balance is fine. Move slowly. Avoid sudden body or head movements or certain positions, as told by your doctor. Use a cane if you have trouble standing or walking. Sit down right away if you feel dizzy. Avoid doing any tasks or activities that can cause danger to you or others if you get dizzy. Avoid bending down if you feel dizzy. Place items in your home so that they are easy for you to reach without bending or leaning over. Do not drive or use machinery if you feel dizzy. General instructions Take over-the-counter and prescription medicines only as told by your doctor. Keep all follow-up visits. Contact a doctor if: Your medicine does not help your vertigo. Your problems get worse or you have new symptoms. You have a fever. You feel like you may vomit (nauseous), or this feeling gets worse. You start to vomit. Your family or friends see changes in how you act. You lose feeling (have numbness) in part of your body. You feel prickling and tingling in a part of your body. Get help right away if: You are always dizzy. You faint. You get very bad headaches. You get a stiff neck. Bright light starts to bother  you. You have trouble moving or talking. You feel weak in your hands, arms, or legs. You have changes in your hearing or in how you see (vision). These symptoms may be an emergency. Get help right away. Call your local emergency services (911 in the [place] Do not wait to see if the symptoms will go away. Do not drive yourself to the hospital. Summary Vertigo is the feeling that you or the things around you are moving when they are not. Your doctor will do tests to find the cause of your vertigo. You may be told to avoid some tasks, positions, or movements. Contact a doctor if your medicine is not helping, or if you have a fever, new symptoms, or a change in how you act. Get help right away if you get very bad headaches, or if you have changes in how you speak, hear, or see. This information is not intended to replace advice given to you by your health care provider. Make sure you discuss any questions you have with your health care provider. Document Revised: 08/06/2020 Document Reviewed: 08/06/2020 Elsevier Patient Education  2023 Elsevier Inc.  

## 2022-10-28 NOTE — Telephone Encounter (Signed)
Patient left a voicemail. She states that her appointment is today , and that she thought it was tomorrow. That she was confused. She will need to cancel today's appointment. She also states that she has been trying to reach the office repeatedly. He call back number is [phone].

## 2022-10-28 NOTE — Telephone Encounter (Signed)
I will call the pt again. She has not been answering.

## 2022-10-28 NOTE — Telephone Encounter (Signed)
Noted  

## 2022-11-11 ENCOUNTER — Telehealth: Payer: Self-pay | Admitting: Cardiology

## 2022-11-11 ENCOUNTER — Telehealth: Payer: Self-pay

## 2022-11-11 MED ORDER — TRULANCE 3 MG PO TABS: 3.0000 mg | ORAL_TABLET | Freq: Every day | ORAL | 3 refills | Status: DC

## 2022-11-11 NOTE — Telephone Encounter (Signed)
RX sent to [facility]

## 2022-11-11 NOTE — Telephone Encounter (Signed)
Pt informed to stop taking met succ to help increase BP. Pt will continue to check BP/P. Med list updated.

## 2022-11-11 NOTE — Telephone Encounter (Signed)
  Per MyChart Scheduling Message:  Pt c/o BP issue: STAT if pt c/o blurred vision, one-sided weakness or slurred speech  1. What are your last 5 BP readings?   2. Are you having any other symptoms (ex. Dizziness, headache, blurred vision, passed out)?   3. What is your BP issue?     98/46 94/45 95/46 $ 96/52 96/53 dizziness headache blurred vision no passing out weak at times.  These blood pressure readings are when I'm at rest.  During the day they seem normal but a little lower that I normally am.  Unsure why it drops low when I'm sitting in the evenings.

## 2022-11-11 NOTE — Telephone Encounter (Signed)
Pt's insurance changed and the cost for her amitiza increased. Pt is wanting to see if her new insurance will cover Trulance. Can this be sent to her pharmacy?

## 2022-11-11 NOTE — Telephone Encounter (Signed)
PA for Trulance was approved from 11/10/2022 through 11/11/2023.

## 2022-11-11 NOTE — Telephone Encounter (Signed)
Pt reported BPs 98/46, 94/45, 95/46, 96/52, 96/53. Pulses in the 21s. She saw PCP 1-2 weeks ago and was told hypotension may be from metoprolol. She reported dizziness,b ut was told it was vertigo. 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 She drinks 4 (16 oz) bottles of Aquafina a day. Urine dark yellow. Encouraged her to drink more water. She is taking meds as prescribed. Please advise on BP.

## 2022-11-11 NOTE — Addendum Note (Signed)
Addended by: Mahala Menghini on: 11/11/2022 11:56 AM   Modules accepted: Orders

## 2022-11-18 ENCOUNTER — Telehealth: Payer: Self-pay | Admitting: Nurse Practitioner

## 2022-11-18 ENCOUNTER — Encounter: Payer: Self-pay | Admitting: Radiology

## 2022-11-18 ENCOUNTER — Telehealth: Payer: Self-pay | Admitting: Cardiology

## 2022-11-18 NOTE — Telephone Encounter (Signed)
   Patient sent MyChart message stating that she is off work and can answer a callback from the nurse now. Please call

## 2022-11-18 NOTE — Telephone Encounter (Signed)
Patient returned call

## 2022-11-18 NOTE — Telephone Encounter (Signed)
Pt calling back about samples. In the meantime, can pt get samples? Please call back

## 2022-11-18 NOTE — Telephone Encounter (Signed)
1 month supply of Rybelsus left up front for patient pick up. Left detailed message on patients voicemail

## 2022-11-18 NOTE — Telephone Encounter (Signed)
Please review

## 2022-11-18 NOTE — Telephone Encounter (Signed)
Called patient to discuss symptoms, no answer. Left message per DPR asking patient to call back.

## 2022-11-18 NOTE — Telephone Encounter (Signed)
  Per MyChart scheduling message:   I am still experiencing drops in blood pressure.  Happens when I'm sitting.  Have stopped the medication they told me to.  Can they review and tell me if I need to do anything else.  Last 5 checks 101/56 heart rate of 57. 102/46 heart rate 59. 126/57 heart rate of 63.  This pressure was after moving some.  105/48 heart rate 58. 105/54 heart rate 58. Have readings as low as 97/53 heart rate 62.   Patient previously spoke to nurse on 11/11/22

## 2022-11-18 NOTE — Telephone Encounter (Signed)
Called pt. She states her blood pressure drops in the evening but goes to normal range when up and about. She states her blood pressure is still dropping in the 90 sbp. Pt reports being very sleepy, headache, blurred vision, dizziness when her blood pressure drops. She takes all her medication in the morning.

## 2022-11-18 NOTE — Telephone Encounter (Signed)
Called pt, no answer. Left message for her to return the call.

## 2022-11-19 ENCOUNTER — Other Ambulatory Visit (HOSPITAL_COMMUNITY): Payer: Self-pay

## 2022-11-19 NOTE — Telephone Encounter (Signed)
Unable to reach pt or leave a message  

## 2022-11-22 ENCOUNTER — Telehealth (INDEPENDENT_AMBULATORY_CARE_PROVIDER_SITE_OTHER): Payer: Commercial Managed Care - HMO | Admitting: Nurse Practitioner

## 2022-11-22 ENCOUNTER — Encounter: Payer: Self-pay | Admitting: Nurse Practitioner

## 2022-11-22 DIAGNOSIS — I959 Hypotension, unspecified: Secondary | ICD-10-CM | POA: Diagnosis not present

## 2022-11-22 MED ORDER — AMLODIPINE BESYLATE 5 MG PO TABS: 5.0000 mg | ORAL_TABLET | Freq: Every day | ORAL | 1 refills | Status: DC

## 2022-11-22 NOTE — Progress Notes (Signed)
Virtual Visit Consent   Kathryn Merritt, you are scheduled for a virtual visit with Mary-Margaret Hassell Done, [place], a [facility] provider, today.     Just as with appointments in the office, your consent must be obtained to participate.  Your consent will be active for this visit and any virtual visit you may have with one of our providers in the next 365 days.     If you have a MyChart account, a copy of this consent can be sent to you electronically.  All virtual visits are billed to your insurance company just like a traditional visit in the office.    As this is a virtual visit, video technology does not allow for your provider to perform a traditional examination.  This may limit your provider's ability to fully assess your condition.  If your provider identifies any concerns that need to be evaluated in person or the need to arrange testing (such as labs, EKG, etc.), we will make arrangements to do so.     Although advances in technology are sophisticated, we cannot ensure that it will always work on either your end or our end.  If the connection with a video visit is poor, the visit may have to be switched to a telephone visit.  With either a video or telephone visit, we are not always able to ensure that we have a secure connection.     I need to obtain your verbal consent now.   Are you willing to proceed with your visit today? YES   Kathryn Merritt has provided verbal consent on 11/22/2022 for a virtual visit (video or telephone).   Mary-Margaret Hassell Done, FNP   Date: 11/22/2022 9:20 AM   Virtual Visit via Video Note   I, Mary-Margaret Hassell Done, connected with Kathryn Merritt ([id number], 09-14-1961) on 11/22/22 at  4:30 PM EST by a video-enabled telemedicine application and verified that I am speaking with the correct person using two identifiers.  Location: Patient: Virtual Visit Location Patient: Home Provider: Virtual Visit Location Provider: Mobile   I discussed the limitations of  evaluation and management by telemedicine and the availability of in person appointments. The patient expressed understanding and agreed to proceed.    History of Present Illness: Kathryn Merritt is a 62 y.o. who identifies as a female who was assigned female at birth, and is being seen today for blood pressure issues.  HPI: Patient says her blood pressure has been dropping even when she is sitting., she spoke with cardiology this morning and they suggested to lower amlodipne to '5mg'$  daily. She wanted to make sure that was ok with me. Blood pressure has been running around 9's systolic.    Review of Systems  Constitutional:  Negative for diaphoresis and weight loss.  Eyes:  Negative for blurred vision, double vision, photophobia and pain.  Respiratory:  Negative for shortness of breath.   Cardiovascular:  Negative for chest pain, palpitations, orthopnea and leg swelling.  Gastrointestinal:  Negative for abdominal pain.  Skin:  Negative for rash.  Neurological:  Negative for dizziness, sensory change, loss of consciousness, weakness and headaches.  Endo/Heme/Allergies:  Negative for polydipsia. Does not bruise/bleed easily.  Psychiatric/Behavioral:  Negative for memory loss. The patient does not have insomnia.   All other systems reviewed and are negative.   Problems:  Patient Active Problem List   Diagnosis Date Noted   Lesion of left ear 06/24/2022   Vitamin D deficiency 01/21/2022   Stage 3a chronic  kidney disease ([place]) 01/21/2022   Esophageal dysphagia 01/13/2022   Urinary tract infection with hematuria 10/23/2021   Subclinical hypothyroidism 10/23/2021   Annual physical exam 09/23/2021   Bronchitis 09/09/2021   Gastroesophageal reflux disease 07/16/2021   OSA (obstructive sleep apnea) 07/14/2021   Establishing care with new doctor, encounter for 06/18/2021   Hypertension 06/18/2021   Hyperlipidemia 06/18/2021   Paroxysmal atrial fibrillation ([place]) 03/18/2021   Obesity, Class  III, BMI 40-49.9 (morbid obesity) ([place]) 03/18/2021   Controlled type 2 diabetes mellitus  03/18/2021    Allergies: No Known Allergies Medications:  Current Outpatient Medications:    acetaminophen (TYLENOL) 500 MG tablet, Take 1,000 mg by mouth every 8 (eight) hours as needed for moderate pain., Disp: , Rfl:    albuterol (VENTOLIN HFA) 108 (90 Base) MCG/ACT inhaler, Inhale 2 puffs into the lungs every 6 (six) hours as needed for wheezing or shortness of breath., Disp: 8.5 g, Rfl: 0   amLODipine (NORVASC) 5 MG tablet, Take 1 tablet (5 mg total) by mouth daily., Disp: 90 tablet, Rfl: 1   blood glucose meter kit and supplies, Dispense based on patient and insurance preference. Use up to four times daily as directed. (FOR ICD-10 E10.9, E11.9)., Disp: 1 each, Rfl: 0   Cholecalciferol (VITAMIN D3) 50 MCG (2000 UT) capsule, Take 1 capsule (2,000 Units total) by mouth daily., Disp: 90 capsule, Rfl: 1   diclofenac Sodium (VOLTAREN) 1 % GEL, Apply 2 g topically 4 (four) times daily., Disp: 150 g, Rfl: 2   ELIQUIS 5 MG TABS tablet, TAKE ONE TABLET BY MOUTH TWICE DAILY, Disp: 60 tablet, Rfl: 5   ENULOSE 10 GM/15ML SOLN, Take by mouth., Disp: , Rfl:    lactulose (CHRONULAC) 10 GM/15ML solution, Take 30 mLs (20 g total) by mouth 2 (two) times daily. For constipation, Disp: 1800 mL, Rfl: 3   lisinopril-hydrochlorothiazide (ZESTORETIC) 20-25 MG tablet, Take 1 tablet by mouth daily., Disp: 60 tablet, Rfl: 2   meclizine (ANTIVERT) 12.5 MG tablet, Take 1 tablet (12.5 mg total) by mouth 3 (three) times daily as needed for dizziness., Disp: 30 tablet, Rfl: 0   omeprazole (PRILOSEC) 40 MG capsule, TAKE ONE CAPSULE BY MOUTH DAILY, Disp: 30 capsule, Rfl: 3   Plecanatide (TRULANCE) 3 MG TABS, Take 1 tablet (3 mg total) by mouth daily., Disp: 90 tablet, Rfl: 3   rosuvastatin (CRESTOR) 10 MG tablet, Take 1 tablet (10 mg total) by mouth daily., Disp: 90 tablet, Rfl: 1   TRUE METRIX BLOOD GLUCOSE TEST test strip,  SMARTSIG:Via Meter 1-4 Times Daily, Disp: , Rfl:    TRUEplus Lancets 30G MISC, USE UP TO FOUR TIMES DAILY AS DIRECTED, Disp: , Rfl:    TRULICITY 4.5 0000000 SOPN, INJECT 4.5 MG SUBCUTANEOUSLY ONCE A WEEK, Disp: 12 mL, Rfl: 0  Observations/Objective: Patient is well-developed, well-nourished in no acute distress.  Resting comfortably  at home.  Head is normocephalic, atraumatic.  No labored breathing.  Speech is clear and coherent with logical content.  Patient is alert and oriented at baseline.    Assessment and Plan:  Kathryn Merritt in today with chief complaint of Hypertension   1. Hypotension, unspecified hypotension type Agree with decreasing amlodipine to '5mg'$  daily Keep diary of blood pressure Keep follow up appointment     Follow Up Instructions: I discussed the assessment and treatment plan with the patient. The patient was provided an opportunity to ask questions and all were answered. The patient agreed with the plan and demonstrated an  understanding of the instructions.  A copy of instructions were sent to the patient via MyChart.  The patient was advised to call back or seek an in-person evaluation if the symptoms worsen or if the condition fails to improve as anticipated.  Time:  I spent 6 minutes with the patient via telehealth technology discussing the above problems/concerns.    Mary-Margaret Hassell Done, FNP

## 2022-11-22 NOTE — Telephone Encounter (Signed)
Returned call to patient who states that her blood pressure is still running low. Patient reports that last night it dropped to 90/46.   Minus Breeding, MD  Dolores Hoose M, RN4 days ago    I don't know if she got the message to stop her beta blocker.  If she did and her BP is remaining low she could reduce the Norvasc to 5 mg daily.     Patient reports that she stopped the Metoprolol already prior. Advised patient to go ahead and reduce amlodipine to '5mg'$  once daily and keep an eye on blood pressure. Advised patient to stay well hydrated and advised patient to change positions slowly. New prescription sent to patient preferred pharmacy. Patient aware and verbalized understanding.

## 2022-11-22 NOTE — Telephone Encounter (Signed)
  Per MyChart scheduling message:   Good morning.  Could you please ask the nurse to call today.  She never called back with an answer last week.  My blood pressure was as low as 90/46. This weekend.

## 2022-11-22 NOTE — Patient Instructions (Signed)
Kathryn Merritt, thank you for joining Kathryn Pretty, FNP for today's virtual visit.  While this provider is not your primary care provider (PCP), if your PCP is located in our provider database this encounter information will be shared with them immediately following your visit.   [facility] account gives you access to today's visit and all your visits, tests, and labs performed at [facility] " click here if you don't have a [facility] account or go to [email or web address]  Consent: (Patient) Kathryn Merritt provided verbal consent for this virtual visit at the beginning of the encounter.  Current Medications:  Current Outpatient Medications:    acetaminophen (TYLENOL) 500 MG tablet, Take 1,000 mg by mouth every 8 (eight) hours as needed for moderate pain., Disp: , Rfl:    albuterol (VENTOLIN HFA) 108 (90 Base) MCG/ACT inhaler, Inhale 2 puffs into the lungs every 6 (six) hours as needed for wheezing or shortness of breath., Disp: 8.5 g, Rfl: 0   amLODipine (NORVASC) 5 MG tablet, Take 1 tablet (5 mg total) by mouth daily., Disp: 90 tablet, Rfl: 1   blood glucose meter kit and supplies, Dispense based on patient and insurance preference. Use up to four times daily as directed. (FOR ICD-10 E10.9, E11.9)., Disp: 1 each, Rfl: 0   Cholecalciferol (VITAMIN D3) 50 MCG (2000 UT) capsule, Take 1 capsule (2,000 Units total) by mouth daily., Disp: 90 capsule, Rfl: 1   diclofenac Sodium (VOLTAREN) 1 % GEL, Apply 2 g topically 4 (four) times daily., Disp: 150 g, Rfl: 2   ELIQUIS 5 MG TABS tablet, TAKE ONE TABLET BY MOUTH TWICE DAILY, Disp: 60 tablet, Rfl: 5   ENULOSE 10 GM/15ML SOLN, Take by mouth., Disp: , Rfl:    lactulose (CHRONULAC) 10 GM/15ML solution, Take 30 mLs (20 g total) by mouth 2 (two) times daily. For constipation, Disp: 1800 mL, Rfl: 3   lisinopril-hydrochlorothiazide (ZESTORETIC) 20-25 MG tablet, Take 1 tablet by mouth daily., Disp: 60 tablet, Rfl:  2   meclizine (ANTIVERT) 12.5 MG tablet, Take 1 tablet (12.5 mg total) by mouth 3 (three) times daily as needed for dizziness., Disp: 30 tablet, Rfl: 0   omeprazole (PRILOSEC) 40 MG capsule, TAKE ONE CAPSULE BY MOUTH DAILY, Disp: 30 capsule, Rfl: 3   Plecanatide (TRULANCE) 3 MG TABS, Take 1 tablet (3 mg total) by mouth daily., Disp: 90 tablet, Rfl: 3   rosuvastatin (CRESTOR) 10 MG tablet, Take 1 tablet (10 mg total) by mouth daily., Disp: 90 tablet, Rfl: 1   TRUE METRIX BLOOD GLUCOSE TEST test strip, SMARTSIG:Via Meter 1-4 Times Daily, Disp: , Rfl:    TRUEplus Lancets 30G MISC, USE UP TO FOUR TIMES DAILY AS DIRECTED, Disp: , Rfl:    TRULICITY 4.5 0000000 SOPN, INJECT 4.5 MG SUBCUTANEOUSLY ONCE A WEEK, Disp: 12 mL, Rfl: 0   Medications ordered in this encounter:  No orders of the defined types were placed in this encounter.    *If you need refills on other medications prior to your next appointment, please contact your pharmacy*  Follow-Up: Call back or seek an in-person evaluation if the symptoms worsen or if the condition fails to improve as anticipated.  [facility] [phone]  Other Instructions Keep diary of blood pressure Keep follow up appointment as scheduled   If you have been instructed to have an in-person evaluation today at a local Urgent Care facility, please use the link below. It will take you to a list  of all of our available [facility], including address, phone number and hours of operation. Please do not delay care.  [facility]  If you or a family member do not have a primary care provider, use the link below to schedule a visit and establish care. When you choose a [facility] primary care physician or advanced practice provider, you gain a long-term partner in health. Find a Primary Care Provider  Learn more about [facility]'s in-office and virtual care options: [facility]

## 2022-11-25 ENCOUNTER — Other Ambulatory Visit (HOSPITAL_COMMUNITY): Payer: Self-pay

## 2022-11-25 NOTE — Telephone Encounter (Signed)
Patient Advocate Encounter  Prior Authorization for Trulicity 4.'5MG'$ /0.5ML pen-injectors has been approved.    PA# [id number] Effective dates: 11/25/22 through 11/25/23

## 2022-11-25 NOTE — Telephone Encounter (Signed)
Pharmacy Patient Advocate Encounter   Received notification that prior authorization for Trulicity 4.'5MG'$ /0.5ML pen-injectors is required/requested.  Per Test Claim: PA required   PA submitted on 11/25/22 to (ins) [employer] via CoverMyMeds [employer] or [facility]) confirmation # [id number] Status is pending

## 2022-11-29 ENCOUNTER — Emergency Department (HOSPITAL_COMMUNITY): Payer: Commercial Managed Care - HMO

## 2022-11-29 ENCOUNTER — Other Ambulatory Visit: Payer: Self-pay | Admitting: Family Medicine

## 2022-11-29 ENCOUNTER — Other Ambulatory Visit: Payer: Self-pay

## 2022-11-29 ENCOUNTER — Emergency Department (HOSPITAL_COMMUNITY)
Admission: EM | Admit: 2022-11-29 | Discharge: 2022-11-29 | Disposition: A | Discharge status: Home / Self Care | Payer: Commercial Managed Care - HMO | Attending: Emergency Medicine | Admitting: Emergency Medicine

## 2022-11-29 ENCOUNTER — Encounter (HOSPITAL_COMMUNITY): Payer: Self-pay | Admitting: *Deleted

## 2022-11-29 DIAGNOSIS — Z79899 Other long term (current) drug therapy: Secondary | ICD-10-CM | POA: Diagnosis not present

## 2022-11-29 DIAGNOSIS — R109 Unspecified abdominal pain: Secondary | ICD-10-CM | POA: Diagnosis present

## 2022-11-29 DIAGNOSIS — R197 Diarrhea, unspecified: Secondary | ICD-10-CM | POA: Diagnosis not present

## 2022-11-29 DIAGNOSIS — Z1152 Encounter for screening for COVID-19: Secondary | ICD-10-CM | POA: Insufficient documentation

## 2022-11-29 DIAGNOSIS — I10 Essential (primary) hypertension: Secondary | ICD-10-CM | POA: Diagnosis not present

## 2022-11-29 DIAGNOSIS — E876 Hypokalemia: Secondary | ICD-10-CM | POA: Insufficient documentation

## 2022-11-29 DIAGNOSIS — E860 Dehydration: Secondary | ICD-10-CM | POA: Diagnosis not present

## 2022-11-29 DIAGNOSIS — K219 Gastro-esophageal reflux disease without esophagitis: Secondary | ICD-10-CM

## 2022-11-29 DIAGNOSIS — R112 Nausea with vomiting, unspecified: Secondary | ICD-10-CM | POA: Diagnosis not present

## 2022-11-29 DIAGNOSIS — J40 Bronchitis, not specified as acute or chronic: Secondary | ICD-10-CM

## 2022-11-29 DIAGNOSIS — E119 Type 2 diabetes mellitus without complications: Secondary | ICD-10-CM | POA: Diagnosis not present

## 2022-11-29 LAB — COMPREHENSIVE METABOLIC PANEL
ALT: 19 U/L (ref 0–44)
AST: 18 U/L (ref 15–41)
Albumin: 3.5 g/dL (ref 3.5–5.0)
Alkaline Phosphatase: 53 U/L (ref 38–126)
Anion gap: 8 (ref 5–15)
BUN: 19 mg/dL (ref 6–20)
CO2: 20 mmol/L — ABNORMAL LOW (ref 22–32)
Calcium: 9 mg/dL (ref 8.9–10.3)
Chloride: 109 mmol/L (ref 98–111)
Creatinine, Ser: 0.97 mg/dL (ref 0.44–1.00)
GFR, Estimated: >60 mL/min (ref >60)
Glucose, Bld: 117 mg/dL — ABNORMAL HIGH (ref 70–99)
Potassium: 3.2 mmol/L — ABNORMAL LOW (ref 3.5–5.1)
Sodium: 137 mmol/L (ref 135–145)
Total Bilirubin: 0.4 mg/dL (ref 0.3–1.2)
Total Protein: 6.7 g/dL (ref 6.5–8.1)

## 2022-11-29 LAB — URINALYSIS, ROUTINE W REFLEX MICROSCOPIC
Bilirubin Urine: NEGATIVE
Glucose, UA: NEGATIVE mg/dL
Hgb urine dipstick: NEGATIVE
Ketones, ur: NEGATIVE mg/dL
Leukocytes,Ua: NEGATIVE
Nitrite: NEGATIVE
Protein, ur: 30 mg/dL — AB
Specific Gravity, Urine: 1.018 (ref 1.005–1.030)
pH: 5 (ref 5.0–8.0)

## 2022-11-29 LAB — C DIFFICILE QUICK SCREEN W PCR REFLEX
C Diff antigen: NEGATIVE
C Diff interpretation: NOT DETECTED
C Diff toxin: NEGATIVE

## 2022-11-29 LAB — LIPASE, BLOOD: Lipase: 40 U/L (ref 11–51)

## 2022-11-29 LAB — RESP PANEL BY RT-PCR (RSV, FLU A&B, COVID)  RVPGX2
Influenza A by PCR: NEGATIVE
Influenza B by PCR: NEGATIVE
Resp Syncytial Virus by PCR: NEGATIVE
SARS Coronavirus 2 by RT PCR: NEGATIVE

## 2022-11-29 LAB — CBC
HCT: 43.3 % (ref 36.0–46.0)
Hemoglobin: 13.9 g/dL (ref 12.0–15.0)
MCH: 29 pg (ref 26.0–34.0)
MCHC: 32.1 g/dL (ref 30.0–36.0)
MCV: 90.4 fL (ref 80.0–100.0)
Platelets: 205 10*3/uL (ref 150–400)
RBC: 4.79 MIL/uL (ref 3.87–5.11)
RDW: 15 % (ref 11.5–15.5)
WBC: 5 10*3/uL (ref 4.0–10.5)
nRBC: 0 % (ref 0.0–0.2)

## 2022-11-29 LAB — POC URINE PREG, ED: Preg Test, Ur: NEGATIVE

## 2022-11-29 LAB — MAGNESIUM: Magnesium: 1.9 mg/dL (ref 1.7–2.4)

## 2022-11-29 MED ORDER — ONDANSETRON 4 MG PO TBDP: 4.0000 mg | ORAL_TABLET | Freq: Three times a day (TID) | ORAL | 0 refills | Status: DC | PRN

## 2022-11-29 MED ORDER — SODIUM CHLORIDE 0.9 % IV BOLUS
1000.0000 mL | Freq: Once | INTRAVENOUS | Status: AC
  Administered 2022-11-29: 1000 mL via INTRAVENOUS

## 2022-11-29 MED ORDER — POTASSIUM CHLORIDE CRYS ER 20 MEQ PO TBCR
40.0000 meq | EXTENDED_RELEASE_TABLET | Freq: Once | ORAL | Status: AC
  Administered 2022-11-29: 40 meq via ORAL
  Filled 2022-11-29: qty 2

## 2022-11-29 MED ORDER — IOHEXOL 300 MG/ML  SOLN
100.0000 mL | Freq: Once | INTRAMUSCULAR | Status: AC | PRN
  Administered 2022-11-29: 100 mL via INTRAVENOUS

## 2022-11-29 NOTE — ED Notes (Signed)
Pt up in room.  Dressed.  States she feels much better  drinking water well   No nausea.  States diarrhea has slowed down

## 2022-11-29 NOTE — ED Provider Notes (Signed)
[facility] Provider Note   CSN: [id number] Arrival date & time: 11/29/22  [id number]     History  Chief Complaint  Patient presents with   Abdominal Pain    Kathryn Merritt is a 62 y.o. female.  Pt is a 62 yo female with pmhx significant for htn, dm, kidney stones, sleep apnea, hld, chronic back pain and afib on Eliquis.  Pt presents to the ED today with diarrhea and nausea and vomiting.  No fever.  Pt has been having episodes where her bp drops when she is sitting.  Cards has adjusted meds which has helped.  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  Pt said she's been sick since Friday, 3/8.  When she eats/drinks, she either vomits or has diarrhea.  She said she had 4 episodes of diarrhea while waiting in the waiting room.  No pain.       Home Medications Prior to Admission medications   Medication Sig Start Date End Date Taking? Authorizing Provider  acetaminophen (TYLENOL) 500 MG tablet Take 1,000 mg by mouth every 8 (eight) hours as needed for moderate pain.   Yes [provider]  albuterol (VENTOLIN HFA) 108 (90 Base) MCG/ACT inhaler Inhale 2 puffs into the lungs every 6 (six) hours as needed for wheezing or shortness of breath. 09/24/22  Yes Ivy Lynn, NP  amLODipine (NORVASC) 5 MG tablet Take 1 tablet (5 mg total) by mouth daily. 11/22/22  Yes Minus Breeding, MD  Cholecalciferol (VITAMIN D3) 50 MCG (2000 UT) capsule Take 1 capsule (2,000 Units total) by mouth daily. 09/27/22  Yes Ivy Lynn, NP  ELIQUIS 5 MG TABS tablet TAKE ONE TABLET BY MOUTH TWICE DAILY 04/26/22  Yes Minus Breeding, MD  lisinopril-hydrochlorothiazide (ZESTORETIC) 20-25 MG tablet Take 1 tablet by mouth daily. 07/19/22  Yes Ivy Lynn, NP  meclizine (ANTIVERT) 12.5 MG tablet Take 1 tablet (12.5 mg total) by mouth 3 (three) times daily as needed for dizziness. 10/11/22  Yes Gwenlyn Perking, FNP  omeprazole (PRILOSEC) 40 MG  capsule TAKE ONE CAPSULE BY MOUTH DAILY 07/19/22  Yes Ivy Lynn, NP  ondansetron (ZOFRAN-ODT) 4 MG disintegrating tablet Take 1 tablet (4 mg total) by mouth every 8 (eight) hours as needed. 11/29/22  Yes Isla Pence, MD  rosuvastatin (CRESTOR) 10 MG tablet Take 1 tablet (10 mg total) by mouth daily. 08/24/22  Yes Ivy Lynn, NP  Semaglutide (RYBELSUS) 3 MG TABS Take 3 mg by mouth daily. Dr. Hanley Seamen samples   Yes [provider]  blood glucose meter kit and supplies Dispense based on patient and insurance preference. Use up to four times daily as directed. (FOR ICD-10 E10.9, E11.9). 10/01/21   Brita Romp, NP  diclofenac Sodium (VOLTAREN) 1 % GEL Apply 2 g topically 4 (four) times daily. Patient not taking: Reported on 11/29/2022 12/26/21   Volney American, PA-C  lactulose [facility]) 10 GM/15ML solution Take 30 mLs (20 g total) by mouth 2 (two) times daily. For constipation Patient not taking: Reported on 11/29/2022 06/22/22   Mahala Menghini, PA-C  Plecanatide (TRULANCE) 3 MG TABS Take 1 tablet (3 mg total) by mouth daily. Patient not taking: Reported on 11/29/2022 11/11/22   Mahala Menghini, PA-C  TRULICITY 4.5 0000000 SOPN INJECT 4.5 MG SUBCUTANEOUSLY ONCE A WEEK Patient not taking: Reported on 11/29/2022 10/15/22   Brita Romp, NP  Allergies    Patient has no known allergies.    Review of Systems   Review of Systems  Gastrointestinal:  Positive for diarrhea, nausea and vomiting.  All other systems reviewed and are negative.   Physical Exam Updated Vital Signs BP 131/76 (BP Location: Right Arm)   Pulse 66   Temp 98 F (36.7 C) (Oral)   Resp 16   Ht '5\' 3"'$  (1.6 m)   Wt 108.9 kg   SpO2 99%   BMI 42.51 kg/m  Physical Exam Vitals and nursing note reviewed.  Constitutional:      Appearance: She is well-developed. She is obese.  HENT:     Head: Normocephalic and atraumatic.     Mouth/Throat:     Mouth: Mucous membranes are dry.     Pharynx:  Oropharynx is clear.  Eyes:     Extraocular Movements: Extraocular movements intact.     Pupils: Pupils are equal, round, and reactive to light.  Cardiovascular:     Rate and Rhythm: Normal rate and regular rhythm.  Pulmonary:     Effort: Pulmonary effort is normal.     Breath sounds: Normal breath sounds.  Abdominal:     General: Abdomen is flat. Bowel sounds are normal.     Palpations: Abdomen is soft.     Tenderness: There is no abdominal tenderness.  Skin:    General: Skin is warm.     Capillary Refill: Capillary refill takes less than 2 seconds.  Neurological:     General: No focal deficit present.     Mental Status: She is alert and oriented to person, place, and time.  Psychiatric:        Mood and Affect: Mood normal.        Behavior: Behavior normal.     ED Results / Procedures / Treatments   Labs (all labs ordered are listed, but only abnormal results are displayed) Labs Reviewed  COMPREHENSIVE METABOLIC PANEL - Abnormal; Notable for the following components:      Result Value   Potassium 3.2 (*)    CO2 20 (*)    Glucose, Bld 117 (*)    All other components within normal limits  URINALYSIS, ROUTINE W REFLEX MICROSCOPIC - Abnormal; Notable for the following components:   APPearance HAZY (*)    Protein, ur 30 (*)    Bacteria, UA MANY (*)    All other components within normal limits  RESP PANEL BY RT-PCR (RSV, FLU A&B, COVID)  [id number]  GASTROINTESTINAL PANEL BY PCR, STOOL (REPLACES STOOL CULTURE)  C DIFFICILE QUICK SCREEN W PCR REFLEX    LIPASE, BLOOD  CBC  MAGNESIUM  POC URINE PREG, ED    EKG None  Radiology CT ABDOMEN PELVIS W CONTRAST  Result Date: 11/29/2022 CLINICAL DATA:  Abdominal pain, acute, nonlocalized. Diarrhea and nausea. EXAM: CT ABDOMEN AND PELVIS WITH CONTRAST TECHNIQUE: Multidetector CT imaging of the abdomen and pelvis was performed using the standard protocol following bolus administration of intravenous contrast. RADIATION DOSE  REDUCTION: This exam was performed according to the departmental dose-optimization program which includes automated exposure control, adjustment of the mA and/or kV according to patient size and/or use of iterative reconstruction technique. CONTRAST:  190m OMNIPAQUE IOHEXOL 300 MG/ML  SOLN COMPARISON:  10/24/2021.  11/13/2014 FINDINGS: Lower chest: Lung bases are clear.  Mild cardiomegaly. Hepatobiliary: Liver parenchyma is normal.  No calcified gallstones. Pancreas: Normal Spleen: Normal Adrenals/Urinary Tract: No change in a 14 mm low-density nodule of the left adrenal gland  when compared to the study of 2016. This indicates a benign adenoma. No follow-up is recommended. There is a nonobstructing 2 mm stone at the upper pole of the right kidney and in the midportion of the right kidney. No passing stone or hydronephrosis. 2 mm nonobstructing stone in the midportion of the left kidney. The bladder is normal. Stomach/Bowel: Stomach is normal. No definite small bowel abnormality. One could question mild mesenteric edema serving the proximal jejunum, with a few slightly prominent nodes in this region. Though not definitely abnormal, this could go along with nonspecific enteritis. No advanced pathology. Normal appendix. Mobile but normal appearing cecum. No evidence of colitis or diverticulitis. Liquid stool present within the left colon. Vascular/Lymphatic: Aortic atherosclerosis. No aneurysm. IVC is normal. No adenopathy. Reproductive: Normal.  No pelvic mass. Other: No free fluid or air. Musculoskeletal: Ordinary mild degenerative changes of the spine. IMPRESSION: 1. Liquid stool in the left colon. No evidence of colitis or diverticulitis. 2. One could question mild mesenteric edema serving the proximal jejunum, with a few slightly prominent nodes in this region. Though not definitely abnormal, this could go along with nonspecific small-bowel enteritis. No advanced pathology. 3. Aortic atherosclerosis. 4. Mild  cardiomegaly. Aortic Atherosclerosis (ICD10-I70.0). Electronically Signed   By: Nelson Chimes M.D.   On: 11/29/2022 11:01    Procedures Procedures    Medications Ordered in ED Medications  sodium chloride 0.9 % bolus 1,000 mL (1,000 mLs Intravenous New Bag/Given 11/29/22 0910)  potassium chloride SA (KLOR-CON M) CR tablet 40 mEq (40 mEq Oral Given 11/29/22 1011)  iohexol (OMNIPAQUE) 300 MG/ML solution 100 mL (100 mLs Intravenous Contrast Given 11/29/22 1035)    ED Course/ Medical Decision Making/ A&P                             Medical Decision Making Amount and/or Complexity of Data Reviewed Labs: ordered. Radiology: ordered.  Risk Prescription drug management.   This patient presents to the ED for concern of diarrhea, n/v, this involves an extensive number of treatment options, and is a complaint that carries with it a high risk of complications and morbidity.  The differential diagnosis includes electrolyte abn, bacterial/viral infection   Co morbidities that complicate the patient evaluation  htn, dm, kidney stones, sleep apnea, hld, chronic back pain and afib on Eliquis   Additional history obtained:  Additional history obtained from epic chart review    Lab Tests:  I Ordered, and personally interpreted labs.  The pertinent results include:  cbc nl, cmp nl other than k low at 3.2 and CO2 low at 20, lip nl, mg nl, covid/flu/rsv neg, ua with protein, many bacteria (no sx, so I will hold off on tx)   Imaging Studies ordered:  I ordered imaging studies including ct abd/pelvis  I independently visualized and interpreted imaging which showed  1. Liquid stool in the left colon. No evidence of colitis or  diverticulitis.  2. One could question mild mesenteric edema serving the proximal  jejunum, with a few slightly prominent nodes in this region. Though  not definitely abnormal, this could go along with nonspecific  small-bowel enteritis. No advanced pathology.  3.  Aortic atherosclerosis.  4. Mild cardiomegaly.    Aortic Atherosclerosis (ICD10-I70.0).   I agree with the radiologist interpretation   Cardiac Monitoring:  The patient was maintained on a cardiac monitor.  I personally viewed and interpreted the cardiac monitored which showed an underlying rhythm of:  nsr   Medicines ordered and prescription drug management:  I ordered medication including IVFs  for dehydration  Reevaluation of the patient after these medicines showed that the patient improved I have reviewed the patients home medicines and have made adjustments as needed   Test Considered:  ct   Critical Interventions:  ivfs  Problem List / ED Course:  Diarrhea: no evidence of colitis or diverticulitis.  Pt gave a stool sample while here.  Results are pending.  She is to check "my chart" for results.  Pt is given a list of foods that help with diarrhea. N/v:  pt feeling better after fluids and zofran.  She is able to tolerate po fluids.  Pt is to return if worse.  F/u with pcp. Hypokalemia:  likely from diarrhea.  K replaced.   Reevaluation:  After the interventions noted above, I reevaluated the patient and found that they have :improved   Social Determinants of Health:  Lives at home   Dispostion:  After consideration of the diagnostic results and the patients response to treatment, I feel that the patent would benefit from discharge with outpatient f/u.          Final Clinical Impression(s) / ED Diagnoses Final diagnoses:  Diarrhea, unspecified type  Hypokalemia  Nausea and vomiting, unspecified vomiting type  Dehydration    Rx / DC Orders ED Discharge Orders          Ordered    ondansetron (ZOFRAN-ODT) 4 MG disintegrating tablet  Every 8 hours PRN        11/29/22 1204              Isla Pence, MD 11/29/22 1206

## 2022-11-29 NOTE — ED Notes (Signed)
Po fluids started

## 2022-11-29 NOTE — ED Triage Notes (Signed)
Pt c/o diarrhea and nausea  Pt states she has been dealing with hypotension and her PCP and cardiologist have been adjusting her medications

## 2022-11-30 ENCOUNTER — Telehealth: Payer: Self-pay

## 2022-11-30 LAB — GASTROINTESTINAL PANEL BY PCR, STOOL (REPLACES STOOL CULTURE)

## 2022-11-30 NOTE — Transitions of Care (Post Inpatient/ED Visit) (Signed)
   11/30/2022  Name: MARYSUE FAIT MRN: [id number] DOB: [birth year]  Today's TOC FU Call Status: Today's TOC FU Call Status:: Successful TOC FU Call Competed TOC FU Call Complete Date: 11/30/22  Transition Care Management Follow-up Telephone Call Date of Discharge: 11/29/22 [facility] Type of Discharge: Emergency Department Reason for ED Visit: Other: (diarrhea) How have you been since you were released from the hospital?: Better Any questions or concerns?: No  Items Reviewed: Did you receive and understand the discharge instructions provided?: Yes Medications obtained and verified?: Yes (Medications Reviewed) Any new allergies since your discharge?: No Dietary orders reviewed?: Yes Do you have support at home?: Yes People in Home: spouse  Home Care and Equipment/Supplies: [place] Ordered?: NA Any new equipment or medical supplies ordered?: NA  Functional Questionnaire: Do you need assistance with bathing/showering or dressing?: No Do you need assistance with meal preparation?: No Do you need assistance with eating?: No Do you have difficulty maintaining continence: No Do you need assistance with getting out of bed/getting out of a chair/moving?: No Do you have difficulty managing or taking your medications?: No  Folllow up appointments reviewed: PCP Follow-up appointment confirmed?: No (patient declined appt) [facility] Follow-up appointment confirmed?: NA Do you need transportation to your follow-up appointment?: No Do you understand care options if your condition(s) worsen?: Yes-patient verbalized understanding    [place], [place] Nurse Health Advisor Direct Dial [phone]

## 2022-11-30 NOTE — ED Notes (Signed)
[place] lab called to state pt had a GI panel performed and it came back positive for norovirus; Dr. Truett Mainland notified and said pt would not need to come back

## 2022-12-03 MED ORDER — TRULICITY 4.5 MG/0.5ML ~~LOC~~ SOAJ: SUBCUTANEOUS | 0 refills | Status: DC

## 2022-12-21 ENCOUNTER — Encounter (HOSPITAL_COMMUNITY): Payer: Self-pay

## 2022-12-21 ENCOUNTER — Emergency Department (HOSPITAL_COMMUNITY)
Admission: EM | Admit: 2022-12-21 | Discharge: 2022-12-21 | Disposition: A | Discharge status: Home / Self Care | Payer: Commercial Managed Care - HMO | Attending: Emergency Medicine | Admitting: Emergency Medicine

## 2022-12-21 ENCOUNTER — Other Ambulatory Visit: Payer: Self-pay

## 2022-12-21 ENCOUNTER — Emergency Department (HOSPITAL_COMMUNITY): Payer: Commercial Managed Care - HMO

## 2022-12-21 ENCOUNTER — Other Ambulatory Visit: Payer: Self-pay | Admitting: Cardiology

## 2022-12-21 DIAGNOSIS — M25552 Pain in left hip: Secondary | ICD-10-CM | POA: Insufficient documentation

## 2022-12-21 DIAGNOSIS — M25531 Pain in right wrist: Secondary | ICD-10-CM | POA: Insufficient documentation

## 2022-12-21 DIAGNOSIS — I129 Hypertensive chronic kidney disease with stage 1 through stage 4 chronic kidney disease, or unspecified chronic kidney disease: Secondary | ICD-10-CM | POA: Diagnosis not present

## 2022-12-21 DIAGNOSIS — E1122 Type 2 diabetes mellitus with diabetic chronic kidney disease: Secondary | ICD-10-CM | POA: Diagnosis not present

## 2022-12-21 DIAGNOSIS — I4891 Unspecified atrial fibrillation: Secondary | ICD-10-CM | POA: Diagnosis not present

## 2022-12-21 DIAGNOSIS — N189 Chronic kidney disease, unspecified: Secondary | ICD-10-CM | POA: Insufficient documentation

## 2022-12-21 DIAGNOSIS — M25512 Pain in left shoulder: Secondary | ICD-10-CM | POA: Diagnosis not present

## 2022-12-21 DIAGNOSIS — Z7901 Long term (current) use of anticoagulants: Secondary | ICD-10-CM | POA: Diagnosis not present

## 2022-12-21 DIAGNOSIS — Z79899 Other long term (current) drug therapy: Secondary | ICD-10-CM | POA: Insufficient documentation

## 2022-12-21 DIAGNOSIS — W19XXXA Unspecified fall, initial encounter: Secondary | ICD-10-CM

## 2022-12-21 DIAGNOSIS — W010XXA Fall on same level from slipping, tripping and stumbling without subsequent striking against object, initial encounter: Secondary | ICD-10-CM | POA: Insufficient documentation

## 2022-12-21 DIAGNOSIS — M79641 Pain in right hand: Secondary | ICD-10-CM | POA: Insufficient documentation

## 2022-12-21 NOTE — ED Triage Notes (Signed)
Patient tripped this morning and is complaining of right hand pain. She is able to wiggle her fingers and extend her wrist. She also complains of left sided back pain

## 2022-12-21 NOTE — Discharge Instructions (Signed)
X-rays did not show any bony injuries.  Use wrist brace as needed for comfort.  Take Tylenol and ibuprofen as needed for pain and soreness.

## 2022-12-21 NOTE — ED Notes (Signed)
Pt verbalized understanding of discharge instructions. Opportunity for questions provided. Work excuse provided.

## 2022-12-21 NOTE — ED Provider Notes (Signed)
[facility] Provider Note   CSN: [id number] Arrival date & time: 12/21/22  [id number]     History  Chief Complaint  Patient presents with   Kathryn Merritt    Kathryn Merritt is a 62 y.o. female.   Fall  Patient presents after fall.  Medical history includes T2DM, atrial fibrillation, HTN, HLD, OSA, GERD, CKD.  This morning, as she was leaving her home for work, she had a mechanical fall.  She describes this as tripping on a brick step outside of her home.  She landed on outstretched hands striking the brakes on the concrete.  She has since had pain in the medial aspect of her right hand, her right wrist, her left shoulder, and her left posterior hip.  She was able to get up off of the ground.  She has been ambulatory.  She did take 1 g of Tylenol prior to arrival.     Home Medications Prior to Admission medications   Medication Sig Start Date End Date Taking? Authorizing Provider  acetaminophen (TYLENOL) 500 MG tablet Take 1,000 mg by mouth every 8 (eight) hours as needed for moderate pain.    [provider]  albuterol (VENTOLIN HFA) 108 (90 Base) MCG/ACT inhaler Inhale 2 puffs into the lungs every 6 (six) hours as needed for wheezing or shortness of breath. 11/30/22   Hassell Done, Mary-Margaret, FNP  amLODipine (NORVASC) 5 MG tablet Take 1 tablet (5 mg total) by mouth daily. 11/22/22   Minus Breeding, MD  blood glucose meter kit and supplies Dispense based on patient and insurance preference. Use up to four times daily as directed. (FOR ICD-10 E10.9, E11.9). 10/01/21   Brita Romp, NP  Cholecalciferol (VITAMIN D3) 50 MCG (2000 UT) capsule Take 1 capsule (2,000 Units total) by mouth daily. 09/27/22   Ivy Lynn, NP  diclofenac Sodium (VOLTAREN) 1 % GEL Apply 2 g topically 4 (four) times daily. 12/26/21   Volney American, PA-C  Dulaglutide (TRULICITY) 4.5 0000000 SOPN INJECT 4.5 MG SUBCUTANEOUSLY ONCE A WEEK 12/03/22   Hassell Done,  Mary-Margaret, FNP  ELIQUIS 5 MG TABS tablet TAKE ONE TABLET BY MOUTH TWICE DAILY 04/26/22   Minus Breeding, MD  lactulose (CHRONULAC) 10 GM/15ML solution Take 30 mLs (20 g total) by mouth 2 (two) times daily. For constipation 06/22/22   Mahala Menghini, PA-C  lisinopril-hydrochlorothiazide (ZESTORETIC) 20-25 MG tablet Take 1 tablet by mouth daily. 07/19/22   Ivy Lynn, NP  meclizine (ANTIVERT) 12.5 MG tablet Take 1 tablet (12.5 mg total) by mouth 3 (three) times daily as needed for dizziness. 10/11/22   Gwenlyn Perking, FNP  omeprazole (PRILOSEC) 40 MG capsule TAKE ONE CAPSULE BY MOUTH DAILY 11/30/22   Hassell Done, Mary-Margaret, FNP  ondansetron (ZOFRAN-ODT) 4 MG disintegrating tablet Take 1 tablet (4 mg total) by mouth every 8 (eight) hours as needed. 11/29/22   Isla Pence, MD  Plecanatide (TRULANCE) 3 MG TABS Take 1 tablet (3 mg total) by mouth daily. 11/11/22   Mahala Menghini, PA-C  rosuvastatin (CRESTOR) 10 MG tablet Take 1 tablet (10 mg total) by mouth daily. 08/24/22   Ivy Lynn, NP  Semaglutide (RYBELSUS) 3 MG TABS Take 3 mg by mouth daily. Dr. Hanley Seamen samples    [provider]      Allergies    Patient has no known allergies.    Review of Systems   Review of Systems  Musculoskeletal:  Positive for arthralgias.  All other  systems reviewed and are negative.   Physical Exam Updated Vital Signs BP (!) 156/85 (BP Location: Left Arm)   Pulse 60   Temp 97.9 F (36.6 C) (Oral)   Resp 18   Ht 5\' 3"  (1.6 m)   Wt 108.9 kg   SpO2 100%   BMI 42.51 kg/m  Physical Exam Vitals and nursing note reviewed.  Constitutional:      General: She is not in acute distress.    Appearance: Normal appearance. She is well-developed. She is not ill-appearing, toxic-appearing or diaphoretic.  HENT:     Head: Normocephalic and atraumatic.     Right Ear: External ear normal.     Left Ear: External ear normal.     Nose: Nose normal.     Mouth/Throat:     Mouth: Mucous membranes  are moist.  Eyes:     Extraocular Movements: Extraocular movements intact.     Conjunctiva/sclera: Conjunctivae normal.  Cardiovascular:     Rate and Rhythm: Normal rate and regular rhythm.  Pulmonary:     Effort: Pulmonary effort is normal. No respiratory distress.  Chest:     Chest wall: No tenderness.  Abdominal:     Palpations: Abdomen is soft.     Tenderness: There is no abdominal tenderness.  Musculoskeletal:        General: Tenderness present. No swelling or deformity.     Cervical back: Normal range of motion and neck supple.     Right lower leg: No edema.     Left lower leg: No edema.  Skin:    General: Skin is warm and dry.     Coloration: Skin is not jaundiced or pale.  Neurological:     General: No focal deficit present.     Mental Status: She is alert and oriented to person, place, and time.     Cranial Nerves: No cranial nerve deficit.     Sensory: No sensory deficit.     Motor: No weakness.     Coordination: Coordination normal.  Psychiatric:        Mood and Affect: Mood normal.        Behavior: Behavior normal.        Thought Content: Thought content normal.        Judgment: Judgment normal.     ED Results / Procedures / Treatments   Labs (all labs ordered are listed, but only abnormal results are displayed) Labs Reviewed - No data to display  EKG None  Radiology DG Chest 2 View  Result Date: 12/21/2022 CLINICAL DATA:  Trauma, fall EXAM: CHEST - 2 VIEW COMPARISON:  07/19/2022 FINDINGS: The heart size and mediastinal contours are within normal limits. Both lungs are clear. The visualized skeletal structures are unremarkable. IMPRESSION: No active cardiopulmonary disease. Electronically Signed   By: Elmer Picker M.D.   On: 12/21/2022 09:50   DG Hip Unilat W or Wo Pelvis 2-3 Views Left  Result Date: 12/21/2022 CLINICAL DATA:  Trauma, fall EXAM: DG HIP (WITH OR WITHOUT PELVIS) 2-3V LEFT COMPARISON:  None Available. FINDINGS: No recent fracture or  dislocation is seen. Degenerative changes are noted in visualized lower lumbar spine. IMPRESSION: No recent fracture or dislocation is seen in pelvis and left hip. Lumbar spondylosis. Electronically Signed   By: Elmer Picker M.D.   On: 12/21/2022 09:47   DG Hand Complete Right  Result Date: 12/21/2022 CLINICAL DATA:  Trauma, fall EXAM: RIGHT HAND - COMPLETE 3+ VIEW COMPARISON:  None Available. FINDINGS: No  fracture or dislocation is seen. Degenerative changes are noted in first carpometacarpal joint. Minimal bony spurs are also noted in multiple interphalangeal joints and metacarpophalangeal joints. IMPRESSION: No recent fracture or dislocation is seen in right hand. Degenerative changes are noted in multiple joints as described in the body of the report. Electronically Signed   By: Elmer Picker M.D.   On: 12/21/2022 09:46   DG Wrist Complete Right  Result Date: 12/21/2022 CLINICAL DATA:  Trauma, fall EXAM: RIGHT WRIST - COMPLETE 3+ VIEW COMPARISON:  None Available. FINDINGS: No recent displaced fracture or dislocation is seen. Degenerative changes are noted with bony spurs in first carpometacarpal joint. IMPRESSION: No recent fracture or dislocation is seen in right wrist. Degenerative changes are noted in first carpometacarpal joint. Electronically Signed   By: Elmer Picker M.D.   On: 12/21/2022 09:44   DG Shoulder Left  Result Date: 12/21/2022 CLINICAL DATA:  Trauma, fall EXAM: LEFT SHOULDER - 2+ VIEW COMPARISON:  None Available. FINDINGS: No fracture or dislocation is seen. Degenerative changes with bony spurs are noted in the left [facility] joint. IMPRESSION: No recent fracture or dislocation is seen. Degenerative changes with bony spurs are noted in left AC joint. Electronically Signed   By: Elmer Picker M.D.   On: 12/21/2022 09:43    Procedures Procedures    Medications Ordered in ED Medications - No data to display  ED Course/ Medical Decision Making/ A&P                              Medical Decision Making Amount and/or Complexity of Data Reviewed Radiology: ordered.   Patient presents after a mechanical fall.  This occurred this morning outside of her home.  She did not strike her head.  She did land on outstretched hands and has had pain to the medial aspect of her right hand as well as her right wrist.  No deformities are noted on exam.  Range of motion is intact.  She does have some bruising to right thenar eminence.  Thumb apposition is intact.  Left shoulder range of motion is also intact and patient is able to abduct greater than 90 degrees.  Patient declined any pain medicine.  X-ray imaging studies were ordered.  Results showed no acute findings.  Patient was given wrist brace for comfort.  She was discharged in good condition.        Final Clinical Impression(s) / ED Diagnoses Final diagnoses:  Fall, initial encounter    Rx / DC Orders ED Discharge Orders     None         Godfrey Pick, MD 12/21/22 1039

## 2022-12-22 ENCOUNTER — Telehealth: Payer: Self-pay

## 2022-12-22 NOTE — Transitions of Care (Post Inpatient/ED Visit) (Signed)
   12/22/2022  Name: Kathryn Merritt MRN: [id number] DOB: 08-22-1961  Today's TOC FU Call Status: Today's TOC FU Call Status:: Successful TOC FU Call Competed TOC FU Call Complete Date: 12/22/22  Transition Care Management Follow-up Telephone Call Date of Discharge: 12/21/22 [facility] Type of Discharge: Emergency Department Reason for ED Visit: Other: (fall)  Items Reviewed:    Home Care and Equipment/Supplies:    Functional Questionnaire:    Follow up appointments reviewed:   Patient has already been seen   [place], [place] Direct Dial [phone]

## 2022-12-23 ENCOUNTER — Encounter: Payer: Self-pay | Admitting: Nurse Practitioner

## 2022-12-23 ENCOUNTER — Ambulatory Visit (INDEPENDENT_AMBULATORY_CARE_PROVIDER_SITE_OTHER): Payer: Commercial Managed Care - HMO | Admitting: Nurse Practitioner

## 2022-12-23 VITALS — BP 138/92 | HR 60 | Temp 97.3°F | Resp 20 | Ht 63.0 in | Wt 241.0 lb

## 2022-12-23 DIAGNOSIS — E119 Type 2 diabetes mellitus without complications: Secondary | ICD-10-CM

## 2022-12-23 DIAGNOSIS — I10 Essential (primary) hypertension: Secondary | ICD-10-CM | POA: Diagnosis not present

## 2022-12-23 DIAGNOSIS — I480 Paroxysmal atrial fibrillation: Secondary | ICD-10-CM | POA: Diagnosis not present

## 2022-12-23 DIAGNOSIS — E66813 Obesity, class 3: Secondary | ICD-10-CM

## 2022-12-23 DIAGNOSIS — E785 Hyperlipidemia, unspecified: Secondary | ICD-10-CM

## 2022-12-23 DIAGNOSIS — Z0000 Encounter for general adult medical examination without abnormal findings: Secondary | ICD-10-CM

## 2022-12-23 DIAGNOSIS — M25512 Pain in left shoulder: Secondary | ICD-10-CM | POA: Diagnosis not present

## 2022-12-23 DIAGNOSIS — N1831 Chronic kidney disease, stage 3a: Secondary | ICD-10-CM

## 2022-12-23 DIAGNOSIS — E038 Other specified hypothyroidism: Secondary | ICD-10-CM

## 2022-12-23 DIAGNOSIS — E559 Vitamin D deficiency, unspecified: Secondary | ICD-10-CM

## 2022-12-23 DIAGNOSIS — G4733 Obstructive sleep apnea (adult) (pediatric): Secondary | ICD-10-CM | POA: Diagnosis not present

## 2022-12-23 DIAGNOSIS — R1319 Other dysphagia: Secondary | ICD-10-CM

## 2022-12-23 DIAGNOSIS — M545 Low back pain, unspecified: Secondary | ICD-10-CM | POA: Diagnosis not present

## 2022-12-23 DIAGNOSIS — M25511 Pain in right shoulder: Secondary | ICD-10-CM

## 2022-12-23 DIAGNOSIS — E1169 Type 2 diabetes mellitus with other specified complication: Secondary | ICD-10-CM

## 2022-12-23 DIAGNOSIS — K219 Gastro-esophageal reflux disease without esophagitis: Secondary | ICD-10-CM

## 2022-12-23 LAB — BAYER DCA HB A1C WAIVED: HB A1C (BAYER DCA - WAIVED): 6.6 % — ABNORMAL HIGH (ref 4.8–5.6)

## 2022-12-23 LAB — LIPID PANEL

## 2022-12-23 MED ORDER — APIXABAN 5 MG PO TABS
5.0000 mg | ORAL_TABLET | Freq: Two times a day (BID) | ORAL | 1 refills | Status: DC
Start: 2022-12-23 — End: 2023-09-19

## 2022-12-23 MED ORDER — ROSUVASTATIN CALCIUM 10 MG PO TABS: 10.0000 mg | ORAL_TABLET | Freq: Every day | ORAL | 1 refills | Status: DC

## 2022-12-23 MED ORDER — TRULANCE 3 MG PO TABS: 3.0000 mg | ORAL_TABLET | Freq: Every day | ORAL | 3 refills | Status: DC

## 2022-12-23 MED ORDER — KETOROLAC TROMETHAMINE 60 MG/2ML IM SOLN
60.0000 mg | Freq: Once | INTRAMUSCULAR | Status: AC
  Administered 2022-12-23: 60 mg via INTRAMUSCULAR

## 2022-12-23 MED ORDER — OMEPRAZOLE 40 MG PO CPDR: 40.0000 mg | DELAYED_RELEASE_CAPSULE | Freq: Every day | ORAL | 0 refills | Status: DC

## 2022-12-23 MED ORDER — LISINOPRIL-HYDROCHLOROTHIAZIDE 20-25 MG PO TABS
1.0000 | ORAL_TABLET | Freq: Every day | ORAL | 1 refills | Status: DC
Start: 2022-12-23 — End: 2023-02-28

## 2022-12-23 MED ORDER — METHYLPREDNISOLONE ACETATE 80 MG/ML IJ SUSP
80.0000 mg | Freq: Once | INTRAMUSCULAR | Status: AC
  Administered 2022-12-23: 80 mg via INTRAMUSCULAR

## 2022-12-23 MED ORDER — TRULICITY 4.5 MG/0.5ML ~~LOC~~ SOAJ: SUBCUTANEOUS | 0 refills | Status: DC

## 2022-12-23 NOTE — Patient Instructions (Signed)
Shoulder Pain Many things can cause shoulder pain, including: An injury. Moving the shoulder in the same way again and again (overuse). Joint pain (arthritis). Pain can come from: Swelling and irritation (inflammation) of any part of the shoulder. An injury to: The shoulder joint. Tissues that connect muscle to bone (tendons). Tissues that connect bones to each other (ligaments). Bones. Follow these instructions at home: Watch for changes in your symptoms. Let your doctor know about them. Follow these instructions to help with your pain. If you have a sling that can be taken off: Wear the sling as told by your doctor. Take it off only as told by your doctor. Check the skin around the sling every day. Tell your doctor if you see problems. Loosen the sling if your fingers: Tingle. Become numb. Become cold. Keep the sling clean. If the sling is not waterproof: Do not let it get wet. Take the sling off when you shower or bathe. Managing pain, stiffness, and swelling  If told, put ice on the painful area. Put ice in a plastic bag. Place a towel between your skin and the bag. Leave the ice on for 20 minutes, 2-3 times a day. Stop putting ice on if it does not help with the pain. If your skin turns bright red, take off the ice right away to prevent skin damage. The risk of damage is higher if you cannot feel pain, heat, or cold. Squeeze a soft ball or a foam pad as much as possible. This prevents swelling in the shoulder. It also helps to strengthen the arm. General instructions Take over-the-counter and prescription medicines only as told by your doctor. Keep all follow-up visits. This will help you avoid any type of permanent shoulder problems. Contact a doctor if: Your pain gets worse. Medicine does not help your pain. You have new pain in your arm, hand, or fingers. You loosen your sling and your arm, hand, or fingers: Tingle. Are numb. Are swollen. Get help right away  if: Your arm, hand, or fingers turn white or blue. This information is not intended to replace advice given to you by your health care provider. Make sure you discuss any questions you have with your health care provider. Document Revised: 04/09/2022 Document Reviewed: 04/09/2022 Elsevier Patient Education  2023 Elsevier Inc.  

## 2022-12-23 NOTE — Progress Notes (Signed)
Subjective:    Patient ID: Kathryn Merritt, female    DOB: Dec 23, 1960, 62 y.o.   MRN: [id number]   Chief Complaint: medical management of chronic issues     HPI:  Kathryn Merritt is a 62 y.o. who identifies as a female who was assigned female at birth.   Social history: Lives with: by her husband Work history: [employer] in today for follow up of the following chronic medical issues:  1. Hypertension, unspecified type No c/o chest pain, sob or headache. Does not check blood pressure at home. BP Readings from Last 3 Encounters:  12/21/22 128/71  11/29/22 129/80  10/28/22 128/67     2. Hyperlipidemia associated with type 2 diabetes mellitus Does not watch diet and does no dedicated exercise. Lab Results  Component Value Date   CHOL 130 09/24/2022   HDL 50 09/24/2022   LDLCALC 61 09/24/2022   TRIG 103 09/24/2022   CHOLHDL 2.6 09/24/2022  The 10-year ASCVD risk score (Arnett DK, et al., 2019) is: 7.5%    3. Paroxysmal atrial fibrillation No heart racing or palpitations. Is on eliquis daily with no bleeding issues  4. OSA (obstructive sleep apnea) Does not wear her cpap machine. Says she feels some what rested in the mornings.  5. Esophageal dysphagia No issues recently  6. Gastroesophageal reflux disease, unspecified whether esophagitis present Is on omperazole and is doing well.  7. Type 2 diabetes mellitus without complication, without long-term current use of insulin Fasting blood sugars are running around over 170's. Deneies any low blood sugars. Lab Results  Component Value Date   HGBA1C 7.1 (H) 09/24/2022     8. Subclinical hypothyroidism No issues that aware of. Lab Results  Component Value Date   TSH 3.690 10/23/2021     9. Stage 3a chronic kidney disease Lab Results  Component Value Date   CREATININE 0.97 11/29/2022     10. Vitamin D deficiency Is on daily vitamin d supplement  11. Obesity, Class III, BMI  40-49.9 (morbid obesity) No recent weight changes Wt Readings from Last 3 Encounters:  12/23/22 241 lb (109.3 kg)  12/21/22 240 lb (108.9 kg)  11/29/22 240 lb (108.9 kg)   BMI Readings from Last 3 Encounters:  12/23/22 42.69 kg/m  12/21/22 42.51 kg/m  11/29/22 42.51 kg/m     New complaints: Patient tripped in her yard Tuesday morning. Went to the ED. Nothing broken. Is still sore all over. Rates pain in back and shouldere 5-6/10. Shoulders hurt worse then her back does.  No Known Allergies Outpatient Encounter Medications as of 12/23/2022  Medication Sig   acetaminophen (TYLENOL) 500 MG tablet Take 1,000 mg by mouth every 8 (eight) hours as needed for moderate pain.   albuterol (VENTOLIN HFA) 108 (90 Base) MCG/ACT inhaler Inhale 2 puffs into the lungs every 6 (six) hours as needed for wheezing or shortness of breath.   amLODipine (NORVASC) 5 MG tablet Take 1 tablet (5 mg total) by mouth daily.   blood glucose meter kit and supplies Dispense based on patient and insurance preference. Use up to four times daily as directed. (FOR ICD-10 E10.9, E11.9).   Cholecalciferol (VITAMIN D3) 50 MCG (2000 UT) capsule Take 1 capsule (2,000 Units total) by mouth daily.   diclofenac Sodium (VOLTAREN) 1 % GEL Apply 2 g topically 4 (four) times daily.   Dulaglutide (TRULICITY) 4.5 0000000 SOPN INJECT 4.5 MG SUBCUTANEOUSLY ONCE A WEEK   ELIQUIS 5 MG TABS  tablet TAKE ONE TABLET BY MOUTH TWICE DAILY   lactulose (CHRONULAC) 10 GM/15ML solution Take 30 mLs (20 g total) by mouth 2 (two) times daily. For constipation   lisinopril-hydrochlorothiazide (ZESTORETIC) 20-25 MG tablet TAKE ONE TABLET BY MOUTH DAILY   meclizine (ANTIVERT) 12.5 MG tablet Take 1 tablet (12.5 mg total) by mouth 3 (three) times daily as needed for dizziness.   omeprazole (PRILOSEC) 40 MG capsule TAKE ONE CAPSULE BY MOUTH DAILY   ondansetron (ZOFRAN-ODT) 4 MG disintegrating tablet Take 1 tablet (4 mg total) by mouth every 8 (eight) hours  as needed.   Plecanatide (TRULANCE) 3 MG TABS Take 1 tablet (3 mg total) by mouth daily.   rosuvastatin (CRESTOR) 10 MG tablet Take 1 tablet (10 mg total) by mouth daily.   Semaglutide (RYBELSUS) 3 MG TABS Take 3 mg by mouth daily. Dr. Hanley Seamen samples   No facility-administered encounter medications on file as of 12/23/2022.    Past Surgical History:  Procedure Laterality Date   BALLOON DILATION N/A 02/22/2022   Procedure: BALLOON DILATION;  Surgeon: Eloise Harman, DO;  Location: [facility];  Service: Endoscopy;  Laterality: N/A;   BIOPSY  02/22/2022   Procedure: BIOPSY;  Surgeon: Eloise Harman, DO;  Location: [facility];  Service: Endoscopy;;   CARDIOVERSION N/A 03/18/2021   Procedure: CARDIOVERSION;  Surgeon: Satira Sark, MD;  Location: [facility];  Service: Cardiovascular;  Laterality: N/A;   CESAREAN SECTION     times 2   COLONOSCOPY WITH PROPOFOL N/A 02/22/2022   Procedure: COLONOSCOPY WITH PROPOFOL;  Surgeon: Eloise Harman, DO;  Location: [facility];  Service: Endoscopy;  Laterality: N/A;  7:30am   CORONARY ANGIOGRAPHY N/A 06/09/2022   Procedure: CORONARY ANGIOGRAPHY;  Surgeon: Early Osmond, MD;  Location: [facility];  Service: Cardiovascular;  Laterality: N/A;   ESOPHAGOGASTRODUODENOSCOPY (EGD) WITH PROPOFOL N/A 02/22/2022   Procedure: ESOPHAGOGASTRODUODENOSCOPY (EGD) WITH PROPOFOL;  Surgeon: Eloise Harman, DO;  Location: [facility];  Service: Endoscopy;  Laterality: N/A;   INCISION AND DRAINAGE ABSCESS Right    axilla   LITHOTRIPSY     POLYPECTOMY  02/22/2022   Procedure: POLYPECTOMY;  Surgeon: Eloise Harman, DO;  Location: [facility];  Service: Endoscopy;;    Family History  Problem Relation Age of Onset   Hypertension Mother    Cancer Father    Hypertension Father    Cancer Brother    Heart disease Paternal Grandmother    Breast cancer Neg Hx    Colon cancer Neg Hx       Controlled substance contract: n/a     Review of  Systems  Constitutional:  Negative for diaphoresis.  Eyes:  Negative for pain.  Respiratory:  Negative for shortness of breath.   Cardiovascular:  Negative for chest pain, palpitations and leg swelling.  Gastrointestinal:  Negative for abdominal pain.  Endocrine: Negative for polydipsia.  Musculoskeletal:  Positive for back pain.  Skin:  Negative for rash.  Neurological:  Negative for dizziness, weakness and headaches.  Hematological:  Does not bruise/bleed easily.  All other systems reviewed and are negative.      Objective:   Physical Exam Vitals and nursing note reviewed.  Constitutional:      General: She is not in acute distress.    Appearance: Normal appearance. She is well-developed.  HENT:     Head: Normocephalic.     Right Ear: Tympanic membrane normal.     Left Ear: Tympanic  membrane normal.     Nose: Nose normal.     Mouth/Throat:     Mouth: Mucous membranes are moist.  Eyes:     Pupils: Pupils are equal, round, and reactive to light.  Neck:     Vascular: No carotid bruit or JVD.  Cardiovascular:     Rate and Rhythm: Normal rate and regular rhythm.     Heart sounds: Normal heart sounds.  Pulmonary:     Effort: Pulmonary effort is normal. No respiratory distress.     Breath sounds: Normal breath sounds. No wheezing or rales.  Chest:     Chest wall: No tenderness.  Abdominal:     General: Bowel sounds are normal. There is no distension or abdominal bruit.     Palpations: Abdomen is soft. There is no hepatomegaly, splenomegaly, mass or pulsatile mass.     Tenderness: There is no abdominal tenderness.  Musculoskeletal:        General: Normal range of motion.     Cervical back: Normal range of motion and neck supple.     Comments: Rises slowly from sitting to standing 'FROM of bil shoulders with soreness reported Pain in lower back with standing (-) SLR bil.  Lymphadenopathy:     Cervical: No cervical adenopathy.  Skin:    General: Skin is warm and dry.   Neurological:     Mental Status: She is alert and oriented to person, place, and time.     Deep Tendon Reflexes: Reflexes are normal and symmetric.  Psychiatric:        Behavior: Behavior normal.        Thought Content: Thought content normal.        Judgment: Judgment normal.    BP (!) 138/92   Pulse 60   Temp (!) 97.3 F (36.3 C) (Temporal)   Resp 20   Ht 5\' 3"  (1.6 m)   Wt 241 lb (109.3 kg)   SpO2 98%   BMI 42.69 kg/m   Hgba1c 6.6%      Assessment & Plan:   Kathryn Merritt comes in today with chief complaint of Medical Management of Chronic Issues (Had fall this week. Would like a shot)   Diagnosis and orders addressed:  1. Hypertension, unspecified type Low sodium diet - CBC with Differential/Platelet - CMP14+EGFR - lisinopril-hydrochlorothiazide (ZESTORETIC) 20-25 MG tablet; Take 1 tablet by mouth daily.  Dispense: 30 tablet; Refill: 1  2. Hyperlipidemia associated with type 2 diabetes mellitus Low fat diet - Lipid panel - rosuvastatin (CRESTOR) 10 MG tablet; Take 1 tablet (10 mg total) by mouth daily.  Dispense: 90 tablet; Refill: 1  3. Paroxysmal atrial fibrillation Avoid caffeine Continue eliquis- watch for any bleeding issues - apixaban (ELIQUIS) 5 MG TABS tablet; Take 1 tablet (5 mg total) by mouth 2 (two) times daily.  Dispense: 180 tablet; Refill: 1  4. OSA (obstructive sleep apnea) Encouraged o ear CPAP  5. Esophageal dysphagia Chew food well  6. Gastroesophageal reflux disease, unspecified whether esophagitis present Avoid spicy foods Do not eat 2 hours prior to bedtime  - omeprazole (PRILOSEC) 40 MG capsule; Take 1 capsule (40 mg total) by mouth daily.  Dispense: 30 capsule; Refill: 0  7. Type 2 diabetes mellitus without complication, without long-term current use of insulin Continue to watch carbs in diet Is on rybellisus in place of trulicity- due  to it being on back prder - Bayer DCA Hb A1c Waived - Dulaglutide (TRULICITY) 4.5  0000000 SOPN; INJECT 4.5  MG SUBCUTANEOUSLY ONCE A WEEK  Dispense: 12 mL; Refill: 0  8. Subclinical hypothyroidism Labs pending  9. Stage 3a chronic kidney disease .labs pending  10. Vitamin D deficiency Continue vitamin d supplement  11. Obesity, Class III, BMI 40-49.9 (morbid obesity) Discussed diet and exercise for person with BMI >25 Will recheck weight in 3-6 months   12. Annual physical exam 13. Acute pain of both shoulders Moist heat rest - methylPREDNISolone acetate (DEPO-MEDROL) injection 80 mg - ketorolac (TORADOL) injection 60 mg  14. Acute bilateral low back pain without sciatica Moist heat  rest - methylPREDNISolone acetate (DEPO-MEDROL) injection 80 mg - ketorolac (TORADOL) injection 60 mg   Labs pending Health Maintenance reviewed Diet and exercise encouraged  Follow up plan: 3 months   Mary-Margaret Hassell Done, FNP

## 2022-12-24 LAB — CBC WITH DIFFERENTIAL/PLATELET
Basophils Absolute: 0 10*3/uL (ref 0.0–0.2)
Basos: 1 %
EOS (ABSOLUTE): 0.1 10*3/uL (ref 0.0–0.4)
Eos: 2 %
Hematocrit: 39.9 % (ref 34.0–46.6)
Hemoglobin: 12.7 g/dL (ref 11.1–15.9)
Immature Grans (Abs): 0 10*3/uL (ref 0.0–0.1)
Immature Granulocytes: 0 %
Lymphocytes Absolute: 2.3 10*3/uL (ref 0.7–3.1)
Lymphs: 40 %
MCH: 29.1 pg (ref 26.6–33.0)
MCHC: 31.8 g/dL (ref 31.5–35.7)
MCV: 91 fL (ref 79–97)
Monocytes Absolute: 0.4 10*3/uL (ref 0.1–0.9)
Monocytes: 7 %
Neutrophils Absolute: 2.9 10*3/uL (ref 1.4–7.0)
Neutrophils: 50 %
Platelets: 202 10*3/uL (ref 150–450)
RBC: 4.37 x10E6/uL (ref 3.77–5.28)
RDW: 13.7 % (ref 11.7–15.4)
WBC: 5.8 10*3/uL (ref 3.4–10.8)

## 2022-12-24 LAB — CMP14+EGFR
ALT: 15 IU/L (ref 0–32)
AST: 15 IU/L (ref 0–40)
Albumin/Globulin Ratio: 1.8 (ref 1.2–2.2)
Albumin: 3.9 g/dL (ref 3.8–4.9)
Alkaline Phosphatase: 74 IU/L (ref 44–121)
BUN/Creatinine Ratio: 20 (ref 12–28)
BUN: 20 mg/dL (ref 8–27)
Bilirubin Total: 0.3 mg/dL (ref 0.0–1.2)
CO2: 20 mmol/L (ref 20–29)
Calcium: 10.1 mg/dL (ref 8.7–10.3)
Chloride: 108 mmol/L — ABNORMAL HIGH (ref 96–106)
Creatinine, Ser: 1 mg/dL (ref 0.57–1.00)
Globulin, Total: 2.2 g/dL (ref 1.5–4.5)
Glucose: 229 mg/dL — ABNORMAL HIGH (ref 70–99)
Potassium: 3.6 mmol/L (ref 3.5–5.2)
Sodium: 142 mmol/L (ref 134–144)
Total Protein: 6.1 g/dL (ref 6.0–8.5)
eGFR: 64 mL/min/{1.73_m2} (ref >59)

## 2022-12-24 LAB — LIPID PANEL
Chol/HDL Ratio: 2.6 ratio (ref 0.0–4.4)
Cholesterol, Total: 131 mg/dL (ref 100–199)
HDL: 51 mg/dL (ref >39)
LDL Chol Calc (NIH): 58 mg/dL (ref 0–99)
Triglycerides: 121 mg/dL (ref 0–149)
VLDL Cholesterol Cal: 22 mg/dL (ref 5–40)

## 2022-12-27 ENCOUNTER — Other Ambulatory Visit: Payer: Self-pay | Admitting: Nurse Practitioner

## 2022-12-27 DIAGNOSIS — J40 Bronchitis, not specified as acute or chronic: Secondary | ICD-10-CM

## 2022-12-29 ENCOUNTER — Ambulatory Visit: Payer: Commercial Managed Care - HMO | Admitting: Nurse Practitioner

## 2023-01-25 NOTE — Telephone Encounter (Signed)
Please give patient rybellsus samples

## 2023-01-26 IMAGING — DX DG CHEST 1V PORT
1 series · 1 of 1 positions shown · non-contrast
Comparison: 02/22/2020.

CLINICAL DATA: Shortness of breath.

EXAM:
PORTABLE CHEST 1 VIEW

[chest ap]
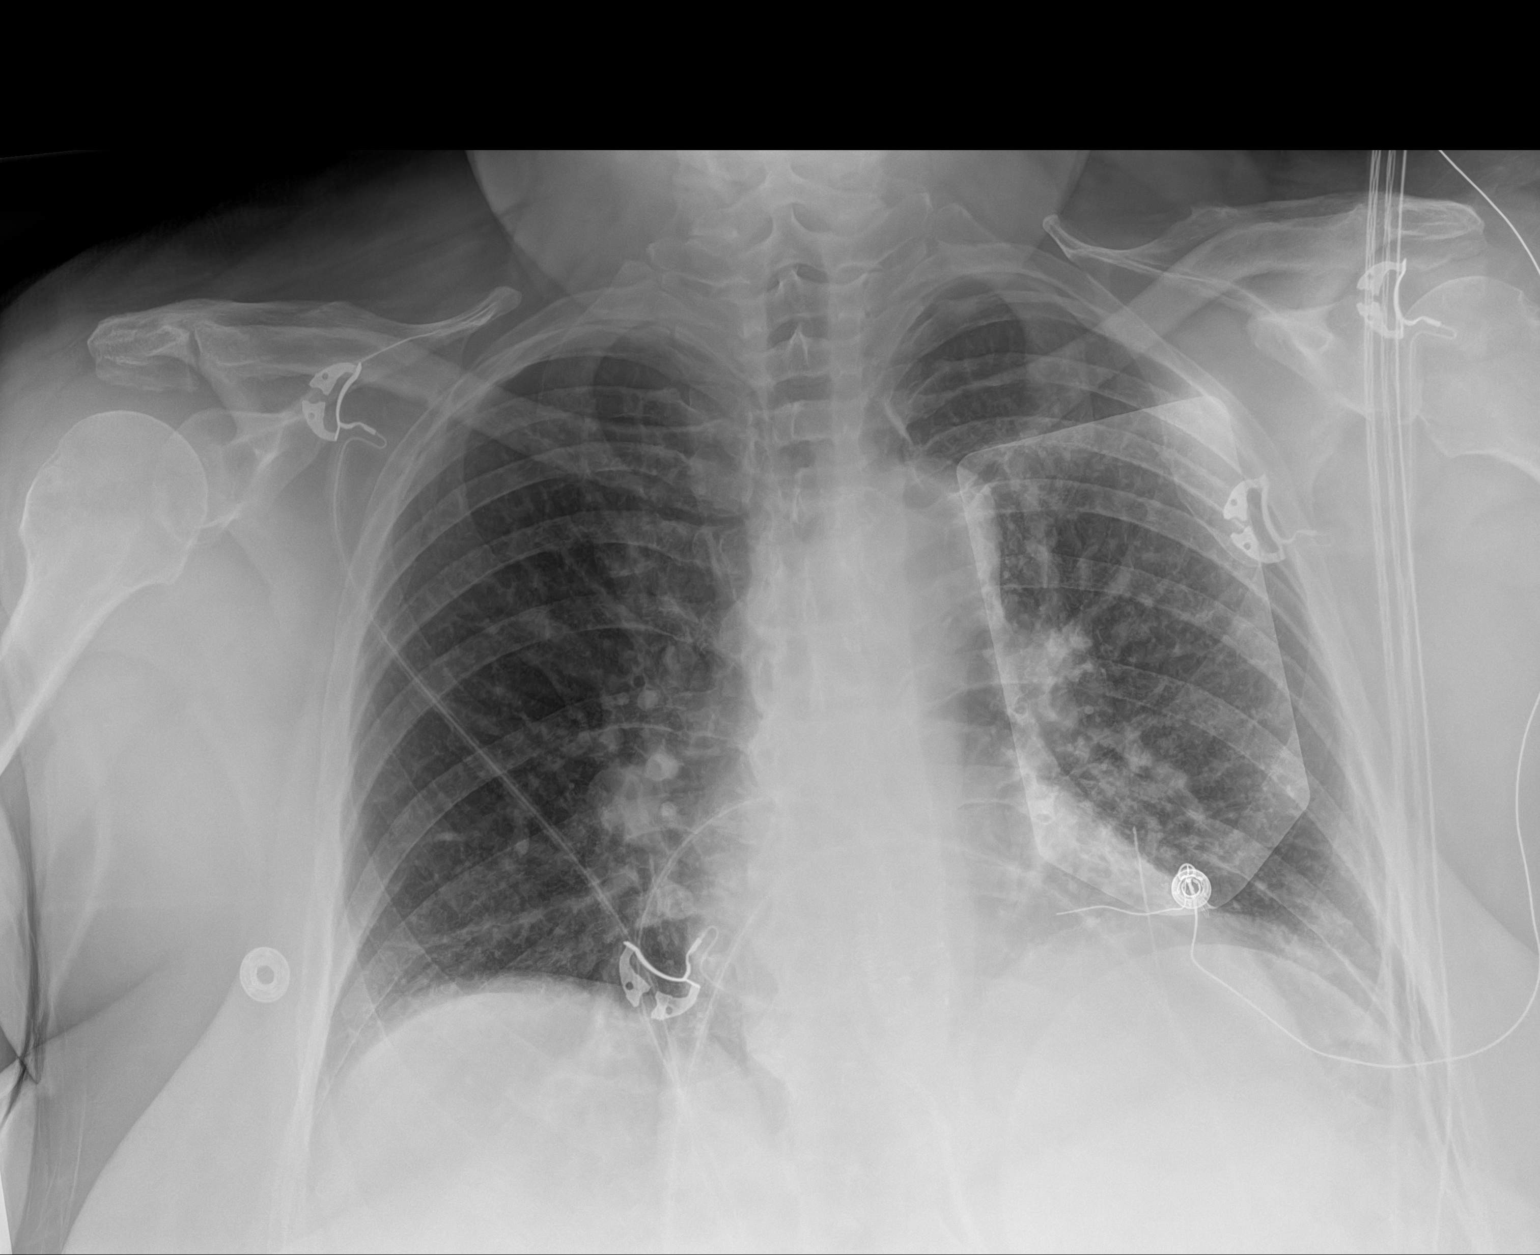

[1 of 1 positions shown; findings below may reference images not displayed]

FINDINGS: Heart size normal. Low lung volumes with bibasilar atelectasis. Mild
left base infiltrate cannot be excluded. No pleural effusion or
pneumothorax.
IMPRESSION: Low lung volumes with bibasilar atelectasis. Mild left base
infiltrate cannot be excluded.

## 2023-01-28 ENCOUNTER — Other Ambulatory Visit: Payer: Self-pay | Admitting: Nurse Practitioner

## 2023-01-28 DIAGNOSIS — K219 Gastro-esophageal reflux disease without esophagitis: Secondary | ICD-10-CM

## 2023-02-15 ENCOUNTER — Encounter: Payer: Self-pay | Admitting: Nurse Practitioner

## 2023-02-15 ENCOUNTER — Ambulatory Visit (INDEPENDENT_AMBULATORY_CARE_PROVIDER_SITE_OTHER): Payer: Commercial Managed Care - HMO | Admitting: Nurse Practitioner

## 2023-02-15 VITALS — BP 136/74 | HR 60 | Temp 97.8°F | Ht 63.0 in | Wt 247.1 lb

## 2023-02-15 DIAGNOSIS — M545 Low back pain, unspecified: Secondary | ICD-10-CM

## 2023-02-15 MED ORDER — KETOROLAC TROMETHAMINE 60 MG/2ML IM SOLN
60.0000 mg | Freq: Once | INTRAMUSCULAR | Status: AC
Start: 2023-02-15 — End: 2023-02-15
  Administered 2023-02-15: 60 mg via INTRAMUSCULAR

## 2023-02-15 MED ORDER — METHYLPREDNISOLONE ACETATE 80 MG/ML IJ SUSP
80.0000 mg | Freq: Once | INTRAMUSCULAR | Status: AC
Start: 2023-02-15 — End: 2023-02-15
  Administered 2023-02-15: 80 mg via INTRAMUSCULAR

## 2023-02-15 NOTE — Patient Instructions (Signed)
Acute Back Pain, Adult Acute back pain is sudden and usually short-lived. It is often caused by an injury to the muscles and tissues in the back. The injury may result from: A muscle, tendon, or ligament getting overstretched or torn. Ligaments are tissues that connect bones to each other. Lifting something improperly can cause a back strain. Wear and tear (degeneration) of the spinal disks. Spinal disks are circular tissue that provide cushioning between the bones of the spine (vertebrae). Twisting motions, such as while playing sports or doing yard work. A hit to the back. Arthritis. You may have a physical exam, lab tests, and imaging tests to find the cause of your pain. Acute back pain usually goes away with rest and home care. Follow these instructions at home: Managing pain, stiffness, and swelling Take over-the-counter and prescription medicines only as told by your health care provider. Treatment may include medicines for pain and inflammation that are taken by mouth or applied to the skin, or muscle relaxants. Your health care provider may recommend applying ice during the first 24-48 hours after your pain starts. To do this: Put ice in a plastic bag. Place a towel between your skin and the bag. Leave the ice on for 20 minutes, 2-3 times a day. Remove the ice if your skin turns bright red. This is very important. If you cannot feel pain, heat, or cold, you have a greater risk of damage to the area. If directed, apply heat to the affected area as often as told by your health care provider. Use the heat source that your health care provider recommends, such as a moist heat pack or a heating pad. Place a towel between your skin and the heat source. Leave the heat on for 20-30 minutes. Remove the heat if your skin turns bright red. This is especially important if you are unable to feel pain, heat, or cold. You have a greater risk of getting burned. Activity  Do not stay in bed. Staying in  bed for more than 1-2 days can delay your recovery. Sit up and stand up straight. Avoid leaning forward when you sit or hunching over when you stand. If you work at a desk, sit close to it so you do not need to lean over. Keep your chin tucked in. Keep your neck drawn back, and keep your elbows bent at a 90-degree angle (right angle). Sit high and close to the steering wheel when you drive. Add lower back (lumbar) support to your car seat, if needed. Take short walks on even surfaces as soon as you are able. Try to increase the length of time you walk each day. Do not sit, drive, or stand in one place for more than 30 minutes at a time. Sitting or standing for long periods of time can put stress on your back. Do not drive or use heavy machinery while taking prescription pain medicine. Use proper lifting techniques. When you bend and lift, use positions that put less stress on your back: Bend your knees. Keep the load close to your body. Avoid twisting. Exercise regularly as told by your health care provider. Exercising helps your back heal faster and helps prevent back injuries by keeping muscles strong and flexible. Work with a physical therapist to make a safe exercise program, as recommended by your health care provider. Do any exercises as told by your physical therapist. Lifestyle Maintain a healthy weight. Extra weight puts stress on your back and makes it difficult to have good   posture. Avoid activities or situations that make you feel anxious or stressed. Stress and anxiety increase muscle tension and can make back pain worse. Learn ways to manage anxiety and stress, such as through exercise. General instructions Sleep on a firm mattress in a comfortable position. Try lying on your side with your knees slightly bent. If you lie on your back, put a pillow under your knees. Keep your head and neck in a straight line with your spine (neutral position) when using electronic equipment like  smartphones or pads. To do this: Raise your smartphone or pad to look at it instead of bending your head or neck to look down. Put the smartphone or pad at the level of your face while looking at the screen. Follow your treatment plan as told by your health care provider. This may include: Cognitive or behavioral therapy. Acupuncture or massage therapy. Meditation or yoga. Contact a health care provider if: You have pain that is not relieved with rest or medicine. You have increasing pain going down into your legs or buttocks. Your pain does not improve after 2 weeks. You have pain at night. You lose weight without trying. You have a fever or chills. You develop nausea or vomiting. You develop abdominal pain. Get help right away if: You develop new bowel or bladder control problems. You have unusual weakness or numbness in your arms or legs. You feel faint. These symptoms may represent a serious problem that is an emergency. Do not wait to see if the symptoms will go away. Get medical help right away. Call your local emergency services (911 in the [place] Do not drive yourself to the hospital. Summary Acute back pain is sudden and usually short-lived. Use proper lifting techniques. When you bend and lift, use positions that put less stress on your back. Take over-the-counter and prescription medicines only as told by your health care provider, and apply heat or ice as told. This information is not intended to replace advice given to you by your health care provider. Make sure you discuss any questions you have with your health care provider. Document Revised: 11/28/2020 Document Reviewed: 11/28/2020 Elsevier Patient Education  2024 Elsevier Inc.  

## 2023-02-15 NOTE — Progress Notes (Unsigned)
Subjective:    Patient ID: Kathryn Merritt, female    DOB: [birth year], 62 y.o.   MRN: [id number]   Chief Complaint: Back Pain   Back Pain This is a new problem. The current episode started 1 to 4 weeks ago (about 10 days ago). The problem occurs constantly. The problem has been gradually worsening since onset. The pain is present in the lumbar spine. Quality: crushing. The pain does not radiate. The pain is at a severity of 10/10. The pain is The same all the time. The symptoms are aggravated by standing and lying down. Pertinent negatives include no bladder incontinence, bowel incontinence, chest pain, leg pain, numbness, perianal numbness or tingling. She has tried NSAIDs and heat for the symptoms. The treatment provided mild relief.    Patient Active Problem List   Diagnosis Date Noted   Lesion of left ear 06/24/2022   Vitamin D deficiency 01/21/2022   Stage 3a chronic kidney disease (HCC) 01/21/2022   Esophageal dysphagia 01/13/2022   Subclinical hypothyroidism 10/23/2021   Annual physical exam 09/23/2021   Gastroesophageal reflux disease 07/16/2021   OSA (obstructive sleep apnea) 07/14/2021   Hypertension 06/18/2021   Hyperlipidemia associated with type 2 diabetes mellitus (HCC) 06/18/2021   Paroxysmal atrial fibrillation (HCC) 03/18/2021   Controlled type 2 diabetes mellitus  03/18/2021       Review of Systems  Respiratory:  Negative for shortness of breath.   Cardiovascular:  Negative for chest pain.  Gastrointestinal:  Negative for bowel incontinence.  Genitourinary:  Negative for bladder incontinence.  Musculoskeletal:  Positive for back pain. Negative for gait problem.  Neurological:  Negative for tingling and numbness.  All other systems reviewed and are negative.      Objective:   Physical Exam Vitals and nursing note reviewed.  Constitutional:      Appearance: Normal appearance.  Cardiovascular:     Rate and Rhythm: Normal rate and regular rhythm.      Pulses: Normal pulses.     Heart sounds: Normal heart sounds.  Pulmonary:     Effort: Pulmonary effort is normal.     Breath sounds: Normal breath sounds.  Musculoskeletal:        General: Normal range of motion.     Cervical back: Normal and normal range of motion.     Thoracic back: Normal.     Lumbar back: Tenderness present. No swelling, deformity or bony tenderness. Normal range of motion. Negative right straight leg raise test and negative left straight leg raise test.     Comments: Mild tenderness to palpation bilaterally  Neurological:     General: No focal deficit present.     Mental Status: She is alert and oriented to person, place, and time.      BP 136/74   Pulse 60   Temp 97.8 F (36.6 C) (Temporal)   Ht 5\' 3"  (1.6 m)   Wt 247 lb 2 oz (112.1 kg)   SpO2 99%   BMI 43.78 kg/m       Assessment & Plan:  Kathryn Merritt in today with chief complaint of Back Pain   1. Acute bilateral low back pain without sciatica Moist heat Rest Avoid activities that trigger or increase pain Keep an eye on blood sugars over the next several days; they may be elevated from steroid injection - methylPREDNISolone acetate (DEPO-MEDROL) injection 80 mg - ketorolac (TORADOL) injection 60 mg    The above assessment and management plan was discussed with the patient.  The patient verbalized understanding of and has agreed to the management plan. Patient is aware to call the clinic if symptoms persist or worsen. Patient is aware when to return to the clinic for a follow-up visit. Patient educated on when it is appropriate to go to the emergency department.   Consuello Bossier, FNP student   Bennie Pierini, FNP

## 2023-02-28 ENCOUNTER — Other Ambulatory Visit: Payer: Self-pay | Admitting: Nurse Practitioner

## 2023-02-28 DIAGNOSIS — I10 Essential (primary) hypertension: Secondary | ICD-10-CM

## 2023-02-28 DIAGNOSIS — K219 Gastro-esophageal reflux disease without esophagitis: Secondary | ICD-10-CM

## 2023-03-14 ENCOUNTER — Other Ambulatory Visit (HOSPITAL_COMMUNITY): Payer: Self-pay

## 2023-03-15 ENCOUNTER — Other Ambulatory Visit: Payer: Self-pay | Admitting: Cardiology

## 2023-03-22 ENCOUNTER — Other Ambulatory Visit: Payer: Self-pay

## 2023-03-22 ENCOUNTER — Emergency Department (HOSPITAL_COMMUNITY)
Admission: EM | Admit: 2023-03-22 | Discharge: 2023-03-22 | Disposition: A | Discharge status: Home / Self Care | Payer: Commercial Managed Care - HMO | Attending: Emergency Medicine | Admitting: Emergency Medicine

## 2023-03-22 DIAGNOSIS — Z7901 Long term (current) use of anticoagulants: Secondary | ICD-10-CM | POA: Diagnosis not present

## 2023-03-22 DIAGNOSIS — Z79899 Other long term (current) drug therapy: Secondary | ICD-10-CM | POA: Diagnosis not present

## 2023-03-22 DIAGNOSIS — R002 Palpitations: Secondary | ICD-10-CM | POA: Diagnosis present

## 2023-03-22 DIAGNOSIS — R2243 Localized swelling, mass and lump, lower limb, bilateral: Secondary | ICD-10-CM | POA: Diagnosis not present

## 2023-03-22 DIAGNOSIS — I4891 Unspecified atrial fibrillation: Secondary | ICD-10-CM | POA: Diagnosis not present

## 2023-03-22 LAB — CBC
HCT: 47.3 % — ABNORMAL HIGH (ref 36.0–46.0)
Hemoglobin: 15.6 g/dL — ABNORMAL HIGH (ref 12.0–15.0)
MCH: 29.9 pg (ref 26.0–34.0)
MCHC: 33 g/dL (ref 30.0–36.0)
MCV: 90.6 fL (ref 80.0–100.0)
Platelets: 225 10*3/uL (ref 150–400)
RBC: 5.22 MIL/uL — ABNORMAL HIGH (ref 3.87–5.11)
RDW: 14.6 % (ref 11.5–15.5)
WBC: 8 10*3/uL (ref 4.0–10.5)
nRBC: 0 % (ref 0.0–0.2)

## 2023-03-22 LAB — BASIC METABOLIC PANEL
Anion gap: 9 (ref 5–15)
BUN: 22 mg/dL — ABNORMAL HIGH (ref 6–20)
CO2: 24 mmol/L (ref 22–32)
Calcium: 10.4 mg/dL — ABNORMAL HIGH (ref 8.9–10.3)
Chloride: 107 mmol/L (ref 98–111)
Creatinine, Ser: 1.03 mg/dL — ABNORMAL HIGH (ref 0.44–1.00)
GFR, Estimated: >60 mL/min (ref >60)
Glucose, Bld: 171 mg/dL — ABNORMAL HIGH (ref 70–99)
Potassium: 3.8 mmol/L (ref 3.5–5.1)
Sodium: 140 mmol/L (ref 135–145)

## 2023-03-22 LAB — MAGNESIUM: Magnesium: 1.8 mg/dL (ref 1.7–2.4)

## 2023-03-22 MED ORDER — ETOMIDATE 2 MG/ML IV SOLN
10.0000 mg | Freq: Once | INTRAVENOUS | Status: DC
  Filled 2023-03-22: qty 10

## 2023-03-22 MED ORDER — ETOMIDATE 2 MG/ML IV SOLN
INTRAVENOUS | Status: AC | PRN
  Administered 2023-03-22: 8 mg via INTRAVENOUS

## 2023-03-22 MED ORDER — ONDANSETRON HCL 4 MG/2ML IJ SOLN
4.0000 mg | Freq: Once | INTRAMUSCULAR | Status: AC
  Administered 2023-03-22: 4 mg via INTRAVENOUS
  Filled 2023-03-22: qty 2

## 2023-03-22 NOTE — Sedation Documentation (Signed)
Emesis x1, EDP contacted and orders for zofran to be provided.

## 2023-03-22 NOTE — ED Notes (Signed)
AVS provided to and discussed with patient and family member at bedside. Pt verbalizes understanding of discharge instructions and denies any questions or concerns at this time. Pt has ride home. Pt ambulated out of department independently with steady gait.  

## 2023-03-22 NOTE — ED Notes (Signed)
Spouse arrived at bedside. 

## 2023-03-22 NOTE — Sedation Documentation (Signed)
Pt appears to be converted to NSR, EKG repeated

## 2023-03-22 NOTE — ED Triage Notes (Signed)
C/o palpitations that woke her from sleep tonight. H/o afib, on eliquis, stopped rate control medication d/t low BP. Denies fever, chills, CP Endorses SOB

## 2023-03-22 NOTE — Discharge Instructions (Addendum)
You were seen for your atrial fibrillation in the emergency department.   At home, please continue your medications.    Check your MyChart online for the results of any tests that had not resulted by the time you left the emergency department.   Follow-up with your primary doctor in 2-3 days regarding your visit.  The atrial fibrillation clinic will call you in 3 days to set up an appointment.  Return immediately to the emergency department if you experience any of the following: Palpitations, chest pain, shortness of breath, or any other concerning symptoms.    Thank you for visiting our Emergency Department. It was a pleasure taking care of you today.

## 2023-03-22 NOTE — ED Notes (Signed)
Pt provided with diet shasta per request. Tolerating well.

## 2023-03-22 NOTE — ED Provider Notes (Signed)
[place] EMERGENCY DEPARTMENT AT [facility] Provider Note   CSN: [id number] Arrival date & time: 03/22/23  4098     History {Add pertinent medical, surgical, social history, OB history to HPI:1} Chief Complaint  Patient presents with   Palpitations    Kathryn Merritt is a 62 y.o. female.  HPI     This is a 62 year old female with a history of atrial fibrillation on Eliquis who reports palpitations.  Patient states that she woke up from her sleep with acute onset palpitations.  She is currently not on any rate control medications secondary to hypotension.  Denies any recent illnesses or fevers.  No recent changes in diet.  She did state that she missed 1 dose of Eliquis yesterday morning but took her night dose last night.  Otherwise she has been compliant with her meds.  She is reporting some associated shortness of breath.  Home Medications Prior to Admission medications   Medication Sig Start Date End Date Taking? Authorizing Provider  acetaminophen (TYLENOL) 500 MG tablet Take 1,000 mg by mouth every 8 (eight) hours as needed for moderate pain.    [provider]  albuterol (VENTOLIN HFA) 108 (90 Base) MCG/ACT inhaler Inhale 2 puffs into the lungs every 6 (six) hours as needed for wheezing or shortness of breath. 12/27/22   Daphine Deutscher, Mary-Margaret, FNP  amLODipine (NORVASC) 5 MG tablet Take 1 tablet (5 mg total) by mouth daily. 11/22/22   Rollene Rotunda, MD  apixaban (ELIQUIS) 5 MG TABS tablet Take 1 tablet (5 mg total) by mouth 2 (two) times daily. 12/23/22   Bennie Pierini, FNP  blood glucose meter kit and supplies Dispense based on patient and insurance preference. Use up to four times daily as directed. (FOR ICD-10 E10.9, E11.9). 10/01/21   Dani Gobble, NP  Cholecalciferol (VITAMIN D3) 50 MCG (2000 UT) capsule Take 1 capsule (2,000 Units total) by mouth daily. 09/27/22   Daryll Drown, NP  Dulaglutide (TRULICITY) 4.5 MG/0.5ML SOPN INJECT 4.5 MG  SUBCUTANEOUSLY ONCE A WEEK Patient not taking: Reported on 02/15/2023 12/23/22   Bennie Pierini, FNP  lisinopril-hydrochlorothiazide (ZESTORETIC) 20-25 MG tablet Take 1 tablet by mouth daily. 02/28/23   Daphine Deutscher, Mary-Margaret, FNP  lubiprostone (AMITIZA) 24 MCG capsule Take 24 mcg by mouth 2 (two) times daily. 01/21/23   [provider]  meclizine (ANTIVERT) 12.5 MG tablet Take 1 tablet (12.5 mg total) by mouth 3 (three) times daily as needed for dizziness. Patient not taking: Reported on 02/15/2023 10/11/22   Gabriel Earing, FNP  omeprazole (PRILOSEC) 40 MG capsule Take 1 capsule (40 mg total) by mouth daily. 02/28/23   Daphine Deutscher, Mary-Margaret, FNP  rosuvastatin (CRESTOR) 10 MG tablet Take 1 tablet (10 mg total) by mouth daily. 12/23/22   Daphine Deutscher, Mary-Margaret, FNP  Semaglutide (RYBELSUS) 3 MG TABS Take 3 mg by mouth daily. Dr. Jovita Gamma samples    [provider]      Allergies    Patient has no known allergies.    Review of Systems   Review of Systems  Constitutional:  Negative for fever.  Respiratory:  Positive for shortness of breath.   Cardiovascular:  Positive for palpitations. Negative for chest pain and leg swelling.  All other systems reviewed and are negative.   Physical Exam Updated Vital Signs BP 121/88   Pulse 63   Temp 98.7 F (37.1 C) (Oral)   Resp 11   Wt 115 kg   SpO2 98%   BMI 44.91 kg/m  Physical Exam Vitals and nursing note reviewed.  Constitutional:      Appearance: She is well-developed. She is obese.     Comments: Anxious appearing, nontoxic  HENT:     Head: Normocephalic and atraumatic.     Nose: Nose normal.  Eyes:     Pupils: Pupils are equal, round, and reactive to light.  Cardiovascular:     Rate and Rhythm: Tachycardia present. Rhythm irregular.     Heart sounds: Normal heart sounds.  Pulmonary:     Effort: Pulmonary effort is normal. No respiratory distress.     Breath sounds: No wheezing.  Abdominal:     General: Bowel sounds  are normal.     Palpations: Abdomen is soft.  Musculoskeletal:     Cervical back: Neck supple.     Right lower leg: Edema present.     Left lower leg: Edema present.  Skin:    General: Skin is warm and dry.  Neurological:     Mental Status: She is alert and oriented to person, place, and time.  Psychiatric:        Mood and Affect: Mood normal.     ED Results / Procedures / Treatments   Labs (all labs ordered are listed, but only abnormal results are displayed) Labs Reviewed  BASIC METABOLIC PANEL - Abnormal; Notable for the following components:      Result Value   Glucose, Bld 171 (*)    BUN 22 (*)    Creatinine, Ser 1.03 (*)    Calcium 10.4 (*)    All other components within normal limits  CBC - Abnormal; Notable for the following components:   RBC 5.22 (*)    Hemoglobin 15.6 (*)    HCT 47.3 (*)    All other components within normal limits  MAGNESIUM    EKG EKG Interpretation Date/Time:  Tuesday March 22 2023 05:25:44 EDT Ventricular Rate:  145 PR Interval:    QRS Duration:  137 QT Interval:  296 QTC Calculation: 460 R Axis:   -38  Text Interpretation: Atrial fibrillation IVCD, consider atypical RBBB Probable lateral infarct, age indeterminate Confirmed by Ross Marcus ([id number]) on 03/22/2023 5:58:02 AM   EKG Interpretation Date/Time:  Tuesday March 22 2023 06:31:46 EDT Ventricular Rate:  63 PR Interval:  102 QRS Duration:  147 QT Interval:  399 QTC Calculation: 409 R Axis:   25  Text Interpretation: Sinus or ectopic atrial rhythm Atrial premature complex Short PR interval Nonspecific intraventricular conduction delay Probable lateral infarct, age indeterminate Confirmed by Ross Marcus ([id number]) on 03/22/2023 6:40:36 AM         Radiology No results found.  Procedures Procedures  {Document cardiac monitor, telemetry assessment procedure when appropriate:1}  Medications Ordered in ED Medications  etomidate (AMIDATE) injection 10 mg (0 mg  Intravenous Hold 03/22/23 0621)  etomidate (AMIDATE) injection (8 mg Intravenous Given 03/22/23 0618)  ondansetron (ZOFRAN) injection 4 mg (4 mg Intravenous Given 03/22/23 0981)    ED Course/ Medical Decision Making/ A&P Clinical Course as of 03/22/23 0640  Tue Mar 22, 2023  0545 Gust the risk and benefits of cardioversion with the patient.  She has missed a dose of Eliquis but she is fairly sure of her onset of atrial fibrillation with symptoms starting this morning.  She is still fairly low risk.  She is elected for cardioversion.  She was consented regarding the risk and benefits including acute stroke. [CH]  1914 Patient cardioverted successfully. [CH]    Clinical Course User  Index [CH] Mattie Nordell, Mayer Masker, MD   {   Click here for ABCD2, HEART and other calculatorsREFRESH Note before signing :1}                          Medical Decision Making Amount and/or Complexity of Data Reviewed Labs: ordered.  Risk Prescription drug management.   ***  {Document critical care time when appropriate:1} {Document review of labs and clinical decision tools ie heart score, Chads2Vasc2 etc:1}  {Document your independent review of radiology images, and any outside records:1} {Document your discussion with family members, caretakers, and with consultants:1} {Document social determinants of health affecting pt's care:1} {Document your decision making why or why not admission, treatments were needed:1} Final Clinical Impression(s) / ED Diagnoses Final diagnoses:  None    Rx / DC Orders ED Discharge Orders          Ordered    Amb referral to AFIB Clinic        03/22/23 0528

## 2023-03-22 NOTE — ED Provider Notes (Signed)
  Physical Exam  BP 131/86   Pulse 64   Temp 98.7 F (37.1 C) (Oral)   Resp 14   Wt 115 kg   SpO2 95%   BMI 44.91 kg/m   Physical Exam  Procedures  Procedures  ED Course / MDM   Clinical Course as of 03/22/23 0801  Tue Mar 22, 2023  0545 Gust the risk and benefits of cardioversion with the patient.  She has missed a dose of Eliquis but she is fairly sure of her onset of atrial fibrillation with symptoms starting this morning.  She is still fairly low risk.  She is elected for cardioversion.  She was consented regarding the risk and benefits including acute stroke. [CH]  0981 Patient cardioverted successfully. [CH]  [id number] Assumed care from Dr Wilkie Aye. 62 yo F with hx of AF on eliquis who presented with AF in RVR was cardioverted ~25 mins ago. Feeling better but is still feeling off from the sedation. When is more awake can be dc'd low concern for secondary cause of her AF RVR. Now back in NSR.  [RP]  0800 Patient reevaluated.  Feels that she is back to baseline.  Remains in normal sinus rhythm.  Requesting go home at this time.  Will have her follow-up with atrial fibrillation clinic and her primary doctor in several days. [RP]    Clinical Course User Index [CH] Horton, Mayer Masker, MD [RP] Rondel Baton, MD   Medical Decision Making Amount and/or Complexity of Data Reviewed Labs: ordered.  Risk Prescription drug management.      Rondel Baton, MD 03/22/23 [phone]

## 2023-03-23 ENCOUNTER — Telehealth: Payer: Self-pay

## 2023-03-23 NOTE — Transitions of Care (Post Inpatient/ED Visit) (Signed)
03/23/2023  Name: Kathryn Merritt MRN: [id number] DOB: 04-Dec-1960  Today's TOC FU Call Status: Today's TOC FU Call Status:: Successful TOC FU Call Competed TOC FU Call Complete Date: 03/23/23  Transition Care Management Follow-up Telephone Call Date of Discharge: 03/22/23 [facility] Type of Discharge: Emergency Department Reason for ED Visit: Cardiac Conditions Cardiac Conditions Diagnosis: Atrial Fibrillation How have you been since you were released from the hospital?: Better Any questions or concerns?: No  Items Reviewed: Did you receive and understand the discharge instructions provided?: Yes Medications obtained,verified, and reconciled?: Yes (Medications Reviewed) Any new allergies since your discharge?: No Dietary orders reviewed?: NA Do you have support at home?: Yes  Medications Reviewed Today: Medications Reviewed Today     Reviewed by Raelyn Number, CMA (Certified Medical Assistant) on 03/23/23 at 1349  Med List Status: <None>   Medication Order Taking? Sig Documenting Provider Last Dose Status Informant  acetaminophen (TYLENOL) 500 MG tablet [id number] Yes Take 1,000 mg by mouth every 8 (eight) hours as needed for moderate pain. [provider] Taking Active Self, Pharmacy Records  albuterol (VENTOLIN HFA) 108 (90 Base) MCG/ACT inhaler [id number] Yes Inhale 2 puffs into the lungs every 6 (six) hours as needed for wheezing or shortness of breath. Daphine Deutscher, Mary-Margaret, FNP Taking Active   amLODipine (NORVASC) 5 MG tablet [id number] Yes Take 1 tablet (5 mg total) by mouth daily. Rollene Rotunda, MD Taking Active Self, Pharmacy Records  apixaban Lifescape) 5 MG TABS tablet [id number] Yes Take 1 tablet (5 mg total) by mouth 2 (two) times daily. Bennie Pierini, FNP Taking Active   blood glucose meter kit and supplies [id number] Yes Dispense based on patient and insurance preference. Use up to four times daily as directed. (FOR ICD-10  E10.9, E11.9). Dani Gobble, NP Taking Active Self, Pharmacy Records  Cholecalciferol (VITAMIN D3) 50 MCG (2000 UT) capsule [id number] Yes Take 1 capsule (2,000 Units total) by mouth daily. Daryll Drown, NP Taking Active Self, Pharmacy Records  Dulaglutide (TRULICITY) 4.5 MG/0.5ML Namon Cirri [id number] Yes INJECT 4.5 MG SUBCUTANEOUSLY ONCE A WEEK Daphine Deutscher, Mary-Margaret, FNP Taking Active   lisinopril-hydrochlorothiazide (ZESTORETIC) 20-25 MG tablet [id number] Yes Take 1 tablet by mouth daily. Daphine Deutscher Mary-Margaret, FNP Taking Active   lubiprostone (AMITIZA) 24 MCG capsule [id number] Yes Take 24 mcg by mouth 2 (two) times daily. [provider] Taking Active   meclizine (ANTIVERT) 12.5 MG tablet [id number] Yes Take 1 tablet (12.5 mg total) by mouth 3 (three) times daily as needed for dizziness. Gabriel Earing, FNP Taking Active Self, Pharmacy Records  omeprazole (PRILOSEC) 40 MG capsule [id number] Yes Take 1 capsule (40 mg total) by mouth daily. Daphine Deutscher Mary-Margaret, FNP Taking Active   rosuvastatin (CRESTOR) 10 MG tablet [id number] Yes Take 1 tablet (10 mg total) by mouth daily. Daphine Deutscher Mary-Margaret, FNP Taking Active   Semaglutide (RYBELSUS) 3 MG TABS [id number] Yes Take 3 mg by mouth daily. Dr. Jovita Gamma samples [provider] Taking Active Self, Pharmacy Records            Home Care and Equipment/Supplies: Were Home Health Services Ordered?: NA Any new equipment or medical supplies ordered?: NA  Functional Questionnaire: Do you need assistance with bathing/showering or dressing?: No Do you need assistance with meal preparation?: No Do you need assistance with eating?: No Do you have difficulty maintaining continence: No Do you need assistance with getting out of bed/getting out of a chair/moving?: No Do you have difficulty managing or taking your medications?: No  Follow up appointments reviewed: PCP Follow-up appointment confirmed?: Yes Date of PCP follow-up  appointment?: 03/29/23 Follow-up Provider: [facility] Follow-up appointment confirmed?: Yes Date of Specialist follow-up appointment?: 03/29/23 Follow-Up Specialty Provider:: [facility] Do you need transportation to your follow-up appointment?: No Do you understand care options if your condition(s) worsen?: Yes-patient verbalized understanding    SIGNATURE Abby Tawfiq Favila, CMA  [facility] AWV Team Direct Dial: [phone]

## 2023-03-29 ENCOUNTER — Ambulatory Visit (HOSPITAL_COMMUNITY)
Admission: RE | Admit: 2023-03-29 | Discharge: 2023-03-29 | Disposition: A | Discharge status: Home / Self Care | Payer: Commercial Managed Care - HMO | Source: Ambulatory Visit | Attending: Internal Medicine | Admitting: Internal Medicine

## 2023-03-29 ENCOUNTER — Ambulatory Visit (INDEPENDENT_AMBULATORY_CARE_PROVIDER_SITE_OTHER): Payer: Commercial Managed Care - HMO | Admitting: Nurse Practitioner

## 2023-03-29 ENCOUNTER — Encounter: Payer: Self-pay | Admitting: Nurse Practitioner

## 2023-03-29 VITALS — BP 122/68 | HR 57 | Ht 63.0 in | Wt 249.0 lb

## 2023-03-29 VITALS — BP 111/74 | HR 60 | Temp 97.7°F | Resp 20 | Ht 63.0 in | Wt 247.0 lb

## 2023-03-29 DIAGNOSIS — E038 Other specified hypothyroidism: Secondary | ICD-10-CM

## 2023-03-29 DIAGNOSIS — I10 Essential (primary) hypertension: Secondary | ICD-10-CM | POA: Diagnosis not present

## 2023-03-29 DIAGNOSIS — Z7985 Long-term (current) use of injectable non-insulin antidiabetic drugs: Secondary | ICD-10-CM | POA: Insufficient documentation

## 2023-03-29 DIAGNOSIS — K219 Gastro-esophageal reflux disease without esophagitis: Secondary | ICD-10-CM

## 2023-03-29 DIAGNOSIS — D6869 Other thrombophilia: Secondary | ICD-10-CM | POA: Diagnosis not present

## 2023-03-29 DIAGNOSIS — R1319 Other dysphagia: Secondary | ICD-10-CM

## 2023-03-29 DIAGNOSIS — Z7901 Long term (current) use of anticoagulants: Secondary | ICD-10-CM | POA: Insufficient documentation

## 2023-03-29 DIAGNOSIS — E119 Type 2 diabetes mellitus without complications: Secondary | ICD-10-CM | POA: Insufficient documentation

## 2023-03-29 DIAGNOSIS — E559 Vitamin D deficiency, unspecified: Secondary | ICD-10-CM

## 2023-03-29 DIAGNOSIS — Z91199 Patient's noncompliance with other medical treatment and regimen due to unspecified reason: Secondary | ICD-10-CM | POA: Diagnosis not present

## 2023-03-29 DIAGNOSIS — I480 Paroxysmal atrial fibrillation: Secondary | ICD-10-CM

## 2023-03-29 DIAGNOSIS — G4730 Sleep apnea, unspecified: Secondary | ICD-10-CM | POA: Diagnosis not present

## 2023-03-29 DIAGNOSIS — R600 Localized edema: Secondary | ICD-10-CM

## 2023-03-29 DIAGNOSIS — E1169 Type 2 diabetes mellitus with other specified complication: Secondary | ICD-10-CM

## 2023-03-29 DIAGNOSIS — G4733 Obstructive sleep apnea (adult) (pediatric): Secondary | ICD-10-CM

## 2023-03-29 DIAGNOSIS — N1831 Chronic kidney disease, stage 3a: Secondary | ICD-10-CM

## 2023-03-29 LAB — CMP14+EGFR
ALT: 13 IU/L (ref 0–32)
AST: 13 IU/L (ref 0–40)
Albumin: 3.9 g/dL (ref 3.8–4.9)
Alkaline Phosphatase: 69 IU/L (ref 44–121)
BUN/Creatinine Ratio: 20 (ref 12–28)
BUN: 20 mg/dL (ref 8–27)
Calcium: 9.9 mg/dL (ref 8.7–10.3)
Globulin, Total: 2 g/dL (ref 1.5–4.5)
Potassium: 3.9 mmol/L (ref 3.5–5.2)
Sodium: 142 mmol/L (ref 134–144)
Total Protein: 5.9 g/dL — ABNORMAL LOW (ref 6.0–8.5)
eGFR: 64 mL/min/{1.73_m2} (ref >59)

## 2023-03-29 LAB — CBC WITH DIFFERENTIAL/PLATELET
EOS (ABSOLUTE): 0.1 10*3/uL (ref 0.0–0.4)
Hemoglobin: 13 g/dL (ref 11.1–15.9)
Immature Grans (Abs): 0 10*3/uL (ref 0.0–0.1)
Lymphs: 39 %
MCHC: 31.7 g/dL (ref 31.5–35.7)
MCV: 91 fL (ref 79–97)
Monocytes: 6 %
Neutrophils Absolute: 3.1 10*3/uL (ref 1.4–7.0)
Neutrophils: 52 %

## 2023-03-29 LAB — LIPID PANEL

## 2023-03-29 LAB — BAYER DCA HB A1C WAIVED: HB A1C (BAYER DCA - WAIVED): 8 % — ABNORMAL HIGH (ref 4.8–5.6)

## 2023-03-29 MED ORDER — FUROSEMIDE 20 MG PO TABS
20.0000 mg | ORAL_TABLET | Freq: Every day | ORAL | 3 refills | Status: DC
Start: 2023-03-29 — End: 2023-04-04

## 2023-03-29 MED ORDER — OMEPRAZOLE 40 MG PO CPDR
40.0000 mg | DELAYED_RELEASE_CAPSULE | Freq: Every day | ORAL | 1 refills | Status: DC
Start: 2023-03-29 — End: 2023-05-05

## 2023-03-29 MED ORDER — LISINOPRIL 20 MG PO TABS
20.0000 mg | ORAL_TABLET | Freq: Every day | ORAL | 3 refills | Status: DC
Start: 2023-03-29 — End: 2023-08-01

## 2023-03-29 MED ORDER — AMLODIPINE BESYLATE 5 MG PO TABS: 5.0000 mg | ORAL_TABLET | Freq: Every day | ORAL | 1 refills | Status: DC

## 2023-03-29 MED ORDER — LUBIPROSTONE 24 MCG PO CAPS: 24.0000 ug | ORAL_CAPSULE | Freq: Two times a day (BID) | ORAL | 1 refills | Status: DC

## 2023-03-29 MED ORDER — TIRZEPATIDE 5 MG/0.5ML ~~LOC~~ SOAJ
5.0000 mg | SUBCUTANEOUS | 3 refills | Status: DC
Start: 2023-03-29 — End: 2023-05-30

## 2023-03-29 NOTE — Progress Notes (Signed)
Primary Care Physician: Bennie Pierini, FNP Primary Cardiologist: Rollene Rotunda, MD Electrophysiologist: None     Referring Physician: [facility]     Kathryn Merritt is a 62 y.o. female with a history of T2DM, moderate OSA, HLD, aortic atherosclerosis on CT, GERD, RBBB, HTN, and paroxysmal atrial fibrillation who presents for consultation in the [facility]. The patient was initially diagnosed with atrial fibrillation in 2022. Recently seen in ED on 03/22/23 due to Afib with RVR and underwent successful DCCV. Patient is on Eliquis 5 mg BID for a CHADS2VASC score of 3.  On evaluation today, she is currently in NSR. She feels tired and out of breath when in Afib. Patient admits to not treating her sleep apnea due to financial difficulty. She has missed a total of 3 doses of Eliquis since cardioversion.   Today, she denies symptoms of palpitations, chest pain, orthopnea, PND, lower extremity edema, dizziness, presyncope, syncope, snoring, daytime somnolence, bleeding, or neurologic sequela. The patient is tolerating medications without difficulties and is otherwise without complaint today.    Atrial Fibrillation Risk Factors:  she does have diagnosis of sleep apnea. she is not compliant with CPAP therapy.  she has a BMI of Body mass index is 44.11 kg/m.Marland Kitchen Filed Weights   03/29/23 1512  Weight: 112.9 kg    Current Outpatient Medications  Medication Sig Dispense Refill   acetaminophen (TYLENOL) 500 MG tablet Take 1,000 mg by mouth every 8 (eight) hours as needed for moderate pain.     albuterol (VENTOLIN HFA) 108 (90 Base) MCG/ACT inhaler Inhale 2 puffs into the lungs every 6 (six) hours as needed for wheezing or shortness of breath. 8.5 g 5   amLODipine (NORVASC) 5 MG tablet Take 1 tablet (5 mg total) by mouth daily. 90 tablet 1   apixaban (ELIQUIS) 5 MG TABS tablet Take 1 tablet (5 mg total) by mouth 2 (two) times daily. 180 tablet 1   blood  glucose meter kit and supplies Dispense based on patient and insurance preference. Use up to four times daily as directed. (FOR ICD-10 E10.9, E11.9). 1 each 0   furosemide (LASIX) 20 MG tablet Take 1 tablet (20 mg total) by mouth daily. 30 tablet 3   lisinopril (ZESTRIL) 20 MG tablet Take 1 tablet (20 mg total) by mouth daily. 90 tablet 3   lubiprostone (AMITIZA) 24 MCG capsule Take 1 capsule (24 mcg total) by mouth 2 (two) times daily. 90 capsule 1   meclizine (ANTIVERT) 12.5 MG tablet Take 1 tablet (12.5 mg total) by mouth 3 (three) times daily as needed for dizziness. 30 tablet 0   omeprazole (PRILOSEC) 40 MG capsule Take 1 capsule (40 mg total) by mouth daily. 30 capsule 1   rosuvastatin (CRESTOR) 10 MG tablet Take 1 tablet (10 mg total) by mouth daily. 90 tablet 1   Semaglutide (RYBELSUS) 3 MG TABS Take 3 mg by mouth daily. Dr. Jovita Gamma samples     Cholecalciferol (VITAMIN D3) 50 MCG (2000 UT) capsule Take 1 capsule (2,000 Units total) by mouth daily. (Patient not taking: Reported on 03/29/2023) 90 capsule 1   Dulaglutide (TRULICITY) 4.5 MG/0.5ML SOPN INJECT 4.5 MG SUBCUTANEOUSLY ONCE A WEEK (Patient not taking: Reported on 03/29/2023) 12 mL 0   tirzepatide (MOUNJARO) 5 MG/0.5ML Pen Inject 5 mg into the skin once a week. (Patient not taking: Reported on 03/29/2023) 6 mL 3   No current facility-administered medications for this encounter.    Atrial Fibrillation Management  history:  Previous antiarrhythmic drugs: None Previous cardioversions: 03/18/21, 11/20/2021, 03/22/23 Previous ablations: None Anticoagulation history: Eliquis 5 mg BID   ROS- All systems are reviewed and negative except as per the HPI above.  Physical Exam: BP 122/68   Pulse (!) 57   Ht 5\' 3"  (1.6 m)   Wt 112.9 kg   BMI 44.11 kg/m   GEN: Well nourished, well developed in no acute distress NECK: No JVD; No carotid bruits CARDIAC: Regular rate and rhythm, no murmurs, rubs, gallops RESPIRATORY:  Clear to auscultation without  rales, wheezing or rhonchi  ABDOMEN: Soft, non-tender, non-distended EXTREMITIES:  No edema; No deformity   EKG today demonstrates  Vent. rate 57 BPM PR interval 112 ms QRS duration 126 ms QT/QTcB 432/420 ms P-R-T axes -30 155 21 Unusual P axis, possible ectopic atrial bradycardia Right bundle branch block Possible Lateral infarct , age undetermined Abnormal ECG When compared with ECG of 22-Mar-2023 06:31, PREVIOUS ECG IS PRESENT  Echo 06/08/22 demonstrated   1. Left ventricular ejection fraction, by estimation, is 65 to 70%. The  left ventricle has normal function. The left ventricle has no regional  wall motion abnormalities. There is mild left ventricular hypertrophy.  Left ventricular diastolic parameters  are consistent with Grade I diastolic dysfunction (impaired relaxation).  Elevated left atrial pressure.   2. Right ventricular systolic function is normal. The right ventricular  size is normal. There is normal pulmonary artery systolic pressure.   3. Left atrial size was moderately dilated.   4. Right atrial size was mild to moderately dilated.   5. The mitral valve is normal in structure. No evidence of mitral valve  regurgitation. No evidence of mitral stenosis.   6. The tricuspid valve is abnormal.   7. The aortic valve is tricuspid. Aortic valve regurgitation is not  visualized. No aortic stenosis is present.   8. The inferior vena cava is normal in size with greater than 50%  respiratory variability, suggesting right atrial pressure of 3 mmHg.   ASSESSMENT & PLAN CHA2DS2-VASc Score = 4  The patient's score is based upon: CHF History: 0 HTN History: 1 Diabetes History: 1 Stroke History: 0 Vascular Disease History: 1 Age Score: 0 Gender Score: 1       ASSESSMENT AND PLAN: Paroxysmal Atrial Fibrillation (ICD10:  I48.0) The patient's CHA2DS2-VASc score is 4, indicating a 4.8% annual risk of stroke.   S/p successful DCCV on 03/22/23 in the ED.  She is  currently in NSR. We discussed at length potential options for treatment going forward. I did emphasize the necessity and importance of sleep apnea treatment if she is able to afford it at this time. It could be driving her episodes or increase burden in the future. We discussed goal of 10% weight loss per guideline recommendations, which may be facilitated in part by medication she will begin for T2DM treatment. We went over Tikosyn in detail regarding admission and medication interactions. We discussed amiodarone as a bridge to ablation but perhaps Tikosyn would still be advised due to her age. We discussed getting below BMI 40 so she can be considered as a good candidate for ablation. Patient would like to think over options and consider pricing with insurance before making a decision. We also discussed watchful waiting with rhythm monitoring device - patient appears to have overall Merritt burden but episodes tend to require annual ED urgent cardioversion.   She will call back with decision.  Secondary Hypercoagulable State (ICD10:  D68.69) The patient  is at significant risk for stroke/thromboembolism based upon her CHA2DS2-VASc Score of 4.  Continue Apixaban (Eliquis).  Importance of compliance emphasized.   Patient will call with decision on how to proceed with treatment.   Lake Bells, PA-C  [facility] [address] [place], [place] [address] [phone]

## 2023-03-29 NOTE — Progress Notes (Signed)
Subjective:    Patient ID: Kathryn Merritt, female    DOB: [birth year], 61 y.o.   MRN: [id number]   Chief Complaint medical management of chronic issues      HPI:  Kathryn Merritt is a 62 y.o. who identifies as a female who was assigned female at birth.   Social history: Lives with: husband Work history: retired.   Comes in today for follow up of the following chronic medical issues:  1. Hypertension, unspecified type No c/o chest pain, sob or headache. Does not check blood pressure at home. BP Readings from Last 3 Encounters:  03/22/23 123/81  02/15/23 136/74  12/23/22 (!) 138/92     2. Paroxysmal atrial fibrillation (HCC) No palpitations or heart racing. Is on eliquis with no bleeding issues. Dx last Tuesday and had cardioversion last week. Is going  to afib clinic at [facility] today.  3. Gastroesophageal reflux disease, unspecified whether esophagitis present Is on omeprazole daily and is doing well.  4. Esophageal dysphagia No swallowing issues  5. Hyperlipidemia associated with type 2 diabetes mellitus (HCC) Does not watch diet very closely and does no dedicated exercise. Lab Results  Component Value Date   CHOL 131 12/23/2022   HDL 51 12/23/2022   LDLCALC 58 12/23/2022   TRIG 121 12/23/2022   CHOLHDL 2.6 12/23/2022     6. Long-term current use of injectable noninsulin antidiabetic medication Fasting blood sugars are running around 140. No low blood sugars. Has been out of trulicity and rybellsus for over a month. Trulicity as been on backorder and we were giving her rybelsus samples , but she did not cll and let us know she was out. She was on mounjario earlier in the year and that worked the best for her.  Lab Results  Component Value Date   HGBA1C 6.6 (H) 12/23/2022     7. Subclinical hypothyroidism No issues that she is of.  Lab Results  Component Value Date   TSH 3.690 10/23/2021     8. Stage 3a chronic kidney disease (HCC) No voiding issues Lab  Results  Component Value Date   CREATININE 1.03 (H) 03/22/2023      9. Vitamin D deficiency On daily vitamin d supplement  10. OSA (obstructive sleep apnea) She has a cpap machine but doe snot use it.   11. Morbid obesity (HCC) Weight is down 6 lbs Wt Readings from Last 3 Encounters:  03/29/23 247 lb (112 kg)  03/22/23 253 lb 8.5 oz (115 kg)  02/15/23 247 lb 2 oz (112.1 kg)   BMI Readings from Last 3 Encounters:  03/29/23 43.75 kg/m  03/22/23 44.91 kg/m  02/15/23 43.78 kg/m      New complaints: - has lesion in her arm. - says she feels like she has excess fuid. Legs are swolllen at night.  No Known Allergies Outpatient Encounter Medications as of 03/29/2023  Medication Sig   acetaminophen (TYLENOL) 500 MG tablet Take 1,000 mg by mouth every 8 (eight) hours as needed for moderate pain.   albuterol (VENTOLIN HFA) 108 (90 Base) MCG/ACT inhaler Inhale 2 puffs into the lungs every 6 (six) hours as needed for wheezing or shortness of breath.   amLODipine (NORVASC) 5 MG tablet Take 1 tablet (5 mg total) by mouth daily.   apixaban (ELIQUIS) 5 MG TABS tablet Take 1 tablet (5 mg total) by mouth 2 (two) times daily.   blood glucose meter kit and supplies Dispense based on patient and insurance preference. Use up  to four times daily as directed. (FOR ICD-10 E10.9, E11.9).   Cholecalciferol (VITAMIN D3) 50 MCG (2000 UT) capsule Take 1 capsule (2,000 Units total) by mouth daily.   Dulaglutide (TRULICITY) 4.5 MG/0.5ML SOPN INJECT 4.5 MG SUBCUTANEOUSLY ONCE A WEEK   lisinopril-hydrochlorothiazide (ZESTORETIC) 20-25 MG tablet Take 1 tablet by mouth daily.   lubiprostone (AMITIZA) 24 MCG capsule Take 24 mcg by mouth 2 (two) times daily.   meclizine (ANTIVERT) 12.5 MG tablet Take 1 tablet (12.5 mg total) by mouth 3 (three) times daily as needed for dizziness.   omeprazole (PRILOSEC) 40 MG capsule Take 1 capsule (40 mg total) by mouth daily.   rosuvastatin (CRESTOR) 10 MG tablet Take 1  tablet (10 mg total) by mouth daily.   Semaglutide (RYBELSUS) 3 MG TABS Take 3 mg by mouth daily. Dr. Jovita Gamma samples   No facility-administered encounter medications on file as of 03/29/2023.    Past Surgical History:  Procedure Laterality Date   BALLOON DILATION N/A 02/22/2022   Procedure: BALLOON DILATION;  Surgeon: Lanelle Bal, DO;  Location[facility];  Service: Endoscopy;  Laterality: N/A;   BIOPSY  02/22/2022   Procedure: BIOPSY;  Surgeon: Lanelle Bal, DO;  [facility];  Service: Endoscopy;;   CARDIOVERSION N/A 03/18/2021   Procedure: CARDIOVERSION;  Surgeon: Jonelle Sidle, MD;  Location: [facility];  Service: Cardiovascular;  Laterality: N/A;   CESAREAN SECTION     times 2   COLONOSCOPY WITH PROPOFOL N/A 02/22/2022   Procedure: COLONOSCOPY WITH PROPOFOL;  Surgeon: Lanelle Bal, DO;  Location: [facility];  Service: Endoscopy;  Laterality: N/A;  7:30am   CORONARY ANGIOGRAPHY N/A 06/09/2022   Procedure: CORONARY ANGIOGRAPHY;  Surgeon: Orbie Pyo, MD;  Location: [facility];  Service: Cardiovascular;  Laterality: N/A;   ESOPHAGOGASTRODUODENOSCOPY (EGD) WITH PROPOFOL N/A 02/22/2022   Procedure: ESOPHAGOGASTRODUODENOSCOPY (EGD) WITH PROPOFOL;  Surgeon: Lanelle Bal, DO;  [facility]: [facility];  Service: Endoscopy;  Laterality: N/A;   INCISION AND DRAINAGE ABSCESS Right    axilla   LITHOTRIPSY     POLYPECTOMY  02/22/2022   Procedure: POLYPECTOMY;  Surgeon: Lanelle Bal, DO;  [facility]: [facility];  Service: Endoscopy;;    Family History  Problem Relation Age of Onset   Hypertension Mother    Cancer Father    Hypertension Father    Cancer Brother    Heart disease Paternal Grandmother    Breast cancer Neg Hx    Colon cancer Neg Hx       Controlled substance contract: n/a     Review of Systems  Constitutional:  Negative for diaphoresis.  Eyes:  Negative for pain.  Respiratory:  Negative for shortness of breath.    Cardiovascular:  Negative for chest pain, palpitations and leg swelling.  Gastrointestinal:  Negative for abdominal pain.  Endocrine: Negative for polydipsia.  Skin:  Negative for rash.  Neurological:  Negative for dizziness, weakness and headaches.  Hematological:  Does not bruise/bleed easily.  All other systems reviewed and are negative.      Objective:   Physical Exam Vitals and nursing note reviewed.  Constitutional:      General: She is not in acute distress.    Appearance: Normal appearance. She is well-developed.  HENT:     Head: Normocephalic.     Right Ear: Tympanic membrane normal.     Left Ear: Tympanic membrane normal.     Nose: Nose normal.  Mouth/Throat:     Mouth: Mucous membranes are moist.  Eyes:     Pupils: Pupils are equal, round, and reactive to light.  Neck:     Vascular: No carotid bruit or JVD.  Cardiovascular:     Rate and Rhythm: Normal rate and regular rhythm.     Heart sounds: Normal heart sounds.  Pulmonary:     Effort: Pulmonary effort is normal. No respiratory distress.     Breath sounds: Normal breath sounds. No wheezing or rales.  Chest:     Chest wall: No tenderness.  Abdominal:     General: Bowel sounds are normal. There is no distension or abdominal bruit.     Palpations: Abdomen is soft. There is no hepatomegaly, splenomegaly, mass or pulsatile mass.     Tenderness: There is no abdominal tenderness.  Musculoskeletal:        General: Normal range of motion.     Cervical back: Normal range of motion and neck supple.     Right lower leg: Edema (1+) present.     Left lower leg: Edema (1+) present.  Lymphadenopathy:     Cervical: No cervical adenopathy.  Skin:    General: Skin is warm and dry.  Neurological:     Mental Status: She is alert and oriented to person, place, and time.     Deep Tendon Reflexes: Reflexes are normal and symmetric.  Psychiatric:        Behavior: Behavior normal.        Thought Content: Thought content  normal.        Judgment: Judgment normal.       BP 111/74   Pulse 60   Temp 97.7 F (36.5 C) (Temporal)   Resp 20   Ht 5\' 3"  (1.6 m)   Wt 247 lb (112 kg)   SpO2 98%   BMI 43.75 kg/m   Hgba1c 8.0%    Assessment & Plan:  Kathryn Merritt comes in today with chief complaint of Medical Management of Chronic Issues   Diagnosis and orders addressed:  1. Hypertension, unspecified type Stopped lisinopril/hydrochlorothiazide and just do lisinopril Low fat diet - CBC with Differential/Platelet - CMP14+EGFR - lisinopril (ZESTRIL) 20 MG tablet; Take 1 tablet (20 mg total) by mouth daily.  Dispense: 90 tablet; Refill: 3 - amLODipine (NORVASC) 5 MG tablet; Take 1 tablet (5 mg total) by mouth daily.  Dispense: 90 tablet; Refill: 1  2. Paroxysmal atrial fibrillation (HCC) Avoid caffeine [facility]  3. Gastroesophageal reflux disease, unspecified whether esophagitis present Avoid spicy foods Do not eat 2 hours prior to bedtime - omeprazole (PRILOSEC) 40 MG capsule; Take 1 capsule (40 mg total) by mouth daily.  Dispense: 30 capsule; Refill: 1  4. Esophageal dysphagia Chew food well  5. Hyperlipidemia associated with type 2 diabetes mellitus (HCC) Low fat diet - Lipid panel  6. Controlled type 2 diabetes mellitus without complication, without long-term current use of insulin (HCC) Will try to get mounjario back Was given rybellsus 3mg  sample while waiting on mounjario approval. - Bayer DCA Hb A1c Waived  7. Long-term current use of injectable noninsulin antidiabetic medication - Bayer DCA Hb A1c Waived - tirzepatide (MOUNJARO) 5 MG/0.5ML Pen; Inject 5 mg into the skin once a week.  Dispense: 6 mL; Refill: 3  8. Subclinical hypothyroidism Labs pending  9. Stage 3a chronic kidney disease (HCC) Labs pending  10. Vitamin D deficiency continue daily vitamin d supplement  11. OSA (obstructive sleep  apnea) Refuses to wear CPAP  12. Morbid  obesity (HCC) Discussed diet and exercise for person with BMI >25 Will recheck weight in 3-6 months   13. Peripheral edema Elevate legs when sitting - furosemide (LASIX) 20 MG tablet; Take 1 tablet (20 mg total) by mouth daily.  Dispense: 30 tablet; Refill: 3   Labs pending Health Maintenance reviewed Diet and exercise encouraged  Follow up plan: 3 months   Mary-Margaret Daphine Deutscher, FNP

## 2023-03-29 NOTE — Patient Instructions (Signed)
Peripheral Edema  Peripheral edema is swelling that is caused by a buildup of fluid. Peripheral edema most often affects the lower legs, ankles, and feet. It can also develop in the arms, hands, and face. The area of the body that has peripheral edema will look swollen. It may also feel heavy or warm. Your clothes may start to feel tight. Pressing on the area may make a temporary dent in your skin (pitting edema). You may not be able to move your swollen arm or leg as much as usual. There are many causes of peripheral edema. It can happen because of a complication of other conditions such as heart failure, kidney disease, or a problem with your circulation. It also can be a side effect of certain medicines or happen because of an infection. It often happens to women during pregnancy. Sometimes, the cause is not known. Follow these instructions at home: Managing pain, stiffness, and swelling  Raise (elevate) your legs while you are sitting or lying down. Move around often to prevent stiffness and to reduce swelling. Do not sit or stand for long periods of time. Do not wear tight clothing. Do not wear garters on your upper legs. Exercise your legs to get your circulation going. This helps to move the fluid back into your blood vessels, and it may help the swelling go down. Wear compression stockings as told by your health care provider. These stockings help to prevent blood clots and reduce swelling in your legs. It is important that these are the correct size. These stockings should be prescribed by your doctor to prevent possible injuries. If elastic bandages or wraps are recommended, use them as told by your health care provider. Medicines Take over-the-counter and prescription medicines only as told by your health care provider. Your health care provider may prescribe medicine to help your body get rid of excess water (diuretic). Take this medicine if you are told to take it. General  instructions Eat a low-salt (low-sodium) diet as told by your health care provider. Sometimes, eating less salt may reduce swelling. Pay attention to any changes in your symptoms. Moisturize your skin daily to help prevent skin from cracking and draining. Keep all follow-up visits. This is important. Contact a health care provider if: You have a fever. You have swelling in only one leg. You have increased swelling, redness, or pain in one or both of your legs. You have drainage or sores at the area where you have edema. Get help right away if: You have edema that starts suddenly or is getting worse, especially if you are pregnant or have a medical condition. You develop shortness of breath, especially when you are lying down. You have pain in your chest or abdomen. You feel weak. You feel like you will faint. These symptoms may be an emergency. Get help right away. Call 911. Do not wait to see if the symptoms will go away. Do not drive yourself to the hospital. Summary Peripheral edema is swelling that is caused by a buildup of fluid. Peripheral edema most often affects the lower legs, ankles, and feet. Move around often to prevent stiffness and to reduce swelling. Do not sit or stand for long periods of time. Pay attention to any changes in your symptoms. Contact a health care provider if you have edema that starts suddenly or is getting worse, especially if you are pregnant or have a medical condition. Get help right away if you develop shortness of breath, especially when lying down.   This information is not intended to replace advice given to you by your health care provider. Make sure you discuss any questions you have with your health care provider. Document Revised: 05/11/2021 Document Reviewed: 05/11/2021 Elsevier Patient Education  2024 Elsevier Inc.  

## 2023-03-30 LAB — CBC WITH DIFFERENTIAL/PLATELET
Basophils Absolute: 0 10*3/uL (ref 0.0–0.2)
Basos: 1 %
Eos: 2 %
Hematocrit: 41 % (ref 34.0–46.6)
Immature Granulocytes: 0 %
Lymphocytes Absolute: 2.3 10*3/uL (ref 0.7–3.1)
MCH: 28.9 pg (ref 26.6–33.0)
Monocytes Absolute: 0.4 10*3/uL (ref 0.1–0.9)
Platelets: 209 10*3/uL (ref 150–450)
RBC: 4.5 x10E6/uL (ref 3.77–5.28)
RDW: 13.5 % (ref 11.7–15.4)
WBC: 5.9 10*3/uL (ref 3.4–10.8)

## 2023-03-30 LAB — CMP14+EGFR
Bilirubin Total: 0.4 mg/dL (ref 0.0–1.2)
CO2: 22 mmol/L (ref 20–29)
Chloride: 105 mmol/L (ref 96–106)
Creatinine, Ser: 1 mg/dL (ref 0.57–1.00)
Glucose: 176 mg/dL — ABNORMAL HIGH (ref 70–99)

## 2023-03-30 LAB — LIPID PANEL
Cholesterol, Total: 152 mg/dL (ref 100–199)
LDL Chol Calc (NIH): 71 mg/dL (ref 0–99)
VLDL Cholesterol Cal: 24 mg/dL (ref 5–40)

## 2023-03-31 ENCOUNTER — Other Ambulatory Visit: Payer: Self-pay | Admitting: Pharmacist

## 2023-03-31 NOTE — Progress Notes (Signed)
   03/31/2023 Name: Kathryn Merritt MRN: [id number] DOB: 12-Jan-1961  Greggory Keen PRIOR AUTHORIZATION    Medication Access  [facility] prior authorization requested by PCP Patient is unable to get Trulicity due to national backorder She has tried [employer] and Rybelsus Will submit PA via cover my meds platform (clinic notes attached) Denies personal and family history of Medullary thyroid cancer (MTC)      **this is not a billable encounter**  Kieth Brightly, PharmD, BCACP Clinical Pharmacist, [facility]

## 2023-04-04 MED ORDER — FUROSEMIDE 40 MG PO TABS: 40.0000 mg | ORAL_TABLET | Freq: Every day | ORAL | 3 refills | Status: DC

## 2023-04-04 NOTE — Telephone Encounter (Signed)
Lasix increased to 40mg  daily  Meds ordered this encounter  Medications   furosemide (LASIX) 40 MG tablet    Sig: Take 1 tablet (40 mg total) by mouth daily.    Dispense:  30 tablet    Refill:  3    Order Specific Question:   Supervising Provider    Answer:   Nils Pyle [[id number]]   Mary-Margaret Daphine Deutscher, FNP

## 2023-04-08 NOTE — Telephone Encounter (Signed)
Lets wait and see for now- not increase lasix unless we have to- is she elevating legs when sitting?

## 2023-04-11 ENCOUNTER — Telehealth: Payer: Self-pay

## 2023-04-11 NOTE — Telephone Encounter (Signed)
Needs  to wear compression socks and elevate legs when sitting. Needs to be seen for labs if continues

## 2023-04-11 NOTE — Telephone Encounter (Signed)
Pharmacy Patient Advocate Encounter  Received notification from [employer] that Prior Authorization for [facility] 5mg /0.59ml injection has been DENIED because see below..   PA #/Case ID/Reference #: [id number]   Please be advised we currently do not have a Pharmacist to review denials, therefore you will need to process appeals accordingly as needed. Thanks for your support at this time. Contact for appeals are as follows: Phone: [phone], Fax: [phone]  DENIAL LETTER ATTACHED IN MEDIA

## 2023-04-14 ENCOUNTER — Ambulatory Visit (INDEPENDENT_AMBULATORY_CARE_PROVIDER_SITE_OTHER): Payer: Commercial Managed Care - HMO | Admitting: Nurse Practitioner

## 2023-04-14 ENCOUNTER — Encounter: Payer: Self-pay | Admitting: Nurse Practitioner

## 2023-04-14 VITALS — BP 125/71 | HR 60 | Temp 97.8°F | Ht 63.0 in | Wt 252.8 lb

## 2023-04-14 DIAGNOSIS — R600 Localized edema: Secondary | ICD-10-CM | POA: Diagnosis not present

## 2023-04-14 LAB — BMP8+EGFR
BUN/Creatinine Ratio: 12 (ref 12–28)
BUN: 11 mg/dL (ref 8–27)
CO2: 24 mmol/L (ref 20–29)
Calcium: 9.8 mg/dL (ref 8.7–10.3)
Chloride: 108 mmol/L — ABNORMAL HIGH (ref 96–106)
Creatinine, Ser: 0.91 mg/dL (ref 0.57–1.00)
Glucose: 149 mg/dL — ABNORMAL HIGH (ref 70–99)
Potassium: 4 mmol/L (ref 3.5–5.2)
Sodium: 144 mmol/L (ref 134–144)
eGFR: 72 mL/min/{1.73_m2} (ref >59)

## 2023-04-14 LAB — BRAIN NATRIURETIC PEPTIDE

## 2023-04-14 MED ORDER — TORSEMIDE 100 MG PO TABS: 100.0000 mg | ORAL_TABLET | Freq: Every day | ORAL | 1 refills | Status: DC

## 2023-04-14 NOTE — Progress Notes (Signed)
Subjective:    Patient ID: Kathryn Merritt, female    DOB: 1961/05/21, 62 y.o.   MRN: [id number]   Chief Complaint: Foot Swelling and Facial Swelling (Swelling legs, swelling in abdomen )   HPI  Patient come sin again c/o swelling. We have put he ron lasix which has helped some. She says mainly her legs. Bu she feels like she is swelling all over. Feels SOB of breath if she does to much activity. On lasix 40 mg which does not make her go to the restroom any more then usual.  SHe does have Afib and goes to afib clinic. Last cardiology visit was in sept '23. Patient Active Problem List   Diagnosis Date Noted   Long-term current use of injectable noninsulin antidiabetic medication 03/29/2023   Hypercoagulable state due to paroxysmal atrial fibrillation (HCC) 03/29/2023   Lesion of left ear 06/24/2022   Vitamin D deficiency 01/21/2022   Stage 3a chronic kidney disease (HCC) 01/21/2022   Esophageal dysphagia 01/13/2022   Subclinical hypothyroidism 10/23/2021   Gastroesophageal reflux disease 07/16/2021   OSA (obstructive sleep apnea) 07/14/2021   Hypertension 06/18/2021   Hyperlipidemia associated with type 2 diabetes mellitus (HCC) 06/18/2021   Paroxysmal atrial fibrillation (HCC) 03/18/2021   Morbid obesity (HCC) 03/18/2021   Controlled type 2 diabetes mellitus without complication, without long-term current use of insulin (HCC) 03/18/2021       Review of Systems  Constitutional:  Negative for diaphoresis.  Eyes:  Negative for pain.  Respiratory:  Negative for shortness of breath.   Cardiovascular:  Negative for chest pain, palpitations and leg swelling.  Gastrointestinal:  Negative for abdominal pain.  Endocrine: Negative for polydipsia.  Skin:  Negative for rash.  Neurological:  Negative for dizziness, weakness and headaches.  Hematological:  Does not bruise/bleed easily.  All other systems reviewed and are negative.      Objective:   Physical Exam Vitals and nursing  note reviewed.  Constitutional:      General: She is not in acute distress.    Appearance: Normal appearance. She is well-developed.  Neck:     Vascular: No carotid bruit or JVD.  Cardiovascular:     Rate and Rhythm: Normal rate and regular rhythm.     Heart sounds: Normal heart sounds.  Pulmonary:     Effort: Pulmonary effort is normal. No respiratory distress.     Breath sounds: Normal breath sounds. No wheezing or rales.  Chest:     Chest wall: No tenderness.  Abdominal:     General: Bowel sounds are normal. There is no distension or abdominal bruit.     Palpations: Abdomen is soft. There is no hepatomegaly, splenomegaly, mass or pulsatile mass.     Tenderness: There is no abdominal tenderness.  Musculoskeletal:        General: Normal range of motion.     Cervical back: Normal range of motion and neck supple.  Lymphadenopathy:     Cervical: No cervical adenopathy.  Skin:    General: Skin is warm and dry.  Neurological:     Mental Status: She is alert and oriented to person, place, and time.     Deep Tendon Reflexes: Reflexes are normal and symmetric.  Psychiatric:        Behavior: Behavior normal.        Thought Content: Thought content normal.        Judgment: Judgment normal.    BP 125/71   Pulse 60   Temp 97.8  F (36.6 C) (Temporal)   Ht 5\' 3"  (1.6 m)   Wt 252 lb 12.8 oz (114.7 kg)   SpO2 99%   BMI 44.78 kg/m         Assessment & Plan:   Kathryn Merritt in today with chief complaint of Foot Swelling and Facial Swelling (Swelling legs, swelling in abdomen )   1. Peripheral edema Elevate legs when sitting Switch from furosemide to torsemide - Brain natriuretic peptide - BMP8+EGFR    The above assessment and management plan was discussed with the patient. The patient verbalized understanding of and has agreed to the management plan. Patient is aware to call the clinic if symptoms persist or worsen. Patient is aware when to return to the clinic for a  follow-up visit. Patient educated on when it is appropriate to go to the emergency department.   Mary-Margaret Daphine Deutscher, FNP

## 2023-04-14 NOTE — Telephone Encounter (Signed)
Resubmitted PA

## 2023-04-14 NOTE — Patient Instructions (Signed)
Peripheral Edema  Peripheral edema is swelling that is caused by a buildup of fluid. Peripheral edema most often affects the lower legs, ankles, and feet. It can also develop in the arms, hands, and face. The area of the body that has peripheral edema will look swollen. It may also feel heavy or warm. Your clothes may start to feel tight. Pressing on the area may make a temporary dent in your skin (pitting edema). You may not be able to move your swollen arm or leg as much as usual. There are many causes of peripheral edema. It can happen because of a complication of other conditions such as heart failure, kidney disease, or a problem with your circulation. It also can be a side effect of certain medicines or happen because of an infection. It often happens to women during pregnancy. Sometimes, the cause is not known. Follow these instructions at home: Managing pain, stiffness, and swelling  Raise (elevate) your legs while you are sitting or lying down. Move around often to prevent stiffness and to reduce swelling. Do not sit or stand for long periods of time. Do not wear tight clothing. Do not wear garters on your upper legs. Exercise your legs to get your circulation going. This helps to move the fluid back into your blood vessels, and it may help the swelling go down. Wear compression stockings as told by your health care provider. These stockings help to prevent blood clots and reduce swelling in your legs. It is important that these are the correct size. These stockings should be prescribed by your doctor to prevent possible injuries. If elastic bandages or wraps are recommended, use them as told by your health care provider. Medicines Take over-the-counter and prescription medicines only as told by your health care provider. Your health care provider may prescribe medicine to help your body get rid of excess water (diuretic). Take this medicine if you are told to take it. General  instructions Eat a low-salt (low-sodium) diet as told by your health care provider. Sometimes, eating less salt may reduce swelling. Pay attention to any changes in your symptoms. Moisturize your skin daily to help prevent skin from cracking and draining. Keep all follow-up visits. This is important. Contact a health care provider if: You have a fever. You have swelling in only one leg. You have increased swelling, redness, or pain in one or both of your legs. You have drainage or sores at the area where you have edema. Get help right away if: You have edema that starts suddenly or is getting worse, especially if you are pregnant or have a medical condition. You develop shortness of breath, especially when you are lying down. You have pain in your chest or abdomen. You feel weak. You feel like you will faint. These symptoms may be an emergency. Get help right away. Call 911. Do not wait to see if the symptoms will go away. Do not drive yourself to the hospital. Summary Peripheral edema is swelling that is caused by a buildup of fluid. Peripheral edema most often affects the lower legs, ankles, and feet. Move around often to prevent stiffness and to reduce swelling. Do not sit or stand for long periods of time. Pay attention to any changes in your symptoms. Contact a health care provider if you have edema that starts suddenly or is getting worse, especially if you are pregnant or have a medical condition. Get help right away if you develop shortness of breath, especially when lying down.   This information is not intended to replace advice given to you by your health care provider. Make sure you discuss any questions you have with your health care provider. Document Revised: 05/11/2021 Document Reviewed: 05/11/2021 Elsevier Patient Education  2024 Elsevier Inc.  

## 2023-05-05 ENCOUNTER — Other Ambulatory Visit: Payer: Self-pay | Admitting: Nurse Practitioner

## 2023-05-05 DIAGNOSIS — K219 Gastro-esophageal reflux disease without esophagitis: Secondary | ICD-10-CM

## 2023-05-07 ENCOUNTER — Other Ambulatory Visit: Payer: Self-pay | Admitting: Nurse Practitioner

## 2023-05-24 ENCOUNTER — Telehealth: Payer: Self-pay | Admitting: Nurse Practitioner

## 2023-05-24 NOTE — Telephone Encounter (Signed)
Patient return call. ?

## 2023-05-24 NOTE — Telephone Encounter (Signed)
Patient concerned that she would need an appt for labs when she comes to see MMM. Advised patient that we would be able to do all labs when she comes for her appt. Patient verbalized understanding

## 2023-05-30 ENCOUNTER — Encounter: Payer: Self-pay | Admitting: Nurse Practitioner

## 2023-05-30 ENCOUNTER — Ambulatory Visit (INDEPENDENT_AMBULATORY_CARE_PROVIDER_SITE_OTHER): Payer: Commercial Managed Care - HMO | Admitting: Nurse Practitioner

## 2023-05-30 VITALS — BP 138/81 | HR 51 | Temp 96.7°F | Resp 20 | Ht 63.0 in | Wt 259.0 lb

## 2023-05-30 DIAGNOSIS — R350 Frequency of micturition: Secondary | ICD-10-CM

## 2023-05-30 DIAGNOSIS — Z7985 Long-term (current) use of injectable non-insulin antidiabetic drugs: Secondary | ICD-10-CM

## 2023-05-30 DIAGNOSIS — E119 Type 2 diabetes mellitus without complications: Secondary | ICD-10-CM | POA: Diagnosis not present

## 2023-05-30 DIAGNOSIS — Z7984 Long term (current) use of oral hypoglycemic drugs: Secondary | ICD-10-CM | POA: Diagnosis not present

## 2023-05-30 LAB — URINALYSIS, COMPLETE
Bilirubin, UA: NEGATIVE
Ketones, UA: NEGATIVE
Leukocytes,UA: NEGATIVE
Nitrite, UA: NEGATIVE
RBC, UA: NEGATIVE
Specific Gravity, UA: 1.015 (ref 1.005–1.030)
Urobilinogen, Ur: 0.2 mg/dL (ref 0.2–1.0)
pH, UA: 5.5 (ref 5.0–7.5)

## 2023-05-30 LAB — BAYER DCA HB A1C WAIVED: HB A1C (BAYER DCA - WAIVED): 9.1 % — ABNORMAL HIGH (ref 4.8–5.6)

## 2023-05-30 LAB — MICROSCOPIC EXAMINATION
RBC, Urine: NONE SEEN /HPF (ref 0–2)
Renal Epithel, UA: NONE SEEN /HPF
Yeast, UA: NONE SEEN

## 2023-05-30 NOTE — Progress Notes (Signed)
Subjective:    Patient ID: Kathryn Merritt, female    DOB: 03-14-1961, 62 y.o.   MRN: [id number]   Chief Complaint: Blood sugars are high   HPI  Patient comes in c/o elevated blood sugars. She has been out of rybellsus for 3 week as and also has not had her trulicity. Her blood sugars have been ranging from 250-500 at times. We have been giving her samples of rybellsus but we do not have any currently. Trulicity is on back order so she has not been abel to get it.   Lab Results  Component Value Date   HGBA1C 8.0 (H) 03/29/2023    Patient Active Problem List   Diagnosis Date Noted   Long-term current use of injectable noninsulin antidiabetic medication 03/29/2023   Hypercoagulable state due to paroxysmal atrial fibrillation (HCC) 03/29/2023   Lesion of left ear 06/24/2022   Vitamin D deficiency 01/21/2022   Stage 3a chronic kidney disease (HCC) 01/21/2022   Esophageal dysphagia 01/13/2022   Subclinical hypothyroidism 10/23/2021   Gastroesophageal reflux disease 07/16/2021   OSA (obstructive sleep apnea) 07/14/2021   Hypertension 06/18/2021   Hyperlipidemia associated with type 2 diabetes mellitus (HCC) 06/18/2021   Paroxysmal atrial fibrillation (HCC) 03/18/2021   Morbid obesity (HCC) 03/18/2021   Controlled type 2 diabetes mellitus without complication, without long-term current use of insulin (HCC) 03/18/2021       Review of Systems  Constitutional:  Negative for diaphoresis.  Eyes:  Negative for pain.  Respiratory:  Negative for shortness of breath.   Cardiovascular:  Negative for chest pain, palpitations and leg swelling.  Gastrointestinal:  Negative for abdominal pain.  Endocrine: Negative for polydipsia.  Skin:  Negative for rash.  Neurological:  Negative for dizziness, weakness and headaches.  Hematological:  Does not bruise/bleed easily.  All other systems reviewed and are negative.      Objective:   Physical Exam Constitutional:      Appearance: Normal  appearance. She is obese.  Cardiovascular:     Rate and Rhythm: Normal rate and regular rhythm.     Heart sounds: Normal heart sounds.  Pulmonary:     Breath sounds: Normal breath sounds.  Skin:    General: Skin is warm.  Neurological:     General: No focal deficit present.     Mental Status: She is alert and oriented to person, place, and time.  Psychiatric:        Mood and Affect: Mood normal.        Behavior: Behavior normal.     BP 138/81   Pulse (!) 51   Temp (!) 96.7 F (35.9 C) (Temporal)   Resp 20   Ht 5\' 3"  (1.6 m)   Wt 259 lb (117.5 kg)   SpO2 98%   BMI 45.88 kg/m   Hgba1c 9.1%     Assessment & Plan:   Kathryn Merritt in today with chief complaint of Blood sugars are high   1. Type 2 diabetes mellitus without complication, without long-term current use of insulin (HCC) Sample of ozempic given to patient - first shot given in office Strict carb counting Follow up in 1 month with blood sugar diary - Bayer DCA Hb A1c Waived  2. Frequent urination Due to increased blood sugars - Urinalysis, Complete    The above assessment and management plan was discussed with the patient. The patient verbalized understanding of and has agreed to the management plan. Patient is aware to call the clinic if  symptoms persist or worsen. Patient is aware when to return to the clinic for a follow-up visit. Patient educated on when it is appropriate to go to the emergency department.   Mary-Margaret Daphine Deutscher, FNP

## 2023-05-30 NOTE — Patient Instructions (Signed)

## 2023-06-06 ENCOUNTER — Ambulatory Visit: Payer: Commercial Managed Care - HMO | Admitting: Nurse Practitioner

## 2023-06-21 ENCOUNTER — Encounter: Payer: Self-pay | Admitting: Nurse Practitioner

## 2023-06-21 ENCOUNTER — Ambulatory Visit: Payer: Managed Care, Other (non HMO) | Admitting: Nurse Practitioner

## 2023-06-21 VITALS — BP 136/81 | HR 56 | Temp 97.8°F | Resp 20 | Ht 63.0 in | Wt 257.0 lb

## 2023-06-21 DIAGNOSIS — M791 Myalgia, unspecified site: Secondary | ICD-10-CM

## 2023-06-21 DIAGNOSIS — Z23 Encounter for immunization: Secondary | ICD-10-CM

## 2023-06-21 MED ORDER — SEMAGLUTIDE(0.25 OR 0.5MG/DOS) 2 MG/3ML ~~LOC~~ SOPN: 0.5000 mg | PEN_INJECTOR | SUBCUTANEOUS | 2 refills | Status: DC

## 2023-06-21 MED ORDER — METHYLPREDNISOLONE ACETATE 80 MG/ML IJ SUSP
80.0000 mg | Freq: Once | INTRAMUSCULAR | Status: AC
Start: 2023-06-21 — End: 2023-06-21
  Administered 2023-06-21: 80 mg via INTRAMUSCULAR

## 2023-06-21 MED ORDER — KETOROLAC TROMETHAMINE 60 MG/2ML IM SOLN
60.0000 mg | Freq: Once | INTRAMUSCULAR | Status: AC
Start: 2023-06-21 — End: 2023-06-21
  Administered 2023-06-21: 60 mg via INTRAMUSCULAR

## 2023-06-21 NOTE — Patient Instructions (Signed)
Muscle Pain, Adult Muscle pain, also called myalgia, is a condition in which a person has pain in one or more muscles in the body. The pain may be mild, moderate, or severe. It may feel sharp, achy, or burning. In most cases, the pain lasts only a short time and goes away on its own. It is normal to feel some muscle pain after you start a new exercise program. Muscles that have not been used a lot will be sore at first. What are the causes? You may have muscle pain when you use your muscles in a new or different way after not having used them for some time. Muscle pain can also be caused by overuse or by stretching a muscle beyond its normal length (muscle strain). You may be more likely to have muscle pain if you are not in shape. Other causes may include: Injury or bruising. Infectious diseases. These include diseases caused by viruses, such as the flu (influenza). Fibromyalgia. This is a long-term (chronic) condition that causes muscle tenderness, tiredness (fatigue), and headache. Autoimmune or rheumatologic diseases. These are conditions, such as lupus, that cause the body's defense system (immune system) to attack areas in the body. Certain medicines. These include ACE inhibitors and statins. What are the signs or symptoms? The main symptom is sore or painful muscles. Your muscles may be sore when you do activities and when you stretch. You may also have slight swelling. How is this diagnosed? Muscle pain is diagnosed with a physical exam. Your health care provider will ask questions about your pain and when it began. If you have not had muscle pain for very long, your provider may want to wait before doing much testing. If your muscle pain has lasted a long time, tests may be done right away. In some cases, you may need tests to rule out other conditions and diseases. How is this treated? Treatment for muscle pain depends on the cause. Home care is usually enough to relieve the pain. Your  provider may also prescribe NSAIDs, such as ibuprofen. Follow these instructions at home: Medicines Take over-the-counter and prescription medicines only as told by your provider. Ask your health care provider if the medicine prescribed to you requires you to avoid driving or using machinery. Managing pain, swelling, and discomfort     If told, put ice on the painful area for the first 2 days of soreness. Put ice in a plastic bag. Place a towel between your skin and the bag. Leave the ice on for 20 minutes, 2-3 times a day. If your skin turns bright red, remove the ice right away to prevent skin damage. The risk of damage is higher if you cannot feel pain, heat, or cold. For the first 2 days of muscle soreness, or if there is swelling: Do not soak in hot baths. Do not use a hot tub, steam room, sauna, heating pad, or other heat source. After 2-3 days, you may switch between putting ice and heat on the area. If told, apply heat to the affected area as often as told by your provider. Use the heat source that your recommends, such as a moist heat pack or a heating pad. Place a towel between your skin and the heat source. Leave the heat on for 20-30 minutes. If your skin turns bright red, remove the ice or heat right away to prevent skin damage. The risk of damage is higher if you cannot feel pain, heat, or cold. If you have an injury,   raise (elevate) the injured area above the level of your heart while you are sitting or lying down. Activity  If your muscle pain is caused by overuse: Slow down your activities until the pain goes away. Do regular, gentle exercises if you are not normally active. Warm up before you exercise. Stretch before and after you exercise. This can help lower the risk of muscle pain. Do not keep working out if the pain is severe. Severe pain could mean that you have injured a muscle. You may have to avoid lifting. Ask your provider how much you can safely lift. Return  to your normal activities as told by your provider. Ask your provider what activities are safe for you. General instructions Do not use any products that contain nicotine or tobacco. These products include cigarettes, chewing tobacco, and vaping devices, such as e-cigarettes. If you need help quitting, ask your provider. Contact a health care provider if: Your muscle pain gets worse, and medicines do not help. The muscle pain lasts longer than 3 days. You have a rash or fever. You have muscle pain after a tick bite. You have muscle pain when you work out, even though you are in good shape. You have redness, soreness, or swelling. You have muscle pain after you start a new medicine or change the dose of a medicine. Get help right away if: You have trouble breathing. You have trouble swallowing. You have muscle pain along with a stiff neck, fever, and vomiting. You have severe muscle weakness, or you cannot move part of your body. You are urinating less, or you have dark, bloody, or discolored urine. You have redness or swelling at the site of the muscle pain. These symptoms may be an emergency. Get help right away. Call 911. Do not wait to see if the symptoms will go away. Do not drive yourself to the hospital. This information is not intended to replace advice given to you by your health care provider. Make sure you discuss any questions you have with your health care provider. Document Revised: 04/16/2022 Document Reviewed: 04/16/2022 Elsevier Patient Education  2024 Elsevier Inc.  

## 2023-06-21 NOTE — Progress Notes (Signed)
Subjective:    Patient ID: Kathryn Merritt, female    DOB: [birth year], 62 y.o.   MRN: [id number]  Chief Complaint: Back Pain and Joint pain all over   Back Pain This is a new problem. The current episode started 1 to 4 weeks ago. The problem occurs intermittently. The problem has been waxing and waning since onset. The pain radiates to the right thigh. The pain is at a severity of 6/10. The pain is moderate. The pain is Worse during the day.   She says pain can be anywhere in her body. Not always in her back. Painful to get up and down. Has been going on for quite some time but seems  to be worsening.  She has only used tylenol OTC. Toradol and steroids really help.   Patient Active Problem List   Diagnosis Date Noted   Long-term current use of injectable noninsulin antidiabetic medication 03/29/2023   Hypercoagulable state due to paroxysmal atrial fibrillation (HCC) 03/29/2023   Lesion of left ear 06/24/2022   Vitamin D deficiency 01/21/2022   Stage 3a chronic kidney disease (HCC) 01/21/2022   Esophageal dysphagia 01/13/2022   Subclinical hypothyroidism 10/23/2021   Gastroesophageal reflux disease 07/16/2021   OSA (obstructive sleep apnea) 07/14/2021   Hypertension 06/18/2021   Hyperlipidemia associated with type 2 diabetes mellitus (HCC) 06/18/2021   Paroxysmal atrial fibrillation (HCC) 03/18/2021   Morbid obesity (HCC) 03/18/2021   Controlled type 2 diabetes mellitus without complication, without long-term current use of insulin (HCC) 03/18/2021       Review of Systems  Musculoskeletal:  Positive for back pain and myalgias.       Objective:   Physical Exam Constitutional:      Appearance: Normal appearance. She is obese.  Cardiovascular:     Rate and Rhythm: Normal rate and regular rhythm.     Heart sounds: Normal heart sounds.  Pulmonary:     Effort: Pulmonary effort is normal.     Breath sounds: Normal breath sounds.  Musculoskeletal:     Comments: Rises slowly  from sitting  to standing  Skin:    General: Skin is warm.  Neurological:     General: No focal deficit present.     Mental Status: She is alert.  Psychiatric:        Mood and Affect: Mood normal.        Behavior: Behavior normal.     BP 136/81   Pulse (!) 56   Temp 97.8 F (36.6 C) (Temporal)   Resp 20   Ht 5\' 3"  (1.6 m)   Wt 257 lb (116.6 kg)   SpO2 97%   BMI 45.53 kg/m        Assessment & Plan:   Kathryn Merritt in today with chief complaint of Back Pain and Joint pain all over   1. Myalgia Moist heat TENS unit Exercise to keep muscle warm - methylPREDNISolone acetate (DEPO-MEDROL) injection 80 mg - ketorolac (TORADOL) injection 60 mg    The above assessment and management plan was discussed with the patient. The patient verbalized understanding of and has agreed to the management plan. Patient is aware to call the clinic if symptoms persist or worsen. Patient is aware when to return to the clinic for a follow-up visit. Patient educated on when it is appropriate to go to the emergency department.   Mary-Margaret Daphine Deutscher, FNP

## 2023-06-23 ENCOUNTER — Telehealth: Payer: Self-pay

## 2023-06-23 ENCOUNTER — Other Ambulatory Visit (HOSPITAL_COMMUNITY): Payer: Self-pay

## 2023-06-23 NOTE — Telephone Encounter (Signed)
Pharmacy Patient Advocate Encounter   Received notification from Physician's Office that prior authorization for Ozempic is required/requested.   Insurance verification completed.   The patient is insured through [employer] .   Per test claim: PA required; PA submitted to [employer] via CoverMyMeds Key/confirmation #/EOC (Key: [id number])   Status is pending

## 2023-06-24 ENCOUNTER — Other Ambulatory Visit (HOSPITAL_COMMUNITY): Payer: Self-pay

## 2023-06-24 NOTE — Telephone Encounter (Signed)
Pharmacy Patient Advocate Encounter  Received notification from [employer] that Prior Authorization for Ozempic has been APPROVED from 10.3.24 to 10.3.25. Ran test claim, and the Rx has last been filled on 10.03.24.     This test claim was processed through [facility]- copay amounts may vary at other pharmacies due to pharmacy/plan contracts, or as the patient moves through the different stages of their insurance plan.   PA #/Case ID/Reference #: (Key: [id number])

## 2023-07-14 ENCOUNTER — Ambulatory Visit: Payer: Commercial Managed Care - HMO | Admitting: Nurse Practitioner

## 2023-07-18 ENCOUNTER — Other Ambulatory Visit: Payer: Self-pay | Admitting: Nurse Practitioner

## 2023-07-18 DIAGNOSIS — K219 Gastro-esophageal reflux disease without esophagitis: Secondary | ICD-10-CM

## 2023-07-20 ENCOUNTER — Telehealth: Payer: Managed Care, Other (non HMO) | Admitting: Nurse Practitioner

## 2023-07-20 ENCOUNTER — Telehealth: Payer: Self-pay | Admitting: Emergency Medicine

## 2023-07-20 ENCOUNTER — Ambulatory Visit
Admission: EM | Admit: 2023-07-20 | Discharge: 2023-07-20 | Disposition: A | Discharge status: Home / Self Care | Payer: Managed Care, Other (non HMO) | Attending: Nurse Practitioner | Admitting: Nurse Practitioner

## 2023-07-20 ENCOUNTER — Encounter: Payer: Self-pay | Admitting: Nurse Practitioner

## 2023-07-20 DIAGNOSIS — J069 Acute upper respiratory infection, unspecified: Secondary | ICD-10-CM

## 2023-07-20 DIAGNOSIS — Z1152 Encounter for screening for COVID-19: Secondary | ICD-10-CM | POA: Insufficient documentation

## 2023-07-20 DIAGNOSIS — R0981 Nasal congestion: Secondary | ICD-10-CM

## 2023-07-20 LAB — POCT RAPID STREP A (OFFICE): Rapid Strep A Screen: NEGATIVE

## 2023-07-20 MED ORDER — PSEUDOEPH-BROMPHEN-DM 30-2-10 MG/5ML PO SYRP: 5.0000 mL | ORAL_SOLUTION | Freq: Four times a day (QID) | ORAL | 0 refills | Status: DC | PRN

## 2023-07-20 MED ORDER — FLUTICASONE PROPIONATE 50 MCG/ACT NA SUSP
2.0000 | Freq: Every day | NASAL | 1 refills | Status: DC
Start: 2023-07-20 — End: 2024-03-12

## 2023-07-20 MED ORDER — CETIRIZINE HCL 10 MG PO TABS: 10.0000 mg | ORAL_TABLET | Freq: Every day | ORAL | 0 refills | Status: AC

## 2023-07-20 NOTE — Discharge Instructions (Addendum)
The rapid strep test was negative.  A throat culture and COVID test are pending.  You will be contacted if the pending test results are abnormal.  If you begin treatment for COVID, you will need to hold your Crestor for 2 weeks. Take medication as prescribed.  Please pick up over-the-counter cetirizine (Zyrtec) to take daily.  I attempted to send a prescription, but it will be much cheaper for you to purchase the medication over-the-counter. Increase fluids and allow for plenty of rest. May take over-the-counter Tylenol as needed for pain, fever, or general discomfort. Recommend normal saline nasal spray throughout the day to help with nasal congestion and runny nose. Warm salt water gargles 3-4 times daily as needed for throat pain or discomfort. Recommend a soft diet to include soup, broth, yogurt, pudding, or Jell-O while throat pain persist. For your cough, recommend using a humidifier in your bedroom at nighttime during sleep and sleeping slightly elevated on pillows while cough symptoms persist. As discussed, if symptoms do not improve over the next 7 to 10 days, or if symptoms suddenly worsen, you may follow-up in this clinic or with your primary care physician for further evaluation. Follow-up as needed.

## 2023-07-20 NOTE — ED Provider Notes (Signed)
[facility]    CSN: [id number] Arrival date & time: 07/20/23  1503      History   Chief Complaint No chief complaint on file.   HPI Kathryn Merritt is a 62 y.o. female.   The history is provided by the patient.   Patient presents for complaints of headache, nasal congestion, sore throat, ear pain, and cough is been present for the past 4 days.  Patient denies fever, chills, ear drainage, wheezing, difficulty breathing, chest pain, abdominal pain, nausea, vomiting, diarrhea, or rash.  Patient denies underlying history of seasonal allergies.  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  Patient reports she took Advil Cold and Sinus for her symptoms last evening.  Past Medical History:  Diagnosis Date   Chronic back pain    Diabetes mellitus    Edema leg    Hyperlipidemia    Hypertension    Joint pain    Kidney stones    Other malaise and fatigue    PAF (paroxysmal atrial fibrillation) (HCC)    a. new diagnosis in 02/2021 --> s/p DCCV on 03/18/2021   RBBB    Sinus bradycardia    Sleep apnea     Patient Active Problem List   Diagnosis Date Noted   Nasal congestion 07/20/2023   Viral URI 07/20/2023   Long-term current use of injectable noninsulin antidiabetic medication 03/29/2023   Hypercoagulable state due to paroxysmal atrial fibrillation (HCC) 03/29/2023   Lesion of left ear 06/24/2022   Vitamin D deficiency 01/21/2022   Stage 3a chronic kidney disease (HCC) 01/21/2022   Esophageal dysphagia 01/13/2022   Subclinical hypothyroidism 10/23/2021   Gastroesophageal reflux disease 07/16/2021   OSA (obstructive sleep apnea) 07/14/2021   Hypertension 06/18/2021   Hyperlipidemia associated with type 2 diabetes mellitus (HCC) 06/18/2021   Paroxysmal atrial fibrillation (HCC) 03/18/2021   Morbid obesity (HCC) 03/18/2021   Controlled type 2 diabetes mellitus without complication, without long-term current use of insulin (HCC) 03/18/2021    Past  Surgical History:  Procedure Laterality Date   BALLOON DILATION N/A 02/22/2022   Procedure: BALLOON DILATION;  Surgeon: Lanelle Bal, DO;  Location: [facility];  Service: Endoscopy;  Laterality: N/A;   BIOPSY  02/22/2022   Procedure: BIOPSY;  Surgeon: Lanelle Bal, DO;  Location: [facility];  Service: Endoscopy;;   CARDIOVERSION N/A 03/18/2021   Procedure: CARDIOVERSION;  Surgeon: Jonelle Sidle, MD;  Location: [facility];  Service: Cardiovascular;  Laterality: N/A;   CESAREAN SECTION     times 2   COLONOSCOPY WITH PROPOFOL N/A 02/22/2022   Procedure: COLONOSCOPY WITH PROPOFOL;  Surgeon: Lanelle Bal, DO;  Location: [facility];  Service: Endoscopy;  Laterality: N/A;  7:30am   CORONARY ANGIOGRAPHY N/A 06/09/2022   Procedure: CORONARY ANGIOGRAPHY;  Surgeon: Orbie Pyo, MD;  Location: [facility];  Service: Cardiovascular;  Laterality: N/A;   ESOPHAGOGASTRODUODENOSCOPY (EGD) WITH PROPOFOL N/A 02/22/2022   Procedure: ESOPHAGOGASTRODUODENOSCOPY (EGD) WITH PROPOFOL;  Surgeon: Lanelle Bal, DO;  Location: [facility];  Service: Endoscopy;  Laterality: N/A;   INCISION AND DRAINAGE ABSCESS Right    axilla   LITHOTRIPSY     POLYPECTOMY  02/22/2022   Procedure: POLYPECTOMY;  Surgeon: Lanelle Bal, DO;  Location: [facility];  Service: Endoscopy;;    OB History   No obstetric history on file.      Home Medications    Prior to Admission medications   Medication  Sig Start Date End Date Taking? Authorizing Provider  brompheniramine-pseudoephedrine-DM 30-2-10 MG/5ML syrup Take 5 mLs by mouth 4 (four) times daily as needed. 07/20/23  Yes Leath-Warren, Sadie Haber, NP  acetaminophen (TYLENOL) 500 MG tablet Take 1,000 mg by mouth every 8 (eight) hours as needed for moderate pain.    [provider]  albuterol (VENTOLIN HFA) 108 (90 Base) MCG/ACT inhaler Inhale 2 puffs into the lungs every 6 (six) hours as needed for wheezing or shortness of breath.  12/27/22   Daphine Deutscher, Mary-Margaret, FNP  amLODipine (NORVASC) 5 MG tablet Take 1 tablet (5 mg total) by mouth daily. 03/29/23   Daphine Deutscher Mary-Margaret, FNP  apixaban (ELIQUIS) 5 MG TABS tablet Take 1 tablet (5 mg total) by mouth 2 (two) times daily. 12/23/22   Bennie Pierini, FNP  blood glucose meter kit and supplies Dispense based on patient and insurance preference. Use up to four times daily as directed. (FOR ICD-10 E10.9, E11.9). 10/01/21   Dani Gobble, NP  Cholecalciferol (VITAMIN D3) 50 MCG (2000 UT) capsule Take 1 capsule (2,000 Units total) by mouth daily. 09/27/22   Daryll Drown, NP  fluticasone (FLONASE) 50 MCG/ACT nasal spray Place 2 sprays into both nostrils daily. 07/20/23   St Vena [place], NP  lisinopril (ZESTRIL) 20 MG tablet Take 1 tablet (20 mg total) by mouth daily. 03/29/23   Daphine Deutscher, Mary-Margaret, FNP  lubiprostone (AMITIZA) 24 MCG capsule Take 1 capsule (24 mcg total) by mouth 2 (two) times daily. 07/18/23   Daphine Deutscher, Mary-Margaret, FNP  meclizine (ANTIVERT) 12.5 MG tablet Take 1 tablet (12.5 mg total) by mouth 3 (three) times daily as needed for dizziness. 10/11/22   Gabriel Earing, FNP  omeprazole (PRILOSEC) 40 MG capsule Take 1 capsule (40 mg total) by mouth daily. 07/18/23   Daphine Deutscher, Mary-Margaret, FNP  rosuvastatin (CRESTOR) 10 MG tablet Take 1 tablet (10 mg total) by mouth daily. 12/23/22   Bennie Pierini, FNP  Semaglutide,0.25 or 0.5MG /DOS, 2 MG/3ML SOPN Inject 0.5 mg into the skin once a week. 06/21/23   Daphine Deutscher, Mary-Margaret, FNP  torsemide (DEMADEX) 100 MG tablet Take 1 tablet (100 mg total) by mouth daily. 05/09/23   Bennie Pierini, FNP    Family History Family History  Problem Relation Age of Onset   Hypertension Mother    Cancer Father    Hypertension Father    Cancer Brother    Heart disease Paternal Grandmother    Breast cancer Neg Hx    Colon cancer Neg Hx     Social History Social History   Tobacco Use   Smoking status:  Never   Smokeless tobacco: Never  Vaping Use   Vaping status: Never Used  Substance Use Topics   Alcohol use: No   Drug use: No     Allergies   Metformin and related   Review of Systems Review of Systems Per HPI  Physical Exam Triage Vital Signs ED Triage Vitals  Encounter Vitals Group     BP 07/20/23 1507 (!) 144/80     Systolic BP Percentile --      Diastolic BP Percentile --      Pulse Rate 07/20/23 1507 87     Resp 07/20/23 1507 16     Temp 07/20/23 1507 98 F (36.7 C)     Temp Source 07/20/23 1507 Oral     SpO2 07/20/23 1507 93 %     Weight --      Height --      Head  Circumference --      Peak Flow --      Pain Score 07/20/23 1512 6     Pain Loc --      Pain Education --      Exclude from Growth Chart --    No data found.  Updated Vital Signs BP (!) 144/80 (BP Location: Right Arm)   Pulse 87   Temp 98 F (36.7 C) (Oral)   Resp 16   SpO2 93%   Visual Acuity Right Eye Distance:   Left Eye Distance:   Bilateral Distance:    Right Eye Near:   Left Eye Near:    Bilateral Near:     Physical Exam Vitals and nursing note reviewed.  Constitutional:      General: She is not in acute distress.    Appearance: Normal appearance.  HENT:     Head: Normocephalic.     Right Ear: Tympanic membrane, ear canal and external ear normal.     Left Ear: Tympanic membrane, ear canal and external ear normal.     Nose: Congestion present.     Right Turbinates: Enlarged and swollen.     Left Turbinates: Enlarged and swollen.     Right Sinus: No maxillary sinus tenderness or frontal sinus tenderness.     Left Sinus: No maxillary sinus tenderness or frontal sinus tenderness.     Mouth/Throat:     Mouth: Mucous membranes are moist.     Pharynx: Posterior oropharyngeal erythema present.     Comments: Cobblestoning present to posterior oropharynx  Eyes:     Extraocular Movements: Extraocular movements intact.     Pupils: Pupils are equal, round, and reactive to  light.  Cardiovascular:     Rate and Rhythm: Normal rate and regular rhythm.     Pulses: Normal pulses.     Heart sounds: Normal heart sounds.  Pulmonary:     Effort: Pulmonary effort is normal. No respiratory distress.     Breath sounds: Normal breath sounds. No stridor. No wheezing, rhonchi or rales.  Abdominal:     General: Bowel sounds are normal.     Palpations: Abdomen is soft.     Tenderness: There is no abdominal tenderness.  Musculoskeletal:     Cervical back: Normal range of motion.  Lymphadenopathy:     Cervical: No cervical adenopathy.  Skin:    General: Skin is warm and dry.  Neurological:     General: No focal deficit present.     Mental Status: She is alert and oriented to person, place, and time.  Psychiatric:        Mood and Affect: Mood normal.        Behavior: Behavior normal.      UC Treatments / Results  Labs (all labs ordered are listed, but only abnormal results are displayed) Labs Reviewed  SARS CORONAVIRUS 2 (TAT 6-24 HRS)  CULTURE, GROUP A STREP [facility])  POCT RAPID STREP A (OFFICE)    EKG   Radiology No results found.  Procedures Procedures (including critical care time)  Medications Ordered in UC Medications - No data to display  Initial Impression / Assessment and Plan / UC Course  I have reviewed the triage vital signs and the nursing notes.  Pertinent labs & imaging results that were available during my care of the patient were reviewed by me and considered in my medical decision making (see chart for details).  The rapid strep test was negative, throat culture and COVID test  are pending.  Patient is able to receive Paxlovid if her COVID test is positive.  She will need to hold the Crestor for 2 weeks if she begins the Paxlovid.  Will provide symptomatic treatment with Bromfed-DM for her cough, fluticasone was previously prescribed.  Patient advised to purchase over-the-counter Zyrtec to begin daily.  Supportive care recommendations  were provided and discussed with the patient to include over-the-counter Tylenol for pain or discomfort, normal saline nasal spray, and use of a humidifier in her bedroom at nighttime during sleep for cough.  Discussed indications with the patient of when follow-up would be necessary.  Patient is in agreement with this plan of care and verbalizes understanding.  All questions were answered.  Patient stable for discharge.  Work note was provided.   Final Clinical Impressions(s) / UC Diagnoses   Final diagnoses:  Viral upper respiratory tract infection with cough  Encounter for screening for COVID-19     Discharge Instructions      The rapid strep test was negative.  A throat culture and COVID test are pending.  You will be contacted if the pending test results are abnormal.  If you begin treatment for COVID, you will need to hold your Crestor for 2 weeks. Take medication as prescribed.  Please pick up over-the-counter cetirizine (Zyrtec) to take daily.  I attempted to send a prescription, but it will be much cheaper for you to purchase the medication over-the-counter. Increase fluids and allow for plenty of rest. May take over-the-counter Tylenol as needed for pain, fever, or general discomfort. Recommend normal saline nasal spray throughout the day to help with nasal congestion and runny nose. Warm salt water gargles 3-4 times daily as needed for throat pain or discomfort. Recommend a soft diet to include soup, broth, yogurt, pudding, or Jell-O while throat pain persist. For your cough, recommend using a humidifier in your bedroom at nighttime during sleep and sleeping slightly elevated on pillows while cough symptoms persist. As discussed, if symptoms do not improve over the next 7 to 10 days, or if symptoms suddenly worsen, you may follow-up in this clinic or with your primary care physician for further evaluation. Follow-up as needed.     ED Prescriptions     Medication Sig Dispense  Auth. Provider   brompheniramine-pseudoephedrine-DM 30-2-10 MG/5ML syrup Take 5 mLs by mouth 4 (four) times daily as needed. 140 mL Leath-Warren, Sadie Haber, NP      PDMP not reviewed this encounter.   Abran Cantor, NP 07/20/23 1554

## 2023-07-20 NOTE — Telephone Encounter (Signed)
Pt called and reported has met deductible and would like zyrtec to be prescribed and not over the counter. Consulted Providers. Pt aware that when provider originally placed order for prescription it was flagged with a $61 out of pocket cost. Pt reported "send it in and we can see." Provider aware and given verbal to electronically send Zyrtec 10 mg,30 count, 0 refills. Pharmacy verified by pt.

## 2023-07-20 NOTE — Patient Instructions (Signed)
OTC coricidin for cough Increase hydration It appears that you have a viral upper respiratory infection (cold).  Cold symptoms can last up to 2 weeks.  I recommend that you only use cold medications that are safe in high blood pressure like Coricidin (generic is fine).  Other cold medications can increase your blood pressure.    - Get plenty of rest and drink plenty of fluids. - Try to breathe moist air. Use a cold mist humidifier. - Consume warm fluids (soup or tea) to provide relief for a stuffy nose and to loosen phlegm. - For nasal stuffiness, try saline nasal spray or a Neti Pot.  Afrin nasal spray can also be used but this product should not be used longer than 3 days or it will cause rebound nasal stuffiness (worsening nasal congestion). - For sore throat pain relief: use chloraseptic spray, suck on throat lozenges, hard candy or popsicles; gargle with warm salt water (1/4 tsp. salt per 8 oz. of water); and eat soft, bland foods. - Eat a well-balanced diet. If you cannot, ensure you are getting enough nutrients by taking a daily multivitamin. - Avoid dairy products, as they can thicken phlegm. - Avoid alcohol, as it impairs your body's immune system.  CONTACT YOUR DOCTOR IF YOU EXPERIENCE ANY OF THE FOLLOWING: - High fever - Ear pain - Sinus-type headache - Unusually severe cold symptoms - Cough that gets worse while other cold symptoms improve - Flare up of any chronic lung problem, such as asthma - Your symptoms persist longer than 2 weeks

## 2023-07-20 NOTE — ED Triage Notes (Signed)
Pt c/o nasal congestion, headache, ear pain, sore throat onset Saturday and has gotten worse.

## 2023-07-20 NOTE — Progress Notes (Signed)
Virtual Visit via video Note Due to COVID-19 pandemic this visit was conducted virtually. This visit type was conducted due to national recommendations for restrictions regarding the COVID-19 Pandemic (e.g. social distancing, sheltering in place) in an effort to limit this patient's exposure and mitigate transmission in our community. All issues noted in this document were discussed and addressed.  A physical exam was not performed with this format.   I connected with Kathryn Merritt on 07/20/2023 at 1300 by name and DOB and verified that I am speaking with the correct person using two identifiers. Kathryn Merritt is currently located at home currently with them during visit. The provider, Martina Sinner, DNP is located in their office at time of visit.  I discussed the limitations, risks, security and privacy concerns of performing an evaluation and management service by virtual visit and the availability of in person appointments. I also discussed with the patient that there may be a patient responsible charge related to this service. The patient expressed understanding and agreed to proceed.  Subjective:  Patient ID: Kathryn Merritt, female    DOB: [birth year], 62 y.o.   MRN: [id number]  Chief Complaint:  Cough and URI (Cough, congestion)   HPI: Kathryn Merritt is a 62 y.o. female presenting on 07/20/2023 for Cough and URI (Cough, congestion)  SUBJECTIVE:  Kathryn Merritt is a 62 y.o. female who complains of congestion, cough described as productive of green sputum, and fever for 2 days. She denies a history of anorexia, chest pain, fatigue, myalgias, and vomiting and denies a history of asthma. Patient denies smoke cigarettes.   OBJECTIVE: She appears well, vital signs are as noted. Ears normal.  Throat and pharynx normal.  Neck supple. No adenopathy in the neck. Nose is congested. Sinuses non tender. The chest is clear, without wheezes or rales.  Active Ambulatory Problems     Diagnosis Date Noted   Paroxysmal atrial fibrillation (HCC) 03/18/2021   Morbid obesity (HCC) 03/18/2021   Controlled type 2 diabetes mellitus without complication, without long-term current use of insulin (HCC) 03/18/2021   Hypertension 06/18/2021   Hyperlipidemia associated with type 2 diabetes mellitus (HCC) 06/18/2021   OSA (obstructive sleep apnea) 07/14/2021   Gastroesophageal reflux disease 07/16/2021   Subclinical hypothyroidism 10/23/2021   Esophageal dysphagia 01/13/2022   Vitamin D deficiency 01/21/2022   Stage 3a chronic kidney disease (HCC) 01/21/2022   Lesion of left ear 06/24/2022   Long-term current use of injectable noninsulin antidiabetic medication 03/29/2023   Hypercoagulable state due to paroxysmal atrial fibrillation (HCC) 03/29/2023   Nasal congestion 07/20/2023   Viral URI 07/20/2023   Resolved Ambulatory Problems    Diagnosis Date Noted   BRADYCARDIA 01/16/2010   FATIGUE 01/16/2010   Edema 01/16/2010   Establishing care with new doctor, encounter for 06/18/2021   Bronchitis 09/09/2021   Annual physical exam 09/23/2021   Abdominal pain 10/23/2021   Urinary tract infection with hematuria 10/23/2021   Constipation 01/13/2022   Encounter for screening colonoscopy 01/13/2022   Acute cough 01/21/2022   Sore throat 01/21/2022   Strep pharyngitis 01/21/2022   Chest pain 06/07/2022   Past Medical History:  Diagnosis Date   Chronic back pain    Diabetes mellitus    Edema leg    Hyperlipidemia    Joint pain    Kidney stones    PAF (paroxysmal atrial fibrillation) (HCC)    RBBB    Sinus bradycardia    Sleep apnea  Relevant past medical, surgical, family, and social history reviewed and updated as indicated.  Allergies and medications reviewed and updated.   Past Medical History:  Diagnosis Date   Chronic back pain    Diabetes mellitus    Edema leg    Hyperlipidemia    Hypertension    Joint pain    Kidney stones    Other malaise and  fatigue    PAF (paroxysmal atrial fibrillation) (HCC)    a. new diagnosis in 02/2021 --> s/p DCCV on 03/18/2021   RBBB    Sinus bradycardia    Sleep apnea     Past Surgical History:  Procedure Laterality Date   BALLOON DILATION N/A 02/22/2022   Procedure: BALLOON DILATION;  Surgeon: Lanelle Bal, DO;  Location: [facility];  Service: Endoscopy;  Laterality: N/A;   BIOPSY  02/22/2022   Procedure: BIOPSY;  Surgeon: Lanelle Bal, DO;  Location: [facility];  Service: Endoscopy;;   CARDIOVERSION N/A 03/18/2021   Procedure: CARDIOVERSION;  Surgeon: Jonelle Sidle, MD;  Location: [facility];  Service: Cardiovascular;  Laterality: N/A;   CESAREAN SECTION     times 2   COLONOSCOPY WITH PROPOFOL N/A 02/22/2022   Procedure: COLONOSCOPY WITH PROPOFOL;  Surgeon: Lanelle Bal, DO;  Location: [facility];  Service: Endoscopy;  Laterality: N/A;  7:30am   CORONARY ANGIOGRAPHY N/A 06/09/2022   Procedure: CORONARY ANGIOGRAPHY;  Surgeon: Orbie Pyo, MD;  Location: [facility];  Service: Cardiovascular;  Laterality: N/A;   ESOPHAGOGASTRODUODENOSCOPY (EGD) WITH PROPOFOL N/A 02/22/2022   Procedure: ESOPHAGOGASTRODUODENOSCOPY (EGD) WITH PROPOFOL;  Surgeon: Lanelle Bal, DO;  Location: [facility];  Service: Endoscopy;  Laterality: N/A;   INCISION AND DRAINAGE ABSCESS Right    axilla   LITHOTRIPSY     POLYPECTOMY  02/22/2022   Procedure: POLYPECTOMY;  Surgeon: Lanelle Bal, DO;  Location: [facility];  Service: Endoscopy;;    Social History   Socioeconomic History   Marital status: Married    Spouse name: Not on file   Number of children: 2   Years of education: Not on file   Highest education level: 12th grade  Occupational History   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  Tobacco Use   Smoking status: Never   Smokeless tobacco: Never  Vaping Use   Vaping status: Never Used  Substance and Sexual Activity   Alcohol use: No   Drug use: No   Sexual  activity: Not Currently  Other Topics Concern   Not on file  Social History Narrative   Live with her husband, works at [employer] center,    Social Determinants of Health   Financial Resource Strain: Low Risk  (12/21/2022)   Overall Financial Resource Strain (CARDIA)    Difficulty of Paying Living Expenses: Not very hard  Food Insecurity: No Food Insecurity (12/21/2022)   Hunger Vital Sign    Worried About Running Out of Food in the Last Year: Never true    Ran Out of Food in the Last Year: Never true  Transportation Needs: No Transportation Needs (12/21/2022)   PRAPARE - Administrator, Civil Service (Medical): No    Lack of Transportation (Non-Medical): No  Physical Activity: Insufficiently Active (12/21/2022)   Exercise Vital Sign    Days of Exercise per Week: 2 days    Minutes of Exercise per Session: 20 min  Stress: No Stress Concern Present (12/21/2022)   [employer] of  Occupational Health - Occupational Stress Questionnaire    Feeling of Stress : Only a little  Social Connections: Moderately Isolated (12/21/2022)   Social Connection and Isolation Panel [NHANES]    Frequency of Communication with Friends and Family: Three times a week    Frequency of Social Gatherings with Friends and Family: Once a week    Attends Religious Services: Never    Database administrator or Organizations: No    Attends Engineer, structural: Not on file    Marital Status: Married  Catering manager Violence: Not At Risk (06/08/2022)   Humiliation, Afraid, Rape, and Kick questionnaire    Fear of Current or Ex-Partner: No    Emotionally Abused: No    Physically Abused: No    Sexually Abused: No    Outpatient Encounter Medications as of 07/20/2023  Medication Sig   fluticasone (FLONASE) 50 MCG/ACT nasal spray Place 2 sprays into both nostrils daily.   acetaminophen (TYLENOL) 500 MG tablet Take 1,000 mg by mouth every 8 (eight) hours as needed for moderate pain.    albuterol (VENTOLIN HFA) 108 (90 Base) MCG/ACT inhaler Inhale 2 puffs into the lungs every 6 (six) hours as needed for wheezing or shortness of breath.   amLODipine (NORVASC) 5 MG tablet Take 1 tablet (5 mg total) by mouth daily.   apixaban (ELIQUIS) 5 MG TABS tablet Take 1 tablet (5 mg total) by mouth 2 (two) times daily.   blood glucose meter kit and supplies Dispense based on patient and insurance preference. Use up to four times daily as directed. (FOR ICD-10 E10.9, E11.9).   Cholecalciferol (VITAMIN D3) 50 MCG (2000 UT) capsule Take 1 capsule (2,000 Units total) by mouth daily.   lisinopril (ZESTRIL) 20 MG tablet Take 1 tablet (20 mg total) by mouth daily.   lubiprostone (AMITIZA) 24 MCG capsule Take 1 capsule (24 mcg total) by mouth 2 (two) times daily.   meclizine (ANTIVERT) 12.5 MG tablet Take 1 tablet (12.5 mg total) by mouth 3 (three) times daily as needed for dizziness.   omeprazole (PRILOSEC) 40 MG capsule Take 1 capsule (40 mg total) by mouth daily.   rosuvastatin (CRESTOR) 10 MG tablet Take 1 tablet (10 mg total) by mouth daily.   Semaglutide,0.25 or 0.5MG /DOS, 2 MG/3ML SOPN Inject 0.5 mg into the skin once a week.   torsemide (DEMADEX) 100 MG tablet Take 1 tablet (100 mg total) by mouth daily.   No facility-administered encounter medications on file as of 07/20/2023.    Allergies  Allergen Reactions   Metformin And Related     Made patient feel bad/GI    Review of Systems  Constitutional:  Positive for fever. Negative for chills and fatigue.  HENT:  Positive for congestion and rhinorrhea.   Respiratory:  Positive for cough.        Green sputum for 2 days  Cardiovascular:  Negative for chest pain, palpitations and leg swelling.  Gastrointestinal:  Negative for diarrhea, nausea and vomiting.  Genitourinary:  Negative for dysuria and frequency.  Musculoskeletal:  Negative for myalgias.  Skin:  Negative for color change and rash.  Neurological:  Negative for dizziness and  headaches.    Observations/Objective: No vital signs or physical exam, this was a virtual health encounter.  Pt alert and oriented, answers all questions appropriately, and able to speak in full sentences.    Assessment and Plan: Rafelita was seen today for cough and uri.  Diagnoses and all orders for this visit:  Viral  URI  Nasal congestion -     fluticasone (FLONASE) 50 MCG/ACT nasal spray; Place 2 sprays into both nostrils daily. OTC coricidin for cough  Follow Up Instructions: Return if symptoms worsen or fail to improve. I discussed the assessment and treatment plan with the patient. The patient was provided an opportunity to ask questions and all were answered. The patient agreed with the plan and demonstrated an understanding of the instructions.   The patient was advised to call back or seek an in-person evaluation if the symptoms worsen or if the condition fails to improve as anticipated.  The above assessment and management plan was discussed with the patient. The patient verbalized understanding of and has agreed to the management plan. Patient is aware to call the clinic if they develop any new symptoms or if symptoms persist or worsen. Patient is aware when to return to the clinic for a follow-up visit. Patient educated on when it is appropriate to go to the emergency department.    I provided 10 minutes of time during this video encounter. It appears that you have a viral upper respiratory infection (cold).  Cold symptoms can last up to 2 weeks.  I recommend that you only use cold medications that are safe in high blood pressure like Coricidin (generic is fine).  Other cold medications can increase your blood pressure.    - Get plenty of rest and drink plenty of fluids. - Try to breathe moist air. Use a cold mist humidifier. - Consume warm fluids (soup or tea) to provide relief for a stuffy nose and to loosen phlegm. - For nasal stuffiness, try saline nasal spray or a  Neti Pot.  Afrin nasal spray can also be used but this product should not be used longer than 3 days or it will cause rebound nasal stuffiness (worsening nasal congestion). - For sore throat pain relief: use chloraseptic spray, suck on throat lozenges, hard candy or popsicles; gargle with warm salt water (1/4 tsp. salt per 8 oz. of water); and eat soft, bland foods. - Eat a well-balanced diet. If you cannot, ensure you are getting enough nutrients by taking a daily multivitamin. - Avoid dairy products, as they can thicken phlegm. - Avoid alcohol, as it impairs your body's immune system.  CONTACT YOUR DOCTOR IF YOU EXPERIENCE ANY OF THE FOLLOWING: - High fever - Ear pain - Sinus-type headache - Unusually severe cold symptoms - Cough that gets worse while other cold symptoms improve - Flare up of any chronic lung problem, such as asthma - Your symptoms persist longer than 2 weeks   Martina Sinner, DNP [facility] [address] [place], [place] [address] [phone] 07/20/2023

## 2023-07-21 LAB — SARS CORONAVIRUS 2 (TAT 6-24 HRS): SARS Coronavirus 2: NEGATIVE

## 2023-07-23 LAB — CULTURE, GROUP A STREP (THRC)

## 2023-08-01 ENCOUNTER — Ambulatory Visit (INDEPENDENT_AMBULATORY_CARE_PROVIDER_SITE_OTHER): Payer: Managed Care, Other (non HMO) | Admitting: Nurse Practitioner

## 2023-08-01 VITALS — BP 105/71 | HR 65 | Temp 97.9°F | Resp 20 | Ht 63.0 in | Wt 250.0 lb

## 2023-08-01 DIAGNOSIS — R1319 Other dysphagia: Secondary | ICD-10-CM | POA: Diagnosis not present

## 2023-08-01 DIAGNOSIS — K5902 Outlet dysfunction constipation: Secondary | ICD-10-CM

## 2023-08-01 DIAGNOSIS — K219 Gastro-esophageal reflux disease without esophagitis: Secondary | ICD-10-CM | POA: Diagnosis not present

## 2023-08-01 DIAGNOSIS — G4733 Obstructive sleep apnea (adult) (pediatric): Secondary | ICD-10-CM

## 2023-08-01 DIAGNOSIS — N1831 Chronic kidney disease, stage 3a: Secondary | ICD-10-CM

## 2023-08-01 DIAGNOSIS — Z7985 Long-term (current) use of injectable non-insulin antidiabetic drugs: Secondary | ICD-10-CM

## 2023-08-01 DIAGNOSIS — I480 Paroxysmal atrial fibrillation: Secondary | ICD-10-CM

## 2023-08-01 DIAGNOSIS — I10 Essential (primary) hypertension: Secondary | ICD-10-CM

## 2023-08-01 DIAGNOSIS — E785 Hyperlipidemia, unspecified: Secondary | ICD-10-CM

## 2023-08-01 DIAGNOSIS — E038 Other specified hypothyroidism: Secondary | ICD-10-CM

## 2023-08-01 DIAGNOSIS — E119 Type 2 diabetes mellitus without complications: Secondary | ICD-10-CM | POA: Insufficient documentation

## 2023-08-01 DIAGNOSIS — E1169 Type 2 diabetes mellitus with other specified complication: Secondary | ICD-10-CM

## 2023-08-01 DIAGNOSIS — E559 Vitamin D deficiency, unspecified: Secondary | ICD-10-CM

## 2023-08-01 LAB — BAYER DCA HB A1C WAIVED: HB A1C (BAYER DCA - WAIVED): 9.3 % — ABNORMAL HIGH (ref 4.8–5.6)

## 2023-08-01 MED ORDER — METFORMIN HCL 500 MG PO TABS: 500.0000 mg | ORAL_TABLET | Freq: Two times a day (BID) | ORAL | 3 refills | Status: DC

## 2023-08-01 MED ORDER — TORSEMIDE 100 MG PO TABS: 100.0000 mg | ORAL_TABLET | Freq: Every day | ORAL | 1 refills | Status: DC

## 2023-08-01 MED ORDER — PANTOPRAZOLE SODIUM 40 MG PO TBEC: 40.0000 mg | DELAYED_RELEASE_TABLET | Freq: Every day | ORAL | 3 refills | Status: DC

## 2023-08-01 MED ORDER — ROSUVASTATIN CALCIUM 10 MG PO TABS: 10.0000 mg | ORAL_TABLET | Freq: Every day | ORAL | 1 refills | Status: DC

## 2023-08-01 MED ORDER — LUBIPROSTONE 24 MCG PO CAPS: 24.0000 ug | ORAL_CAPSULE | Freq: Two times a day (BID) | ORAL | 0 refills | Status: DC

## 2023-08-01 MED ORDER — SEMAGLUTIDE (1 MG/DOSE) 4 MG/3ML ~~LOC~~ SOPN
1.0000 mg | PEN_INJECTOR | SUBCUTANEOUS | 3 refills | Status: DC
  Filled 2023-09-15: qty 3, 28d supply, fill #0
  Filled 2023-10-11: qty 3, 28d supply, fill #1

## 2023-08-01 MED ORDER — LISINOPRIL 20 MG PO TABS: 20.0000 mg | ORAL_TABLET | Freq: Every day | ORAL | 1 refills | Status: DC

## 2023-08-01 MED ORDER — AMLODIPINE BESYLATE 5 MG PO TABS: 5.0000 mg | ORAL_TABLET | Freq: Every day | ORAL | 1 refills | Status: DC

## 2023-08-01 NOTE — Progress Notes (Addendum)
Subjective:    Patient ID: Kathryn Merritt, female    DOB: 1961-01-13, 62 y.o.   MRN: [id number]   Chief Complaint: medical management of chronic issues     HPI:  Kathryn Merritt is a 62 y.o. who identifies as a female who was assigned female at birth.   Social history: Lives with: by herself Work history: retired   Water engineer in today for follow up of the following chronic medical issues:  1. Primary hypertension No c/o chest pain, sob or headache. Does not check blood pressure at home. BP Readings from Last 3 Encounters:  08/01/23 105/71  07/20/23 (!) 144/80  06/21/23 136/81      2. Paroxysmal atrial fibrillation (HCC) No c/o palpitations or heart racing  3. Hyperlipidemia associated with type 2 diabetes mellitus (HCC) Does not watch diet and does no dedicated exercise Lab Results  Component Value Date   CHOL 152 03/29/2023   HDL 57 03/29/2023   LDLCALC 71 03/29/2023   TRIG 136 03/29/2023   CHOLHDL 2.7 03/29/2023     4. Controlled type 2 diabetes mellitus without complication, without long-term current use of insulin (HCC) 5. Diabetes mellitus treated with injections of non-insulin medication (HCC) Fasting blood sugars re running around 200. Patient was started on ozempic at last visit. Lab Results  Component Value Date   HGBA1C 9.1 (H) 05/30/2023     6. Gastroesophageal reflux disease, unspecified whether esophagitis present Is on omeprazole daily and is doing well.  7. Esophageal dysphagia No recent issues  8. OSA (obstructive sleep apnea) She doe snot wear her cpap  9. Subclinical hypothyroidism No issues that she is aware of. Lab Results  Component Value Date   TSH 3.690 10/23/2021     10. Vitamin D deficiency Is on daily vitamin d supplement Last vitamin D Lab Results  Component Value Date   VD25OH 18.6 (L) 09/24/2022     11. Stage 3a chronic kidney disease (HCC) No voiding issues and no edema Lab Results  Component Value Date    CREATININE 0.91 04/14/2023     12. Morbid obesity (HCC) Weight is down 7 lbs Wt Readings from Last 3 Encounters:  08/01/23 250 lb (113.4 kg)  06/21/23 257 lb (116.6 kg)  05/30/23 259 lb (117.5 kg)   BMI Readings from Last 3 Encounters:  08/01/23 44.29 kg/m  06/21/23 45.53 kg/m  05/30/23 45.88 kg/m      New complaints: None today  Allergies  Allergen Reactions   Metformin And Related     Made patient feel bad/GI   Outpatient Encounter Medications as of 08/01/2023  Medication Sig   acetaminophen (TYLENOL) 500 MG tablet Take 1,000 mg by mouth every 8 (eight) hours as needed for moderate pain.   albuterol (VENTOLIN HFA) 108 (90 Base) MCG/ACT inhaler Inhale 2 puffs into the lungs every 6 (six) hours as needed for wheezing or shortness of breath.   amLODipine (NORVASC) 5 MG tablet Take 1 tablet (5 mg total) by mouth daily.   apixaban (ELIQUIS) 5 MG TABS tablet Take 1 tablet (5 mg total) by mouth 2 (two) times daily.   blood glucose meter kit and supplies Dispense based on patient and insurance preference. Use up to four times daily as directed. (FOR ICD-10 E10.9, E11.9).   brompheniramine-pseudoephedrine-DM 30-2-10 MG/5ML syrup Take 5 mLs by mouth 4 (four) times daily as needed.   cetirizine (ZYRTEC ALLERGY) 10 MG tablet Take 1 tablet (10 mg total) by mouth daily.   Cholecalciferol (VITAMIN  D3) 50 MCG (2000 UT) capsule Take 1 capsule (2,000 Units total) by mouth daily.   fluticasone (FLONASE) 50 MCG/ACT nasal spray Place 2 sprays into both nostrils daily.   lisinopril (ZESTRIL) 20 MG tablet Take 1 tablet (20 mg total) by mouth daily.   lubiprostone (AMITIZA) 24 MCG capsule Take 1 capsule (24 mcg total) by mouth 2 (two) times daily.   meclizine (ANTIVERT) 12.5 MG tablet Take 1 tablet (12.5 mg total) by mouth 3 (three) times daily as needed for dizziness.   omeprazole (PRILOSEC) 40 MG capsule Take 1 capsule (40 mg total) by mouth daily.   rosuvastatin (CRESTOR) 10 MG tablet Take  1 tablet (10 mg total) by mouth daily.   Semaglutide,0.25 or 0.5MG /DOS, 2 MG/3ML SOPN Inject 0.5 mg into the skin once a week.   torsemide (DEMADEX) 100 MG tablet Take 1 tablet (100 mg total) by mouth daily.   No facility-administered encounter medications on file as of 08/01/2023.    Past Surgical History:  Procedure Laterality Date   BALLOON DILATION N/A 02/22/2022   Procedure: BALLOON DILATION;  Surgeon: Lanelle Bal, DO;  Location: [facility];  Service: Endoscopy;  Laterality: N/A;   BIOPSY  02/22/2022   Procedure: BIOPSY;  Surgeon: Lanelle Bal, DO;  Location: [facility];  Service: Endoscopy;;   CARDIOVERSION N/A 03/18/2021   Procedure: CARDIOVERSION;  Surgeon: Jonelle Sidle, MD;  Location: [facility];  Service: Cardiovascular;  Laterality: N/A;   CESAREAN SECTION     times 2   COLONOSCOPY WITH PROPOFOL N/A 02/22/2022   Procedure: COLONOSCOPY WITH PROPOFOL;  Surgeon: Lanelle Bal, DO;  Location: [facility];  Service: Endoscopy;  Laterality: N/A;  7:30am   CORONARY ANGIOGRAPHY N/A 06/09/2022   Procedure: CORONARY ANGIOGRAPHY;  Surgeon: Orbie Pyo, MD;  Location: [facility];  Service: Cardiovascular;  Laterality: N/A;   ESOPHAGOGASTRODUODENOSCOPY (EGD) WITH PROPOFOL N/A 02/22/2022   Procedure: ESOPHAGOGASTRODUODENOSCOPY (EGD) WITH PROPOFOL;  Surgeon: Lanelle Bal, DO;  Location: [facility];  Service: Endoscopy;  Laterality: N/A;   INCISION AND DRAINAGE ABSCESS Right    axilla   LITHOTRIPSY     POLYPECTOMY  02/22/2022   Procedure: POLYPECTOMY;  Surgeon: Lanelle Bal, DO;  Location: [facility];  Service: Endoscopy;;    Family History  Problem Relation Age of Onset   Hypertension Mother    Cancer Father    Hypertension Father    Cancer Brother    Heart disease Paternal Grandmother    Breast cancer Neg Hx    Colon cancer Neg Hx       Controlled substance contract: n/a     Review of Systems  Constitutional:  Negative for  diaphoresis.  Eyes:  Negative for pain.  Respiratory:  Negative for shortness of breath.   Cardiovascular:  Negative for chest pain, palpitations and leg swelling.  Gastrointestinal:  Negative for abdominal pain.  Endocrine: Negative for polydipsia.  Skin:  Negative for rash.  Neurological:  Negative for dizziness, weakness and headaches.  Hematological:  Does not bruise/bleed easily.  All other systems reviewed and are negative.      Objective:   Physical Exam Vitals and nursing note reviewed.  Constitutional:      General: She is not in acute distress.    Appearance: Normal appearance. She is well-developed.  HENT:     Head: Normocephalic.     Right Ear: Tympanic membrane normal.     Left Ear: Tympanic membrane normal.  Nose: Nose normal.     Mouth/Throat:     Mouth: Mucous membranes are moist.  Eyes:     Pupils: Pupils are equal, round, and reactive to light.  Neck:     Vascular: No carotid bruit or JVD.  Cardiovascular:     Rate and Rhythm: Normal rate and regular rhythm.     Heart sounds: Normal heart sounds.  Pulmonary:     Effort: Pulmonary effort is normal. No respiratory distress.     Breath sounds: Normal breath sounds. No wheezing or rales.  Chest:     Chest wall: No tenderness.  Abdominal:     General: Bowel sounds are normal. There is no distension or abdominal bruit.     Palpations: Abdomen is soft. There is no hepatomegaly, splenomegaly, mass or pulsatile mass.     Tenderness: There is no abdominal tenderness.  Musculoskeletal:        General: Normal range of motion.     Cervical back: Normal range of motion and neck supple.  Lymphadenopathy:     Cervical: No cervical adenopathy.  Skin:    General: Skin is warm and dry.  Neurological:     Mental Status: She is alert and oriented to person, place, and time.     Deep Tendon Reflexes: Reflexes are normal and symmetric.  Psychiatric:        Behavior: Behavior normal.        Thought Content: Thought  content normal.        Judgment: Judgment normal.    BP 105/71   Pulse 65   Temp 97.9 F (36.6 C) (Temporal)   Resp 20   Ht 5\' 3"  (1.6 m)   Wt 250 lb (113.4 kg)   SpO2 96%   BMI 44.29 kg/m    Hgba1c .3.9     Assessment & Plan:  Kathryn Merritt comes in today with chief complaint of Medical Management of Chronic Issues   Diagnosis and orders addressed:  1. Primary hypertension Low sodium diet - CBC with Differential/Platelet - CMP14+EGFR - amLODipine (NORVASC) 5 MG tablet; Take 1 tablet (5 mg total) by mouth daily.  Dispense: 90 tablet; Refill: 1 - lisinopril (ZESTRIL) 20 MG tablet; Take 1 tablet (20 mg total) by mouth daily.  Dispense: 90 tablet; Refill: 1 - torsemide (DEMADEX) 100 MG tablet; Take 1 tablet (100 mg total) by mouth daily.  Dispense: 90 tablet; Refill: 1  2. Paroxysmal atrial fibrillation (HCC) Avoid caffeine  3. Hyperlipidemia associated with type 2 diabetes mellitus (HCC) Low fat diet - Lipid panel - rosuvastatin (CRESTOR) 10 MG tablet; Take 1 tablet (10 mg total) by mouth daily.  Dispense: 90 tablet; Refill: 1  4. Controlled type 2 diabetes mellitus without complication, without long-term current use of insulin (HCC) Stricter carb counting Added metformin to meds - Bayer DCA Hb A1c Waived - metFORMIN (GLUCOPHAGE) 500 MG tablet; Take 1 tablet (500 mg total) by mouth 2 (two) times daily with a meal.  Dispense: 180 tablet; Refill: 3  5. Diabetes mellitus treated with injections of non-insulin medication (HCC) Increased to 1mg  weekly - Semaglutide, 1 MG/DOSE, 4 MG/3ML SOPN; Inject 1 mg as directed once a week.  Dispense: 3 mL; Refill: 3  6. Gastroesophageal reflux disease, unspecified whether esophagitis present Avoid spicy foods Do not eat 2 hours prior to bedtime Stop omeprazole Start protonix daily - pantoprazole (PROTONIX) 40 MG tablet; Take 1 tablet (40 mg total) by mouth daily.  Dispense: 30 tablet; Refill: 3  7. Esophageal dysphagia Chew  food well  8. OSA (obstructive sleep apnea) Needs to wear cpap  9. Subclinical hypothyroidism Labs pending  10. Vitamin D deficiency On daily vitamin d supplement  11. Stage 3a chronic kidney disease (HCC) Labs pending  12. Morbid obesity (HCC) Discussed diet and exercise for person with BMI >25 Will recheck weight in 3-6 months   13. Constipation due to outlet dysfunction May need to stop meds if metformin causes looser stools. - lubiprostone (AMITIZA) 24 MCG capsule; Take 1 capsule (24 mcg total) by mouth 2 (two) times daily.  Dispense: 180 capsule; Refill: 0   Labs pending Health Maintenance reviewed Diet and exercise encouraged  Follow up plan: 3 months   Mary-Margaret Daphine Deutscher, FNP

## 2023-08-01 NOTE — Patient Instructions (Signed)
Metformin Extended-Release Suspension What is this medication? METFORMIN (met FOR min) treats type 2 diabetes. It controls blood sugar (glucose) and helps your body use insulin effectively. This medication is often combined with changes to diet and exercise. This medicine may be used for other purposes; ask your health care provider or pharmacist if you have questions. COMMON BRAND NAME(S): RIOMET ER What should I tell my care team before I take this medication? They need to know if you have any of these conditions: Anemia Dehydration Frequently drink alcohol Heart disease Kidney disease Liver disease Polycystic ovary syndrome Serious infection or injury Vomiting An unusual or allergic reaction to metformin, other medications, foods, dyes, or preservatives Pregnant or trying to get pregnant Breastfeeding How should I use this medication? Take this medication by mouth. Take it as directed on the prescription label at the same time every day. Take this medication with food. Shake well before using. Use a specially marked spoon or container to measure each dose. Ask your pharmacist if you do not have one. Household spoons are not accurate. Keep taking it unless your care team tells you to stop. Talk to your care team about the use of this medication in children. While it may be prescribed for children as young as 85 years of age for selected conditions, precautions do apply. Overdosage: If you think you have taken too much of this medicine contact a poison control center or emergency room at once. NOTE: This medicine is only for you. Do not share this medicine with others. What if I miss a dose? If you miss a dose, take it as soon as you can. If it is almost time for your next dose, take only that dose. Do not take double or extra doses. What may interact with this medication? Do not take this medication with any of the following: Certain contrast medications given before X-rays, CT scans,  MRI, or other procedures Dofetilide This medication may also interact with the following: Acetazolamide Alcohol Certain antivirals for HIV or hepatitis Certain medications for blood pressure, heart disease, irregular heartbeat Cimetidine Dichlorphenamide Digoxin Diuretics Estrogen and progestin hormones Glycopyrrolate Isoniazid Lamotrigine Memantine Methazolamide Metoclopramide Midodrine Niacin Phenothiazines, such as chlorpromazine, mesoridazine, prochlorperazine, thioridazine Phenytoin Ranolazine Steroid medications, such as prednisone or cortisone Stimulant medications for attention disorders, weight loss, or to stay awake Thyroid medications Topiramate Trospium Vandetanib Zonisamide This list may not describe all possible interactions. Give your health care provider a list of all the medicines, herbs, non-prescription drugs, or dietary supplements you use. Also tell them if you smoke, drink alcohol, or use illegal drugs. Some items may interact with your medicine. What should I watch for while using this medication? Visit your care team for regular checks on your progress. A test called the HbA1C (A1C) will be monitored. This is a simple blood test. It measures your blood sugar control over the last 2 to 3 months. You will receive this test every 3 to 6 months. Using this medication with insulin or a sulfonylurea may increase your risk of hypoglycemia. Learn how to check your blood sugar. Learn the symptoms of low and high blood sugar and how to manage them. Always carry a quick-source of sugar with you in case you have symptoms of low blood sugar. Examples include hard sugar candy or glucose tablets. Make sure others know that you can choke if you eat or drink when you develop serious symptoms of low blood sugar, such as seizures or unconsciousness. They must get medical help  at once. Tell your care team if you have high blood sugar. You might need to change the dose of your  medication. If you are sick or exercising more than usual, you might need to change the dose of your medication. Do not skip meals. Ask your care team if you should avoid alcohol. Many nonprescription cough and cold products contain sugar or alcohol. These can affect blood sugar. This medication may cause you to ovulate, which may increase your chances of becoming pregnant. Talk with your care team about contraception while you are taking this medication. Contact your care team if you think you may be pregnant. If you are going to need surgery, an MRI, CT scan, or other procedure, tell your care team that you are taking this medication. You may need to stop taking this medication before the procedure. Wear a medical ID bracelet or chain. Carry a card that describes your condition. List the medications and doses you take on the card. This medication may cause a decrease in folic acid and vitamin B12. You should make sure that you get enough vitamins while you are taking this medication. Discuss the foods you eat and the vitamins you take with your care team. What side effects may I notice from receiving this medication? Side effects that you should report to your care team as soon as possible: Allergic reactions--skin rash, itching, hives, swelling of the face, lips, tongue, or throat High lactic acid level--muscle pain or cramps, stomach pain, trouble breathing, general discomfort or fatigue Low vitamin B12 level--pain, tingling, or numbness in the hands or feet, muscle weakness, dizziness, confusion, difficulty concentrating Side effects that usually do not require medical attention (report to your care team if they continue or are bothersome): Diarrhea Gas Headache Metallic taste in mouth Nausea This list may not describe all possible side effects. Call your doctor for medical advice about side effects. You may report side effects to FDA at 1-800-FDA-1088. Where should I keep my medication? Keep  out of the reach of children and pets. Store between 15 and 30 degrees C (59 and 86 degrees F). Keep this medication in the original container. Get rid of any unused medication after 100 days. To get rid of medications that are no longer needed or expired: Take the medication to a medication take-back program. Check with your pharmacy or law enforcement to find a location. If you cannot return the medication, check the label or package insert to see if the medication should be thrown out in the garbage or flushed down the toilet. If you are not sure, ask your care team. If it is safe to put in the trash, empty the medication out of the container. Mix the medication with cat litter, dirt, coffee grounds, or other unwanted substance. Seal the mixture in a bag or container. Put it in the trash. NOTE: This sheet is a summary. It may not cover all possible information. If you have questions about this medicine, talk to your doctor, pharmacist, or health care provider.  2024 Elsevier/Gold Standard (2022-04-05 00:00:00)

## 2023-08-02 LAB — CBC WITH DIFFERENTIAL/PLATELET
Basophils Absolute: 0.1 10*3/uL (ref 0.0–0.2)
Basos: 1 %
EOS (ABSOLUTE): 0.1 10*3/uL (ref 0.0–0.4)
Eos: 2 %
Hematocrit: 41.2 % (ref 34.0–46.6)
Hemoglobin: 13.1 g/dL (ref 11.1–15.9)
Immature Grans (Abs): 0 10*3/uL (ref 0.0–0.1)
Immature Granulocytes: 0 %
Lymphocytes Absolute: 2.5 10*3/uL (ref 0.7–3.1)
Lymphs: 39 %
MCH: 29.5 pg (ref 26.6–33.0)
MCHC: 31.8 g/dL (ref 31.5–35.7)
MCV: 93 fL (ref 79–97)
Monocytes Absolute: 0.4 10*3/uL (ref 0.1–0.9)
Monocytes: 7 %
Neutrophils Absolute: 3.3 10*3/uL (ref 1.4–7.0)
Neutrophils: 51 %
Platelets: 256 10*3/uL (ref 150–450)
RBC: 4.44 x10E6/uL (ref 3.77–5.28)
RDW: 13.1 % (ref 11.7–15.4)
WBC: 6.4 10*3/uL (ref 3.4–10.8)

## 2023-08-02 LAB — LIPID PANEL
Chol/HDL Ratio: 2.7 ratio (ref 0.0–4.4)
Cholesterol, Total: 139 mg/dL (ref 100–199)
HDL: 52 mg/dL (ref >39)
LDL Chol Calc (NIH): 67 mg/dL (ref 0–99)
Triglycerides: 108 mg/dL (ref 0–149)
VLDL Cholesterol Cal: 20 mg/dL (ref 5–40)

## 2023-08-02 LAB — CMP14+EGFR
ALT: 16 IU/L (ref 0–32)
AST: 14 [IU]/L (ref 0–40)
Albumin: 3.9 g/dL (ref 3.9–4.9)
Alkaline Phosphatase: 78 [IU]/L (ref 44–121)
BUN/Creatinine Ratio: 16 (ref 12–28)
BUN: 21 mg/dL (ref 8–27)
Bilirubin Total: 0.3 mg/dL (ref 0.0–1.2)
CO2: 22 mmol/L (ref 20–29)
Calcium: 10 mg/dL (ref 8.7–10.3)
Chloride: 107 mmol/L — ABNORMAL HIGH (ref 96–106)
Creatinine, Ser: 1.34 mg/dL — ABNORMAL HIGH (ref 0.57–1.00)
Globulin, Total: 2.1 g/dL (ref 1.5–4.5)
Glucose: 180 mg/dL — ABNORMAL HIGH (ref 70–99)
Potassium: 4.1 mmol/L (ref 3.5–5.2)
Sodium: 143 mmol/L (ref 134–144)
Total Protein: 6 g/dL (ref 6.0–8.5)
eGFR: 45 mL/min/{1.73_m2} — ABNORMAL LOW (ref >59)

## 2023-08-15 ENCOUNTER — Other Ambulatory Visit: Payer: Self-pay | Admitting: Nurse Practitioner

## 2023-08-23 ENCOUNTER — Ambulatory Visit (INDEPENDENT_AMBULATORY_CARE_PROVIDER_SITE_OTHER): Payer: Commercial Managed Care - HMO | Admitting: Nurse Practitioner

## 2023-08-23 ENCOUNTER — Telehealth: Payer: Self-pay

## 2023-08-23 ENCOUNTER — Encounter: Payer: Self-pay | Admitting: Nurse Practitioner

## 2023-08-23 VITALS — BP 169/93 | HR 62 | Temp 97.4°F | Resp 20 | Ht 63.0 in | Wt 253.0 lb

## 2023-08-23 DIAGNOSIS — E119 Type 2 diabetes mellitus without complications: Secondary | ICD-10-CM

## 2023-08-23 DIAGNOSIS — M5432 Sciatica, left side: Secondary | ICD-10-CM

## 2023-08-23 MED ORDER — GLIPIZIDE 5 MG PO TABS: 5.0000 mg | ORAL_TABLET | Freq: Two times a day (BID) | ORAL | 3 refills | Status: DC

## 2023-08-23 MED ORDER — METHYLPREDNISOLONE ACETATE 80 MG/ML IJ SUSP
80.0000 mg | Freq: Once | INTRAMUSCULAR | Status: AC
  Administered 2023-08-23: 80 mg via INTRAMUSCULAR

## 2023-08-23 MED ORDER — KETOROLAC TROMETHAMINE 60 MG/2ML IM SOLN
60.0000 mg | Freq: Once | INTRAMUSCULAR | Status: AC
  Administered 2023-08-23: 60 mg via INTRAMUSCULAR

## 2023-08-23 NOTE — Telephone Encounter (Signed)
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

## 2023-08-23 NOTE — Progress Notes (Signed)
Subjective:    Patient ID: Kathryn Merritt, female    DOB: 1961/05/07, 62 y.o.   MRN: [id number]   Chief Complaint: sciatica  Back Pain Pertinent negatives include no abdominal pain, chest pain, headaches or weakness.    Patient comes in today c/o of sciatica pain. This started several years ago but is intermittent. Has been bothering her for the last 3 weeks. Pain radiates all the way down her left leg. Rates pain 8/10 currently. Worse in mornings and eases some throughout the day. She has been taking tylenol arthritis which helps some.  * metformin is causing nausea and diarrhea- Insurance denied increase dose of ozempic Patient Active Problem List   Diagnosis Date Noted   Diabetes mellitus treated with injections of non-insulin medication (HCC) 08/01/2023   Viral URI 07/20/2023   Hypercoagulable state due to paroxysmal atrial fibrillation (HCC) 03/29/2023   Lesion of left ear 06/24/2022   Vitamin D deficiency 01/21/2022   Stage 3a chronic kidney disease (HCC) 01/21/2022   Constipation due to outlet dysfunction 01/13/2022   Esophageal dysphagia 01/13/2022   Subclinical hypothyroidism 10/23/2021   Gastroesophageal reflux disease 07/16/2021   OSA (obstructive sleep apnea) 07/14/2021   Hypertension 06/18/2021   Hyperlipidemia associated with type 2 diabetes mellitus (HCC) 06/18/2021   Paroxysmal atrial fibrillation (HCC) 03/18/2021   Morbid obesity (HCC) 03/18/2021   Controlled type 2 diabetes mellitus without complication, without long-term current use of insulin (HCC) 03/18/2021       Review of Systems  Constitutional:  Negative for diaphoresis.  Eyes:  Negative for pain.  Respiratory:  Negative for shortness of breath.   Cardiovascular:  Negative for chest pain, palpitations and leg swelling.  Gastrointestinal:  Negative for abdominal pain.  Endocrine: Negative for polydipsia.  Musculoskeletal:  Positive for back pain.  Skin:  Negative for rash.  Neurological:   Negative for dizziness, weakness and headaches.  Hematological:  Does not bruise/bleed easily.  All other systems reviewed and are negative.      Objective:   Physical Exam Constitutional:      Appearance: Normal appearance.  Cardiovascular:     Rate and Rhythm: Normal rate and regular rhythm.     Heart sounds: Normal heart sounds.  Pulmonary:     Breath sounds: Normal breath sounds.  Musculoskeletal:     Comments: FROM of lumbar spine with pain on flexion, extension and twisting.  Skin:    General: Skin is warm.  Neurological:     General: No focal deficit present.     Mental Status: She is alert and oriented to person, place, and time.  Psychiatric:        Mood and Affect: Mood normal.        Behavior: Behavior normal.     BP (!) 169/93   Pulse 62   Temp (!) 97.4 F (36.3 C) (Temporal)   Resp 20   Ht 5\' 3"  (1.6 m)   Wt 253 lb (114.8 kg)   SpO2 100%   BMI 44.82 kg/m        Assessment & Plan:   SHEILAH LINK in today with chief complaint of Back Pain (Knee pain/Left elbow pain/)   1. Sciatica of left side Moist heat Rest RTO prn - methylPREDNISolone acetate (DEPO-MEDROL) injection 80 mg - ketorolac (TORADOL) injection 60 mg  2. Controlled type 2 diabetes mellitus without complication, without long-term current use of insulin (HCC) Stop metformin due to side effects Will check on ozempic dose with insurance Continue  to watch carbs in diet - glipiZIDE (GLUCOTROL) 5 MG tablet; Take 1 tablet (5 mg total) by mouth 2 (two) times daily before a meal.  Dispense: 60 tablet; Refill: 3    The above assessment and management plan was discussed with the patient. The patient verbalized understanding of and has agreed to the management plan. Patient is aware to call the clinic if symptoms persist or worsen. Patient is aware when to return to the clinic for a follow-up visit. Patient educated on when it is appropriate to go to the emergency department.   Mary-Margaret  Daphine Deutscher, FNP

## 2023-08-23 NOTE — Patient Instructions (Signed)
Sciatica  Sciatica is pain, weakness, tingling, or loss of feeling (numbness) along the sciatic nerve. The sciatic nerve starts in the lower back and goes down the back of each leg. Sciatica usually affects one side of the body. Sciatica usually goes away on its own or with treatment. Sometimes, sciatica may come back. What are the causes? This condition happens when the sciatic nerve is pinched or has pressure put on it. This may be caused by: A disk in between the bones of the spine bulging out too far (herniated disk). Changes in the spinal disks due to aging. A condition that affects a muscle in the butt. Extra bone growth near the sciatic nerve. A break (fracture) of the area between your hip bones (pelvis). Pregnancy. Tumor. This is rare. What increases the risk? You are more likely to develop this condition if you: Play sports that put pressure or stress on the spine. Have poor strength and ease of movement (flexibility). Have had a back injury or back surgery. Sit for long periods of time. Do activities that involve bending or lifting over and over again. Are very overweight (obese). What are the signs or symptoms? Symptoms can vary from mild to very bad. They may include: Any of these problems in the lower back, leg, hip, or butt: Mild tingling, loss of feeling, or dull aches. A burning feeling. Sharp pains. Loss of feeling in the back of the calf or the sole of the foot. Leg weakness. Very bad back pain that makes it hard to move. These symptoms may get worse when you cough, sneeze, or laugh. They may also get worse when you sit or stand for long periods of time. How is this treated? This condition often gets better without any treatment. However, treatment may include: Changing or cutting back on physical activity when you have pain. Exercising, including strengthening and stretching. Putting ice or heat on the affected area. Shots of medicines to relieve pain and  swelling or to relax your muscles. Surgery. Follow these instructions at home: Medicines Take over-the-counter and prescription medicines only as told by your doctor. Ask your doctor if you should avoid driving or using machines while you are taking your medicine. Managing pain     If told, put ice on the affected area. To do this: Put ice in a plastic bag. Place a towel between your skin and the bag. Leave the ice on for 20 minutes, 2-3 times a day. If your skin turns bright red, take off the ice right away to prevent skin damage. The risk of skin damage is higher if you cannot feel pain, heat, or cold. If told, put heat on the affected area. Do this as often as told by your doctor. Use the heat source that your doctor tells you to use, such as a moist heat pack or a heating pad. Place a towel between your skin and the heat source. Leave the heat on for 20-30 minutes. If your skin turns bright red, take off the heat right away to prevent burns. The risk of burns is higher if you cannot feel pain, heat, or cold. Activity  Return to your normal activities when your doctor says that it is safe. Avoid activities that make your symptoms worse. Take short rests during the day. When you rest for a long time, do some physical activity or stretching between periods of rest. Avoid sitting for a long time without moving. Get up and move around at least one time each   hour. Do exercises and stretches as told by your doctor. Do not lift anything that is heavier than 10 lb (4.5 kg). Avoid lifting heavy things even when you do not have symptoms. Avoid lifting heavy things over and over. When you lift objects, always lift in a way that is safe for your body. To do this, you should: Bend your knees. Keep the object close to your body. Avoid twisting. General instructions Stay at a healthy weight. Wear comfortable shoes that support your feet. Avoid wearing high heels. Avoid sleeping on a mattress  that is too soft or too hard. You might have less pain if you sleep on a mattress that is firm enough to support your back. Contact a doctor if: Your pain is not controlled by medicine. Your pain does not get better. Your pain gets worse. Your pain lasts longer than 4 weeks. You lose weight without trying. Get help right away if: You cannot control when you pee (urinate) or poop (have a bowel movement). You have weakness in any of these areas and it gets worse: Lower back. The area between your hip bones. Butt. Legs. You have redness or swelling of your back. You have a burning feeling when you pee. Summary Sciatica is pain, weakness, tingling, or loss of feeling (numbness) along the sciatic nerve. This may include the lower back, legs, hips, and butt. This condition happens when the sciatic nerve is pinched or has pressure put on it. Treatment often includes rest, exercise, medicines, and putting ice or heat on the affected area. This information is not intended to replace advice given to you by your health care provider. Make sure you discuss any questions you have with your health care provider. Document Revised: 12/14/2021 Document Reviewed: 12/14/2021 Elsevier Patient Education  2024 Elsevier Inc.  

## 2023-08-24 ENCOUNTER — Telehealth: Payer: Self-pay

## 2023-08-24 ENCOUNTER — Other Ambulatory Visit (HOSPITAL_COMMUNITY): Payer: Self-pay

## 2023-08-24 NOTE — Telephone Encounter (Signed)
Pharmacy Patient Advocate Encounter  Received notification from [employer] that Prior Authorization for Ozempic (1 MG/DOSE) 4MG /3ML pen-injectors has been DENIED.  See denial reason below. No denial letter attached in CMM. Will attach denial letter to Media tab once received.   PA #/Case ID/Reference #: [id number]

## 2023-08-24 NOTE — Telephone Encounter (Signed)
Pharmacy Patient Advocate Encounter   Received notification from Pt Calls Messages that prior authorization for Ozempic (1 MG/DOSE) 4MG /3ML pen-injectors is required/requested.   Insurance verification completed.   The patient is insured through [employer] .   Per test claim: PA required; PA submitted to above mentioned insurance via CoverMyMeds Key/confirmation #/EOC [id number] Status is pending

## 2023-08-24 NOTE — Telephone Encounter (Signed)
Pharmacy Patient Advocate Encounter  Received notification from [employer] that Prior Authorization for Ozempic (1 MG/DOSE) 4MG /3ML pen-injectors  has been APPROVED from 08/24/23 to 08/23/24. Unable to obtain price due to refill too soon rejection, last fill date 08/15/23 next available fill date12/17/24   PA #/Case ID/Reference #: [id number]

## 2023-08-24 NOTE — Telephone Encounter (Signed)
Left message advising PA approved and to call back with any further questions or concerns.

## 2023-08-24 NOTE — Telephone Encounter (Signed)
Resubmitted PA with documentation that patient has failed Trulicity. New Encounter created for follow up. For additional info see Pharmacy Prior Auth telephone encounter from 08/24/23.

## 2023-09-05 ENCOUNTER — Other Ambulatory Visit: Payer: Self-pay | Admitting: Nurse Practitioner

## 2023-09-05 DIAGNOSIS — K219 Gastro-esophageal reflux disease without esophagitis: Secondary | ICD-10-CM

## 2023-09-09 ENCOUNTER — Encounter: Payer: Self-pay | Admitting: Pharmacist

## 2023-09-15 ENCOUNTER — Ambulatory Visit (INDEPENDENT_AMBULATORY_CARE_PROVIDER_SITE_OTHER): Payer: Commercial Managed Care - HMO | Admitting: Family Medicine

## 2023-09-15 ENCOUNTER — Other Ambulatory Visit (HOSPITAL_BASED_OUTPATIENT_CLINIC_OR_DEPARTMENT_OTHER): Payer: Self-pay

## 2023-09-15 VITALS — BP 139/70 | HR 58 | Temp 97.6°F | Ht 63.0 in | Wt 256.6 lb

## 2023-09-15 DIAGNOSIS — M62830 Muscle spasm of back: Secondary | ICD-10-CM

## 2023-09-15 MED ORDER — CYCLOBENZAPRINE HCL 5 MG PO TABS: 5.0000 mg | ORAL_TABLET | Freq: Three times a day (TID) | ORAL | 1 refills | Status: DC | PRN

## 2023-09-15 MED ORDER — PREDNISONE 20 MG PO TABS: 40.0000 mg | ORAL_TABLET | Freq: Every day | ORAL | 0 refills | Status: AC

## 2023-09-15 NOTE — Progress Notes (Signed)
Subjective:  Patient ID: Kathryn Merritt, female    DOB: 28-Dec-1960, 62 y.o.   MRN: [id number]  Patient Care Team: Bennie Pierini, FNP as PCP - General (Family Medicine) Quintella Reichert, MD as PCP - Sleep Medicine (Cardiology) Rollene Rotunda, MD as PCP - Cardiology (Cardiology)   Chief Complaint:  Back Pain (ON RIGHT SIDE ON 09/06/23 WAS SITTING IN CHAIR REACHED AND SHE HEARD A POP)   HPI: Kathryn Merritt is a 62 y.o. female presenting on 09/15/2023 for Back Pain (ON RIGHT SIDE ON 09/06/23 WAS SITTING IN CHAIR REACHED AND SHE HEARD A POP)   Discussed the use of AI scribe software for clinical note transcription with the patient, who gave verbal consent to proceed.  History of Present Illness   The patient, with a history of taking Eliquis, presented with a complaint of lower back pain that started a week ago. The pain was initiated by a 'pop' sensation in the right lower back while reaching for an object from a seated position. The pain has persisted and is described as excruciating with certain movements. The patient also reported instances of the right side 'giving out' while walking, leading to a sudden catch and slip. There were no associated symptoms of numbness, tingling in the legs, loss of bowel or bladder function, or saddle numbness in the genital area.  The patient attempted self-management with Salonpas patches, Voltaren cream, Tylenol, and Advil, with Advil providing the most relief. However, due to the known interaction between Advil and Eliquis, the patient decided to stop taking Eliquis for the past four to five days.  The patient also reported difficulty standing up straight, particularly in the shower, and a tendency to crouch or lean back. This was associated with a pulling sensation.  The patient's work schedule was affected by the condition, with the patient expressing concern about the ability to work while taking prescribed muscle relaxers. The patient was  advised to take a day off work to rest and allow the medication to take effect.        Relevant past medical, surgical, family, and social history reviewed and updated as indicated.  Allergies and medications reviewed and updated. Data reviewed: Chart in Epic.   Past Medical History:  Diagnosis Date   Chronic back pain    Diabetes mellitus    Edema leg    Hyperlipidemia    Hypertension    Joint pain    Kidney stones    Other malaise and fatigue    PAF (paroxysmal atrial fibrillation) (HCC)    a. new diagnosis in 02/2021 --> s/p DCCV on 03/18/2021   RBBB    Sinus bradycardia    Sleep apnea     Past Surgical History:  Procedure Laterality Date   BALLOON DILATION N/A 02/22/2022   Procedure: BALLOON DILATION;  Surgeon: Lanelle Bal, DO;  Location: [facility];  Service: Endoscopy;  Laterality: N/A;   BIOPSY  02/22/2022   Procedure: BIOPSY;  Surgeon: Lanelle Bal, DO;  Location: [facility];  Service: Endoscopy;;   CARDIOVERSION N/A 03/18/2021   Procedure: CARDIOVERSION;  Surgeon: Jonelle Sidle, MD;  Location: [facility];  Service: Cardiovascular;  Laterality: N/A;   CESAREAN SECTION     times 2   COLONOSCOPY WITH PROPOFOL N/A 02/22/2022   Procedure: COLONOSCOPY WITH PROPOFOL;  Surgeon: Lanelle Bal, DO;  Location: [facility];  Service: Endoscopy;  Laterality: N/A;  7:30am   CORONARY ANGIOGRAPHY N/A 06/09/2022  Procedure: CORONARY ANGIOGRAPHY;  Surgeon: Orbie Pyo, MD;  Location: [facility];  Service: Cardiovascular;  Laterality: N/A;   ESOPHAGOGASTRODUODENOSCOPY (EGD) WITH PROPOFOL N/A 02/22/2022   Procedure: ESOPHAGOGASTRODUODENOSCOPY (EGD) WITH PROPOFOL;  Surgeon: Lanelle Bal, DO;  Location: [facility];  Service: Endoscopy;  Laterality: N/A;   INCISION AND DRAINAGE ABSCESS Right    axilla   LITHOTRIPSY     POLYPECTOMY  02/22/2022   Procedure: POLYPECTOMY;  Surgeon: Lanelle Bal, DO;  Location: [facility];  Service: Endoscopy;;     Social History   Socioeconomic History   Marital status: Married    Spouse name: Not on file   Number of children: 2   Years of education: Not on file   Highest education level: 12th grade  Occupational History   Occupation: Engineer, maintenance (IT)- Teacher  Tobacco Use   Smoking status: Never   Smokeless tobacco: Never  Vaping Use   Vaping status: Never Used  Substance and Sexual Activity   Alcohol use: No   Drug use: No   Sexual activity: Not Currently  Other Topics Concern   Not on file  Social History Narrative   Live with her husband, works at [employer] center,    Social Drivers of Health   Financial Resource Strain: Medium Risk (09/15/2023)   Overall Financial Resource Strain (CARDIA)    Difficulty of Paying Living Expenses: Somewhat hard  Food Insecurity: No Food Insecurity (09/15/2023)   Hunger Vital Sign    Worried About Running Out of Food in the Last Year: Never true    Ran Out of Food in the Last Year: Never true  Recent Concern: Food Insecurity - Food Insecurity Present (08/01/2023)   Hunger Vital Sign    Worried About Running Out of Food in the Last Year: Sometimes true    Ran Out of Food in the Last Year: Sometimes true  Transportation Needs: No Transportation Needs (09/15/2023)   PRAPARE - Administrator, Civil Service (Medical): No    Lack of Transportation (Non-Medical): No  Physical Activity: Inactive (09/15/2023)   Exercise Vital Sign    Days of Exercise per Week: 0 days    Minutes of Exercise per Session: 10 min  Stress: Stress Concern Present (09/15/2023)   [employer] of Occupational Health - Occupational Stress Questionnaire    Feeling of Stress : To some extent  Social Connections: Moderately Isolated (09/15/2023)   Social Connection and Isolation Panel [NHANES]    Frequency of Communication with Friends and Family: Twice a week    Frequency of Social Gatherings with Friends and Family: Once a week     Attends Religious Services: Never    Database administrator or Organizations: No    Attends Engineer, structural: Not on file    Marital Status: Married  Catering manager Violence: Not At Risk (06/08/2022)   Humiliation, Afraid, Rape, and Kick questionnaire    Fear of Current or Ex-Partner: No    Emotionally Abused: No    Physically Abused: No    Sexually Abused: No    Outpatient Encounter Medications as of 09/15/2023  Medication Sig   acetaminophen (TYLENOL) 500 MG tablet Take 1,000 mg by mouth every 8 (eight) hours as needed for moderate pain.   albuterol (VENTOLIN HFA) 108 (90 Base) MCG/ACT inhaler Inhale 2 puffs into the lungs every 6 (six) hours as needed for wheezing or shortness of breath.   amLODipine (NORVASC) 5  MG tablet Take 1 tablet (5 mg total) by mouth daily.   apixaban (ELIQUIS) 5 MG TABS tablet Take 1 tablet (5 mg total) by mouth 2 (two) times daily.   blood glucose meter kit and supplies Dispense based on patient and insurance preference. Use up to four times daily as directed. (FOR ICD-10 E10.9, E11.9).   cetirizine (ZYRTEC ALLERGY) 10 MG tablet Take 1 tablet (10 mg total) by mouth daily.   Cholecalciferol (VITAMIN D3) 50 MCG (2000 UT) capsule Take 1 capsule (2,000 Units total) by mouth daily.   cyclobenzaprine (FLEXERIL) 5 MG tablet Take 1 tablet (5 mg total) by mouth 3 (three) times daily as needed for muscle spasms.   fluticasone (FLONASE) 50 MCG/ACT nasal spray Place 2 sprays into both nostrils daily.   glipiZIDE (GLUCOTROL) 5 MG tablet Take 1 tablet (5 mg total) by mouth 2 (two) times daily before a meal.   lisinopril (ZESTRIL) 20 MG tablet Take 1 tablet (20 mg total) by mouth daily.   lubiprostone (AMITIZA) 24 MCG capsule Take 1 capsule (24 mcg total) by mouth 2 (two) times daily.   pantoprazole (PROTONIX) 40 MG tablet Take 1 tablet (40 mg total) by mouth daily.   predniSONE (DELTASONE) 20 MG tablet Take 2 tablets (40 mg total) by mouth daily with breakfast  for 5 days.   rosuvastatin (CRESTOR) 10 MG tablet Take 1 tablet (10 mg total) by mouth daily.   Semaglutide, 1 MG/DOSE, 4 MG/3ML SOPN Inject 1 mg as directed once a week.   torsemide (DEMADEX) 100 MG tablet Take 1 tablet (100 mg total) by mouth daily.   No facility-administered encounter medications on file as of 09/15/2023.    Allergies  Allergen Reactions   Metformin And Related     Made patient feel bad/GI    Pertinent ROS per HPI, otherwise unremarkable      Objective:  BP 139/70   Pulse (!) 58   Temp 97.6 F (36.4 C) (Temporal)   Ht 5\' 3"  (1.6 m)   Wt 256 lb 9.6 oz (116.4 kg)   SpO2 99%   BMI 45.45 kg/m    Wt Readings from Last 3 Encounters:  09/15/23 256 lb 9.6 oz (116.4 kg)  08/23/23 253 lb (114.8 kg)  08/01/23 250 lb (113.4 kg)    Physical Exam Vitals and nursing note reviewed.  Constitutional:      General: She is in acute distress (uncomfortable).     Appearance: Normal appearance. She is morbidly obese.  HENT:     Head: Normocephalic and atraumatic.     Nose: Nose normal.     Mouth/Throat:     Mouth: Mucous membranes are moist.  Eyes:     Conjunctiva/sclera: Conjunctivae normal.     Pupils: Pupils are equal, round, and reactive to light.  Cardiovascular:     Rate and Rhythm: Normal rate and regular rhythm.     Heart sounds: Normal heart sounds.  Pulmonary:     Effort: Pulmonary effort is normal.     Breath sounds: Normal breath sounds.  Musculoskeletal:     Cervical back: Normal and neck supple.     Thoracic back: Normal.     Lumbar back: Spasms and tenderness present. No swelling, edema, deformity, signs of trauma, lacerations or bony tenderness. Decreased range of motion. Negative right straight leg raise test and negative left straight leg raise test. No scoliosis.       Back:     Right hip: Normal.  Left hip: Normal.  Skin:    General: Skin is warm and dry.     Capillary Refill: Capillary refill takes less than 2 seconds.   Neurological:     General: No focal deficit present.     Mental Status: She is alert and oriented to person, place, and time.  Psychiatric:        Mood and Affect: Mood normal.        Behavior: Behavior normal. Behavior is cooperative.        Thought Content: Thought content normal.        Judgment: Judgment normal.      Results for orders placed or performed in visit on 08/01/23  Bayer DCA Hb A1c Waived   Collection Time: 08/01/23  9:19 AM  Result Value Ref Range   HB A1C (BAYER DCA - WAIVED) 9.3 (H) 4.8 - 5.6 %  CBC with Differential/Platelet   Collection Time: 08/01/23  9:23 AM  Result Value Ref Range   WBC 6.4 3.4 - 10.8 x10E3/uL   RBC 4.44 3.77 - 5.28 x10E6/uL   Hemoglobin 13.1 11.1 - 15.9 g/dL   Hematocrit 82.9 56.2 - 46.6 %   MCV 93 79 - 97 fL   MCH 29.5 26.6 - 33.0 pg   MCHC 31.8 31.5 - 35.7 g/dL   RDW 13.0 86.5 - 78.4 %   Platelets 256 150 - 450 x10E3/uL   Neutrophils 51 Not Estab. %   Lymphs 39 Not Estab. %   Monocytes 7 Not Estab. %   Eos 2 Not Estab. %   Basos 1 Not Estab. %   Neutrophils Absolute 3.3 1.4 - 7.0 x10E3/uL   Lymphocytes Absolute 2.5 0.7 - 3.1 x10E3/uL   Monocytes Absolute 0.4 0.1 - 0.9 x10E3/uL   EOS (ABSOLUTE) 0.1 0.0 - 0.4 x10E3/uL   Basophils Absolute 0.1 0.0 - 0.2 x10E3/uL   Immature Granulocytes 0 Not Estab. %   Immature Grans (Abs) 0.0 0.0 - 0.1 x10E3/uL  CMP14+EGFR   Collection Time: 08/01/23  9:23 AM  Result Value Ref Range   Glucose 180 (H) 70 - 99 mg/dL   BUN 21 8 - 27 mg/dL   Creatinine, Ser 6.96 (H) 0.57 - 1.00 mg/dL   eGFR 45 (L) >29 BM/WUX/3.24   BUN/Creatinine Ratio 16 12 - 28   Sodium 143 134 - 144 mmol/L   Potassium 4.1 3.5 - 5.2 mmol/L   Chloride 107 (H) 96 - 106 mmol/L   CO2 22 20 - 29 mmol/L   Calcium 10.0 8.7 - 10.3 mg/dL   Total Protein 6.0 6.0 - 8.5 g/dL   Albumin 3.9 3.9 - 4.9 g/dL   Globulin, Total 2.1 1.5 - 4.5 g/dL   Bilirubin Total 0.3 0.0 - 1.2 mg/dL   Alkaline Phosphatase 78 44 - 121 IU/L   AST 14 0 -  40 IU/L   ALT 16 0 - 32 IU/L  Lipid panel   Collection Time: 08/01/23  9:23 AM  Result Value Ref Range   Cholesterol, Total 139 100 - 199 mg/dL   Triglycerides 401 0 - 149 mg/dL   HDL 52 >02 mg/dL   VLDL Cholesterol Cal 20 5 - 40 mg/dL   LDL Chol Calc (NIH) 67 0 - 99 mg/dL   Chol/HDL Ratio 2.7 0.0 - 4.4 ratio       Pertinent labs & imaging results that were available during my care of the patient were reviewed by me and considered in my medical decision making.  Assessment &  Plan:  Ileah was seen today for back pain.  Diagnoses and all orders for this visit:  Spasm of muscle of lower back -     predniSONE (DELTASONE) 20 MG tablet; Take 2 tablets (40 mg total) by mouth daily with breakfast for 5 days. -     cyclobenzaprine (FLEXERIL) 5 MG tablet; Take 1 tablet (5 mg total) by mouth 3 (three) times daily as needed for muscle spasms.     Assessment and Plan    Right Lower Back Muscle Strain with Spasm Acute onset of right lower back pain following a popping sensation while reaching. Pain exacerbated by certain movements and walking, with occasional giving out of the right side. No numbness, tingling, or loss of bowel/bladder function. Physical exam reveals tenderness and tightness in the right lower back. Likely muscle strain with significant spasm. Discussed prednisone for its anti-inflammatory properties, safe with Eliquis. Muscle relaxers may cause drowsiness; advised caution. Emphasized moist heat and topical treatments like Biofreeze for relief. - Prescribe prednisone for anti-inflammatory effect - Prescribe low dose Flexeril up to three times a day as needed - Advise use of moist heat to the affected area - Recommend Biofreeze, Bengay, or Icy Hot for topical relief - Instruct to avoid NSAIDs and continue Eliquis - Provide a work note to take the next day off and return to work on Monday - Advise to contact if not significantly better by Monday or if symptoms worsen  Tension  Headache Severe headaches by nightfall, likely secondary to muscle tension from back pain. Muscle relaxers may provide dual benefit for back pain and tension headaches. Advised rest and use of prescribed medications for the next 24 hours to alleviate symptoms. - Prescribe low dose Flexeril up to three times a day as needed - Advise rest and use of prescribed medications for the next 24 hours  Anticoagulation Management Has been taking Advil for pain relief and stopped Eliquis for the past 4-5 days. Discussed the importance of continuing Eliquis to prevent stroke and other complications. Explained that NSAIDs should be avoided due to their interaction with Eliquis. - Instruct to resume taking Eliquis immediately - Avoid NSAIDs due to interaction with Eliquis  Follow-up - Advise to contact if not significantly better by Monday or if symptoms worsen.          Continue all other maintenance medications.  Follow up plan: Return if symptoms worsen or fail to improve.   Continue healthy lifestyle choices, including diet (rich in fruits, vegetables, and lean proteins, and low in salt and simple carbohydrates) and exercise (at least 30 minutes of moderate physical activity daily).  Educational handout given for muscle cramps / spasms  The above assessment and management plan was discussed with the patient. The patient verbalized understanding of and has agreed to the management plan. Patient is aware to call the clinic if they develop any new symptoms or if symptoms persist or worsen. Patient is aware when to return to the clinic for a follow-up visit. Patient educated on when it is appropriate to go to the emergency department.   Kari Baars, FNP-C [facility] [phone]

## 2023-09-17 ENCOUNTER — Other Ambulatory Visit: Payer: Self-pay | Admitting: Nurse Practitioner

## 2023-09-17 DIAGNOSIS — E119 Type 2 diabetes mellitus without complications: Secondary | ICD-10-CM

## 2023-09-19 ENCOUNTER — Telehealth: Payer: Self-pay | Admitting: Cardiology

## 2023-09-19 ENCOUNTER — Other Ambulatory Visit: Payer: Self-pay | Admitting: Nurse Practitioner

## 2023-09-19 DIAGNOSIS — I480 Paroxysmal atrial fibrillation: Secondary | ICD-10-CM

## 2023-09-19 MED ORDER — APIXABAN 5 MG PO TABS: 5.0000 mg | ORAL_TABLET | Freq: Two times a day (BID) | ORAL | 1 refills | Status: DC

## 2023-09-19 NOTE — Telephone Encounter (Signed)
*  STAT* If patient is at the pharmacy, call can be transferred to refill team.   1. Which medications need to be refilled? (please list name of each medication and dose if known)  apixaban (ELIQUIS) 5 MG TABS tablet  2. Which pharmacy/location (including street and city if local pharmacy) is medication to be sent to? [facility] - [place], [place] - [address]  3. Do they need a 30 day or 90 day supply?   90 day supply  Patient states she is completely out of medication.

## 2023-09-22 ENCOUNTER — Ambulatory Visit: Payer: Commercial Managed Care - HMO | Admitting: Nurse Practitioner

## 2023-09-22 ENCOUNTER — Encounter: Payer: Self-pay | Admitting: Nurse Practitioner

## 2023-09-22 ENCOUNTER — Ambulatory Visit: Admission: EM | Admit: 2023-09-22 | Payer: Commercial Managed Care - HMO | Source: Home / Self Care

## 2023-09-22 VITALS — BP 133/81 | HR 71 | Temp 97.0°F | Ht 63.0 in | Wt 253.0 lb

## 2023-09-22 DIAGNOSIS — H6502 Acute serous otitis media, left ear: Secondary | ICD-10-CM

## 2023-09-22 MED ORDER — AMOXICILLIN-POT CLAVULANATE 875-125 MG PO TABS: 1.0000 | ORAL_TABLET | Freq: Two times a day (BID) | ORAL | 0 refills | Status: DC

## 2023-09-22 NOTE — Progress Notes (Signed)
 Subjective:    Patient ID: Kathryn Merritt, female    DOB: 1961/07/04, 63 y.o.   MRN: [id number]   Chief Complaint: Headache, Ear Pain (Left ear/), and Nasal Congestion   Otalgia  There is pain in the left ear. This is a new problem. The current episode started in the past 7 days. The problem occurs every few minutes. The problem has been waxing and waning. There has been no fever. The pain is at a severity of 7/10. The pain is severe. Associated symptoms include headaches and rhinorrhea. Pertinent negatives include no coughing or sore throat. She has tried acetaminophen  for the symptoms. The treatment provided mild relief.    Patient Active Problem List   Diagnosis Date Noted   Diabetes mellitus treated with injections of non-insulin  medication (HCC) 08/01/2023   Viral URI 07/20/2023   Hypercoagulable state due to paroxysmal atrial fibrillation (HCC) 03/29/2023   Lesion of left ear 06/24/2022   Vitamin D  deficiency 01/21/2022   Stage 3a chronic kidney disease (HCC) 01/21/2022   Constipation due to outlet dysfunction 01/13/2022   Esophageal dysphagia 01/13/2022   Subclinical hypothyroidism 10/23/2021   Gastroesophageal reflux disease 07/16/2021   OSA (obstructive sleep apnea) 07/14/2021   Hypertension 06/18/2021   Hyperlipidemia associated with type 2 diabetes mellitus (HCC) 06/18/2021   Paroxysmal atrial fibrillation (HCC) 03/18/2021   Morbid obesity (HCC) 03/18/2021   Controlled type 2 diabetes mellitus without complication, without long-term current use of insulin  (HCC) 03/18/2021       Review of Systems  Constitutional:  Negative for chills and fever.  HENT:  Positive for ear pain and rhinorrhea. Negative for sore throat.   Eyes: Negative.   Respiratory:  Negative for cough.   Gastrointestinal: Negative.   Neurological:  Positive for headaches.       Objective:   Physical Exam Constitutional:      Appearance: She is well-developed.  HENT:     Right Ear: Tympanic  membrane normal.     Left Ear: Tympanic membrane is injected and erythematous.     Nose: Congestion and rhinorrhea present.     Mouth/Throat:     Pharynx: No oropharyngeal exudate or posterior oropharyngeal erythema.  Eyes:     Pupils: Pupils are equal, round, and reactive to light.  Cardiovascular:     Rate and Rhythm: Normal rate and regular rhythm.     Heart sounds: Normal heart sounds.  Pulmonary:     Effort: Pulmonary effort is normal.     Breath sounds: Normal breath sounds.  Skin:    General: Skin is warm.  Neurological:     General: No focal deficit present.     Mental Status: She is alert and oriented to person, place, and time.  Psychiatric:        Mood and Affect: Mood normal.        Behavior: Behavior normal.    BP 133/81   Pulse 71   Temp (!) 97 F (36.1 C) (Temporal)   Ht 5' 3 (1.6 m)   Wt 253 lb (114.8 kg)   SpO2 99%   BMI 44.82 kg/m         Assessment & Plan:   JANERA PEUGH in today with chief complaint of Headache, Ear Pain (Left ear/), and Nasal Congestion   1. Non-recurrent acute serous otitis media of left ear (Primary) Force fluids Motrin  or tylenol  OTC Take all meds  Meds ordered this encounter  Medications   amoxicillin -clavulanate (AUGMENTIN ) 875-125 MG tablet  Sig: Take 1 tablet by mouth 2 (two) times daily.    Dispense:  20 tablet    Refill:  0    Supervising Provider:   MARYANNE CHEW A [[id number]]       The above assessment and management plan was discussed with the patient. The patient verbalized understanding of and has agreed to the management plan. Patient is aware to call the clinic if symptoms persist or worsen. Patient is aware when to return to the clinic for a follow-up visit. Patient educated on when it is appropriate to go to the emergency department.   Mary-Margaret Gladis, FNP

## 2023-09-22 NOTE — Patient Instructions (Signed)

## 2023-09-23 ENCOUNTER — Ambulatory Visit: Payer: Self-pay | Admitting: Nurse Practitioner

## 2023-09-23 NOTE — Telephone Encounter (Signed)
 Copied from CRM [id number]. Topic: Clinical - Red Word Triage >> Sep 23, 2023 11:48 AM Graeme ORN wrote: Red Word that prompted transfer to Nurse Triage: Excruciating pain in ear Level 10 per patient   Chief Complaint: Ear pain Symptoms: Left ear pain Frequency: Constant  Pertinent Negatives: Patient denies fever, ear drainage  Disposition: [] ED /[x] Urgent Care (no appt availability in office) / [] Appointment(In office/virtual)/ []  [facility]/ [] Home Care/ [] Refused Recommended Disposition /[] [facility]/ []  Follow-up with PCP Additional Notes: Patient reports she was seen yesterday in the office and prescribed Augmentin  for a left ear infection. She reports that she started the antibiotic today but that her ear pain has been worsening, rating it as 10/10. The patient sent a message to the clinic yesterday asking about an ear drop for the pain. I advised that the request has been forwarded to her PCP and explained that it can sometimes take 2-3 days for a medication request. Patient advised that if she needs intervention before the clinic processes her request that she can follow up with Urgent Care for evaluation and treatment. Patient verbalized understanding of this.     Reason for Disposition  [1] SEVERE pain and [2] not improved 2 hours after analgesic medication (e.g., ibuprofen  or acetaminophen )  Answer Assessment - Initial Assessment Questions 1. ANTIBIOTIC: What antibiotic are you taking? How many times per day?     Augmentin   2. ONSET: When was the antibiotic started?     This morning  3. LOCATION: Which ear is involved?     Left ear  4. PAIN: How bad is the pain?   (Scale 1-10; mild, moderate or severe)   - MILD (1-3): doesn't interfere with normal activities    - MODERATE (4-7): interferes with normal activities or awakens from sleep    - SEVERE (8-10): excruciating pain, unable to do any normal activities      10/10 5. FEVER: Do you have a  fever? If Yes, ask: What is your temperature, how was it measured, and when did it start?     No 6. DISCHARGE: Is there any discharge from the ear?     No 7. OTHER SYMPTOMS: Do you have any other symptoms? (e.g., headache, stiff neck, dizziness, vomiting, runny nose)     Nasal congestion  Protocols used: Ear - Otitis Media Follow-up Call-A-AH

## 2023-09-30 ENCOUNTER — Telehealth: Payer: Self-pay | Admitting: Family Medicine

## 2023-09-30 ENCOUNTER — Encounter: Payer: Self-pay | Admitting: Emergency Medicine

## 2023-09-30 ENCOUNTER — Ambulatory Visit
Admission: EM | Admit: 2023-09-30 | Discharge: 2023-09-30 | Disposition: A | Discharge status: Home / Self Care | Payer: Commercial Managed Care - HMO | Attending: Family Medicine | Admitting: Family Medicine

## 2023-09-30 DIAGNOSIS — H6993 Unspecified Eustachian tube disorder, bilateral: Secondary | ICD-10-CM

## 2023-09-30 DIAGNOSIS — J3089 Other allergic rhinitis: Secondary | ICD-10-CM | POA: Diagnosis not present

## 2023-09-30 DIAGNOSIS — H6122 Impacted cerumen, left ear: Secondary | ICD-10-CM | POA: Diagnosis not present

## 2023-09-30 MED ORDER — CORICIDIN HBP 10-325-2 MG PO TABS: 1.0000 | ORAL_TABLET | Freq: Three times a day (TID) | ORAL | 0 refills | Status: DC | PRN

## 2023-09-30 MED ORDER — CARBAMIDE PEROXIDE 6.5 % OT SOLN: 5.0000 [drp] | Freq: Two times a day (BID) | OTIC | 0 refills | Status: DC

## 2023-09-30 NOTE — Telephone Encounter (Deleted)
 Patient still has three days of antibiotics left and was previously advised to do Debrox.  Do you want to do anything differently or do a video visit with patient?

## 2023-09-30 NOTE — Discharge Instructions (Addendum)
 Take Zyrtec  once daily and Flonase  twice daily consistently to keep allergy inflammation and drainage at bay.  In order to help with the inner ear pressure that is bothering you you may also add Coricidin HBP as needed as a decongestant.  I have also sent in wax softening drops to help clear out the cerumen debris in your left ear

## 2023-09-30 NOTE — Telephone Encounter (Signed)
 Copied from CRM [id number]. Topic: Clinical - Medical Advice >> Sep 30, 2023  8:26 AM Powell B wrote: Reason for CRM: Patient calling was seen 2 weeks ago for an ear infection in Left ear. Still experiencing pain and echoing sounds, would like to know if can be seen for appointment today or if PCP would send in another medication.

## 2023-09-30 NOTE — Telephone Encounter (Signed)
 Patient not called because she has been to urgent care with this problem today, also see patient message from earlier.

## 2023-09-30 NOTE — ED Triage Notes (Signed)
 Can't hear out of left ear x 1.5 weeks.  States she is currently being treated with Augmentin for an ear infection.  States right ear is started to feel the same.  States she does have a slight headache.

## 2023-09-30 NOTE — ED Provider Notes (Signed)
 [facility]    CSN: [id number] Arrival date & time: 09/30/23  0931      History   Chief Complaint No chief complaint on file.   HPI Kathryn Merritt is a 63 y.o. female.   Patient presenting today with about a week and a half of muffled hearing to the left ear.  Starting to have similar symptoms in the right ear as well.  Treated for a left ear infection about 8 days ago with Augmentin , has 2 doses left and states it has helped a bit with the nasal congestion as it is now clear rather than colored per patient but has not resolved the ear issues.  Not taking anything else over-the-counter currently apart from Tylenol  as needed.  States when asked she has been prescribed Zyrtec  and Flonase  in the past but does not take it.  Denies fever, chills, drainage from the ears, chest pain, shortness of breath.    Past Medical History:  Diagnosis Date   Chronic back pain    Diabetes mellitus    Edema leg    Hyperlipidemia    Hypertension    Joint pain    Kidney stones    Other malaise and fatigue    PAF (paroxysmal atrial fibrillation) (HCC)    a. new diagnosis in 02/2021 --> s/p DCCV on 03/18/2021   RBBB    Sinus bradycardia    Sleep apnea     Patient Active Problem List   Diagnosis Date Noted   Diabetes mellitus treated with injections of non-insulin  medication (HCC) 08/01/2023   Viral URI 07/20/2023   Hypercoagulable state due to paroxysmal atrial fibrillation (HCC) 03/29/2023   Lesion of left ear 06/24/2022   Vitamin D  deficiency 01/21/2022   Stage 3a chronic kidney disease (HCC) 01/21/2022   Constipation due to outlet dysfunction 01/13/2022   Esophageal dysphagia 01/13/2022   Subclinical hypothyroidism 10/23/2021   Gastroesophageal reflux disease 07/16/2021   OSA (obstructive sleep apnea) 07/14/2021   Hypertension 06/18/2021   Hyperlipidemia associated with type 2 diabetes mellitus (HCC) 06/18/2021   Paroxysmal atrial fibrillation (HCC) 03/18/2021   Morbid  obesity (HCC) 03/18/2021   Controlled type 2 diabetes mellitus without complication, without long-term current use of insulin  (HCC) 03/18/2021    Past Surgical History:  Procedure Laterality Date   BALLOON DILATION N/A 02/22/2022   Procedure: BALLOON DILATION;  Surgeon: Cindie Carlin POUR, DO;  Location: [facility];  Service: Endoscopy;  Laterality: N/A;   BIOPSY  02/22/2022   Procedure: BIOPSY;  Surgeon: Cindie Carlin POUR, DO;  Location: [facility];  Service: Endoscopy;;   CARDIOVERSION N/A 03/18/2021   Procedure: CARDIOVERSION;  Surgeon: Debera Jayson MATSU, MD;  Location: [facility];  Service: Cardiovascular;  Laterality: N/A;   CESAREAN SECTION     times 2   COLONOSCOPY WITH PROPOFOL  N/A 02/22/2022   Procedure: COLONOSCOPY WITH PROPOFOL ;  Surgeon: Cindie Carlin POUR, DO;  Location: [facility];  Service: Endoscopy;  Laterality: N/A;  7:30am   CORONARY ANGIOGRAPHY N/A 06/09/2022   Procedure: CORONARY ANGIOGRAPHY;  Surgeon: Wendel Lurena POUR, MD;  Location: [facility];  Service: Cardiovascular;  Laterality: N/A;   ESOPHAGOGASTRODUODENOSCOPY (EGD) WITH PROPOFOL  N/A 02/22/2022   Procedure: ESOPHAGOGASTRODUODENOSCOPY (EGD) WITH PROPOFOL ;  Surgeon: Cindie Carlin POUR, DO;  Location: [facility];  Service: Endoscopy;  Laterality: N/A;   INCISION AND DRAINAGE ABSCESS Right    axilla   LITHOTRIPSY     POLYPECTOMY  02/22/2022   Procedure: POLYPECTOMY;  Surgeon:  Cindie Carlin POUR, DO;  Location: [facility];  Service: Endoscopy;;    OB History   No obstetric history on file.      Home Medications    Prior to Admission medications   Medication Sig Start Date End Date Taking? Authorizing Provider  carbamide peroxide (DEBROX) 6.5 % OTIC solution Place 5 drops into the left ear 2 (two) times daily. 09/30/23  Yes Stuart Vernell Norris, PA-C  DM-APAP-CPM (CORICIDIN HBP) 10-325-2 MG TABS Take 1 tablet by mouth 3 (three) times daily as needed. 09/30/23  Yes Stuart Vernell Norris, PA-C   acetaminophen  (TYLENOL ) 500 MG tablet Take 1,000 mg by mouth every 8 (eight) hours as needed for moderate pain.    [provider]  albuterol  (VENTOLIN  HFA) 108 (90 Base) MCG/ACT inhaler Inhale 2 puffs into the lungs every 6 (six) hours as needed for wheezing or shortness of breath. 12/27/22   Gladis Mary-Margaret, FNP  amLODipine  (NORVASC ) 5 MG tablet Take 1 tablet (5 mg total) by mouth daily. 08/01/23   [name], FNP  amoxicillin -clavulanate (AUGMENTIN ) 875-125 MG tablet Take 1 tablet by mouth 2 (two) times daily. 09/22/23   Gladis, Mary-Margaret, FNP  apixaban  (ELIQUIS ) 5 MG TABS tablet Take 1 tablet (5 mg total) by mouth 2 (two) times daily. 09/19/23   Lavona Agent, MD  blood glucose meter kit and supplies Dispense based on patient and insurance preference. Use up to four times daily as directed. (FOR ICD-10 E10.9, E11.9). 10/01/21   Therisa Benton PARAS, NP  cetirizine  (ZYRTEC  ALLERGY) 10 MG tablet Take 1 tablet (10 mg total) by mouth daily. 07/20/23   Leath-Warren, Etta PARAS, NP  Cholecalciferol  (VITAMIN D3) 50 MCG (2000 UT) capsule Take 1 capsule (2,000 Units total) by mouth daily. 09/27/22   Ijaola, Onyeje M, NP  cyclobenzaprine  (FLEXERIL ) 5 MG tablet Take 1 tablet (5 mg total) by mouth 3 (three) times daily as needed for muscle spasms. 09/15/23   Severa Rock HERO, FNP  fluticasone  (FLONASE ) 50 MCG/ACT nasal spray Place 2 sprays into both nostrils daily. 07/20/23   [place] Sebastian Pool, NP  glipiZIDE  (GLUCOTROL ) 5 MG tablet Take 1 tablet (5 mg total) by mouth 2 (two) times daily before a meal. 09/19/23   Gladis, Mary-Margaret, FNP  lisinopril  (ZESTRIL ) 20 MG tablet Take 1 tablet (20 mg total) by mouth daily. 08/01/23   Gladis Mary-Margaret, FNP  lubiprostone  (AMITIZA ) 24 MCG capsule Take 1 capsule (24 mcg total) by mouth 2 (two) times daily. 08/01/23   Gladis, Mary-Margaret, FNP  pantoprazole  (PROTONIX ) 40 MG tablet Take 1 tablet (40 mg total) by mouth daily. 09/05/23    Gladis Mary-Margaret, FNP  rosuvastatin  (CRESTOR ) 10 MG tablet Take 1 tablet (10 mg total) by mouth daily. 08/01/23   Gladis, Mary-Margaret, FNP  Semaglutide , 1 MG/DOSE, 4 MG/3ML SOPN Inject 1 mg as directed once a week. 08/01/23   Gladis, Mary-Margaret, FNP  torsemide  (DEMADEX ) 100 MG tablet Take 1 tablet (100 mg total) by mouth daily. 08/01/23   [name], FNP    Family History Family History  Problem Relation Age of Onset   Hypertension Mother    Cancer Father    Hypertension Father    Cancer Brother    Heart disease Paternal Grandmother    Breast cancer Neg Hx    Colon cancer Neg Hx     Social History Social History   Tobacco Use   Smoking status: Never   Smokeless tobacco: Never  Vaping Use   Vaping status: Never  Used  Substance Use Topics   Alcohol use: No   Drug use: No     Allergies   Metformin  and related   Review of Systems Review of Systems Per HPI  Physical Exam Triage Vital Signs ED Triage Vitals  Encounter Vitals Group     BP 09/30/23 0937 (!) 151/89     Systolic BP Percentile --      Diastolic BP Percentile --      Pulse Rate 09/30/23 0937 86     Resp 09/30/23 0937 18     Temp 09/30/23 0937 97.7 F (36.5 C)     Temp Source 09/30/23 0937 Oral     SpO2 09/30/23 0937 95 %     Weight --      Height --      Head Circumference --      Peak Flow --      Pain Score 09/30/23 0938 0     Pain Loc --      Pain Education --      Exclude from Growth Chart --    No data found.  Updated Vital Signs BP (!) 151/89 (BP Location: Right Arm)   Pulse 86   Temp 97.7 F (36.5 C) (Oral)   Resp 18   SpO2 95%   Visual Acuity Right Eye Distance:   Left Eye Distance:   Bilateral Distance:    Right Eye Near:   Left Eye Near:    Bilateral Near:     Physical Exam Vitals and nursing note reviewed.  Constitutional:      Appearance: Normal appearance.  HENT:     Head: Atraumatic.     Right Ear: External ear normal.     Left Ear:  External ear normal.     Ears:     Comments: Bilateral middle ear effusions Cerumen debris present to left EAC, not impacted    Nose: Rhinorrhea present.     Mouth/Throat:     Mouth: Mucous membranes are moist.     Pharynx: No posterior oropharyngeal erythema.  Eyes:     Extraocular Movements: Extraocular movements intact.     Conjunctiva/sclera: Conjunctivae normal.  Cardiovascular:     Rate and Rhythm: Normal rate and regular rhythm.     Heart sounds: Normal heart sounds.  Pulmonary:     Effort: Pulmonary effort is normal.     Breath sounds: Normal breath sounds. No wheezing or rales.  Musculoskeletal:        General: Normal range of motion.     Cervical back: Normal range of motion and neck supple.  Skin:    General: Skin is warm and dry.  Neurological:     Mental Status: She is alert and oriented to person, place, and time.  Psychiatric:        Mood and Affect: Mood normal.        Thought Content: Thought content normal.      UC Treatments / Results  Labs (all labs ordered are listed, but only abnormal results are displayed) Labs Reviewed - No data to display  EKG   Radiology No results found.  Procedures Procedures (including critical care time)  Medications Ordered in UC Medications - No data to display  Initial Impression / Assessment and Plan / UC Course  I have reviewed the triage vital signs and the nursing notes.  Pertinent labs & imaging results that were available during my care of the patient were reviewed by me and considered in my  medical decision making (see chart for details).     No active bacterial infection at this time and also finishing a course of Augmentin  currently.  Suspect underlying uncontrolled seasonal allergies to be causing eustachian tube dysfunction which is causing her symptoms.  Start Zyrtec  and Flonase  consistently, discussed Coricidin HBP and sinus rinses as needed.  Debrox drops sent for cerumen debris to the left ear  canal though I do not feel this is causing much of her symptoms as it is not fully impacted.  Final Clinical Impressions(s) / UC Diagnoses   Final diagnoses:  Dysfunction of Eustachian tube, bilateral  Seasonal allergic rhinitis due to other allergic trigger  Cerumen debris on tympanic membrane, left     Discharge Instructions      Take Zyrtec  once daily and Flonase  twice daily consistently to keep allergy inflammation and drainage at bay.  In order to help with the inner ear pressure that is bothering you you may also add Coricidin HBP as needed as a decongestant.  I have also sent in wax softening drops to help clear out the cerumen debris in your left ear    ED Prescriptions     Medication Sig Dispense Auth. Provider   carbamide peroxide (DEBROX) 6.5 % OTIC solution Place 5 drops into the left ear 2 (two) times daily. 15 mL Stuart Vernell Norris, PA-C   DM-APAP-CPM (CORICIDIN HBP) 10-325-2 MG TABS Take 1 tablet by mouth 3 (three) times daily as needed. 21 tablet Stuart Vernell Norris, [place]      PDMP not reviewed this encounter.   Stuart Vernell Norris, [place] 09/30/23 1045

## 2023-10-12 ENCOUNTER — Other Ambulatory Visit (HOSPITAL_BASED_OUTPATIENT_CLINIC_OR_DEPARTMENT_OTHER): Payer: Self-pay

## 2023-10-13 ENCOUNTER — Other Ambulatory Visit (HOSPITAL_BASED_OUTPATIENT_CLINIC_OR_DEPARTMENT_OTHER): Payer: Self-pay

## 2023-10-17 ENCOUNTER — Other Ambulatory Visit (HOSPITAL_COMMUNITY): Payer: Self-pay

## 2023-10-20 ENCOUNTER — Ambulatory Visit: Payer: Commercial Managed Care - HMO | Admitting: Nurse Practitioner

## 2023-10-20 ENCOUNTER — Encounter: Payer: Self-pay | Admitting: Nurse Practitioner

## 2023-10-20 VITALS — BP 135/78 | HR 63 | Temp 98.5°F | Ht 63.0 in | Wt 257.0 lb

## 2023-10-20 DIAGNOSIS — J0101 Acute recurrent maxillary sinusitis: Secondary | ICD-10-CM

## 2023-10-20 MED ORDER — AMOXICILLIN-POT CLAVULANATE 875-125 MG PO TABS
1.0000 | ORAL_TABLET | Freq: Two times a day (BID) | ORAL | 0 refills | Status: DC
Start: 2023-10-20 — End: 2023-11-04

## 2023-10-20 NOTE — Patient Instructions (Signed)

## 2023-10-20 NOTE — Progress Notes (Signed)
Subjective:    Patient ID: Kathryn Merritt, female    DOB: 17-Aug-1961, 63 y.o.   MRN: [id number]  URI  This is a new problem. The current episode started 1 to 4 weeks ago. The problem has been waxing and waning. There has been no fever. Associated symptoms include congestion, ear pain, headaches and rhinorrhea. Pertinent negatives include no coughing. Treatments tried: cloracidin. The treatment provided mild relief.      Review of Systems  Constitutional:  Negative for chills and fever.  HENT:  Positive for congestion, ear pain and rhinorrhea.   Respiratory:  Negative for cough.   Neurological:  Positive for headaches.       Objective:   Physical Exam Constitutional:      Appearance: Normal appearance. She is obese.  HENT:     Right Ear: Tympanic membrane normal.     Left Ear: Tympanic membrane normal.     Nose: Congestion and rhinorrhea present.     Mouth/Throat:     Pharynx: No oropharyngeal exudate or posterior oropharyngeal erythema.  Cardiovascular:     Rate and Rhythm: Normal rate and regular rhythm.     Heart sounds: Normal heart sounds.  Pulmonary:     Effort: Pulmonary effort is normal.     Breath sounds: Normal breath sounds.  Skin:    General: Skin is warm.  Neurological:     General: No focal deficit present.     Mental Status: She is alert and oriented to person, place, and time.  Psychiatric:        Mood and Affect: Mood normal.        Behavior: Behavior normal.    BP 135/78   Pulse 63   Temp 98.5 F (36.9 C) (Temporal)   Ht 5\' 3"  (1.6 m)   Wt 257 lb (116.6 kg)   SpO2 100%   BMI 45.53 kg/m         Assessment & Plan:   RAJA CAPUTI in today with chief complaint of Nasal Congestion and Cough   1. Acute recurrent maxillary sinusitis (Primary) 1. Take meds as prescribed 2. Use a cool mist humidifier especially during the winter months and when heat has been humid. 3. Use saline nose sprays frequently 4. Saline irrigations of the nose can  be very helpful if done frequently.  * 4X daily for 1 week*  * Use of a nettie pot can be helpful with this. Follow directions with this* 5. Drink plenty of fluids 6. Keep thermostat turn down low 7.For any cough or congestion- cloracidin 8. For fever or aces or pains- take tylenol or ibuprofen appropriate for age and weight.  * for fevers greater than 101 orally you may alternate ibuprofen and tylenol every  3 hours.   Meds ordered this encounter  Medications   amoxicillin-clavulanate (AUGMENTIN) 875-125 MG tablet    Sig: Take 1 tablet by mouth 2 (two) times daily.    Dispense:  14 tablet    Refill:  0    Supervising Provider:   Arville Care A [[id number]]        The above assessment and management plan was discussed with the patient. The patient verbalized understanding of and has agreed to the management plan. Patient is aware to call the clinic if symptoms persist or worsen. Patient is aware when to return to the clinic for a follow-up visit. Patient educated on when it is appropriate to go to the emergency department.   Mary-Margaret Daphine Deutscher, FNP

## 2023-10-24 ENCOUNTER — Encounter (HOSPITAL_COMMUNITY): Payer: Self-pay | Admitting: Emergency Medicine

## 2023-10-24 ENCOUNTER — Emergency Department (HOSPITAL_COMMUNITY)
Admission: EM | Admit: 2023-10-24 | Discharge: 2023-10-24 | Disposition: A | Discharge status: Home / Self Care | Payer: Commercial Managed Care - HMO | Attending: Emergency Medicine | Admitting: Emergency Medicine

## 2023-10-24 ENCOUNTER — Other Ambulatory Visit (HOSPITAL_BASED_OUTPATIENT_CLINIC_OR_DEPARTMENT_OTHER): Payer: Self-pay

## 2023-10-24 ENCOUNTER — Other Ambulatory Visit: Payer: Self-pay

## 2023-10-24 ENCOUNTER — Emergency Department (HOSPITAL_COMMUNITY): Payer: Commercial Managed Care - HMO

## 2023-10-24 DIAGNOSIS — E876 Hypokalemia: Secondary | ICD-10-CM | POA: Diagnosis not present

## 2023-10-24 DIAGNOSIS — Z7901 Long term (current) use of anticoagulants: Secondary | ICD-10-CM | POA: Diagnosis not present

## 2023-10-24 DIAGNOSIS — Z79899 Other long term (current) drug therapy: Secondary | ICD-10-CM | POA: Diagnosis not present

## 2023-10-24 DIAGNOSIS — N289 Disorder of kidney and ureter, unspecified: Secondary | ICD-10-CM | POA: Diagnosis not present

## 2023-10-24 DIAGNOSIS — Z20822 Contact with and (suspected) exposure to covid-19: Secondary | ICD-10-CM | POA: Diagnosis not present

## 2023-10-24 DIAGNOSIS — I10 Essential (primary) hypertension: Secondary | ICD-10-CM | POA: Insufficient documentation

## 2023-10-24 DIAGNOSIS — R0789 Other chest pain: Secondary | ICD-10-CM | POA: Diagnosis present

## 2023-10-24 DIAGNOSIS — U071 COVID-19: Secondary | ICD-10-CM | POA: Diagnosis not present

## 2023-10-24 DIAGNOSIS — E119 Type 2 diabetes mellitus without complications: Secondary | ICD-10-CM | POA: Insufficient documentation

## 2023-10-24 DIAGNOSIS — I4891 Unspecified atrial fibrillation: Secondary | ICD-10-CM | POA: Insufficient documentation

## 2023-10-24 LAB — TROPONIN I (HIGH SENSITIVITY): Troponin I (High Sensitivity): 9 ng/L (ref <18)

## 2023-10-24 LAB — CBC WITH DIFFERENTIAL/PLATELET
Abs Immature Granulocytes: 0.01 10*3/uL (ref 0.00–0.07)
Basophils Absolute: 0 10*3/uL (ref 0.0–0.1)
Basophils Relative: 1 %
Eosinophils Absolute: 0.1 10*3/uL (ref 0.0–0.5)
Eosinophils Relative: 1 %
HCT: 43.3 % (ref 36.0–46.0)
Hemoglobin: 14.2 g/dL (ref 12.0–15.0)
Immature Granulocytes: 0 %
Lymphocytes Relative: 41 %
Lymphs Abs: 2.2 10*3/uL (ref 0.7–4.0)
MCH: 30 pg (ref 26.0–34.0)
MCHC: 32.8 g/dL (ref 30.0–36.0)
MCV: 91.4 fL (ref 80.0–100.0)
Monocytes Absolute: 0.4 10*3/uL (ref 0.1–1.0)
Monocytes Relative: 7 %
Neutro Abs: 2.7 10*3/uL (ref 1.7–7.7)
Neutrophils Relative %: 50 %
Platelets: 205 10*3/uL (ref 150–400)
RBC: 4.74 MIL/uL (ref 3.87–5.11)
RDW: 14.2 % (ref 11.5–15.5)
WBC: 5.4 10*3/uL (ref 4.0–10.5)
nRBC: 0 % (ref 0.0–0.2)

## 2023-10-24 LAB — URINALYSIS, W/ REFLEX TO CULTURE (INFECTION SUSPECTED)
Bacteria, UA: NONE SEEN
Bilirubin Urine: NEGATIVE
Glucose, UA: 50 mg/dL — AB
Hgb urine dipstick: NEGATIVE
Ketones, ur: NEGATIVE mg/dL
Leukocytes,Ua: NEGATIVE
Nitrite: NEGATIVE
Protein, ur: 30 mg/dL — AB
Specific Gravity, Urine: 1.012 (ref 1.005–1.030)
pH: 5 (ref 5.0–8.0)

## 2023-10-24 LAB — RESP PANEL BY RT-PCR (RSV, FLU A&B, COVID)  RVPGX2
Influenza A by PCR: NEGATIVE
Influenza B by PCR: NEGATIVE
Resp Syncytial Virus by PCR: NEGATIVE
SARS Coronavirus 2 by RT PCR: POSITIVE — AB

## 2023-10-24 LAB — COMPREHENSIVE METABOLIC PANEL
ALT: 22 U/L (ref 0–44)
AST: 21 U/L (ref 15–41)
Albumin: 3.7 g/dL (ref 3.5–5.0)
Alkaline Phosphatase: 66 U/L (ref 38–126)
Anion gap: 10 (ref 5–15)
BUN: 29 mg/dL — ABNORMAL HIGH (ref 8–23)
CO2: 24 mmol/L (ref 22–32)
Calcium: 9.8 mg/dL (ref 8.9–10.3)
Chloride: 101 mmol/L (ref 98–111)
Creatinine, Ser: 1.15 mg/dL — ABNORMAL HIGH (ref 0.44–1.00)
GFR, Estimated: 54 mL/min — ABNORMAL LOW (ref >60)
Glucose, Bld: 247 mg/dL — ABNORMAL HIGH (ref 70–99)
Potassium: 3.4 mmol/L — ABNORMAL LOW (ref 3.5–5.1)
Sodium: 135 mmol/L (ref 135–145)
Total Bilirubin: 0.6 mg/dL (ref 0.0–1.2)
Total Protein: 7.1 g/dL (ref 6.5–8.1)

## 2023-10-24 MED ORDER — POTASSIUM CHLORIDE CRYS ER 20 MEQ PO TBCR: 20.0000 meq | EXTENDED_RELEASE_TABLET | Freq: Two times a day (BID) | ORAL | 0 refills | Status: DC

## 2023-10-24 MED ORDER — POTASSIUM CHLORIDE CRYS ER 20 MEQ PO TBCR
40.0000 meq | EXTENDED_RELEASE_TABLET | Freq: Once | ORAL | Status: AC
  Administered 2023-10-24: 40 meq via ORAL
  Filled 2023-10-24: qty 2

## 2023-10-24 NOTE — ED Provider Notes (Signed)
Pt signed out by Dr. Preston Fleeting pending urine.    UA with some glucose and protein.     Pt is feeling better.  She is not hypoxic or in any distress.  She is stable for d/c.  Return if worse.  F/u with pcp.  Kathryn Merritt was evaluated in Emergency Department on 10/24/2023 for the symptoms described in the history of present illness. She was evaluated in the context of the global COVID-19 pandemic, which necessitated consideration that the patient might be at risk for infection with the SARS-CoV-2 virus that causes COVID-19. Institutional protocols and algorithms that pertain to the evaluation of patients at risk for COVID-19 are in a state of rapid change based on information released by regulatory bodies including the CDC and federal and state organizations. These policies and algorithms were followed during the patient's care in the ED.    Jacalyn Lefevre, MD 10/24/23 [id number]

## 2023-10-24 NOTE — ED Provider Notes (Signed)
[place] [facility]ENC[facility] DEPARTMENT AT [facility] Provider Note   CSN: [id number] Arrival date & time: 10/24/23  4782     History  Chief Complaint  Patient presents with   Tachycardia    Kathryn Merritt is a 63 y.o. female.  The history is provided by the patient.  She has history of hypertension, diabetes, hyperlipidemia, paroxysmal atrial fibrillation anticoagulated on apixaban and comes in stating that she just has not felt well for the last month.  She denies fever chills or sweats and denies cough.  She has had some mild, intermittent chest pain.  There has been occasional mild nausea but no vomiting.  She denies any diarrhea.  She did see her primary care provider last week who put her on antibiotics for suspected sinusitis, but she is not feeling any better.  She states that her heart rate is usually in the upper 40s to low 50s, and it has been abnormally high for the last several days occasionally getting as high as 80.  However, she does not feel like she is in atrial fibrillation.   Home Medications Prior to Admission medications   Medication Sig Start Date End Date Taking? Authorizing Provider  acetaminophen (TYLENOL) 500 MG tablet Take 1,000 mg by mouth every 8 (eight) hours as needed for moderate pain.    [provider]  albuterol (VENTOLIN HFA) 108 (90 Base) MCG/ACT inhaler Inhale 2 puffs into the lungs every 6 (six) hours as needed for wheezing or shortness of breath. 12/27/22   Daphine Deutscher, Mary-Margaret, FNP  amLODipine (NORVASC) 5 MG tablet Take 1 tablet (5 mg total) by mouth daily. 08/01/23   Daphine Deutscher Mary-Margaret, FNP  amoxicillin-clavulanate (AUGMENTIN) 875-125 MG tablet Take 1 tablet by mouth 2 (two) times daily. 10/20/23   Daphine Deutscher Mary-Margaret, FNP  apixaban (ELIQUIS) 5 MG TABS tablet Take 1 tablet (5 mg total) by mouth 2 (two) times daily. 09/19/23   Rollene Rotunda, MD  blood glucose meter kit and supplies Dispense based on patient and insurance  preference. Use up to four times daily as directed. (FOR ICD-10 E10.9, E11.9). 10/01/21   Dani Gobble, NP  carbamide peroxide (DEBROX) 6.5 % OTIC solution Place 5 drops into the left ear 2 (two) times daily. 09/30/23   Particia Nearing, PA-C  cetirizine (ZYRTEC ALLERGY) 10 MG tablet Take 1 tablet (10 mg total) by mouth daily. 07/20/23   Leath-Warren, Sadie Haber, NP  Cholecalciferol (VITAMIN D3) 50 MCG (2000 UT) capsule Take 1 capsule (2,000 Units total) by mouth daily. 09/27/22   Daryll Drown, NP  cyclobenzaprine (FLEXERIL) 5 MG tablet Take 1 tablet (5 mg total) by mouth 3 (three) times daily as needed for muscle spasms. 09/15/23   Sonny Masters, FNP  DM-APAP-CPM (CORICIDIN HBP) 10-325-2 MG TABS Take 1 tablet by mouth 3 (three) times daily as needed. 09/30/23   Particia Nearing, PA-C  fluticasone [facility]) 50 MCG/ACT nasal spray Place 2 sprays into both nostrils daily. 07/20/23   St Vena Austria, NP  glipiZIDE (GLUCOTROL) 5 MG tablet Take 1 tablet (5 mg total) by mouth 2 (two) times daily before a meal. 09/19/23   Daphine Deutscher, Mary-Margaret, FNP  lisinopril (ZESTRIL) 20 MG tablet Take 1 tablet (20 mg total) by mouth daily. 08/01/23   Daphine Deutscher, Mary-Margaret, FNP  lubiprostone (AMITIZA) 24 MCG capsule Take 1 capsule (24 mcg total) by mouth 2 (two) times daily. 08/01/23   Daphine Deutscher, Mary-Margaret, FNP  pantoprazole (PROTONIX) 40 MG tablet Take 1 tablet (40  mg total) by mouth daily. 09/05/23   Daphine Deutscher, Mary-Margaret, FNP  rosuvastatin (CRESTOR) 10 MG tablet Take 1 tablet (10 mg total) by mouth daily. 08/01/23   Daphine Deutscher, Mary-Margaret, FNP  Semaglutide, 1 MG/DOSE, 4 MG/3ML SOPN Inject 1 mg as directed once a week. 08/01/23   Daphine Deutscher, Mary-Margaret, FNP  torsemide (DEMADEX) 100 MG tablet Take 1 tablet (100 mg total) by mouth daily. 08/01/23   Daphine Deutscher Mary-Margaret, FNP      Allergies    Metformin and related    Review of Systems   Review of Systems  All other systems reviewed and are  negative.   Physical Exam Updated Vital Signs BP 119/76   Pulse 62   Temp 98.3 F (36.8 C) (Oral)   Resp 13   Ht 5\' 3"  (1.6 m)   Wt 117 kg   SpO2 96%   BMI 45.69 kg/m  Physical Exam Vitals and nursing note reviewed.   63 year old female, resting comfortably and in no acute distress. Vital signs are normal. Oxygen saturation is 96%, which is normal. Head is normocephalic and atraumatic. PERRLA, EOMI. Oropharynx is clear. Neck is nontender and supple without adenopathy. Lungs are clear without rales, wheezes, or rhonchi. Chest is nontender. Heart has regular rate and rhythm without murmur. Abdomen is soft, flat, nontender. Extremities have no cyanosis or edema, full range of motion is present. Skin is warm and dry without rash. Neurologic: Mental status is normal, cranial nerves are intact, moves all extremities equally.  ED Results / Procedures / Treatments   Labs (all labs ordered are listed, but only abnormal results are displayed) Labs Reviewed  RESP PANEL BY RT-PCR (RSV, FLU A&B, COVID)  RVPGX2 - Abnormal; Notable for the following components:      Result Value   SARS Coronavirus 2 by RT PCR POSITIVE (*)    All other components within normal limits  COMPREHENSIVE METABOLIC PANEL - Abnormal; Notable for the following components:   Potassium 3.4 (*)    Glucose, Bld 247 (*)    BUN 29 (*)    Creatinine, Ser 1.15 (*)    GFR, Estimated 54 (*)    All other components within normal limits  CBC WITH DIFFERENTIAL/PLATELET  URINALYSIS, W/ REFLEX TO CULTURE (INFECTION SUSPECTED)  TROPONIN I (HIGH SENSITIVITY)    EKG EKG Interpretation Date/Time:  Monday October 24 2023 06:17:58 EST Ventricular Rate:  73 PR Interval:  145 QRS Duration:  147 QT Interval:  426 QTC Calculation: 470 R Axis:   -54  Text Interpretation: Sinus rhythm RBBB and LAFB When compared with ECG of 03/29/2023, No significant change was found Confirmed by Dione Booze ([id number]) on 10/24/2023 6:26:19  AM  Radiology DG Chest Port 1 View Result Date: 10/24/2023 CLINICAL DATA:  Palpitation and tachycardia EXAM: PORTABLE CHEST 1 VIEW COMPARISON:  12/21/2022 FINDINGS: Artifact from EKG leads. Low volume chest with interstitial coarsening that is stable. There is no edema, consolidation, effusion, or pneumothorax. Normal heart size and mediastinal contours. IMPRESSION: Stable from prior.  No acute finding. Electronically Signed   By: Tiburcio Pea M.D.   On: 10/24/2023 06:43    Procedures Procedures    Medications Ordered in ED Medications - No data to display  ED Course/ Medical Decision Making/ A&P                                 Medical Decision Making Amount and/or Complexity of Data Reviewed  Labs: ordered. Radiology: ordered.  Risk Prescription drug management.   Malaise, suspect occult infection such as UTI or pneumonia or viral illness such as influenza or COVID-19.  I have reviewed her past records, and she was seen on 10/20/2023 and diagnosed with recurrent sinusitis treated with amoxicillin-clavulanate.  I have ordered an electrocardiogram, chest x-ray, laboratory workup of CBC and comprehensive metabolic panel as well as respiratory pathogen panel.  Chest x-ray shows no evidence of pneumonia.  I have independently viewed the images, agree with radiologist interpretation.  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  I reviewed her laboratory tests and my interpretation is mild hypokalemia, stable renal insufficiency, positive PCR for COVID-19 which is probably what is causing her symptoms  Urinalysis is pending.  I have ordered a dose of oral potassium and we will give her a prescription for potassium to take as an outpatient.  Symptoms have been present for too long to make her a candidate for antiviral treatment.  I have explained this to the patient.  Case  is signed out to Dr. Particia Nearing to evaluate urinalysis for possible UTI.  Final Clinical Impression(s) / ED Diagnoses Final diagnoses:  COVID-19 virus infection  Hypokalemia  Renal insufficiency    Rx / DC Orders ED Discharge Orders     None         Dione Booze, MD 10/24/23 [phone]

## 2023-10-24 NOTE — ED Triage Notes (Signed)
Pt here for elevated HR. Pt states she normally runs in the 40's and that today her HR is running in the 70's. States she has "felt well" since January.

## 2023-10-24 NOTE — ED Notes (Signed)
 Portable xray at bedside.

## 2023-10-31 ENCOUNTER — Encounter: Payer: Self-pay | Admitting: Cardiology

## 2023-10-31 ENCOUNTER — Other Ambulatory Visit: Payer: Self-pay | Admitting: Nurse Practitioner

## 2023-10-31 DIAGNOSIS — J40 Bronchitis, not specified as acute or chronic: Secondary | ICD-10-CM

## 2023-11-01 ENCOUNTER — Ambulatory Visit: Payer: Managed Care, Other (non HMO) | Admitting: Nurse Practitioner

## 2023-11-04 ENCOUNTER — Encounter: Payer: Self-pay | Admitting: Nurse Practitioner

## 2023-11-04 ENCOUNTER — Ambulatory Visit (INDEPENDENT_AMBULATORY_CARE_PROVIDER_SITE_OTHER): Payer: Commercial Managed Care - HMO | Admitting: Nurse Practitioner

## 2023-11-04 VITALS — BP 132/82 | HR 64 | Temp 96.9°F | Ht 63.0 in | Wt 260.6 lb

## 2023-11-04 DIAGNOSIS — E785 Hyperlipidemia, unspecified: Secondary | ICD-10-CM

## 2023-11-04 DIAGNOSIS — E559 Vitamin D deficiency, unspecified: Secondary | ICD-10-CM

## 2023-11-04 DIAGNOSIS — I480 Paroxysmal atrial fibrillation: Secondary | ICD-10-CM

## 2023-11-04 DIAGNOSIS — E1169 Type 2 diabetes mellitus with other specified complication: Secondary | ICD-10-CM

## 2023-11-04 DIAGNOSIS — G4733 Obstructive sleep apnea (adult) (pediatric): Secondary | ICD-10-CM

## 2023-11-04 DIAGNOSIS — E038 Other specified hypothyroidism: Secondary | ICD-10-CM

## 2023-11-04 DIAGNOSIS — K219 Gastro-esophageal reflux disease without esophagitis: Secondary | ICD-10-CM

## 2023-11-04 DIAGNOSIS — M5431 Sciatica, right side: Secondary | ICD-10-CM

## 2023-11-04 DIAGNOSIS — E119 Type 2 diabetes mellitus without complications: Secondary | ICD-10-CM

## 2023-11-04 DIAGNOSIS — R1319 Other dysphagia: Secondary | ICD-10-CM

## 2023-11-04 DIAGNOSIS — I10 Essential (primary) hypertension: Secondary | ICD-10-CM | POA: Diagnosis not present

## 2023-11-04 DIAGNOSIS — Z7985 Long-term (current) use of injectable non-insulin antidiabetic drugs: Secondary | ICD-10-CM

## 2023-11-04 DIAGNOSIS — N1831 Chronic kidney disease, stage 3a: Secondary | ICD-10-CM

## 2023-11-04 MED ORDER — GLIPIZIDE 5 MG PO TABS: 5.0000 mg | ORAL_TABLET | Freq: Two times a day (BID) | ORAL | 5 refills | Status: DC

## 2023-11-04 MED ORDER — PANTOPRAZOLE SODIUM 40 MG PO TBEC: 40.0000 mg | DELAYED_RELEASE_TABLET | Freq: Every day | ORAL | 1 refills | Status: DC

## 2023-11-04 MED ORDER — METHYLPREDNISOLONE ACETATE 80 MG/ML IJ SUSP
80.0000 mg | Freq: Once | INTRAMUSCULAR | Status: AC
  Administered 2023-11-04: 80 mg via INTRAMUSCULAR

## 2023-11-04 MED ORDER — DAPAGLIFLOZIN PROPANEDIOL 10 MG PO TABS: 10.0000 mg | ORAL_TABLET | Freq: Every day | ORAL | 5 refills | Status: DC

## 2023-11-04 NOTE — Telephone Encounter (Signed)
Copied from CRM [id number]. Topic: Clinical - Prescription Issue >> Nov 04, 2023  4:28 PM Gildardo Pounds wrote: Reason for CRM: April with [facility] states the dapagliflozin propanediol (FARXIGA) 10 MG TABS tablet rejects for being non-formulary. callback number is [phone]

## 2023-11-04 NOTE — Progress Notes (Signed)
Subjective:    Patient ID: Kathryn Merritt, female    DOB: [birth year], 63 y.o.   MRN: [id number]   Chief Complaint: medical management of chronic issues     HPI:  Kathryn Merritt is a 63 y.o. who identifies as a female who was assigned female at birth.   Social history: Lives with: by herself Work history: retired   Water engineer in today for follow up of the following chronic medical issues:  1. Primary hypertension No c/o chest pain, sob or headache. Does not check blood pressure at home. BP Readings from Last 3 Encounters:  10/24/23 131/71  10/20/23 135/78  09/30/23 (!) 151/89      2. Paroxysmal atrial fibrillation (HCC) No c/o palpitations or heart racing  3. Hyperlipidemia associated with type 2 diabetes mellitus (HCC) Does not watch diet and does no dedicated exercise Lab Results  Component Value Date   CHOL 139 08/01/2023   HDL 52 08/01/2023   LDLCALC 67 08/01/2023   TRIG 108 08/01/2023   CHOLHDL 2.7 08/01/2023     4. Controlled type 2 diabetes mellitus without complication, without long-term current use of insulin (HCC) 5. Diabetes mellitus treated with injections of non-insulin medication (HCC) Fasting blood sugars re running around 200. Patient was started on ozempic at last visit. Was not able to get. Lab Results  Component Value Date   HGBA1C 9.3 (H) 08/01/2023     6. Gastroesophageal reflux disease, unspecified whether esophagitis present Is on omeprazole daily and is doing well.  7. Esophageal dysphagia No recent issues  8. OSA (obstructive sleep apnea) She does not wear her cpap  9. Subclinical hypothyroidism No issues that she is aware of. Lab Results  Component Value Date   TSH 3.690 10/23/2021     10. Vitamin D deficiency Is on daily vitamin d supplement Last vitamin D Lab Results  Component Value Date   VD25OH 18.6 (L) 09/24/2022     11. Stage 3a chronic kidney disease (HCC) No voiding issues and no edema Lab Results   Component Value Date   CREATININE 1.15 (H) 10/24/2023     12. Morbid obesity (HCC) No recent weight changes  Wt Readings from Last 3 Encounters:  10/24/23 257 lb 15 oz (117 kg)  10/20/23 257 lb (116.6 kg)  09/22/23 253 lb (114.8 kg)   BMI Readings from Last 3 Encounters:  11/04/23 45.69 kg/m  10/24/23 45.69 kg/m  10/20/23 45.53 kg/m       New complaints: Right sciatica flare up- rates 5/10. Pain radiates down right leg. Steroids shots usually help with flare ups.  Allergies  Allergen Reactions   Metformin And Related     Made patient feel bad/GI   Outpatient Encounter Medications as of 11/04/2023  Medication Sig   acetaminophen (TYLENOL) 500 MG tablet Take 1,000 mg by mouth every 8 (eight) hours as needed for moderate pain.   albuterol (VENTOLIN HFA) 108 (90 Base) MCG/ACT inhaler Inhale 2 puffs into the lungs every 6 (six) hours as needed for wheezing or shortness of breath.   amLODipine (NORVASC) 5 MG tablet Take 1 tablet (5 mg total) by mouth daily.   amoxicillin-clavulanate (AUGMENTIN) 875-125 MG tablet Take 1 tablet by mouth 2 (two) times daily.   apixaban (ELIQUIS) 5 MG TABS tablet Take 1 tablet (5 mg total) by mouth 2 (two) times daily.   blood glucose meter kit and supplies Dispense based on patient and insurance preference. Use up to four times daily as directed. (FOR  ICD-10 E10.9, E11.9).   carbamide peroxide (DEBROX) 6.5 % OTIC solution Place 5 drops into the left ear 2 (two) times daily.   cetirizine (ZYRTEC ALLERGY) 10 MG tablet Take 1 tablet (10 mg total) by mouth daily.   Cholecalciferol (VITAMIN D3) 50 MCG (2000 UT) capsule Take 1 capsule (2,000 Units total) by mouth daily.   cyclobenzaprine (FLEXERIL) 5 MG tablet Take 1 tablet (5 mg total) by mouth 3 (three) times daily as needed for muscle spasms.   DM-APAP-CPM (CORICIDIN HBP) 10-325-2 MG TABS Take 1 tablet by mouth 3 (three) times daily as needed.   fluticasone (FLONASE) 50 MCG/ACT nasal spray Place 2  sprays into both nostrils daily.   glipiZIDE (GLUCOTROL) 5 MG tablet Take 1 tablet (5 mg total) by mouth 2 (two) times daily before a meal.   lisinopril (ZESTRIL) 20 MG tablet Take 1 tablet (20 mg total) by mouth daily.   lubiprostone (AMITIZA) 24 MCG capsule Take 1 capsule (24 mcg total) by mouth 2 (two) times daily.   pantoprazole (PROTONIX) 40 MG tablet Take 1 tablet (40 mg total) by mouth daily.   potassium chloride SA (KLOR-CON M) 20 MEQ tablet Take 1 tablet (20 mEq total) by mouth 2 (two) times daily.   rosuvastatin (CRESTOR) 10 MG tablet Take 1 tablet (10 mg total) by mouth daily.   Semaglutide, 1 MG/DOSE, 4 MG/3ML SOPN Inject 1 mg as directed once a week.   torsemide (DEMADEX) 100 MG tablet Take 1 tablet (100 mg total) by mouth daily.   No facility-administered encounter medications on file as of 11/04/2023.    Past Surgical History:  Procedure Laterality Date   BALLOON DILATION N/A 02/22/2022   Procedure: BALLOON DILATION;  Surgeon: Lanelle Bal, DO;  Location: [facility];  Service: Endoscopy;  Laterality: N/A;   BIOPSY  02/22/2022   Procedure: BIOPSY;  Surgeon: Lanelle Bal, DO;  Location: [facility];  Service: Endoscopy;;   CARDIOVERSION N/A 03/18/2021   Procedure: CARDIOVERSION;  Surgeon: Jonelle Sidle, MD;  Location: [facility];  Service: Cardiovascular;  Laterality: N/A;   CESAREAN SECTION     times 2   COLONOSCOPY WITH PROPOFOL N/A 02/22/2022   Procedure: COLONOSCOPY WITH PROPOFOL;  Surgeon: Lanelle Bal, DO;  Location: [facility];  Service: Endoscopy;  Laterality: N/A;  7:30am   CORONARY ANGIOGRAPHY N/A 06/09/2022   Procedure: CORONARY ANGIOGRAPHY;  Surgeon: Orbie Pyo, MD;  Location: [facility];  Service: Cardiovascular;  Laterality: N/A;   ESOPHAGOGASTRODUODENOSCOPY (EGD) WITH PROPOFOL N/A 02/22/2022   Procedure: ESOPHAGOGASTRODUODENOSCOPY (EGD) WITH PROPOFOL;  Surgeon: Lanelle Bal, DO;  Location: [facility];  Service: Endoscopy;   Laterality: N/A;   INCISION AND DRAINAGE ABSCESS Right    axilla   LITHOTRIPSY     POLYPECTOMY  02/22/2022   Procedure: POLYPECTOMY;  Surgeon: Lanelle Bal, DO;  Location: [facility];  Service: Endoscopy;;    Family History  Problem Relation Age of Onset   Hypertension Mother    Cancer Father    Hypertension Father    Cancer Brother    Heart disease Paternal Grandmother    Breast cancer Neg Hx    Colon cancer Neg Hx       Controlled substance contract: n/a     Review of Systems  Constitutional:  Negative for diaphoresis.  Eyes:  Negative for pain.  Respiratory:  Negative for shortness of breath.   Cardiovascular:  Negative for chest pain, palpitations and leg  swelling.  Gastrointestinal:  Negative for abdominal pain.  Endocrine: Negative for polydipsia.  Skin:  Negative for rash.  Neurological:  Negative for dizziness, weakness and headaches.  Hematological:  Does not bruise/bleed easily.  All other systems reviewed and are negative.      Objective:   Physical Exam Vitals and nursing note reviewed.  Constitutional:      General: She is not in acute distress.    Appearance: Normal appearance. She is well-developed.  HENT:     Head: Normocephalic.     Right Ear: Tympanic membrane normal.     Left Ear: Tympanic membrane normal.     Nose: Nose normal.     Mouth/Throat:     Mouth: Mucous membranes are moist.  Eyes:     Pupils: Pupils are equal, round, and reactive to light.  Neck:     Vascular: No carotid bruit or JVD.  Cardiovascular:     Rate and Rhythm: Normal rate and regular rhythm.     Heart sounds: Normal heart sounds.  Pulmonary:     Effort: Pulmonary effort is normal. No respiratory distress.     Breath sounds: Normal breath sounds. No wheezing or rales.  Chest:     Chest wall: No tenderness.  Abdominal:     General: Bowel sounds are normal. There is no distension or abdominal bruit.     Palpations: Abdomen is soft. There is no  hepatomegaly, splenomegaly, mass or pulsatile mass.     Tenderness: There is no abdominal tenderness.  Musculoskeletal:        General: Normal range of motion.     Cervical back: Normal range of motion and neck supple.  Lymphadenopathy:     Cervical: No cervical adenopathy.  Skin:    General: Skin is warm and dry.  Neurological:     Mental Status: She is alert and oriented to person, place, and time.     Deep Tendon Reflexes: Reflexes are normal and symmetric.  Psychiatric:        Behavior: Behavior normal.        Thought Content: Thought content normal.        Judgment: Judgment normal.    BP 132/82   Pulse 64   Temp (!) 96.9 F (36.1 C)   Ht 5\' 3"  (1.6 m)   Wt 260 lb 9.6 oz (118.2 kg)   SpO2 99%   BMI 46.16 kg/m     Hgba1c 9.2%     Assessment & Plan:  Kathryn Merritt comes in today with chief complaint of No chief complaint on file.   Diagnosis and orders addressed:  1. Primary hypertension Low sodium diet - CBC with Differential/Platelet - CMP14+EGFR - amLODipine (NORVASC) 5 MG tablet; Take 1 tablet (5 mg total) by mouth daily.  Dispense: 90 tablet; Refill: 1 - lisinopril (ZESTRIL) 20 MG tablet; Take 1 tablet (20 mg total) by mouth daily.  Dispense: 90 tablet; Refill: 1 - torsemide (DEMADEX) 100 MG tablet; Take 1 tablet (100 mg total) by mouth daily.  Dispense: 90 tablet; Refill: 1  2. Paroxysmal atrial fibrillation (HCC) Avoid caffeine  3. Hyperlipidemia associated with type 2 diabetes mellitus (HCC) Low fat diet - Lipid panel - rosuvastatin (CRESTOR) 10 MG tablet; Take 1 tablet (10 mg total) by mouth daily.  Dispense: 90 tablet; Refill: 1  4. Controlled type 2 diabetes mellitus without complication, without long-term current use of insulin (HCC) Stricter carb counting Added metformin to meds - Bayer DCA Hb A1c Waived -  metFORMIN (GLUCOPHAGE) 500 MG tablet; Take 1 tablet (500 mg total) by mouth 2 (two) times daily with a meal.  Dispense: 180 tablet;  Refill: 3  5. Diabetes mellitus treated with injections of non-insulin medication (HCC) Increased to 1mg  weekly - Semaglutide, 1 MG/DOSE, 4 MG/3ML SOPN; Inject 1 mg as directed once a week.  Dispense: 3 mL; Refill: 3  6. Gastroesophageal reflux disease, unspecified whether esophagitis present Avoid spicy foods Do not eat 2 hours prior to bedtime Stop omeprazole Start protonix daily - pantoprazole (PROTONIX) 40 MG tablet; Take 1 tablet (40 mg total) by mouth daily.  Dispense: 30 tablet; Refill: 3  7. Esophageal dysphagia Chew food well  8. OSA (obstructive sleep apnea) Needs to wear cpap  9. Subclinical hypothyroidism Labs pending  10. Vitamin D deficiency On daily vitamin d supplement  11. Stage 3a chronic kidney disease (HCC) Labs pending  12. Morbid obesity (HCC) Discussed diet and exercise for person with BMI >25 Will recheck weight in 3-6 months   13. Constipation due to outlet dysfunction May need to stop meds if metformin causes looser stools. - lubiprostone (AMITIZA) 24 MCG capsule; Take 1 capsule (24 mcg total) by mouth 2 (two) times daily.  Dispense: 180 capsule; Refill: 0   Labs pending Health Maintenance reviewed Diet and exercise encouraged  Follow up plan: 3 months   Mary-Margaret Daphine Deutscher, FNP

## 2023-11-04 NOTE — Patient Instructions (Signed)

## 2023-11-05 LAB — CMP14+EGFR
ALT: 14 [IU]/L (ref 0–32)
AST: 14 [IU]/L (ref 0–40)
Albumin: 3.9 g/dL (ref 3.9–4.9)
Alkaline Phosphatase: 74 [IU]/L (ref 44–121)
BUN/Creatinine Ratio: 18 (ref 12–28)
BUN: 20 mg/dL (ref 8–27)
Bilirubin Total: 0.2 mg/dL (ref 0.0–1.2)
CO2: 24 mmol/L (ref 20–29)
Calcium: 10 mg/dL (ref 8.7–10.3)
Chloride: 105 mmol/L (ref 96–106)
Creatinine, Ser: 1.1 mg/dL — ABNORMAL HIGH (ref 0.57–1.00)
Globulin, Total: 2 g/dL (ref 1.5–4.5)
Glucose: 276 mg/dL — ABNORMAL HIGH (ref 70–99)
Potassium: 3.7 mmol/L (ref 3.5–5.2)
Sodium: 142 mmol/L (ref 134–144)
Total Protein: 5.9 g/dL — ABNORMAL LOW (ref 6.0–8.5)
eGFR: 57 mL/min/{1.73_m2} — ABNORMAL LOW (ref >59)

## 2023-11-05 LAB — CBC WITH DIFFERENTIAL/PLATELET
Basophils Absolute: 0.1 10*3/uL (ref 0.0–0.2)
Basos: 1 %
EOS (ABSOLUTE): 0.1 10*3/uL (ref 0.0–0.4)
Eos: 2 %
Hematocrit: 39 % (ref 34.0–46.6)
Hemoglobin: 12.6 g/dL (ref 11.1–15.9)
Immature Grans (Abs): 0 10*3/uL (ref 0.0–0.1)
Immature Granulocytes: 0 %
Lymphocytes Absolute: 2.9 10*3/uL (ref 0.7–3.1)
Lymphs: 46 %
MCH: 29.9 pg (ref 26.6–33.0)
MCHC: 32.3 g/dL (ref 31.5–35.7)
MCV: 93 fL (ref 79–97)
Monocytes Absolute: 0.5 10*3/uL (ref 0.1–0.9)
Monocytes: 8 %
Neutrophils Absolute: 2.7 10*3/uL (ref 1.4–7.0)
Neutrophils: 43 %
Platelets: 231 10*3/uL (ref 150–450)
RBC: 4.21 x10E6/uL (ref 3.77–5.28)
RDW: 13.1 % (ref 11.7–15.4)
WBC: 6.3 10*3/uL (ref 3.4–10.8)

## 2023-11-05 LAB — LIPID PANEL
Chol/HDL Ratio: 2.6 {ratio} (ref 0.0–4.4)
Cholesterol, Total: 143 mg/dL (ref 100–199)
HDL: 54 mg/dL (ref >39)
LDL Chol Calc (NIH): 65 mg/dL (ref 0–99)
Triglycerides: 142 mg/dL (ref 0–149)
VLDL Cholesterol Cal: 24 mg/dL (ref 5–40)

## 2023-11-05 IMAGING — DX DG SHOULDER 2+V*L*
4 series · 4 of 4 positions shown · non-contrast
Comparison: None.

CLINICAL DATA: 2-3 weeks of LEFT shoulder pain and stiffness. No
known injury.

EXAM:
LEFT SHOULDER - 2+ VIEW

[shoulder internal rotation ap]
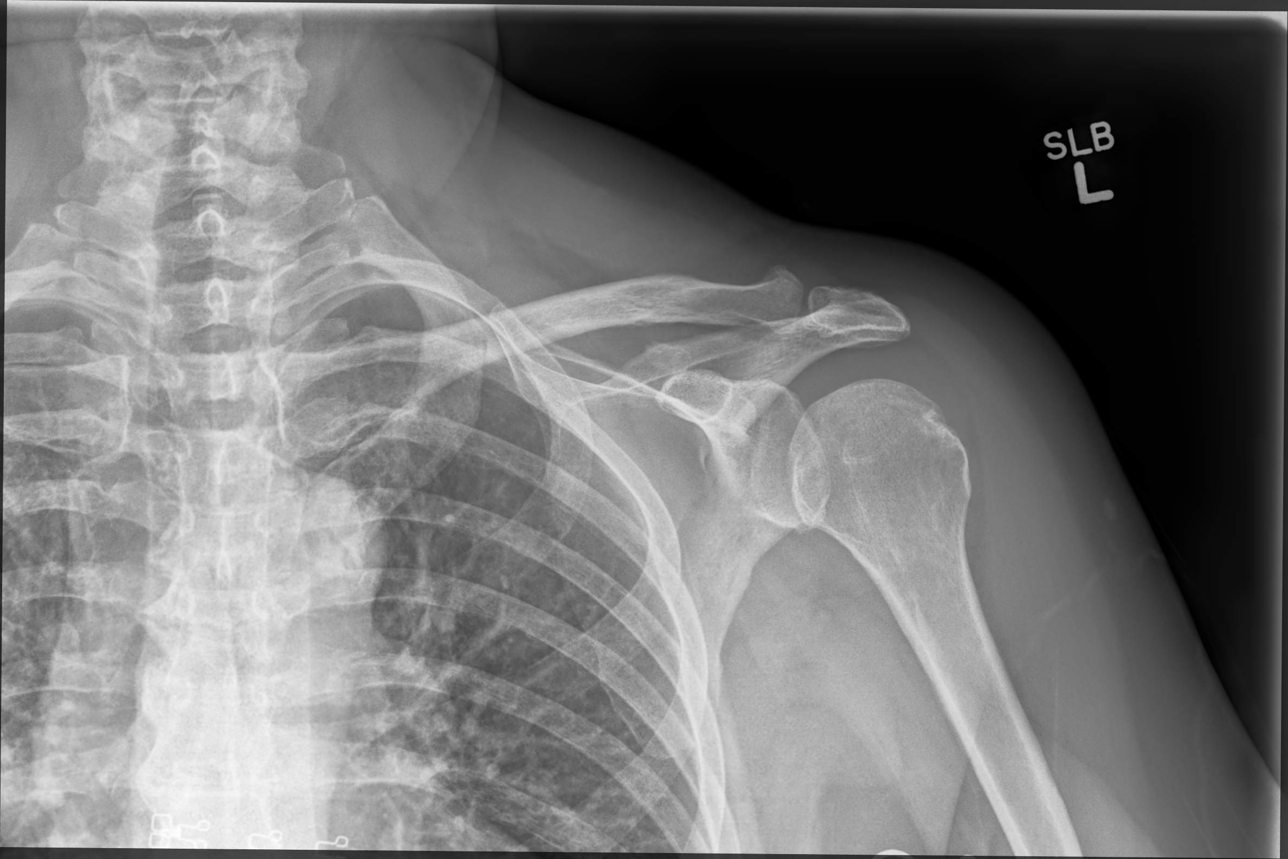

[shoulder (grashey view) ap]
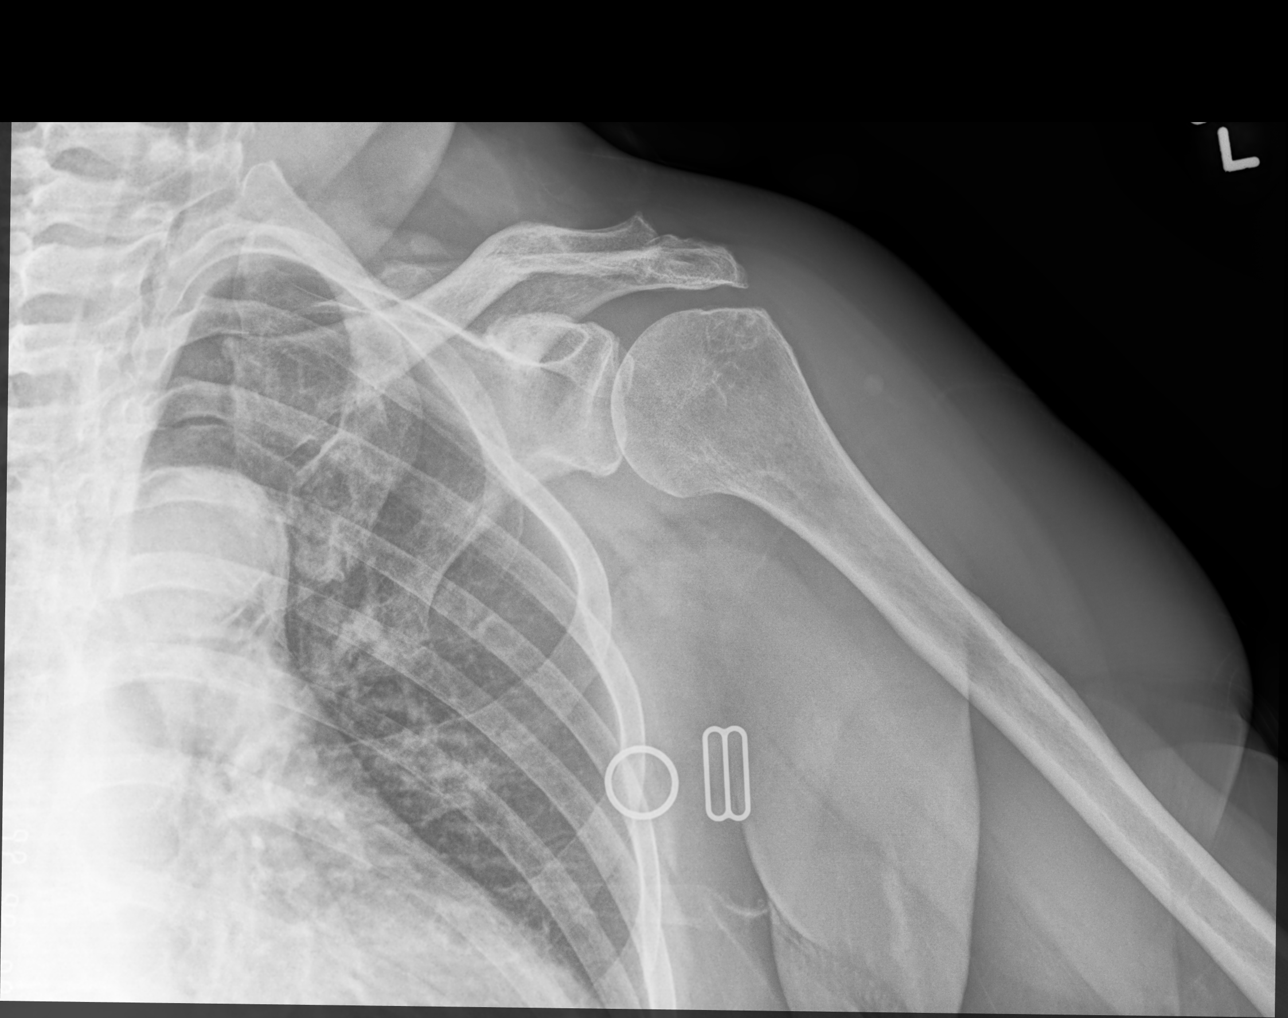

[shoulder (y view) lat (1 of 2)]
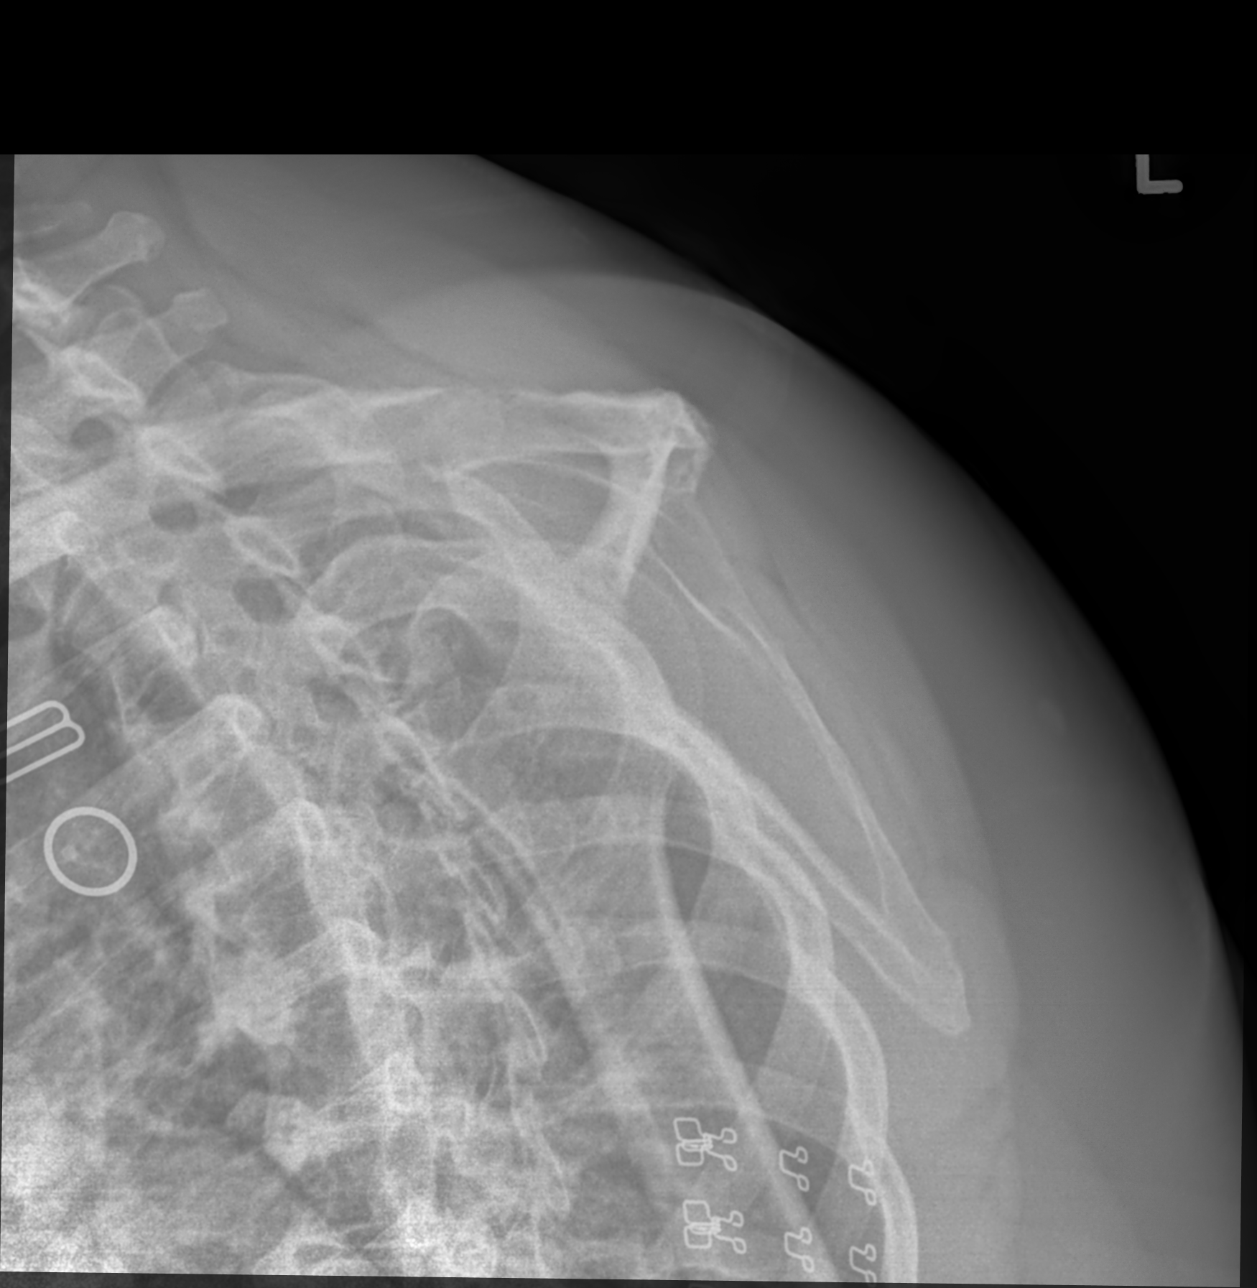

[shoulder (y view) lat (2 of 2)]
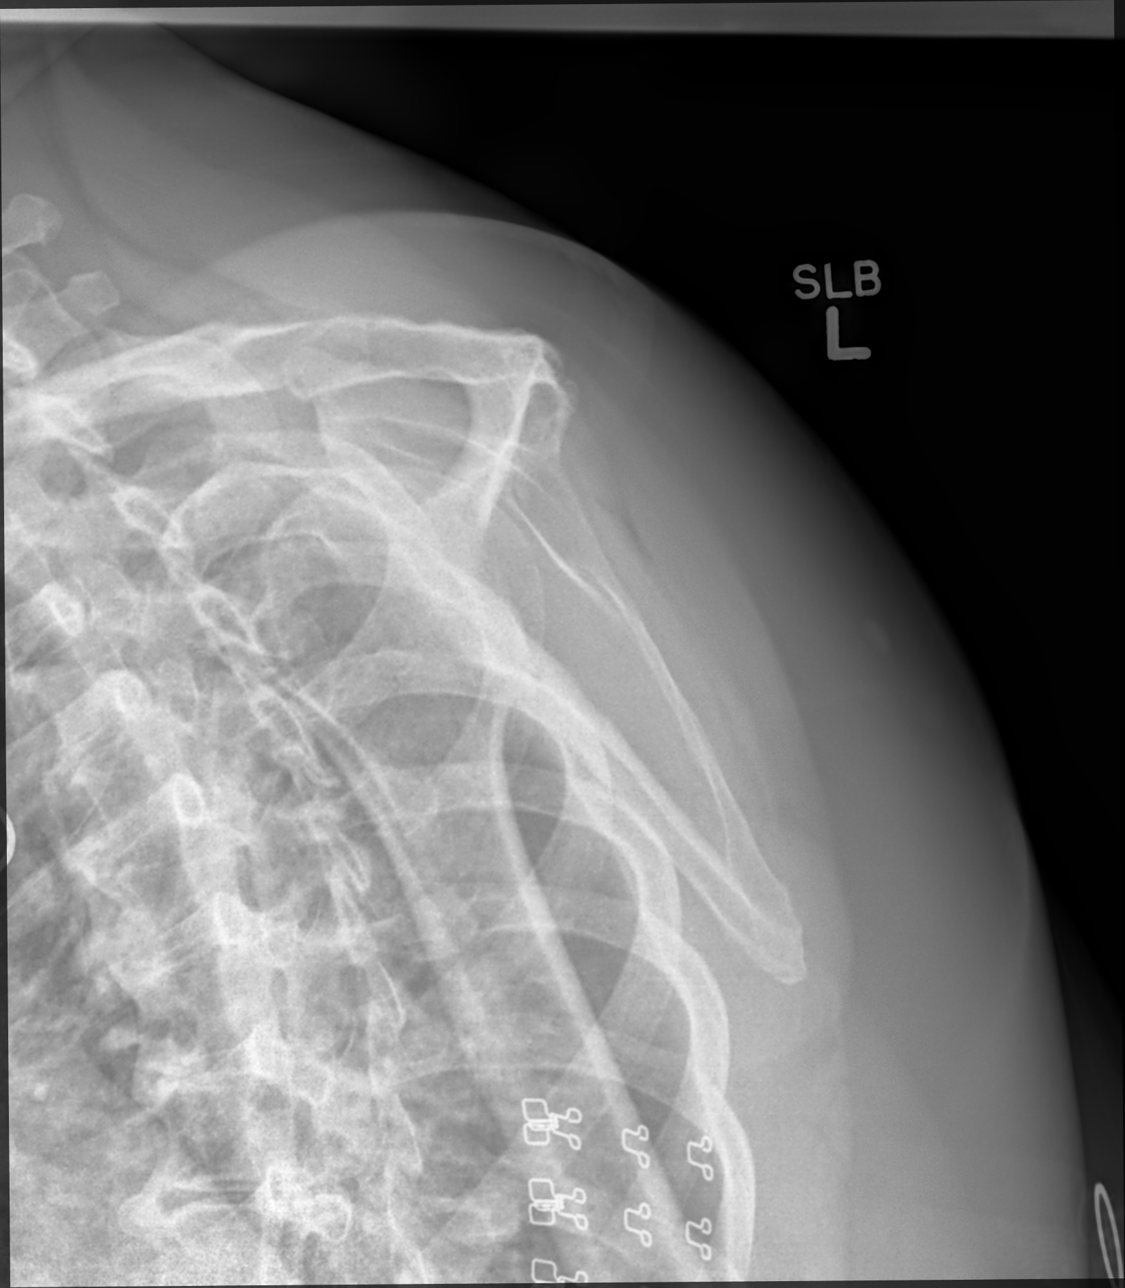

[4 of 4 positions shown; findings below may reference images not displayed]

FINDINGS: Osseous alignment is normal. No fracture line or displaced fracture
fragment is seen. No acute-appearing cortical irregularity or
osseous lesion. No significant degenerative change at the
glenohumeral or acromioclavicular joint spaces. Soft tissues about
the LEFT shoulder are unremarkable.
IMPRESSION: Normal plain film examination of the LEFT shoulder. No acute
findings.

## 2023-11-07 LAB — BAYER DCA HB A1C WAIVED: HB A1C (BAYER DCA - WAIVED): 9.2 % — ABNORMAL HIGH (ref 4.8–5.6)

## 2023-11-21 NOTE — Telephone Encounter (Signed)
 Please check on PA- glipizide by itself is not working

## 2023-11-23 ENCOUNTER — Other Ambulatory Visit (HOSPITAL_COMMUNITY): Payer: Self-pay

## 2023-11-23 ENCOUNTER — Other Ambulatory Visit: Payer: Self-pay

## 2023-11-23 ENCOUNTER — Emergency Department (HOSPITAL_COMMUNITY)
Admission: EM | Admit: 2023-11-23 | Discharge: 2023-11-23 | Disposition: A | Discharge status: Home / Self Care | Attending: Student | Admitting: Student

## 2023-11-23 ENCOUNTER — Encounter (HOSPITAL_COMMUNITY): Payer: Self-pay | Admitting: *Deleted

## 2023-11-23 ENCOUNTER — Emergency Department (HOSPITAL_COMMUNITY)

## 2023-11-23 DIAGNOSIS — E119 Type 2 diabetes mellitus without complications: Secondary | ICD-10-CM | POA: Diagnosis not present

## 2023-11-23 DIAGNOSIS — Z7901 Long term (current) use of anticoagulants: Secondary | ICD-10-CM | POA: Diagnosis not present

## 2023-11-23 DIAGNOSIS — Z8616 Personal history of COVID-19: Secondary | ICD-10-CM | POA: Diagnosis not present

## 2023-11-23 DIAGNOSIS — I4891 Unspecified atrial fibrillation: Secondary | ICD-10-CM | POA: Insufficient documentation

## 2023-11-23 DIAGNOSIS — Z79899 Other long term (current) drug therapy: Secondary | ICD-10-CM | POA: Diagnosis not present

## 2023-11-23 DIAGNOSIS — N183 Chronic kidney disease, stage 3 unspecified: Secondary | ICD-10-CM | POA: Diagnosis not present

## 2023-11-23 DIAGNOSIS — R002 Palpitations: Secondary | ICD-10-CM | POA: Diagnosis present

## 2023-11-23 DIAGNOSIS — I129 Hypertensive chronic kidney disease with stage 1 through stage 4 chronic kidney disease, or unspecified chronic kidney disease: Secondary | ICD-10-CM | POA: Insufficient documentation

## 2023-11-23 LAB — COMPREHENSIVE METABOLIC PANEL
ALT: 18 U/L (ref 0–44)
AST: 16 U/L (ref 15–41)
Albumin: 3.5 g/dL (ref 3.5–5.0)
Alkaline Phosphatase: 57 U/L (ref 38–126)
Anion gap: 7 (ref 5–15)
BUN: 30 mg/dL — ABNORMAL HIGH (ref 8–23)
CO2: 22 mmol/L (ref 22–32)
Calcium: 9.6 mg/dL (ref 8.9–10.3)
Chloride: 110 mmol/L (ref 98–111)
Creatinine, Ser: 1.08 mg/dL — ABNORMAL HIGH (ref 0.44–1.00)
GFR, Estimated: 58 mL/min — ABNORMAL LOW (ref >60)
Glucose, Bld: 241 mg/dL — ABNORMAL HIGH (ref 70–99)
Potassium: 3.8 mmol/L (ref 3.5–5.1)
Sodium: 139 mmol/L (ref 135–145)
Total Bilirubin: 0.6 mg/dL (ref 0.0–1.2)
Total Protein: 6.6 g/dL (ref 6.5–8.1)

## 2023-11-23 LAB — CBC WITH DIFFERENTIAL/PLATELET
Abs Immature Granulocytes: 0.03 10*3/uL (ref 0.00–0.07)
Basophils Absolute: 0.1 10*3/uL (ref 0.0–0.1)
Basophils Relative: 1 %
Eosinophils Absolute: 0.2 10*3/uL (ref 0.0–0.5)
Eosinophils Relative: 3 %
HCT: 44.2 % (ref 36.0–46.0)
Hemoglobin: 14.3 g/dL (ref 12.0–15.0)
Immature Granulocytes: 0 %
Lymphocytes Relative: 44 %
Lymphs Abs: 3.6 10*3/uL (ref 0.7–4.0)
MCH: 29.7 pg (ref 26.0–34.0)
MCHC: 32.4 g/dL (ref 30.0–36.0)
MCV: 91.7 fL (ref 80.0–100.0)
Monocytes Absolute: 0.7 10*3/uL (ref 0.1–1.0)
Monocytes Relative: 8 %
Neutro Abs: 3.5 10*3/uL (ref 1.7–7.7)
Neutrophils Relative %: 44 %
Platelets: 233 10*3/uL (ref 150–400)
RBC: 4.82 MIL/uL (ref 3.87–5.11)
RDW: 14 % (ref 11.5–15.5)
WBC: 8 10*3/uL (ref 4.0–10.5)
nRBC: 0 % (ref 0.0–0.2)

## 2023-11-23 MED ORDER — LACTATED RINGERS IV BOLUS
1000.0000 mL | Freq: Once | INTRAVENOUS | Status: AC
  Administered 2023-11-23: 1000 mL via INTRAVENOUS

## 2023-11-23 MED ORDER — PROPOFOL 10 MG/ML IV BOLUS
1.0000 mg/kg | Freq: Once | INTRAVENOUS | Status: AC
  Administered 2023-11-23: 50 mg via INTRAVENOUS
  Filled 2023-11-23: qty 20

## 2023-11-23 MED ORDER — DILTIAZEM HCL 25 MG/5ML IV SOLN
10.0000 mg | Freq: Once | INTRAVENOUS | Status: AC
  Administered 2023-11-23: 10 mg via INTRAVENOUS
  Filled 2023-11-23: qty 5

## 2023-11-23 MED ORDER — PHENYLEPHRINE 80 MCG/ML (10ML) SYRINGE FOR IV PUSH (FOR BLOOD PRESSURE SUPPORT)
80.0000 ug | PREFILLED_SYRINGE | Freq: Once | INTRAVENOUS | Status: AC
  Administered 2023-11-23: 80 ug via INTRAVENOUS
  Filled 2023-11-23: qty 10

## 2023-11-23 NOTE — ED Notes (Signed)
 ED Provider at bedside.

## 2023-11-23 NOTE — Sedation Documentation (Signed)
 Family updated as to patient's status.

## 2023-11-23 NOTE — ED Triage Notes (Signed)
 Pt states she started to feel like her heart rate was high around midnight in the 130's and has been trying to get it to come down but has not been successful

## 2023-11-23 NOTE — Sedation Documentation (Addendum)
EKG captured 

## 2023-11-23 NOTE — ED Provider Notes (Signed)
[facility] AT [facility] Provider Note  CSN: [id number] Arrival date & time: 11/23/23 0355  Chief Complaint(s) Palpitations  HPI Kathryn Merritt is a 63 y.o. female with PMH paroxysmal A-fib on Eliquis, HTN, HLD, obesity who presents emergency room for evaluation of palpitations.  States that she had COVID recently and has had persistently elevated heart rates but has remained in normal rhythm.  Denied around midnight she felt rapid tachycardia and found herself to have rates in the 130s.  Patient arrives with significant elevated irregular heart rates in the 130s-140s.  Currently denying chest pain, abdominal pain, nausea, vomiting, shortness of breath, headache, fever or other systemic symptoms.  Has been compliant with her Eliquis.   Past Medical History Past Medical History:  Diagnosis Date   Chronic back pain    Diabetes mellitus    Edema leg    Hyperlipidemia    Hypertension    Joint pain    Kidney stones    Other malaise and fatigue    PAF (paroxysmal atrial fibrillation) (HCC)    a. new diagnosis in 02/2021 --> s/p DCCV on 03/18/2021   RBBB    Sinus bradycardia    Sleep apnea    Patient Active Problem List   Diagnosis Date Noted   Diabetes mellitus treated with injections of non-insulin medication (HCC) 08/01/2023   Viral URI 07/20/2023   Hypercoagulable state due to paroxysmal atrial fibrillation (HCC) 03/29/2023   Lesion of left ear 06/24/2022   Vitamin D deficiency 01/21/2022   Stage 3a chronic kidney disease (HCC) 01/21/2022   Constipation due to outlet dysfunction 01/13/2022   Esophageal dysphagia 01/13/2022   Subclinical hypothyroidism 10/23/2021   Gastroesophageal reflux disease 07/16/2021   OSA (obstructive sleep apnea) 07/14/2021   Hypertension 06/18/2021   Hyperlipidemia associated with type 2 diabetes mellitus (HCC) 06/18/2021   Paroxysmal atrial fibrillation (HCC) 03/18/2021   Morbid obesity (HCC) 03/18/2021   Controlled  type 2 diabetes mellitus without complication, without long-term current use of insulin (HCC) 03/18/2021   Home Medication(s) Prior to Admission medications   Medication Sig Start Date End Date Taking? Authorizing Provider  acetaminophen (TYLENOL) 500 MG tablet Take 1,000 mg by mouth every 8 (eight) hours as needed for moderate pain.    [provider]  albuterol (VENTOLIN HFA) 108 (90 Base) MCG/ACT inhaler Inhale 2 puffs into the lungs every 6 (six) hours as needed for wheezing or shortness of breath. 10/31/23   Daphine Deutscher, Mary-Margaret, FNP  amLODipine (NORVASC) 5 MG tablet Take 1 tablet (5 mg total) by mouth daily. 08/01/23   Daphine Deutscher Mary-Margaret, FNP  apixaban (ELIQUIS) 5 MG TABS tablet Take 1 tablet (5 mg total) by mouth 2 (two) times daily. 09/19/23   Rollene Rotunda, MD  blood glucose meter kit and supplies Dispense based on patient and insurance preference. Use up to four times daily as directed. (FOR ICD-10 E10.9, E11.9). 10/01/21   Dani Gobble, NP  carbamide peroxide (DEBROX) 6.5 % OTIC solution Place 5 drops into the left ear 2 (two) times daily. 09/30/23   Particia Nearing, PA-C  cetirizine (ZYRTEC ALLERGY) 10 MG tablet Take 1 tablet (10 mg total) by mouth daily. 07/20/23   Leath-Warren, Sadie Haber, NP  Cholecalciferol (VITAMIN D3) 50 MCG (2000 UT) capsule Take 1 capsule (2,000 Units total) by mouth daily. 09/27/22   Daryll Drown, NP  cyclobenzaprine (FLEXERIL) 5 MG tablet Take 1 tablet (5 mg total) by mouth 3 (three) times daily as needed for  muscle spasms. 09/15/23   Sonny Masters, FNP  dapagliflozin propanediol (FARXIGA) 10 MG TABS tablet Take 1 tablet (10 mg total) by mouth daily before breakfast. 11/04/23   Daphine Deutscher, Mary-Margaret, FNP  DM-APAP-CPM (CORICIDIN HBP) 10-325-2 MG TABS Take 1 tablet by mouth 3 (three) times daily as needed. 09/30/23   Particia Nearing, PA-C  fluticasone [facility]) 50 MCG/ACT nasal spray Place 2 sprays into both nostrils daily.  07/20/23   St Vena Austria, NP  glipiZIDE (GLUCOTROL) 5 MG tablet Take 1 tablet (5 mg total) by mouth 2 (two) times daily before a meal. 11/04/23   Daphine Deutscher, Mary-Margaret, FNP  lisinopril (ZESTRIL) 20 MG tablet Take 1 tablet (20 mg total) by mouth daily. 08/01/23   Daphine Deutscher, Mary-Margaret, FNP  lubiprostone (AMITIZA) 24 MCG capsule Take 1 capsule (24 mcg total) by mouth 2 (two) times daily. 08/01/23   Daphine Deutscher, Mary-Margaret, FNP  pantoprazole (PROTONIX) 40 MG tablet Take 1 tablet (40 mg total) by mouth daily. 11/04/23   Daphine Deutscher, Mary-Margaret, FNP  potassium chloride SA (KLOR-CON M) 20 MEQ tablet Take 1 tablet (20 mEq total) by mouth 2 (two) times daily. 10/24/23   Dione Booze, MD  rosuvastatin (CRESTOR) 10 MG tablet Take 1 tablet (10 mg total) by mouth daily. 08/01/23   Daphine Deutscher, Mary-Margaret, FNP  torsemide (DEMADEX) 100 MG tablet Take 1 tablet (100 mg total) by mouth daily. 08/01/23   Bennie Pierini, FNP                                                                                                                                    Past Surgical History Past Surgical History:  Procedure Laterality Date   BALLOON DILATION N/A 02/22/2022   Procedure: BALLOON DILATION;  Surgeon: Lanelle Bal, DO;  Location: [facility];  Service: Endoscopy;  Laterality: N/A;   BIOPSY  02/22/2022   Procedure: BIOPSY;  Surgeon: Lanelle Bal, DO;  Location: [facility] END[facility];  Service: Endoscopy;;   CARDIOVERSION N/A 03/18/2021   Procedure: CARDIOVERSION;  Surgeon: Jonelle Sidle, MD;  Location: [facility] ORS;  Service: Cardiovascular;  Laterality: N/A;   CESAREAN SECTION     times 2   COLONOSCOPY WITH PROPOFOL N/A 02/22/2022   Procedure: COLONOSCOPY WITH PROPOFOL;  Surgeon: Lanelle Bal, DO;  Location: [facility] END[facility];  Service: Endoscopy;  Laterality: N/A;  7:30am   CORONARY ANGIOGRAPHY N/A 06/09/2022   Procedure: CORONARY ANGIOGRAPHY;  Surgeon: Orbie Pyo, MD;  Location: [facility] INVASIVE CV LAB;   Service: Cardiovascular;  Laterality: N/A;   ESOPHAGOGASTRODUODENOSCOPY (EGD) WITH PROPOFOL N/A 02/22/2022   Procedure: ESOPHAGOGASTRODUODENOSCOPY (EGD) WITH PROPOFOL;  Surgeon: Lanelle Bal, DO;  Location: [facility];  Service: Endoscopy;  Laterality: N/A;   INCISION AND DRAINAGE ABSCESS Right    axilla   LITHOTRIPSY     POLYPECTOMY  02/22/2022   Procedure: POLYPECTOMY;  Surgeon: Lanelle Bal, DO;  Location: [facility];  Service: Endoscopy;;   Family History Family History  Problem Relation Age of Onset   Hypertension Mother    Cancer Father    Hypertension Father    Cancer Brother    Heart disease Paternal Grandmother    Breast cancer Neg Hx    Colon cancer Neg Hx     Social History Social History   Tobacco Use   Smoking status: Never   Smokeless tobacco: Never  Vaping Use   Vaping status: Never Used  Substance Use Topics   Alcohol use: No   Drug use: No   Allergies Metformin and related  Review of Systems Review of Systems  Cardiovascular:  Positive for palpitations.    Physical Exam Vital Signs  I have reviewed the triage vital signs BP (!) 98/57   Pulse 60   Temp 97.7 F (36.5 C) (Oral)   Resp 14   Ht 5\' 3"  (1.6 m)   Wt 118.2 kg   SpO2 100%   BMI 46.16 kg/m   Physical Exam Vitals and nursing note reviewed.  Constitutional:      General: She is not in acute distress.    Appearance: She is well-developed.  HENT:     Head: Normocephalic and atraumatic.  Eyes:     Conjunctiva/sclera: Conjunctivae normal.  Cardiovascular:     Rate and Rhythm: Tachycardia present. Rhythm irregular.     Heart sounds: No murmur heard. Pulmonary:     Effort: Pulmonary effort is normal. No respiratory distress.     Breath sounds: Normal breath sounds.  Abdominal:     Palpations: Abdomen is soft.     Tenderness: There is no abdominal tenderness.  Musculoskeletal:        General: No swelling.     Cervical back: Neck supple.  Skin:    General: Skin is  warm and dry.     Capillary Refill: Capillary refill takes less than 2 seconds.  Neurological:     Mental Status: She is alert.  Psychiatric:        Mood and Affect: Mood normal.     ED Results and Treatments Labs (all labs ordered are listed, but only abnormal results are displayed) Labs Reviewed  COMPREHENSIVE METABOLIC PANEL - Abnormal; Notable for the following components:      Result Value   Glucose, Bld 241 (*)    BUN 30 (*)    Creatinine, Ser 1.08 (*)    GFR, Estimated 58 (*)    All other components within normal limits  CBC WITH DIFFERENTIAL/PLATELET                                                                                                                          Radiology DG Shoulder Left Result Date: 11/23/2023 CLINICAL DATA:  Status post cardioversion. Evaluate for dislocation. EXAM: LEFT SHOULDER - 2+ VIEW COMPARISON:  None Available. FINDINGS: Three views study shows no evidence for humeral head dislocation. No evidence for shoulder separation  or dislocation. IMPRESSION: Negative. Electronically Signed   By: Kennith Center M.D.   On: 11/23/2023 06:37    Pertinent labs & imaging results that were available during my care of the patient were reviewed by me and considered in my medical decision making (see MDM for details).  Medications Ordered in ED Medications  diltiazem (CARDIZEM) injection 10 mg (10 mg Intravenous Given 11/23/23 0421)  lactated ringers bolus 1,000 mL (0 mLs Intravenous Stopped 11/23/23 0642)  propofol (DIPRIVAN) 10 mg/mL bolus/IV push 118.2 mg (50 mg Intravenous Given 11/23/23 0507)  PHENYLephrine 80 mcg/ml in normal saline Adult IV Push Syringe (For Blood Pressure Support) (80 mcg Intravenous Given 11/23/23 0507)                                                                                                                                     Procedures .Critical Care  Performed by: Glendora Score, MD Authorized by: Glendora Score, MD    Critical care provider statement:    Critical care time (minutes):  30   Critical care was necessary to treat or prevent imminent or life-threatening deterioration of the following conditions:  Cardiac failure   Critical care was time spent personally by me on the following activities:  Development of treatment plan with patient or surrogate, discussions with consultants, evaluation of patient's response to treatment, examination of patient, ordering and review of laboratory studies, ordering and review of radiographic studies, ordering and performing treatments and interventions, pulse oximetry, re-evaluation of patient's condition and review of old charts .Cardioversion  Date/Time: 11/23/2023 6:46 AM  Performed by: Glendora Score, MD Authorized by: Glendora Score, MD   Consent:    Consent obtained:  Verbal   Consent given by:  Patient   Risks discussed:  Cutaneous burn, death, pain and induced arrhythmia   Alternatives discussed:  Rate-control medication Pre-procedure details:    Cardioversion basis:  Elective   Rhythm:  Atrial flutter   Electrode placement:  Anterior-posterior Patient sedated: Yes. Refer to sedation procedure documentation for details of sedation.  Attempt one:    Cardioversion mode:  Synchronous   Shock (Joules):  200   Shock outcome:  Conversion to normal sinus rhythm Post-procedure details:    Patient status:  Awake   Patient tolerance of procedure:  Tolerated well, no immediate complications .Sedation  Date/Time: 11/23/2023 6:47 AM  Performed by: Glendora Score, MD Authorized by: Glendora Score, MD   Consent:    Consent obtained:  Verbal   Consent given by:  Patient   Risks discussed:  Allergic reaction, dysrhythmia, inadequate sedation, nausea, prolonged hypoxia resulting in organ damage, prolonged sedation necessitating reversal, respiratory compromise necessitating ventilatory assistance and intubation and vomiting   Alternatives discussed:   Analgesia without sedation, anxiolysis and regional anesthesia Universal protocol:    Procedure explained and questions answered to patient or proxy's satisfaction: yes     Relevant documents present and verified: yes  Test results available: yes     Imaging studies available: yes     Required blood products, implants, devices, and special equipment available: yes     Site/side marked: yes     Immediately prior to procedure, a time out was called: yes     Patient identity confirmed:  Verbally with patient Indications:    Procedure necessitating sedation performed by:  Physician performing sedation Pre-sedation assessment:    Time since last food or drink:  0800   ASA classification: class 1 - normal, healthy patient     Mouth opening:  3 or more finger widths   Thyromental distance:  4 finger widths   Mallampati score:  I - soft palate, uvula, fauces, pillars visible   Neck mobility: normal     Pre-sedation assessments completed and reviewed: airway patency, cardiovascular function, hydration status, mental status, nausea/vomiting, pain level, respiratory function and temperature   A pre-sedation assessment was completed prior to the start of the procedure Immediate pre-procedure details:    Reassessment: Patient reassessed immediately prior to procedure     Reviewed: vital signs, relevant labs/tests and NPO status     Verified: bag valve mask available, emergency equipment available, intubation equipment available, IV patency confirmed, oxygen available and suction available   Procedure details (see MAR for exact dosages):    Preoxygenation:  Nasal cannula   Sedation:  Propofol   Intended level of sedation: deep   Intra-procedure monitoring:  Blood pressure monitoring, cardiac monitor, continuous pulse oximetry, frequent LOC assessments, frequent vital sign checks and continuous capnometry   Intra-procedure events: none     Total Provider sedation time (minutes):   15 Post-procedure details:   A post-sedation assessment was completed following the completion of the procedure.   Attendance: Constant attendance by certified staff until patient recovered     Recovery: Patient returned to pre-procedure baseline     Post-sedation assessments completed and reviewed: airway patency, cardiovascular function, hydration status, mental status, nausea/vomiting, pain level, respiratory function and temperature     Patient is stable for discharge or admission: yes     Procedure completion:  Tolerated well, no immediate complications   (including critical care time)  Medical Decision Making / ED Course   This patient presents to the ED for concern of palpitations, this involves an extensive number of treatment options, and is a complaint that carries with it a high risk of complications and morbidity.  The differential diagnosis includes A-fib, a flutter, electrolyte abnormality, dehydration  MDM: Patient seen emergency room for evaluation of palpitations.  Physical exam with a rapid irregular tachycardia but is otherwise unremarkable.  ECG confirms A-fib with RVR.  Laboratory evaluation is overall unremarkable outside of a glucose of 241, BUN 30, creatinine 1.08.  CO2 is normal and anion gap is normal.  Initial attempts at conversion were performed with diltiazem and fluid resuscitation.  Unfortunately, patient did not convert.  She has been compliant with her Eliquis and has not missed any doses and symptoms started a few hours prior to arrival.  As seen with previous similar episodes, she usually has good response to bedside cardioversion in the ER.  Thus, after shared decision-making discussion we did decide to proceed with bedside cardioversion as she does meet criteria to have this procedure done.  Propofol sedation performed and patient had return to sinus rhythm after single 200 J shock.  As propofol wore off, patient was complaining of some shoulder pain and  difficulty moving the shoulder.  Due to concern for possible posterior dislocation in the setting of electrical shock, an x-ray was obtained that was reassuringly negative for dislocation and on final reevaluation she has full range of motion of the shoulder and no persistent pain.  We had a discussion about her A-fib and patient likely would benefit from a discussion with her primary cardiologist about ablation as this is at least her third ER visit for this.  Referral sent to her primary cardiologist Dr. Antoine Poche.  At this time she does not meet inpatient criteria for admission and will be discharged with outpatient follow-up return precautions given of which she voiced understanding she was discharged.   Additional history obtained: -Additional history obtained from husband -External records from outside source obtained and reviewed including: Chart review including previous notes, labs, imaging, consultation notes   Lab Tests: -I ordered, reviewed, and interpreted labs.   The pertinent results include:   Labs Reviewed  COMPREHENSIVE METABOLIC PANEL - Abnormal; Notable for the following components:      Result Value   Glucose, Bld 241 (*)    BUN 30 (*)    Creatinine, Ser 1.08 (*)    GFR, Estimated 58 (*)    All other components within normal limits  CBC WITH DIFFERENTIAL/PLATELET      EKG   EKG Interpretation Date/Time:  Wednesday November 23 2023 05:10:14 EST Ventricular Rate:  74 PR Interval:  143 QRS Duration:  150 QT Interval:  397 QTC Calculation: 441 R Axis:   237  Text Interpretation: Sinus rhythm Ventricular premature complex Right bundle branch block Anterior infarct, age indeterminate Confirmed by Eldwin Volkov ([id number]) on 11/23/2023 5:19:13 AM         Imaging Studies ordered: I ordered imaging studies including shoulder x-ray I independently visualized and interpreted imaging. I agree with the radiologist interpretation   Medicines ordered and prescription drug  management: Meds ordered this encounter  Medications   diltiazem (CARDIZEM) injection 10 mg   lactated ringers bolus 1,000 mL   propofol (DIPRIVAN) 10 mg/mL bolus/IV push 118.2 mg   PHENYLephrine 80 mcg/ml in normal saline Adult IV Push Syringe (For Blood Pressure Support)    -I have reviewed the patients home medicines and have made adjustments as needed  Critical interventions none   Cardiac Monitoring: The patient was maintained on a cardiac monitor.  I personally viewed and interpreted the cardiac monitored which showed an underlying rhythm of: A-fib with VR, NSR  Social Determinants of Health:  Factors impacting patients care include: none   Reevaluation: After the interventions noted above, I reevaluated the patient and found that they have :improved  Co morbidities that complicate the patient evaluation  Past Medical History:  Diagnosis Date   Chronic back pain    Diabetes mellitus    Edema leg    Hyperlipidemia    Hypertension    Joint pain    Kidney stones    Other malaise and fatigue    PAF (paroxysmal atrial fibrillation) (HCC)    a. new diagnosis in 02/2021 --> s/p DCCV on 03/18/2021   RBBB    Sinus bradycardia    Sleep apnea       Dispostion: I considered admission for this patient, but at this time she does not meet inpatient criteria for admission and will be discharged with outpatient follow-up     Final Clinical Impression(s) / ED Diagnoses Final diagnoses:  Atrial fibrillation with RVR (HCC)     @PCDICTATION @    Ryna Beckstrom, Wyn Forster, MD  11/23/23 0651  

## 2023-11-23 NOTE — Sedation Documentation (Signed)
Pt shocked 200j

## 2023-11-28 ENCOUNTER — Other Ambulatory Visit (HOSPITAL_COMMUNITY): Payer: Self-pay

## 2023-11-28 ENCOUNTER — Telehealth: Payer: Self-pay

## 2023-11-28 NOTE — Telephone Encounter (Signed)
 Per test claim: Refill too soon. PA is not needed at this time. Medication was filled 11/28/2023. Next eligible fill date is 12/12/2023.

## 2023-11-28 NOTE — Telephone Encounter (Signed)
 Pharmacy Patient Advocate Encounter   Received notification from Patient Advice Request messages that prior authorization for Farxiga 10MG  is required/requested.   Insurance verification completed.   The patient is insured through [employer] .   Per test claim: Refill too soon. PA is not needed at this time. Medication was filled 11/28/2023. Next eligible fill date is 12/12/2023.

## 2023-12-26 ENCOUNTER — Other Ambulatory Visit: Payer: Self-pay | Admitting: Nurse Practitioner

## 2023-12-26 DIAGNOSIS — I10 Essential (primary) hypertension: Secondary | ICD-10-CM

## 2023-12-27 ENCOUNTER — Ambulatory Visit: Admitting: Nurse Practitioner

## 2023-12-27 ENCOUNTER — Encounter: Payer: Self-pay | Admitting: Nurse Practitioner

## 2023-12-27 VITALS — BP 99/64 | HR 52 | Temp 97.7°F | Ht 63.0 in | Wt 254.0 lb

## 2023-12-27 DIAGNOSIS — I959 Hypotension, unspecified: Secondary | ICD-10-CM

## 2023-12-27 DIAGNOSIS — R009 Unspecified abnormalities of heart beat: Secondary | ICD-10-CM

## 2023-12-27 NOTE — Progress Notes (Signed)
 Subjective:    Patient ID: Kathryn Merritt, female    DOB: 1960/11/22, 63 y.o.   MRN: [id number]   Chief Complaint: Blood pressure dropping (Feeling lightheaded)   HPI Patient in c/o blood pressure dropping. Causing dizziness. Blood pressure has been running around as low as 80's systolic. Started several days ago. She is currently on demadex, norvasc and lisinopril.  Patient Active Problem List   Diagnosis Date Noted   Diabetes mellitus treated with injections of non-insulin medication (HCC) 08/01/2023   Viral URI 07/20/2023   Hypercoagulable state due to paroxysmal atrial fibrillation (HCC) 03/29/2023   Lesion of left ear 06/24/2022   Vitamin D deficiency 01/21/2022   Stage 3a chronic kidney disease (HCC) 01/21/2022   Constipation due to outlet dysfunction 01/13/2022   Esophageal dysphagia 01/13/2022   Subclinical hypothyroidism 10/23/2021   Gastroesophageal reflux disease 07/16/2021   OSA (obstructive sleep apnea) 07/14/2021   Hypertension 06/18/2021   Hyperlipidemia associated with type 2 diabetes mellitus (HCC) 06/18/2021   Paroxysmal atrial fibrillation (HCC) 03/18/2021   Morbid obesity (HCC) 03/18/2021   Controlled type 2 diabetes mellitus without complication, without long-term current use of insulin (HCC) 03/18/2021       Review of Systems  Constitutional:  Negative for diaphoresis.  Eyes:  Negative for pain.  Respiratory:  Negative for shortness of breath.   Cardiovascular:  Negative for chest pain, palpitations and leg swelling.  Gastrointestinal:  Negative for abdominal pain.  Endocrine: Negative for polydipsia.  Skin:  Negative for rash.  Neurological:  Positive for dizziness. Negative for weakness and headaches.  Hematological:  Does not bruise/bleed easily.  All other systems reviewed and are negative.      Objective:   Physical Exam Constitutional:      Appearance: Normal appearance.  Cardiovascular:     Rate and Rhythm: Regular rhythm.  Bradycardia present.     Heart sounds: Normal heart sounds.  Pulmonary:     Effort: Pulmonary effort is normal.     Breath sounds: Normal breath sounds.  Skin:    General: Skin is warm.  Neurological:     General: No focal deficit present.     Mental Status: She is alert and oriented to person, place, and time.  Psychiatric:        Mood and Affect: Mood normal.        Behavior: Behavior normal.      BP 99/64   Pulse (!) 52   Temp 97.7 F (36.5 C) (Temporal)   Ht 5\' 3"  (1.6 m)   Wt 254 lb (115.2 kg)   SpO2 97%   BMI 44.99 kg/m    EKG- NSR with frequent PAC's     Assessment & Plan:   Kathryn Merritt in today with chief complaint of Blood pressure dropping (Feeling lightheaded)   1. Abnormal pulse rate (Primary) Force fluids Ok to have some caffeine Keep cardiology appt tomorrow - EKG 12-Lead  2. Hypotension, unspecified hypotension type Hold blood pressure meds today Continue  to check blood pressure at home and take readings with you to see cardiology tomorrow     The above assessment and management plan was discussed with the patient. The patient verbalized understanding of and has agreed to the management plan. Patient is aware to call the clinic if symptoms persist or worsen. Patient is aware when to return to the clinic for a follow-up visit. Patient educated on when it is appropriate to go to the emergency department.   Mary-Margaret Daphine Deutscher,  FNP

## 2023-12-27 NOTE — Progress Notes (Unsigned)
 Cardiology Office Note:   Date:  12/28/2023  ID:  Kathryn Merritt, DOB Sep 26, 1960, MRN [id number] PCP: Bennie Pierini, FNP  [facility] Providers Cardiologist:  Rollene Rotunda, MD Sleep Medicine:  Armanda Magic, MD {  History of Present Illness:   Kathryn Merritt is a 63 y.o. female who presents for she was recently admitted 02/2021 with new onset atrial fibrillation starting around 0200 day of admission. They could not exclude atypical atrial flutter per notes. She was started on IV diltiazem but underwent DCCV 03/18/21 with successful conversion to NSR. She was started on Eliquis and Toprol. She was also noted to have mildly elevated troponin values felt c/w demand ischemia (also had hypertensive urgency during admission). 2D echo 03/18/21 EF 65-70%, moderate LVH, normal diastolic parameters, normal RV, mild LAE.  She was in the ED in March 2023.  She had DCCV.    She was in the hospital with chest pain in Sept and and had no disease on cath.  EF was well preserved on echo.  She was seen in the Afib Clinic and was managed expectantly after reviewing various options.  Cost and weight mad some therapies less than ideal.  She was in the ED in March.  She had symptomatic atrial fib with rapid rate.  She had DCCV in the ED.   She saw her primary provider a couple of days ago.  She was having more palpitations.  She denies any frequent atrial ectopy.  She had low blood pressures and her lisinopril and amlodipine have been held for the last couple of days.  Her blood pressures have gone up into the 120s  110s from being in the 90s.  Heart rate is low in the 50s and she says it has been apparently in the 30s at times.  She has not felt any of more of the tachypalpitations that she was having when she has her atrial fibrillation.  She does feel weak with her lower blood pressure and lower heart rate.  She is not having any new chest pressure, neck or arm discomfort.  She has some episodic sporadic chest  discomfort.  She is not having any new breath, PND or orthopnea.  ROS: As stated in the HPI and negative for all other systems.  Studies Reviewed:    EKG:   Sinus rhythm with frequent and consecutive premature atrial contractions, right bundle branch block, poor anterior R wave progression, low voltage in the limb and chest leads  Risk Assessment/Calculations:    CHA2DS2-VASc Score = 4   This indicates a 4.8% annual risk of stroke. The patient's score is based upon: CHF History: 0 HTN History: 1 Diabetes History: 1 Stroke History: 0 Vascular Disease History: 1 Age Score: 0 Gender Score: 1     Physical Exam:   VS:  BP (!) 144/80   Pulse 60   Ht 5\' 3"  (1.6 m)   Wt 259 lb (117.5 kg)   BMI 45.88 kg/m    Wt Readings from Last 3 Encounters:  12/28/23 259 lb (117.5 kg)  12/27/23 254 lb (115.2 kg)  11/23/23 260 lb 9.3 oz (118.2 kg)     GEN: Well nourished, well developed in no acute distress NECK: No JVD; No carotid bruits CARDIAC: RRR, no murmurs, rubs, gallops RESPIRATORY:  Clear to auscultation without rales, wheezing or rhonchi  ABDOMEN: Soft, non-tender, non-distended EXTREMITIES:  No edema; No deformity   ASSESSMENT AND PLAN:    Paroxysmal atrial fibrillation:    I  am going to send her to EP to discuss the possibility of ablation although her untreated sleep apnea and her weight may preclude this as an option.  Managing her with antiarrhythmics I think would be somewhat problematic with her bradycardia arrhythmias.   Essential HTN:   Blood pressure is low and I am going to hold her amlodipine and lisinopril and she will follow her blood pressures.  DM: A1c was 9.2.  This is up from previous 6.5 and she has had continued and recent adjustment of her meds.  I will defer to her primary provider.  I reviewed these notes.    Morbid obesity: I have discussed diet and given her a goal of a 10 pound weight loss in the next 3 months as a minimum.   Sleep apnea:   She  previously could not afford this but she is going to call back the company that supplies equipment and see if she can restart this.  She understands importance of sleep apnea management for control of her rhythm.   Follow up with me in 4 months or sooner.   Signed, Rollene Rotunda, MD

## 2023-12-27 NOTE — Patient Instructions (Signed)

## 2023-12-28 ENCOUNTER — Ambulatory Visit: Payer: Commercial Managed Care - HMO | Admitting: Cardiology

## 2023-12-28 ENCOUNTER — Ambulatory Visit: Attending: Cardiology

## 2023-12-28 ENCOUNTER — Encounter: Payer: Self-pay | Admitting: *Deleted

## 2023-12-28 ENCOUNTER — Encounter: Payer: Self-pay | Admitting: Cardiology

## 2023-12-28 VITALS — BP 144/80 | HR 60 | Ht 63.0 in | Wt 259.0 lb

## 2023-12-28 DIAGNOSIS — I480 Paroxysmal atrial fibrillation: Secondary | ICD-10-CM

## 2023-12-28 DIAGNOSIS — E118 Type 2 diabetes mellitus with unspecified complications: Secondary | ICD-10-CM

## 2023-12-28 DIAGNOSIS — R072 Precordial pain: Secondary | ICD-10-CM

## 2023-12-28 DIAGNOSIS — R002 Palpitations: Secondary | ICD-10-CM

## 2023-12-28 DIAGNOSIS — G4730 Sleep apnea, unspecified: Secondary | ICD-10-CM

## 2023-12-28 LAB — CMP14+EGFR
ALT: 14 IU/L (ref 0–32)
AST: 14 IU/L (ref 0–40)
Albumin: 3.8 g/dL — ABNORMAL LOW (ref 3.9–4.9)
Alkaline Phosphatase: 74 IU/L (ref 44–121)
BUN/Creatinine Ratio: 27 (ref 12–28)
BUN: 35 mg/dL — ABNORMAL HIGH (ref 8–27)
Bilirubin Total: 0.4 mg/dL (ref 0.0–1.2)
CO2: 21 mmol/L (ref 20–29)
Calcium: 9.7 mg/dL (ref 8.7–10.3)
Chloride: 110 mmol/L — ABNORMAL HIGH (ref 96–106)
Creatinine, Ser: 1.32 mg/dL — ABNORMAL HIGH (ref 0.57–1.00)
Globulin, Total: 2.2 g/dL (ref 1.5–4.5)
Glucose: 169 mg/dL — ABNORMAL HIGH (ref 70–99)
Potassium: 4.3 mmol/L (ref 3.5–5.2)
Sodium: 144 mmol/L (ref 134–144)
Total Protein: 6 g/dL (ref 6.0–8.5)
eGFR: 46 mL/min/{1.73_m2} — ABNORMAL LOW (ref >59)

## 2023-12-28 LAB — CBC WITH DIFFERENTIAL/PLATELET
Basophils Absolute: 0 10*3/uL (ref 0.0–0.2)
Basos: 1 %
EOS (ABSOLUTE): 0.2 10*3/uL (ref 0.0–0.4)
Eos: 4 %
Hematocrit: 42.2 % (ref 34.0–46.6)
Hemoglobin: 13.9 g/dL (ref 11.1–15.9)
Immature Grans (Abs): 0 10*3/uL (ref 0.0–0.1)
Immature Granulocytes: 0 %
Lymphocytes Absolute: 2.3 10*3/uL (ref 0.7–3.1)
Lymphs: 43 %
MCH: 30.3 pg (ref 26.6–33.0)
MCHC: 32.9 g/dL (ref 31.5–35.7)
MCV: 92 fL (ref 79–97)
Monocytes Absolute: 0.4 10*3/uL (ref 0.1–0.9)
Monocytes: 7 %
Neutrophils Absolute: 2.5 10*3/uL (ref 1.4–7.0)
Neutrophils: 45 %
Platelets: 195 10*3/uL (ref 150–450)
RBC: 4.59 x10E6/uL (ref 3.77–5.28)
RDW: 13.1 % (ref 11.7–15.4)
WBC: 5.4 10*3/uL (ref 3.4–10.8)

## 2023-12-28 LAB — THYROID PANEL WITH TSH
Free Thyroxine Index: 1.5 (ref 1.2–4.9)
T3 Uptake Ratio: 28 % (ref 24–39)
T4, Total: 5.3 ug/dL (ref 4.5–12.0)
TSH: 3.47 u[IU]/mL (ref 0.450–4.500)

## 2023-12-28 LAB — LIPID PANEL
Chol/HDL Ratio: 2.9 ratio (ref 0.0–4.4)
Cholesterol, Total: 133 mg/dL (ref 100–199)
HDL: 46 mg/dL (ref >39)
LDL Chol Calc (NIH): 63 mg/dL (ref 0–99)
Triglycerides: 136 mg/dL (ref 0–149)
VLDL Cholesterol Cal: 24 mg/dL (ref 5–40)

## 2023-12-28 NOTE — Patient Instructions (Addendum)
 Medication Instructions:  Please discontinue your Amlodipine and Lisinopril. Continue all other medications as listed.  *If you need a refill on your cardiac medications before your next appointment, please call your pharmacy*  have any lab test that is abnormal or we need to change your treatment, we will call you to review the results.  Testing/Procedures: Kathryn Merritt- Long Term Monitor Instructions  Your physician has requested you wear a ZIO patch monitor for 3 days.  This is a single patch monitor. [employer] supplies one patch monitor per enrollment. Additional stickers are not available. Please do not apply patch if you will be having a Nuclear Stress Test,  Echocardiogram, Cardiac CT, MRI, or Chest Xray during the period you would be wearing the  monitor. The patch cannot be worn during these tests. You cannot remove and re-apply the  ZIO XT patch monitor.  Your ZIO patch monitor will be mailed 3 day USPS to your address on file. It may take 3-5 days  to receive your monitor after you have been enrolled.  Once you have received your monitor, please review the enclosed instructions. Your monitor  has already been registered assigning a specific monitor serial # to you.  Billing and Patient Assistance Program Information  We have supplied [employer] with any of your insurance information on file for billing purposes. [employer] offers a sliding scale Patient Assistance Program for patients that do not have  insurance, or whose insurance does not completely cover the cost of the ZIO monitor.  You must apply for the Patient Assistance Program to qualify for this discounted rate.  To apply, please call [employer] at [phone], select option 4, select option 2, ask to apply for  Patient Assistance Program. Meredeth Ide will ask your household income, and how many people  are in your household. They will quote your out-of-pocket cost based on that information.  [employer] will also be able to set up a  56-month, interest-free payment plan if needed.  Applying the monitor   Shave hair from upper left chest.  Hold abrader disc by orange tab. Rub abrader in 40 strokes over the upper left chest as  indicated in your monitor instructions.  Clean area with 4 enclosed alcohol pads. Let dry.  Apply patch as indicated in monitor instructions. Patch will be placed under collarbone on left  side of chest with arrow pointing upward.  Rub patch adhesive wings for 2 minutes. Remove white label marked "1". Remove the white  label marked "2". Rub patch adhesive wings for 2 additional minutes.  While looking in a mirror, press and release button in center of patch. A small green light will  flash 3-4 times. This will be your only indicator that the monitor has been turned on.  Do not shower for the first 24 hours. You may shower after the first 24 hours.  Press the button if you feel a symptom. You will hear a small click. Record Date, Time and  Symptom in the Patient Logbook.  When you are ready to remove the patch, follow instructions on the last 2 pages of Patient  Logbook. Stick patch monitor onto the last page of Patient Logbook.  Place Patient Logbook in the blue and white box. Use locking tab on box and tape box closed  securely. The blue and white box has prepaid postage on it. Please place it in the mailbox as  soon as possible. Your physician should have your test results approximately 7 days after the  monitor has  been mailed back to [place].  Call [facility] Customer Care at [phone] if you have questions regarding  your ZIO XT patch monitor. Call them immediately if you see an orange light blinking on your  monitor.  If your monitor falls off in less than 4 days, contact our Monitor department at [phone].  If your monitor becomes loose or falls off after 4 days call [employer] at [phone] for  suggestions on securing your monitor  You have been referred to  Electrophysiology for Atrial Fibrillation.  Follow-Up: At [facility], you and your health needs are our priority.  As part of our continuing mission to provide you with exceptional heart care, our providers are all part of one team.  This team includes your primary Cardiologist (physician) and Advanced Practice Providers or APPs (Physician Assistants and Nurse Practitioners) who all work together to provide you with the care you need, when you need it.  Your next appointment:   4 month(s)  Provider:   Rollene Rotunda, MD    We recommend signing up for the patient portal called "MyChart".  Sign up information is provided on this After Visit Summary.  MyChart is used to connect with patients for Virtual Visits (Telemedicine).  Patients are able to view lab/test results, encounter notes, upcoming appointments, etc.  Non-urgent messages can be sent to your provider as well.   To learn more about what you can do with MyChart, go to [email or web address].     1st Floor: - Lobby - Registration  - Pharmacy  - Lab - Cafe  2nd Floor: - PV Lab - Diagnostic Testing (echo, CT, nuclear med)  3rd Floor: - Vacant  4th Floor: - TCTS (cardiothoracic surgery) - AFib Clinic - Structural Heart Clinic - Vascular Surgery  - Vascular Ultrasound  5th Floor: - HeartCare Cardiology (general and EP) - Clinical Pharmacy for coumadin, hypertension, lipid, weight-loss medications, and med management appointments    Valet parking services will be available as well.

## 2023-12-28 NOTE — Progress Notes (Unsigned)
 Enrolled for [employer] to mail a ZIO XT long term holter monitor to the patients address on file.

## 2024-01-03 ENCOUNTER — Encounter: Payer: Self-pay | Admitting: Cardiology

## 2024-01-03 ENCOUNTER — Ambulatory Visit: Attending: Cardiology | Admitting: Cardiology

## 2024-01-03 VITALS — BP 98/82 | HR 65 | Ht 63.0 in | Wt 251.0 lb

## 2024-01-03 DIAGNOSIS — I480 Paroxysmal atrial fibrillation: Secondary | ICD-10-CM

## 2024-01-03 DIAGNOSIS — D6869 Other thrombophilia: Secondary | ICD-10-CM | POA: Diagnosis not present

## 2024-01-03 DIAGNOSIS — I4510 Unspecified right bundle-branch block: Secondary | ICD-10-CM | POA: Diagnosis not present

## 2024-01-03 DIAGNOSIS — G4733 Obstructive sleep apnea (adult) (pediatric): Secondary | ICD-10-CM

## 2024-01-03 NOTE — Progress Notes (Signed)
 Electrophysiology Office Note:   Date:  01/03/2024  ID:  Kathryn Merritt, DOB 20-Jun-1961, MRN [id number]  Primary Cardiologist: Rollene Rotunda, MD Electrophysiologist: Nobie Putnam, MD      History of Present Illness:   Kathryn Merritt is a 63 y.o. female with h/o HTN, diabetes, OSA, morbid obesity, and paroxysmal atrial fibrillation who is being seen today for evaluation of her atrial fibrillation.  Discussed the use of AI scribe software for clinical note transcription with the patient, who gave verbal consent to proceed.  History of Present Illness She has a history of atrial fibrillation first diagnosed in 2022, for which she has undergone four cardioversions. She had recurrence requiring ED visit in March of 2023, requiring DCCV. She presented to ED again in March of 2025 with atrial fibrillation with rapid ventricular rates. Recently, she started experiencing frequent premature atrial contractions. Last Monday, after working, she felt extremely tired and noticed her blood pressure dropped to 88/48 mmHg. Her daughter-in-law confirmed the low blood pressure and noted her heart was not beating correctly, although it was not fast. An EKG performed by her primary care physician on Tuesday showed frequent PACs. She experienced similar symptoms on Friday and feels she might be having PACs more frequently. She describes her episodes of AF as heart pounding, racing, and skipping during these episodes, which also make her feel more tired and short of breath, impacting her daily activities. She reports her intrinsic heart rate tends to run slow, and for that reason she is not currently on any medication to prevent AFib episodes. She has a history of sleep apnea and recently resumed using her CPAP machine starting Saturday night.   Review of systems complete and found to be negative unless listed in HPI.   EP Information / Studies Reviewed:    EKG is ordered today. Personal review as below. Sinus  bradycardia with competing ectopic atrial rhythm.      EKG 11/23/23: AF   LHC 05/2022:  1.  Normal right dominant circulation with no evidence of spasm, dissection, or bridging. 2.  LVEDP not assessed due to upper extremity vasospasm.   Echo 05/2022:   1. Left ventricular ejection fraction, by estimation, is 65 to 70%. The  left ventricle has normal function. The left ventricle has no regional  wall motion abnormalities. There is mild left ventricular hypertrophy.  Left ventricular diastolic parameters  are consistent with Grade I diastolic dysfunction (impaired relaxation).  Elevated left atrial pressure.   2. Right ventricular systolic function is normal. The right ventricular  size is normal. There is normal pulmonary artery systolic pressure.   3. Left atrial size was moderately dilated.   4. Right atrial size was mild to moderately dilated.   5. The mitral valve is normal in structure. No evidence of mitral valve  regurgitation. No evidence of mitral stenosis.   6. The tricuspid valve is abnormal.   7. The aortic valve is tricuspid. Aortic valve regurgitation is not  visualized. No aortic stenosis is present.   8. The inferior vena cava is normal in size with greater than 50%  respiratory variability, suggesting right atrial pressure of 3 mmHg.   Risk Assessment/Calculations:    CHA2DS2-VASc Score = 4   This indicates a 4.8% annual risk of stroke. The patient's score is based upon: CHF History: 0 HTN History: 1 Diabetes History: 1 Stroke History: 0 Vascular Disease History: 1 Age Score: 0 Gender Score: 1  Physical Exam:   VS:  BP 98/82   Pulse 65   Ht 5\' 3"  (1.6 m)   Wt 251 lb (113.9 kg)   SpO2 99%   BMI 44.46 kg/m    Wt Readings from Last 3 Encounters:  01/03/24 251 lb (113.9 kg)  12/28/23 259 lb (117.5 kg)  12/27/23 254 lb (115.2 kg)     GEN: Well nourished, well developed in no acute distress NECK: No JVD CARDIAC: Normal rate, regular  rhythm RESPIRATORY:  Clear to auscultation without rales, wheezing or rhonchi  ABDOMEN: Soft, non-distended EXTREMITIES:  No edema; No deformity   ASSESSMENT AND PLAN:    #. Paroxysmal atrial fibrillation, symptomatic: Given her resting bradycardia, she is not on any rate controlling medications. This also limits her AAD options somewhat. Unfortunately, she is currently not a candidate for catheter ablation due to high BMI and body habitus. Tikosyn appears to be her best option for rhythm control at this time with goal of weight loss and bridge to ablation. #. RBBB: Unable to use class Ic agents.  - Refer for Tikosyn loading. Baseline Qtc today. Creatinine 1.3.   #. Secondary hypercoagulable state due to atrial fibrillation: CHADSVASC score of 4.  -Continue Eliquis 5mg  BID.   #. OSA: Recently started using CPAP.  -Encouraged CPAP use to reduce risk of AF recurrence.   #. Morbid obesity: This places her at high risk of groin access complications for ablation.   - Encouraged weight loss.   Follow up with Dr. Daneil Dunker in 6 months  Total time of encounter: 61 minutes total time of encounter, including chart review, face-to-face patient care, coordination of care and counseling regarding high complexity medical decision making.  Signed, Ardeen Kohler, MD

## 2024-01-03 NOTE — Patient Instructions (Signed)
 Medication Instructions:  Your physician recommends that you continue on your current medications as directed. Please refer to the Current Medication list given to you today.  *If you need a refill on your cardiac medications before your next appointment, please call your pharmacy*  Follow-Up: At [facility], you and your health needs are our priority.  As part of our continuing mission to provide you with exceptional heart care, our providers are all part of one team.  This team includes your primary Cardiologist (physician) and Advanced Practice Providers or APPs (Physician Assistants and Nurse Practitioners) who all work together to provide you with the care you need, when you need it.  Tikosyn (Dofetilide) Hospital Admission   Prior to day of admission:  Check with drug insurance company for cost of drug to ensure affordability --- Dofetilide 500 mcg twice a day.  GoodRx is an option if insurance copay is unaffordable.    No Benadryl is allowed 3 days prior to admission.   Please ensure no missed doses of your anticoagulation (blood thinner) for 3 weeks prior to admission. If a dose is missed please notify our office immediately.   A pharmacist will review all your medications for potential interactions with Tikosyn. If any medication changes are needed prior to admission we will be in touch with you.   If any new medications are started AFTER your admission date is set with Radio producer. Please notify our office immediately so your medication list can be updated and reviewed by our pharmacist again.  On day of admission:  Tikosyn initiation requires a 3 night/4 day hospital stay with constant telemetry monitoring. You will have an EKG after each dose of Tikosyn as well as daily lab draws.   If the drug does not convert you to normal rhythm a cardioversion after the 4th dose of Tikosyn.   Afib Clinic office visit on the morning of admission is needed for preliminary labs/ekg.    Time of admission is dependent on bed availability in the hospital. In some instances, you will be sent home until bed is available. Rarely admission can be delayed to the following day if hospital census prevents available beds.   You may bring personal belongings/clothing with you to the hospital. Please leave your suitcase in the car until you arrive in admissions.   Questions please call our office at [phone]        1st Floor: - Lobby - Registration  - Pharmacy  - Lab - Cafe  2nd Floor: - PV Lab - Diagnostic Testing (echo, CT, nuclear med)  3rd Floor: - Vacant  4th Floor: - TCTS (cardiothoracic surgery) - AFib Clinic - Structural Heart Clinic - Vascular Surgery  - Vascular Ultrasound  5th Floor: - HeartCare Cardiology (general and EP) - Clinical Pharmacy for coumadin, hypertension, lipid, weight-loss medications, and med management appointments    Valet parking services will be available as well.       ;

## 2024-01-04 ENCOUNTER — Telehealth: Payer: Self-pay | Admitting: Pharmacist

## 2024-01-04 NOTE — Telephone Encounter (Signed)
 Medication list reviewed in anticipation of upcoming Tikosyn initiation. Patient is taking 2 contraindicated or QTc prolonging medications.   Concurrent use of CYCLOBENZAPRINE and QT INTERVAL PROLONGING DRUGS may result in an increased risk of QT interval prolongation. Recommend patient ask for a different PRN muscle relaxer from PCP such as methocarbamol or robaxin.  Concurrent use of DOFETILIDE and DIURETICS may result in an increased risk of cardiotoxicity (QT prolongation, torsades de pointes, cardiac arrest). Monitor while admitted.   Patient is anticoagulated on Eliquis on the appropriate dose. Please ensure that patient has not missed any anticoagulation doses in the 3 weeks prior to Tikosyn initiation.   Patient will need to be counseled to avoid use of Benadryl while on Tikosyn and in the 2-3 days prior to Tikosyn initiation.

## 2024-01-04 NOTE — Telephone Encounter (Signed)
-----   Message from Nurse Stacy C sent at 01/04/2024  2:00 PM EDT ----- Regarding: tikosyn Pt for tikosyn please review meds thanks stacy ----- Message ----- From: Chauvigne, Carlyle, RN Sent: 01/03/2024   3:01 PM EDT To: Tess Fife, RN  Tikosyn admission per Dr. Daneil Dunker.  Thanks! Carly

## 2024-01-09 ENCOUNTER — Ambulatory Visit

## 2024-01-15 DIAGNOSIS — R002 Palpitations: Secondary | ICD-10-CM

## 2024-01-15 DIAGNOSIS — I480 Paroxysmal atrial fibrillation: Secondary | ICD-10-CM

## 2024-01-18 ENCOUNTER — Encounter (HOSPITAL_COMMUNITY): Payer: Self-pay

## 2024-01-18 NOTE — Telephone Encounter (Signed)
Left message to discuss scheduling.

## 2024-01-20 ENCOUNTER — Other Ambulatory Visit (HOSPITAL_COMMUNITY): Payer: Self-pay

## 2024-01-20 ENCOUNTER — Telehealth (HOSPITAL_COMMUNITY): Payer: Self-pay | Admitting: Pharmacy Technician

## 2024-01-20 NOTE — Telephone Encounter (Signed)
 Pt does not take flexeril  will remove from list.

## 2024-01-20 NOTE — Telephone Encounter (Signed)
 Patient Product/process development scientist completed.    The patient is insured through [employer]. Patient has [employer], may use a copay card, and/or apply for patient assistance if available.    Ran test claim for dofetilide (Tikosyn) 500 mcg and the current 30 day co-pay is $17.31.   This test claim was processed through [facility]- copay amounts may vary at other pharmacies due to pharmacy/plan contracts, or as the patient moves through the different stages of their insurance plan.     Morgan Arab, CPHT Pharmacy Technician III Certified Patient Advocate [facility] Patient Advocate Team Direct Number: [phone]  Fax: [phone]

## 2024-01-20 NOTE — Addendum Note (Signed)
 Addended by: Tess Fife on: 01/20/2024 03:12 PM   Modules accepted: Orders

## 2024-01-31 ENCOUNTER — Ambulatory Visit: Payer: Commercial Managed Care - HMO | Admitting: Nurse Practitioner

## 2024-02-02 ENCOUNTER — Encounter: Payer: Self-pay | Admitting: Nurse Practitioner

## 2024-02-02 ENCOUNTER — Ambulatory Visit: Payer: Self-pay

## 2024-02-02 ENCOUNTER — Ambulatory Visit: Admitting: Nurse Practitioner

## 2024-02-02 VITALS — BP 136/68 | HR 63 | Temp 97.3°F | Ht 63.0 in | Wt 253.4 lb

## 2024-02-02 DIAGNOSIS — R002 Palpitations: Secondary | ICD-10-CM | POA: Diagnosis not present

## 2024-02-02 NOTE — Telephone Encounter (Signed)
  Chief Complaint: htn, palpitations Symptoms: racing sensation, elevated blood pressure reading Frequency: intermittent Pertinent Negatives: Patient denies current chest pain and shortness of breath, numbness, tingling, weakness Disposition: [] ED /[] Urgent Care (no appt availability in office) / [x] Appointment(In office/virtual)/ []  [place] [facility]/ [] Home Care/ [] Refused Recommended Disposition /[] [facility]/ []  Follow-up with PCP Additional Notes:  Elevated blood pressure today. She was taken off bp medication in April due to hypotension. She has been monitoring her bp since and noticing it increasing. Feels like heart is racing today, hr 84, her baseline is 60's history of afib. Feels palpitations are becoming more frequent. She experiences shortness of breath and chest tightness with palpitations. She reports she is scheduled for tikosyn initiation end of the month.     Copied from CRM [id number]. Topic: Clinical - Red Word Triage >> Feb 02, 2024  2:29 PM Brynn Caras wrote: Red Word that prompted transfer to Nurse Triage: 143/77, 160/99, 150/94 bp readings with heart rate is at an 84, which is higher than usual for the patients. Reason for Disposition  Systolic BP  >= 160 OR Diastolic >= 100  History of heart disease (i.e., heart attack, bypass surgery, angina, angioplasty, CHF)  (Exception: Brief heartbeat symptoms that went away and now feels well.)  Protocols used: Blood Pressure - High-A-AH, Heart Rate and Heartbeat Questions-A-AH

## 2024-02-02 NOTE — Progress Notes (Signed)
 Acute Office Visit  Subjective:     Patient ID: Kathryn Merritt, female    DOB: 1960/11/26, 63 y.o.   MRN: [id number]  Chief Complaint  Patient presents with   Palpitations    Having heart palpitations, symptoms started today   Headache   Hypertension    Was taken off bp medications in april    HPI Kathryn Merritt is a 63 year old female presenting on Feb 02, 2024, for an acute visit due to concerns of dizziness and fatigue. The patient reports that while at work, she was "hanging some letters" and began to feel dizzy and tired. She checked her blood pressure at that time, which was 166/89 mmHg, with a heart rate of 84 bpm. She has a known history of atrial fibrillation and arrhythmias.  She notes that her blood pressure medication was recently discontinued due to episodes of hypotension. The patient is currently prescribed torsemide  100 mg as needed, and she confirms taking a dose this morning. In the clinic, her vital signs were within normal limits.  She was educated that electrolyte imbalances, including low potassium or magnesium, are a common side effect of loop diuretics such as torsemide . Labs will be ordered to evaluate her electrolyte levels and renal function to assess for any contributing factors to her symptoms.   Active Ambulatory Problems    Diagnosis Date Noted   Paroxysmal atrial fibrillation (HCC) 03/18/2021   Morbid obesity (HCC) 03/18/2021   Controlled type 2 diabetes mellitus without complication, without long-term current use of insulin  (HCC) 03/18/2021   Hypertension 06/18/2021   Hyperlipidemia associated with type 2 diabetes mellitus (HCC) 06/18/2021   OSA (obstructive sleep apnea) 07/14/2021   Gastroesophageal reflux disease 07/16/2021   Subclinical hypothyroidism 10/23/2021   Constipation due to outlet dysfunction 01/13/2022   Esophageal dysphagia 01/13/2022   Vitamin D  deficiency 01/21/2022   Stage 3a chronic kidney disease (HCC) 01/21/2022   Lesion  of left ear 06/24/2022   Hypercoagulable state due to paroxysmal atrial fibrillation (HCC) 03/29/2023   Viral URI 07/20/2023   Diabetes mellitus treated with injections of non-insulin  medication (HCC) 08/01/2023   Palpitations 02/02/2024   Resolved Ambulatory Problems    Diagnosis Date Noted   BRADYCARDIA 01/16/2010   FATIGUE 01/16/2010   Edema 01/16/2010   Establishing care with new doctor, encounter for 06/18/2021   Bronchitis 09/09/2021   Annual physical exam 09/23/2021   Abdominal pain 10/23/2021   Urinary tract infection with hematuria 10/23/2021   Encounter for screening colonoscopy 01/13/2022   Acute cough 01/21/2022   Sore throat 01/21/2022   Strep pharyngitis 01/21/2022   Chest pain 06/07/2022   Long-term current use of injectable noninsulin antidiabetic medication 03/29/2023   Nasal congestion 07/20/2023   Past Medical History:  Diagnosis Date   Chronic back pain    Diabetes mellitus    Edema leg    Hyperlipidemia    Joint pain    Kidney stones    PAF (paroxysmal atrial fibrillation) (HCC)    RBBB    Sinus bradycardia    Sleep apnea     Review of Systems  Constitutional:  Negative for fever and weight loss.  HENT:  Negative for hearing loss and sore throat.   Eyes:  Negative for pain.  Respiratory:  Negative for cough and wheezing.   Cardiovascular:  Positive for palpitations. Negative for chest pain and leg swelling.       While at work earlier today  Gastrointestinal:  Negative for nausea and vomiting.  Skin:  Negative for itching and rash.  Neurological:  Negative for dizziness and headaches.   Negative unless indicated in HPI    Objective:    BP 136/68   Pulse 63   Temp (!) 97.3 F (36.3 C) (Temporal)   Ht 5\' 3"  (1.6 m)   Wt 253 lb 6.4 oz (114.9 kg)   SpO2 96%   BMI 44.89 kg/m  BP Readings from Last 3 Encounters:  02/02/24 136/68  01/03/24 98/82  12/28/23 (!) 144/80   Wt Readings from Last 3 Encounters:  02/02/24 253 lb 6.4 oz (114.9  kg)  01/03/24 251 lb (113.9 kg)  12/28/23 259 lb (117.5 kg)      Physical Exam Vitals and nursing note reviewed.  Constitutional:      General: She is not in acute distress.    Appearance: She is obese.  HENT:     Head: Normocephalic and atraumatic.     Nose: Nose normal.     Mouth/Throat:     Mouth: Mucous membranes are moist.  Eyes:     General: No scleral icterus.    Extraocular Movements: Extraocular movements intact.     Conjunctiva/sclera: Conjunctivae normal.     Pupils: Pupils are equal, round, and reactive to light.  Cardiovascular:     Pulses: Normal pulses.     Heart sounds: Normal heart sounds. No murmur heard.    No friction rub.  Pulmonary:     Effort: Pulmonary effort is normal.     Breath sounds: Normal breath sounds.  Abdominal:     Palpations: Abdomen is soft.  Skin:    General: Skin is warm and dry.     Findings: No rash.  Neurological:     Mental Status: She is alert and oriented to person, place, and time. Mental status is at baseline.  Psychiatric:        Mood and Affect: Mood normal.        Behavior: Behavior normal.        Thought Content: Thought content normal.        Judgment: Judgment normal.     No results found for any visits on 02/02/24.      Assessment & Plan:  Palpitations -     BMP8+EGFR -     Magnesium   Kathryn Merritt is a 63 year old Caucasian female seen today for palpitation, no acute distress Palpitation: BMP and mag ordered result pending Client to monitor palpitation at home and see if it is occurring with torsemide  administration Continue all previously prescribed medication Encourage healthy lifestyle choices, including diet (rich in fruits, vegetables, and lean proteins, and low in salt and simple carbohydrates) and exercise (at least 30 minutes of moderate physical activity daily).     The above assessment and management plan was discussed with the patient. The patient verbalized understanding of and has agreed to the  management plan. Patient is aware to call the clinic if they develop any new symptoms or if symptoms persist or worsen. Patient is aware when to return to the clinic for a follow-up visit. Patient educated on when it is appropriate to go to the emergency department.  Return if symptoms worsen or fail to improve.  Kathryn Mccarn [place] Thompson, DNP [facility] [address] [place], [place] [address] [phone]  Note: This document was prepared by [employer] voice dictation technology and any errors that results from this process are unintentional.

## 2024-02-03 ENCOUNTER — Telehealth (HOSPITAL_COMMUNITY): Payer: Self-pay

## 2024-02-03 LAB — BMP8+EGFR
BUN/Creatinine Ratio: 17 (ref 12–28)
BUN: 22 mg/dL (ref 8–27)
CO2: 22 mmol/L (ref 20–29)
Calcium: 9.5 mg/dL (ref 8.7–10.3)
Chloride: 103 mmol/L (ref 96–106)
Creatinine, Ser: 1.26 mg/dL — ABNORMAL HIGH (ref 0.57–1.00)
Glucose: 312 mg/dL — ABNORMAL HIGH (ref 70–99)
Potassium: 3.3 mmol/L — ABNORMAL LOW (ref 3.5–5.2)
Sodium: 142 mmol/L (ref 134–144)
eGFR: 49 mL/min/{1.73_m2} — ABNORMAL LOW (ref >59)

## 2024-02-03 LAB — MAGNESIUM: Magnesium: 2 mg/dL (ref 1.6–2.3)

## 2024-02-03 NOTE — Telephone Encounter (Signed)
 Initiated prior authorization to [employer] for [employer] admission.  Date of service: 03/12/2024 Faxed clinical information. Pending authorization # [id number] Fax # [phone]

## 2024-02-07 ENCOUNTER — Ambulatory Visit: Payer: Self-pay | Admitting: Nurse Practitioner

## 2024-02-07 ENCOUNTER — Other Ambulatory Visit: Payer: Self-pay | Admitting: Nurse Practitioner

## 2024-02-07 ENCOUNTER — Telehealth (HOSPITAL_COMMUNITY): Payer: Self-pay

## 2024-02-07 DIAGNOSIS — E876 Hypokalemia: Secondary | ICD-10-CM

## 2024-02-07 NOTE — Telephone Encounter (Signed)
 [employer] admission approve#[id number] for 03/12/24

## 2024-02-15 ENCOUNTER — Encounter: Payer: Self-pay | Admitting: Cardiology

## 2024-02-15 NOTE — Telephone Encounter (Signed)
 Spoke with patient. Day 1 of admission authorized. Subsequent days will process through authorization department while inpatient. Pt verbalized understanding.

## 2024-02-21 ENCOUNTER — Ambulatory Visit: Admitting: Nurse Practitioner

## 2024-02-29 ENCOUNTER — Other Ambulatory Visit: Payer: Self-pay

## 2024-02-29 ENCOUNTER — Ambulatory Visit
Admission: EM | Admit: 2024-02-29 | Discharge: 2024-02-29 | Disposition: A | Discharge status: Home / Self Care | Attending: Nurse Practitioner | Admitting: Nurse Practitioner

## 2024-02-29 ENCOUNTER — Encounter: Payer: Self-pay | Admitting: Emergency Medicine

## 2024-02-29 DIAGNOSIS — R109 Unspecified abdominal pain: Secondary | ICD-10-CM | POA: Diagnosis present

## 2024-02-29 DIAGNOSIS — R809 Proteinuria, unspecified: Secondary | ICD-10-CM | POA: Insufficient documentation

## 2024-02-29 DIAGNOSIS — R300 Dysuria: Secondary | ICD-10-CM | POA: Diagnosis present

## 2024-02-29 DIAGNOSIS — R319 Hematuria, unspecified: Secondary | ICD-10-CM | POA: Diagnosis not present

## 2024-02-29 DIAGNOSIS — R110 Nausea: Secondary | ICD-10-CM | POA: Diagnosis present

## 2024-02-29 LAB — POCT URINALYSIS DIP (MANUAL ENTRY)
Bilirubin, UA: NEGATIVE
Glucose, UA: >=1000 mg/dL — AB
Ketones, POC UA: NEGATIVE mg/dL
Leukocytes, UA: NEGATIVE
Nitrite, UA: NEGATIVE
Protein Ur, POC: 30 mg/dL — AB
Spec Grav, UA: 1.01 (ref 1.010–1.025)
Urobilinogen, UA: 0.2 U/dL
pH, UA: 5.5 (ref 5.0–8.0)

## 2024-02-29 MED ORDER — TAMSULOSIN HCL 0.4 MG PO CAPS: 0.4000 mg | ORAL_CAPSULE | Freq: Every day | ORAL | 0 refills | Status: DC

## 2024-02-29 NOTE — ED Provider Notes (Signed)
 [facility]    CSN: [id number] Arrival date & time: 02/29/24  0804      History   Chief Complaint Chief Complaint  Patient presents with   Flank Pain    HPI Kathryn Merritt is a 63 y.o. female.   The history is provided by the patient.   Patient presents with a 2-day history of right flank pain, pain with urination, suprapubic pressure, nausea, and diarrhea.  Symptoms have worsened over the past day or so.  Patient denies fever, chills, urinary frequency, urgency, hematuria, decreased urine stream, low back pain, or vaginal symptoms.  Patient denies history of recurrent UTI.  Patient has not taken any medication for her symptoms.  Patient with underlying history of kidney stones. Past Medical History:  Diagnosis Date   Chronic back pain    Diabetes mellitus    Edema leg    Hyperlipidemia    Hypertension    Joint pain    Kidney stones    Other malaise and fatigue    PAF (paroxysmal atrial fibrillation) (HCC)    a. new diagnosis in 02/2021 --> s/p DCCV on 03/18/2021   RBBB    Sinus bradycardia    Sleep apnea     Patient Active Problem List   Diagnosis Date Noted   Palpitations 02/02/2024   Diabetes mellitus treated with injections of non-insulin  medication (HCC) 08/01/2023   Viral URI 07/20/2023   Hypercoagulable state due to paroxysmal atrial fibrillation (HCC) 03/29/2023   Lesion of left ear 06/24/2022   Vitamin D  deficiency 01/21/2022   Stage 3a chronic kidney disease (HCC) 01/21/2022   Constipation due to outlet dysfunction 01/13/2022   Esophageal dysphagia 01/13/2022   Subclinical hypothyroidism 10/23/2021   Gastroesophageal reflux disease 07/16/2021   OSA (obstructive sleep apnea) 07/14/2021   Hypertension 06/18/2021   Hyperlipidemia associated with type 2 diabetes mellitus (HCC) 06/18/2021   Paroxysmal atrial fibrillation (HCC) 03/18/2021   Morbid obesity (HCC) 03/18/2021   Controlled type 2 diabetes mellitus without complication, without  long-term current use of insulin  (HCC) 03/18/2021    Past Surgical History:  Procedure Laterality Date   BALLOON DILATION N/A 02/22/2022   Procedure: BALLOON DILATION;  Surgeon: Vinetta Greening, DO;  Location: [facility];  Service: Endoscopy;  Laterality: N/A;   BIOPSY  02/22/2022   Procedure: BIOPSY;  Surgeon: Vinetta Greening, DO;  Location: [facility];  Service: Endoscopy;;   CARDIOVERSION N/A 03/18/2021   Procedure: CARDIOVERSION;  Surgeon: Gerard Knight, MD;  Location: [facility];  Service: Cardiovascular;  Laterality: N/A;   CESAREAN SECTION     times 2   COLONOSCOPY WITH PROPOFOL  N/A 02/22/2022   Procedure: COLONOSCOPY WITH PROPOFOL ;  Surgeon: Vinetta Greening, DO;  Location: [facility];  Service: Endoscopy;  Laterality: N/A;  7:30am   CORONARY ANGIOGRAPHY N/A 06/09/2022   Procedure: CORONARY ANGIOGRAPHY;  Surgeon: Kyra Phy, MD;  Location: [facility];  Service: Cardiovascular;  Laterality: N/A;   ESOPHAGOGASTRODUODENOSCOPY (EGD) WITH PROPOFOL  N/A 02/22/2022   Procedure: ESOPHAGOGASTRODUODENOSCOPY (EGD) WITH PROPOFOL ;  Surgeon: Vinetta Greening, DO;  Location: [facility];  Service: Endoscopy;  Laterality: N/A;   INCISION AND DRAINAGE ABSCESS Right    axilla   LITHOTRIPSY     POLYPECTOMY  02/22/2022   Procedure: POLYPECTOMY;  Surgeon: Vinetta Greening, DO;  Location: [facility];  Service: Endoscopy;;    OB History   No obstetric history on file.      Home Medications  Prior to Admission medications   Medication Sig Start Date End Date Taking? Authorizing Provider  lisinopril  (ZESTRIL ) 20 MG tablet Take 25 mg by mouth daily.   Yes [provider]  tamsulosin (FLOMAX) 0.4 MG CAPS capsule Take 1 capsule (0.4 mg total) by mouth daily after supper. 02/29/24  Yes Leath-Warren, Belen Bowers, NP  acetaminophen  (TYLENOL ) 500 MG tablet Take 1,000 mg by mouth every 8 (eight) hours as needed for moderate pain.    [provider]  albuterol   (VENTOLIN  HFA) 108 (90 Base) MCG/ACT inhaler Inhale 2 puffs into the lungs every 6 (six) hours as needed for wheezing or shortness of breath. 10/31/23   Gaylyn Keas, Mary-Margaret, FNP  apixaban  (ELIQUIS ) 5 MG TABS tablet Take 1 tablet (5 mg total) by mouth 2 (two) times daily. 09/19/23   Eilleen Grates, MD  blood glucose meter kit and supplies Dispense based on patient and insurance preference. Use up to four times daily as directed. (FOR ICD-10 E10.9, E11.9). 10/01/21   Wendel Hals, NP  carbamide peroxide (DEBROX) 6.5 % OTIC solution Place 5 drops into the left ear 2 (two) times daily. 09/30/23   Corbin Dess, PA-C  cetirizine  (ZYRTEC  ALLERGY) 10 MG tablet Take 1 tablet (10 mg total) by mouth daily. 07/20/23   Leath-Warren, Belen Bowers, NP  Cholecalciferol (VITAMIN D3) 50 MCG (2000 UT) capsule Take 1 capsule (2,000 Units total) by mouth daily. 09/27/22   Lorel Roes, NP  dapagliflozin  propanediol (FARXIGA ) 10 MG TABS tablet Take 1 tablet (10 mg total) by mouth daily before breakfast. 11/04/23   Gaylyn Keas, Mary-Margaret, FNP  DM-APAP-CPM (CORICIDIN HBP) 10-325-2 MG TABS Take 1 tablet by mouth 3 (three) times daily as needed. 09/30/23   Corbin Dess, PA-C  fluticasone  (FLONASE ) 50 MCG/ACT nasal spray Place 2 sprays into both nostrils daily. 07/20/23   St Annice Kim, NP  glipiZIDE  (GLUCOTROL ) 5 MG tablet Take 1 tablet (5 mg total) by mouth 2 (two) times daily before a meal. 11/04/23   Gaylyn Keas, Mary-Margaret, FNP  lubiprostone  (AMITIZA ) 24 MCG capsule Take 1 capsule (24 mcg total) by mouth 2 (two) times daily. 08/01/23   Gaylyn Keas Mary-Margaret, FNP  pantoprazole  (PROTONIX ) 40 MG tablet Take 1 tablet (40 mg total) by mouth daily. 11/04/23   Gaylyn Keas Mary-Margaret, FNP  potassium chloride  SA (KLOR-CON  M) 20 MEQ tablet Take 1 tablet (20 mEq total) by mouth 2 (two) times daily. 10/24/23   Alissa April, MD  rosuvastatin  (CRESTOR ) 10 MG tablet Take 1 tablet (10 mg total) by mouth daily.  08/01/23   Gaylyn Keas Mary-Margaret, FNP  torsemide  (DEMADEX ) 100 MG tablet Take 1 tablet (100 mg total) by mouth daily. 12/26/23   Delfina Feller, FNP    Family History Family History  Problem Relation Age of Onset   Hypertension Mother    Cancer Father    Hypertension Father    Cancer Brother    Heart disease Paternal Grandmother    Breast cancer Neg Hx    Colon cancer Neg Hx     Social History Social History   Tobacco Use   Smoking status: Never   Smokeless tobacco: Never  Vaping Use   Vaping status: Never Used  Substance Use Topics   Alcohol use: No   Drug use: No     Allergies   Metformin  and related   Review of Systems Review of Systems Per HPI  Physical Exam Triage Vital Signs ED Triage Vitals  Encounter Vitals Group     BP  02/29/24 0834 115/77     Systolic BP Percentile --      Diastolic BP Percentile --      Pulse Rate 02/29/24 0834 (!) 55     Resp 02/29/24 0834 20     Temp 02/29/24 0834 98.2 F (36.8 C)     Temp Source 02/29/24 0834 Oral     SpO2 02/29/24 0834 95 %     Weight --      Height --      Head Circumference --      Peak Flow --      Pain Score 02/29/24 0832 5     Pain Loc --      Pain Education --      Exclude from Growth Chart --    No data found.  Updated Vital Signs BP 115/77 (BP Location: Right Arm)   Pulse (!) 55   Temp 98.2 F (36.8 C) (Oral)   Resp 20   SpO2 95%   Visual Acuity Right Eye Distance:   Left Eye Distance:   Bilateral Distance:    Right Eye Near:   Left Eye Near:    Bilateral Near:     Physical Exam Vitals and nursing note reviewed.  Constitutional:      General: She is not in acute distress.    Appearance: Normal appearance.  HENT:     Head: Normocephalic.     Mouth/Throat:     Mouth: Mucous membranes are moist.  Eyes:     Extraocular Movements: Extraocular movements intact.     Conjunctiva/sclera: Conjunctivae normal.     Pupils: Pupils are equal, round, and reactive to light.   Cardiovascular:     Rate and Rhythm: Regular rhythm. Bradycardia present.     Pulses: Normal pulses.     Heart sounds: Normal heart sounds.  Pulmonary:     Effort: Pulmonary effort is normal. No respiratory distress.     Breath sounds: Normal breath sounds. No stridor. No wheezing, rhonchi or rales.  Abdominal:     General: Bowel sounds are normal.     Palpations: Abdomen is soft.     Tenderness: There is no abdominal tenderness. There is right CVA tenderness. There is no left CVA tenderness.  Musculoskeletal:     Cervical back: Normal range of motion.  Skin:    General: Skin is warm and dry.  Neurological:     General: No focal deficit present.     Mental Status: She is alert and oriented to person, place, and time.  Psychiatric:        Mood and Affect: Mood normal.        Behavior: Behavior normal.      UC Treatments / Results  Labs (all labs ordered are listed, but only abnormal results are displayed) Labs Reviewed  POCT URINALYSIS DIP (MANUAL ENTRY) - Abnormal; Notable for the following components:      Result Value   Glucose, UA >=1,000 (*)    Blood, UA trace-intact (*)    Protein Ur, POC =30 (*)    All other components within normal limits  URINE CULTURE    EKG   Radiology No results found.  Procedures Procedures (including critical care time)  Medications Ordered in UC Medications - No data to display  Initial Impression / Assessment and Plan / UC Course  I have reviewed the triage vital signs and the nursing notes.  Pertinent labs & imaging results that were available during my care of the  patient were reviewed by me and considered in my medical decision making (see chart for details).  Patient presents for complaints of right flank pain is been present for the past 2 days.  Patient reports symptoms have worsened since starting.  On exam, she does have right CVA tenderness.  Urinalysis does not indicate an obvious infection, she does have blood and  protein, findings are consistent with possible kidney stone versus UTI.  In the interim, will treat with tamsulosin 0.4 mg.  Supportive care recommendations were provided and discussed with the patient to include fluids, rest, over-the-counter analgesics, and to monitor for worsening.  Patient was given strict ER follow-up precautions.  Referral was placed for urology, patient advised she will need to call to schedule the appointment.  Patient was in agreement with this plan of care and verbalizes understanding, all questions were answered.  Patient stable for discharge.   Final Clinical Impressions(s) / UC Diagnoses   Final diagnoses:  Right flank pain     Discharge Instructions      A urine culture has been ordered.  You will be contacted when the results of the culture are received if they are abnormal.  You also have access to your results via MyChart. Take medication as prescribed. Continue to drink plenty of fluids.  Try to drink at least 8-10 8 ounce glasses of water daily. Go to the emergency department immediately if you experience worsening flank pain, fever, chills, or other concerns. Follow-up with your primary care physician as scheduled. As discussed, a referral for urology has been placed.  You will need to call to schedule the appointment. Follow-up as needed.   ED Prescriptions     Medication Sig Dispense Auth. Provider   tamsulosin (FLOMAX) 0.4 MG CAPS capsule Take 1 capsule (0.4 mg total) by mouth daily after supper. 30 capsule Leath-Warren, Belen Bowers, NP      PDMP not reviewed this encounter.   Hardy Lia, NP 02/29/24 [phone]

## 2024-02-29 NOTE — Discharge Instructions (Signed)
 A urine culture has been ordered.  You will be contacted when the results of the culture are received if they are abnormal.  You also have access to your results via MyChart. Take medication as prescribed. Continue to drink plenty of fluids.  Try to drink at least 8-10 8 ounce glasses of water daily. Go to the emergency department immediately if you experience worsening flank pain, fever, chills, or other concerns. Follow-up with your primary care physician as scheduled. As discussed, a referral for urology has been placed.  You will need to call to schedule the appointment. Follow-up as needed.

## 2024-02-29 NOTE — ED Triage Notes (Signed)
 Pt reports right flank pain since Monday. Pt reports intermittent dysuria and pressure with voiding. Also states nausea, diarrhea started this am.

## 2024-03-01 LAB — URINE CULTURE: Culture: >=100000 — AB

## 2024-03-02 ENCOUNTER — Ambulatory Visit (HOSPITAL_COMMUNITY): Payer: Self-pay

## 2024-03-02 MED ORDER — CEFPODOXIME PROXETIL 200 MG PO TABS: 200.0000 mg | ORAL_TABLET | Freq: Two times a day (BID) | ORAL | 0 refills | Status: DC

## 2024-03-08 ENCOUNTER — Encounter: Payer: Self-pay | Admitting: Nurse Practitioner

## 2024-03-08 ENCOUNTER — Ambulatory Visit: Admitting: Nurse Practitioner

## 2024-03-08 VITALS — BP 119/78 | HR 64 | Temp 98.1°F | Ht 63.0 in | Wt 252.0 lb

## 2024-03-08 DIAGNOSIS — I10 Essential (primary) hypertension: Secondary | ICD-10-CM

## 2024-03-08 DIAGNOSIS — E1169 Type 2 diabetes mellitus with other specified complication: Secondary | ICD-10-CM

## 2024-03-08 DIAGNOSIS — K5902 Outlet dysfunction constipation: Secondary | ICD-10-CM

## 2024-03-08 DIAGNOSIS — R300 Dysuria: Secondary | ICD-10-CM

## 2024-03-08 DIAGNOSIS — E119 Type 2 diabetes mellitus without complications: Secondary | ICD-10-CM | POA: Diagnosis not present

## 2024-03-08 DIAGNOSIS — K219 Gastro-esophageal reflux disease without esophagitis: Secondary | ICD-10-CM | POA: Diagnosis not present

## 2024-03-08 DIAGNOSIS — E785 Hyperlipidemia, unspecified: Secondary | ICD-10-CM

## 2024-03-08 DIAGNOSIS — I480 Paroxysmal atrial fibrillation: Secondary | ICD-10-CM

## 2024-03-08 LAB — BAYER DCA HB A1C WAIVED: HB A1C (BAYER DCA - WAIVED): 9.9 % — ABNORMAL HIGH (ref 4.8–5.6)

## 2024-03-08 LAB — LIPID PANEL

## 2024-03-08 MED ORDER — SITAGLIPTIN PHOSPHATE 100 MG PO TABS: 100.0000 mg | ORAL_TABLET | Freq: Every day | ORAL | 1 refills | Status: DC

## 2024-03-08 MED ORDER — ROSUVASTATIN CALCIUM 10 MG PO TABS: 10.0000 mg | ORAL_TABLET | Freq: Every day | ORAL | 1 refills | Status: DC

## 2024-03-08 MED ORDER — NITROFURANTOIN MONOHYD MACRO 100 MG PO CAPS: 100.0000 mg | ORAL_CAPSULE | Freq: Two times a day (BID) | ORAL | 0 refills | Status: DC

## 2024-03-08 MED ORDER — SULFAMETHOXAZOLE-TRIMETHOPRIM 800-160 MG PO TABS: 1.0000 | ORAL_TABLET | Freq: Two times a day (BID) | ORAL | 0 refills | Status: DC

## 2024-03-08 MED ORDER — APIXABAN 5 MG PO TABS
5.0000 mg | ORAL_TABLET | Freq: Two times a day (BID) | ORAL | 1 refills | Status: DC
Start: 2024-03-08 — End: 2024-06-05

## 2024-03-08 MED ORDER — POTASSIUM CHLORIDE CRYS ER 20 MEQ PO TBCR: 20.0000 meq | EXTENDED_RELEASE_TABLET | Freq: Two times a day (BID) | ORAL | 0 refills | Status: DC

## 2024-03-08 MED ORDER — LISINOPRIL 20 MG PO TABS
25.0000 mg | ORAL_TABLET | Freq: Every day | ORAL | 1 refills | Status: DC
Start: 2024-03-08 — End: 2024-06-05

## 2024-03-08 MED ORDER — GLIPIZIDE 5 MG PO TABS: 5.0000 mg | ORAL_TABLET | Freq: Two times a day (BID) | ORAL | 5 refills | Status: DC

## 2024-03-08 MED ORDER — PANTOPRAZOLE SODIUM 40 MG PO TBEC: 40.0000 mg | DELAYED_RELEASE_TABLET | Freq: Every day | ORAL | 1 refills | Status: DC

## 2024-03-08 MED ORDER — TORSEMIDE 100 MG PO TABS: 100.0000 mg | ORAL_TABLET | Freq: Every day | ORAL | 0 refills | Status: AC

## 2024-03-08 MED ORDER — LUBIPROSTONE 24 MCG PO CAPS: 24.0000 ug | ORAL_CAPSULE | Freq: Two times a day (BID) | ORAL | 0 refills | Status: DC

## 2024-03-08 NOTE — Patient Instructions (Signed)
 Sitagliptin Tablets What is this medication? SITAGLIPTIN (sit a GLIP tin) treats type 2 diabetes. It works by increasing insulin  levels in your body, which decreases your blood sugar (glucose). It also reduces the amount of sugar released into your blood. Changes to diet and exercise are often combined with this medication. This medicine may be used for other purposes; ask your health care provider or pharmacist if you have questions. COMMON BRAND NAME(S): Januvia, Zituvio What should I tell my care team before I take this medication? They need to know if you have any of these conditions: Diabetic ketoacidosis Kidney disease Pancreatitis Previous swelling of the tongue, face, or lips with trouble breathing, trouble swallowing, hoarseness, or tightening of the throat Type 1 diabetes An unusual or allergic reaction to sitagliptin, other medications, foods, dyes, or preservatives Pregnant or trying to get pregnant Breastfeeding How should I use this medication? Take this medication by mouth with water. Take it as directed on the prescription label at the same time every day. Do not cut, crush, or chew this medication. Swallow the tablets whole. You can take it with or without food. Do not take it more often than directed. Keep taking it unless your care team tells you to stop. A special MedGuide will be given to you by the pharmacist with each prescription and refill. Be sure to read this information carefully each time. Talk to your care team about the use of this medication in children. It is not approved for use in children. Overdosage: If you think you have taken too much of this medicine contact a poison control center or emergency room at once. NOTE: This medicine is only for you. Do not share this medicine with others. What if I miss a dose? If you miss a dose, take it as soon as you can. If it is almost time for your next dose, take only that dose. Do not take double or extra doses. What  may interact with this medication? Some medications may affect your blood sugar levels or hide the symptoms of low blood sugar (hypoglycemia). Talk with your care team about all the medications you take. They may suggest checking your blood sugar levels more often. Medications that may affect your blood sugar levels include: Alcohol Certain antibiotics, such as ciprofloxacin, levofloxacin, sulfamethoxazole ; trimethoprim  Certain medications for blood pressure or heart disease, such as benazepril, enalapril, lisinopril , losartan, valsartan Certain medications for mental health conditions, such as fluoxetine or olanzapine Diuretics, such as hydrochlorothiazide  (HCTZ) Estrogen and progestin hormones Other medications for diabetes Steroid medications, such as prednisone  or cortisone Testosterone Thyroid  hormones Medications that may mask symptoms of low blood sugar include: Beta-blockers, such as atenolol, metoprolol , propranolol Clonidine Guanethidine Reserpine This list may not describe all possible interactions. Give your health care provider a list of all the medicines, herbs, non-prescription drugs, or dietary supplements you use. Also tell them if you smoke, drink alcohol, or use illegal drugs. Some items may interact with your medicine. What should I watch for while using this medication? Visit your care team for regular checks on your progress. A test called the HbA1C (A1C) will be monitored. This is a simple blood test. It measures your blood sugar control over the last 2 to 3 months. You will receive this test every 3 to 6 months. Learn how to check your blood sugar. Learn the symptoms of low and high blood sugar and how to manage them. Always carry a quick-source of sugar with you in case you have symptoms  of low blood sugar. Examples include hard sugar candy or glucose tablets. Make sure others know that you can choke if you eat or drink when you develop serious symptoms of low blood  sugar, such as seizures or unconsciousness. They must get medical help at once. Tell your care team if you have high blood sugar. You might need to change the dose of your medication. If you are sick or exercising more than usual, you might need to change the dose of your medication. Do not skip meals. Ask your care team if you should avoid alcohol. Many nonprescription cough and cold products contain sugar or alcohol. These can affect blood sugar. Wear a medical ID bracelet or chain, and carry a card that describes your disease and details of your medication and dosage times. What side effects may I notice from receiving this medication? Side effects that you should report to your care team as soon as possible: Allergic reactions--skin rash, itching, hives, swelling of the face, lips, tongue, or throat Heart failure--shortness of breath, swelling of the ankles, feet, or hands, sudden weight gain, unusual weakness or fatigue Kidney injury--decrease in the amount of urine, swelling of the ankles, hands, or feet Pancreatitis--severe stomach pain that spreads to your back or gets worse after eating or when touched, fever, nausea, vomiting Redness, blistering, peeling or loosening of the skin, including inside the mouth Severe joint pain Side effects that usually do not require medical attention (report to your care team if they continue or are bothersome): Headache Runny or stuffy nose Sore throat This list may not describe all possible side effects. Call your doctor for medical advice about side effects. You may report side effects to FDA at 1-800-FDA-1088. Where should I keep my medication? Keep out of the reach of children and pets. Store at room temperature between 20 and 25 degrees C (68 and 77 degrees F). See product label for storage information. Each product may have different instructions. Get rid of any unused medication as instructed or after the expiration date, whichever is first. To  get rid of medications that are no longer needed or have expired: Take the medication to a medication take-back program. Check with your pharmacy or law enforcement to find a location. If you cannot return the medication, check the label or package insert to see if the medication should be thrown out in the garbage or flushed down the toilet. If you are not sure, ask your care team. If it is safe to put it in the trash, empty the medication out of the container. Mix the medication with cat litter, dirt, coffee grounds, or other unwanted substance. Seal the mixture in a bag or container. Put it in the trash. NOTE: This sheet is a summary. It may not cover all possible information. If you have questions about this medicine, talk to your doctor, pharmacist, or health care provider.  2025 Elsevier/Gold Standard (2023-10-06 00:00:00)

## 2024-03-08 NOTE — Progress Notes (Signed)
 Subjective:    Patient ID: Kathryn Merritt, female    DOB: 1960-10-29, 63 y.o.   MRN: [id number]   Chief Complaint: medical management of chronic issues     HPI:  Kathryn Merritt is a 63 y.o. who identifies as a female who was assigned female at birth.   Social history: Lives with: by herself Work history: retired   Water engineer in today for follow up of the following chronic medical issues:  1. Primary hypertension No c/o chest pain, sob or headache. Does not check blood pressure at home. BP Readings from Last 3 Encounters:  03/08/24 119/78  02/29/24 115/77  02/02/24 136/68      2. Paroxysmal atrial fibrillation (HCC) No c/o palpitations or heart racing.  Farxiga  was stopped due to being put on tikosyn needs something different  3. Hyperlipidemia associated with type 2 diabetes mellitus (HCC) Does not watch diet and does no dedicated exercise Lab Results  Component Value Date   CHOL 133 12/27/2023   HDL 46 12/27/2023   LDLCALC 63 12/27/2023   TRIG 136 12/27/2023   CHOLHDL 2.9 12/27/2023     4. Controlled type 2 diabetes mellitus without complication, without long-term current use of insulin  (HCC) 5. Diabetes mellitus treated with injections of non-insulin  medication (HCC) Fasting blood sugars re running around 200. Patient was started on ozempic  at last visit. Lab Results  Component Value Date   HGBA1C 9.2 (H) 11/04/2023     6. Gastroesophageal reflux disease, unspecified whether esophagitis present Is on omeprazole  daily and is doing well.  7. Esophageal dysphagia No recent issues  8. OSA (obstructive sleep apnea) She doe snot wear her cpap  9. Subclinical hypothyroidism No issues that she is aware of. Lab Results  Component Value Date   TSH 3.470 12/27/2023     10. Vitamin D  deficiency Is on daily vitamin d  supplement Last vitamin D  Lab Results  Component Value Date   VD25OH 18.6 (L) 09/24/2022     11. Stage 3a chronic kidney disease  (HCC) No voiding issues and no edema Lab Results  Component Value Date   CREATININE 1.26 (H) 02/02/2024     12. Morbid obesity (HCC) Weight is down 7 lbs  Wt Readings from Last 3 Encounters:  03/08/24 252 lb (114.3 kg)  02/02/24 253 lb 6.4 oz (114.9 kg)  01/03/24 251 lb (113.9 kg)   BMI Readings from Last 3 Encounters:  03/08/24 44.64 kg/m  02/02/24 44.89 kg/m  01/03/24 44.46 kg/m      New complaints: Treated for UTI from urgent care- was  only given 7 tablets instead of 14. The prescription was sent in wrong. Has ran out and still has symptoms. Patient is going into hospital for tikosyn Monday  so needs med that will not interact.  Allergies  Allergen Reactions   Metformin  And Related     Made patient feel bad/GI   Outpatient Encounter Medications as of 03/08/2024  Medication Sig   acetaminophen  (TYLENOL ) 500 MG tablet Take 1,000 mg by mouth every 8 (eight) hours as needed for moderate pain.   albuterol  (VENTOLIN  HFA) 108 (90 Base) MCG/ACT inhaler Inhale 2 puffs into the lungs every 6 (six) hours as needed for wheezing or shortness of breath.   apixaban  (ELIQUIS ) 5 MG TABS tablet Take 1 tablet (5 mg total) by mouth 2 (two) times daily.   blood glucose meter kit and supplies Dispense based on patient and insurance preference. Use up to four times daily as directed. (  FOR ICD-10 E10.9, E11.9).   carbamide peroxide (DEBROX) 6.5 % OTIC solution Place 5 drops into the left ear 2 (two) times daily.   cefpodoxime (VANTIN) 200 MG tablet Take 1 tablet (200 mg total) by mouth 2 (two) times daily.   cetirizine  (ZYRTEC  ALLERGY) 10 MG tablet Take 1 tablet (10 mg total) by mouth daily.   Cholecalciferol (VITAMIN D3) 50 MCG (2000 UT) capsule Take 1 capsule (2,000 Units total) by mouth daily.   dapagliflozin  propanediol (FARXIGA ) 10 MG TABS tablet Take 1 tablet (10 mg total) by mouth daily before breakfast.   DM-APAP-CPM (CORICIDIN HBP) 10-325-2 MG TABS Take 1 tablet by mouth 3 (three)  times daily as needed.   fluticasone  (FLONASE ) 50 MCG/ACT nasal spray Place 2 sprays into both nostrils daily.   glipiZIDE  (GLUCOTROL ) 5 MG tablet Take 1 tablet (5 mg total) by mouth 2 (two) times daily before a meal.   lisinopril  (ZESTRIL ) 20 MG tablet Take 25 mg by mouth daily.   lubiprostone  (AMITIZA ) 24 MCG capsule Take 1 capsule (24 mcg total) by mouth 2 (two) times daily.   pantoprazole  (PROTONIX ) 40 MG tablet Take 1 tablet (40 mg total) by mouth daily.   potassium chloride  SA (KLOR-CON  M) 20 MEQ tablet Take 1 tablet (20 mEq total) by mouth 2 (two) times daily.   rosuvastatin  (CRESTOR ) 10 MG tablet Take 1 tablet (10 mg total) by mouth daily.   tamsulosin  (FLOMAX ) 0.4 MG CAPS capsule Take 1 capsule (0.4 mg total) by mouth daily after supper.   torsemide  (DEMADEX ) 100 MG tablet Take 1 tablet (100 mg total) by mouth daily.   No facility-administered encounter medications on file as of 03/08/2024.    Past Surgical History:  Procedure Laterality Date   BALLOON DILATION N/A 02/22/2022   Procedure: BALLOON DILATION;  Surgeon: Vinetta Greening, DO;  Location: [facility];  Service: Endoscopy;  Laterality: N/A;   BIOPSY  02/22/2022   Procedure: BIOPSY;  Surgeon: Vinetta Greening, DO;  Location: [facility];  Service: Endoscopy;;   CARDIOVERSION N/A 03/18/2021   Procedure: CARDIOVERSION;  Surgeon: Gerard Knight, MD;  Location: [facility];  Service: Cardiovascular;  Laterality: N/A;   CESAREAN SECTION     times 2   COLONOSCOPY WITH PROPOFOL  N/A 02/22/2022   Procedure: COLONOSCOPY WITH PROPOFOL ;  Surgeon: Vinetta Greening, DO;  Location: [facility];  Service: Endoscopy;  Laterality: N/A;  7:30am   CORONARY ANGIOGRAPHY N/A 06/09/2022   Procedure: CORONARY ANGIOGRAPHY;  Surgeon: Kyra Phy, MD;  Location: [facility];  Service: Cardiovascular;  Laterality: N/A;   ESOPHAGOGASTRODUODENOSCOPY (EGD) WITH PROPOFOL  N/A 02/22/2022   Procedure: ESOPHAGOGASTRODUODENOSCOPY (EGD) WITH  PROPOFOL ;  Surgeon: Vinetta Greening, DO;  Location: [facility];  Service: Endoscopy;  Laterality: N/A;   INCISION AND DRAINAGE ABSCESS Right    axilla   LITHOTRIPSY     POLYPECTOMY  02/22/2022   Procedure: POLYPECTOMY;  Surgeon: Vinetta Greening, DO;  Location: [facility];  Service: Endoscopy;;    Family History  Problem Relation Age of Onset   Hypertension Mother    Cancer Father    Hypertension Father    Cancer Brother    Heart disease Paternal Grandmother    Breast cancer Neg Hx    Colon cancer Neg Hx       Controlled substance contract: n/a     Review of Systems  Constitutional:  Negative for diaphoresis.  Eyes:  Negative for pain.  Respiratory:  Negative for shortness of breath.   Cardiovascular:  Negative for chest pain, palpitations and leg swelling.  Gastrointestinal:  Negative for abdominal pain.  Endocrine: Negative for polydipsia.  Skin:  Negative for rash.  Neurological:  Negative for dizziness, weakness and headaches.  Hematological:  Does not bruise/bleed easily.  All other systems reviewed and are negative.      Objective:   Physical Exam Vitals and nursing note reviewed.  Constitutional:      General: She is not in acute distress.    Appearance: Normal appearance. She is well-developed.  HENT:     Head: Normocephalic.     Right Ear: Tympanic membrane normal.     Left Ear: Tympanic membrane normal.     Nose: Nose normal.     Mouth/Throat:     Mouth: Mucous membranes are moist.   Eyes:     Pupils: Pupils are equal, round, and reactive to light.   Neck:     Vascular: No carotid bruit or JVD.   Cardiovascular:     Rate and Rhythm: Normal rate and regular rhythm.     Heart sounds: Normal heart sounds.  Pulmonary:     Effort: Pulmonary effort is normal. No respiratory distress.     Breath sounds: Normal breath sounds. No wheezing or rales.  Chest:     Chest wall: No tenderness.  Abdominal:     General: Bowel sounds are normal.  There is no distension or abdominal bruit.     Palpations: Abdomen is soft. There is no hepatomegaly, splenomegaly, mass or pulsatile mass.     Tenderness: There is no abdominal tenderness.   Musculoskeletal:        General: Normal range of motion.     Cervical back: Normal range of motion and neck supple.  Lymphadenopathy:     Cervical: No cervical adenopathy.   Skin:    General: Skin is warm and dry.   Neurological:     Mental Status: She is alert and oriented to person, place, and time.     Deep Tendon Reflexes: Reflexes are normal and symmetric.   Psychiatric:        Behavior: Behavior normal.        Thought Content: Thought content normal.        Judgment: Judgment normal.    BP 119/78   Pulse 64   Temp 98.1 F (36.7 C)   Ht 5' 3 (1.6 m)   Wt 252 lb (114.3 kg)   SpO2 95%   BMI 44.64 kg/m    Hgba1c .3.9     Assessment & Plan:  Kathryn Merritt comes in today with chief complaint of Medical Management of Chronic Issues (Patient would like to talk about her Farxiga . Patient also wants urine checked. )   Diagnosis and orders addressed:  1. Primary hypertension Low sodium diet - CBC with Differential/Platelet - CMP14+EGFR - amLODipine  (NORVASC ) 5 MG tablet; Take 1 tablet (5 mg total) by mouth daily.  Dispense: 90 tablet; Refill: 1 - lisinopril  (ZESTRIL ) 20 MG tablet; Take 1 tablet (20 mg total) by mouth daily.  Dispense: 90 tablet; Refill: 1 - torsemide  (DEMADEX ) 100 MG tablet; Take 1 tablet (100 mg total) by mouth daily.  Dispense: 90 tablet; Refill: 1  2. Paroxysmal atrial fibrillation (HCC) Avoid caffeine Keep appt for tikosyn titration  3. Hyperlipidemia associated with type 2 diabetes mellitus (HCC) Low fat diet - Lipid panel - rosuvastatin  (CRESTOR ) 10 MG tablet; Take 1 tablet (10 mg total)  by mouth daily.  Dispense: 90 tablet; Refill: 1  4. Controlled type 2 diabetes mellitus without complication, without long-term current use of insulin   (HCC) Stricter carb counting Added metformin  to meds Continue to hold farxiga  Start  on januvia 100mg  daily - Bayer DCA Hb A1c Waived - metFORMIN  (GLUCOPHAGE ) 500 MG tablet; Take 1 tablet (500 mg total) by mouth 2 (two) times daily with a meal.  Dispense: 180 tablet; Refill: 3 - Januvia 100mg  1 po daily #90 1 refill  5. Diabetes mellitus treated with injections of non-insulin  medication (HCC) Insurance refused her mounjario  6. Gastroesophageal reflux disease, unspecified whether esophagitis present Avoid spicy foods Do not eat 2 hours prior to bedtime Stop omeprazole  Start protonix  daily - pantoprazole  (PROTONIX ) 40 MG tablet; Take 1 tablet (40 mg total) by mouth daily.  Dispense: 30 tablet; Refill: 3  7. Esophageal dysphagia Chew food well  8. OSA (obstructive sleep apnea) Needs to wear cpap  9. Subclinical hypothyroidism Labs pending  10. Vitamin D  deficiency On daily vitamin d  supplement  11. Stage 3a chronic kidney disease (HCC) Labs pending  12. Morbid obesity (HCC) Discussed diet and exercise for person with BMI >25 Will recheck weight in 3-6 months   13. Constipation due to outlet dysfunction May need to stop meds if metformin  causes looser stools. - lubiprostone  (AMITIZA ) 24 MCG capsule; Take 1 capsule (24 mcg total) by mouth 2 (two) times daily.  Dispense: 180 capsule; Refill: 0   14. Dysuria Will treat with macrobid Culture pending   Labs pending Health Maintenance reviewed Diet and exercise encouraged  Follow up plan: 3 months   Mary-Margaret Gaylyn Keas, FNP   HPI

## 2024-03-09 ENCOUNTER — Encounter (HOSPITAL_COMMUNITY): Payer: Self-pay

## 2024-03-09 ENCOUNTER — Ambulatory Visit: Payer: Self-pay | Admitting: Nurse Practitioner

## 2024-03-09 ENCOUNTER — Telehealth: Payer: Self-pay

## 2024-03-09 LAB — CBC WITH DIFFERENTIAL/PLATELET
Basophils Absolute: 0 10*3/uL (ref 0.0–0.2)
Basos: 1 %
EOS (ABSOLUTE): 0.2 10*3/uL (ref 0.0–0.4)
Eos: 2 %
Hematocrit: 44.1 % (ref 34.0–46.6)
Hemoglobin: 13.7 g/dL (ref 11.1–15.9)
Immature Grans (Abs): 0 10*3/uL (ref 0.0–0.1)
Immature Granulocytes: 0 %
Lymphocytes Absolute: 2.6 10*3/uL (ref 0.7–3.1)
Lymphs: 33 %
MCH: 29.1 pg (ref 26.6–33.0)
MCHC: 31.1 g/dL — ABNORMAL LOW (ref 31.5–35.7)
MCV: 94 fL (ref 79–97)
Monocytes Absolute: 0.5 10*3/uL (ref 0.1–0.9)
Monocytes: 7 %
Neutrophils Absolute: 4.4 10*3/uL (ref 1.4–7.0)
Neutrophils: 57 %
Platelets: 198 10*3/uL (ref 150–450)
RBC: 4.7 x10E6/uL (ref 3.77–5.28)
RDW: 13.5 % (ref 11.7–15.4)
WBC: 7.7 10*3/uL (ref 3.4–10.8)

## 2024-03-09 LAB — CMP14+EGFR
ALT: 14 IU/L (ref 0–32)
AST: 17 IU/L (ref 0–40)
Albumin: 4.1 g/dL (ref 3.9–4.9)
Alkaline Phosphatase: 77 IU/L (ref 44–121)
BUN/Creatinine Ratio: 20 (ref 12–28)
BUN: 25 mg/dL (ref 8–27)
Bilirubin Total: <0.2 mg/dL (ref 0.0–1.2)
CO2: 22 mmol/L (ref 20–29)
Calcium: 10.3 mg/dL (ref 8.7–10.3)
Chloride: 105 mmol/L (ref 96–106)
Creatinine, Ser: 1.25 mg/dL — ABNORMAL HIGH (ref 0.57–1.00)
Globulin, Total: 2.2 g/dL (ref 1.5–4.5)
Glucose: 231 mg/dL — ABNORMAL HIGH (ref 70–99)
Potassium: 3.7 mmol/L (ref 3.5–5.2)
Sodium: 143 mmol/L (ref 134–144)
Total Protein: 6.3 g/dL (ref 6.0–8.5)
eGFR: 49 mL/min/{1.73_m2} — ABNORMAL LOW (ref >59)

## 2024-03-09 LAB — URINE CULTURE

## 2024-03-09 LAB — LIPID PANEL
Chol/HDL Ratio: 2.9 ratio (ref 0.0–4.4)
Cholesterol, Total: 139 mg/dL (ref 100–199)
HDL: 48 mg/dL (ref >39)
LDL Chol Calc (NIH): 66 mg/dL (ref 0–99)
Triglycerides: 145 mg/dL (ref 0–149)
VLDL Cholesterol Cal: 25 mg/dL (ref 5–40)

## 2024-03-09 LAB — VITAMIN B12: Vitamin B-12: 359 pg/mL (ref 232–1245)

## 2024-03-09 MED ORDER — BASAGLAR KWIKPEN 100 UNIT/ML ~~LOC~~ SOPN: 20.0000 [IU] | PEN_INJECTOR | Freq: Every day | SUBCUTANEOUS | 3 refills | Status: DC

## 2024-03-09 NOTE — Telephone Encounter (Signed)
 [name] is to replace Kathryn Merritt

## 2024-03-09 NOTE — Telephone Encounter (Signed)
 Copied from CRM [id number]. Topic: Clinical - Medication Question >> Mar 09, 2024 12:17 PM Elle L wrote: Reason for CRM: The patient was given a new medication, Januvia, but she is unable to afford it. She messaged on MyChart regarding it but she is unclear if the Insulin  Glargine (BASAGLAR KWIKPEN) 100 UNIT/ML is supposed to replace the medication or if she is supposed to pick up both. The patient's call back number is [phone].

## 2024-03-09 NOTE — Telephone Encounter (Signed)
 Nothing else orally- will have to go on insulin . HGBA1c as 9.9 which has gone up. Start on lantus 20u daily- will send in prescription. Come by office with meds if need to be shown how to use.

## 2024-03-09 NOTE — Telephone Encounter (Signed)
Contacted patient. Notified patient. Patient verbalized understanding 

## 2024-03-12 ENCOUNTER — Encounter (HOSPITAL_COMMUNITY): Payer: Self-pay | Admitting: Physician Assistant

## 2024-03-12 ENCOUNTER — Other Ambulatory Visit: Payer: Self-pay

## 2024-03-12 ENCOUNTER — Inpatient Hospital Stay (HOSPITAL_COMMUNITY)
Admission: RE | Admit: 2024-03-12 | Discharge: 2024-03-15 | DRG: 309 | Disposition: A | Discharge status: Home / Self Care | Source: Ambulatory Visit | Attending: Cardiology | Admitting: Cardiology

## 2024-03-12 ENCOUNTER — Ambulatory Visit (HOSPITAL_COMMUNITY)
Admission: RE | Admit: 2024-03-12 | Discharge: 2024-03-12 | Disposition: A | Discharge status: Home / Self Care | Source: Ambulatory Visit | Attending: Physician Assistant | Admitting: Physician Assistant

## 2024-03-12 ENCOUNTER — Encounter (HOSPITAL_COMMUNITY): Payer: Self-pay | Admitting: Cardiology

## 2024-03-12 VITALS — BP 124/78 | HR 58 | Ht 63.0 in | Wt 253.8 lb

## 2024-03-12 DIAGNOSIS — I480 Paroxysmal atrial fibrillation: Secondary | ICD-10-CM | POA: Diagnosis not present

## 2024-03-12 DIAGNOSIS — I700 Atherosclerosis of aorta: Secondary | ICD-10-CM | POA: Diagnosis present

## 2024-03-12 DIAGNOSIS — K219 Gastro-esophageal reflux disease without esophagitis: Secondary | ICD-10-CM | POA: Diagnosis present

## 2024-03-12 DIAGNOSIS — Z888 Allergy status to other drugs, medicaments and biological substances status: Secondary | ICD-10-CM | POA: Diagnosis not present

## 2024-03-12 DIAGNOSIS — Z79899 Other long term (current) drug therapy: Secondary | ICD-10-CM

## 2024-03-12 DIAGNOSIS — E669 Obesity, unspecified: Secondary | ICD-10-CM

## 2024-03-12 DIAGNOSIS — G4733 Obstructive sleep apnea (adult) (pediatric): Secondary | ICD-10-CM

## 2024-03-12 DIAGNOSIS — I4819 Other persistent atrial fibrillation: Secondary | ICD-10-CM | POA: Diagnosis present

## 2024-03-12 DIAGNOSIS — I4510 Unspecified right bundle-branch block: Secondary | ICD-10-CM | POA: Diagnosis present

## 2024-03-12 DIAGNOSIS — Z7901 Long term (current) use of anticoagulants: Secondary | ICD-10-CM | POA: Diagnosis not present

## 2024-03-12 DIAGNOSIS — E785 Hyperlipidemia, unspecified: Secondary | ICD-10-CM | POA: Diagnosis present

## 2024-03-12 DIAGNOSIS — E1165 Type 2 diabetes mellitus with hyperglycemia: Secondary | ICD-10-CM | POA: Diagnosis not present

## 2024-03-12 DIAGNOSIS — E559 Vitamin D deficiency, unspecified: Secondary | ICD-10-CM

## 2024-03-12 DIAGNOSIS — D6869 Other thrombophilia: Secondary | ICD-10-CM | POA: Diagnosis present

## 2024-03-12 DIAGNOSIS — I10 Essential (primary) hypertension: Secondary | ICD-10-CM | POA: Diagnosis present

## 2024-03-12 LAB — BASIC METABOLIC PANEL WITH GFR
Anion gap: 7 (ref 5–15)
BUN/Creatinine Ratio: 26 (ref 12–28)
BUN: 29 mg/dL — ABNORMAL HIGH (ref 8–27)
BUN: 26 mg/dL — ABNORMAL HIGH (ref 8–23)
CO2: 23 mmol/L (ref 20–29)
CO2: 21 mmol/L — ABNORMAL LOW (ref 22–32)
Calcium: 9.9 mg/dL (ref 8.7–10.3)
Calcium: 9.6 mg/dL (ref 8.9–10.3)
Chloride: 109 mmol/L — ABNORMAL HIGH (ref 96–106)
Chloride: 111 mmol/L (ref 98–111)
Creatinine, Ser: 1.11 mg/dL — ABNORMAL HIGH (ref 0.57–1.00)
Creatinine, Ser: 1.13 mg/dL — ABNORMAL HIGH (ref 0.44–1.00)
GFR, Estimated: 55 mL/min — ABNORMAL LOW (ref >60)
Glucose, Bld: 349 mg/dL — ABNORMAL HIGH (ref 70–99)
Glucose: 195 mg/dL — ABNORMAL HIGH (ref 70–99)
Potassium: 3.4 mmol/L — ABNORMAL LOW (ref 3.5–5.2)
Potassium: 3.8 mmol/L (ref 3.5–5.1)
Sodium: 142 mmol/L (ref 134–144)
Sodium: 139 mmol/L (ref 135–145)
eGFR: 57 mL/min/{1.73_m2} — ABNORMAL LOW (ref >59)

## 2024-03-12 LAB — HIV ANTIBODY (ROUTINE TESTING W REFLEX): HIV Screen 4th Generation wRfx: NONREACTIVE

## 2024-03-12 LAB — MAGNESIUM: Magnesium: 2 mg/dL (ref 1.6–2.3)

## 2024-03-12 LAB — GLUCOSE, CAPILLARY: Glucose-Capillary: 286 mg/dL — ABNORMAL HIGH (ref 70–99)

## 2024-03-12 MED ORDER — SODIUM CHLORIDE 0.9% FLUSH: 3.0000 mL | INTRAVENOUS | Status: DC | PRN

## 2024-03-12 MED ORDER — ACETAMINOPHEN 500 MG PO TABS
1000.0000 mg | ORAL_TABLET | Freq: Three times a day (TID) | ORAL | Status: DC | PRN
  Administered 2024-03-13 – 2024-03-15 (×4): 1000 mg via ORAL
  Filled 2024-03-12 (×4): qty 2

## 2024-03-12 MED ORDER — PANTOPRAZOLE SODIUM 40 MG PO TBEC
40.0000 mg | DELAYED_RELEASE_TABLET | Freq: Every day | ORAL | Status: DC
  Administered 2024-03-13 – 2024-03-15 (×3): 40 mg via ORAL
  Filled 2024-03-12 (×3): qty 1

## 2024-03-12 MED ORDER — POTASSIUM CHLORIDE 20 MEQ PO PACK: 60.0000 meq | PACK | Freq: Once | ORAL | Status: DC

## 2024-03-12 MED ORDER — BASAGLAR KWIKPEN 100 UNIT/ML ~~LOC~~ SOPN: 20.0000 [IU] | PEN_INJECTOR | Freq: Every day | SUBCUTANEOUS | Status: DC

## 2024-03-12 MED ORDER — TORSEMIDE 20 MG PO TABS
100.0000 mg | ORAL_TABLET | Freq: Every day | ORAL | Status: DC
  Administered 2024-03-13 – 2024-03-15 (×3): 100 mg via ORAL
  Filled 2024-03-12 (×3): qty 5

## 2024-03-12 MED ORDER — POTASSIUM CHLORIDE CRYS ER 20 MEQ PO TBCR: 40.0000 meq | EXTENDED_RELEASE_TABLET | Freq: Once | ORAL | Status: DC

## 2024-03-12 MED ORDER — INSULIN GLARGINE-YFGN 100 UNIT/ML ~~LOC~~ SOLN
20.0000 [IU] | Freq: Every day | SUBCUTANEOUS | Status: DC
  Administered 2024-03-13 – 2024-03-15 (×3): 20 [IU] via SUBCUTANEOUS
  Filled 2024-03-12 (×3): qty 0.2

## 2024-03-12 MED ORDER — ALBUTEROL SULFATE (2.5 MG/3ML) 0.083% IN NEBU: 3.0000 mL | INHALATION_SOLUTION | Freq: Four times a day (QID) | RESPIRATORY_TRACT | Status: DC | PRN

## 2024-03-12 MED ORDER — SODIUM CHLORIDE 0.9% FLUSH
3.0000 mL | Freq: Two times a day (BID) | INTRAVENOUS | Status: DC
  Administered 2024-03-12 – 2024-03-14 (×5): 3 mL via INTRAVENOUS

## 2024-03-12 MED ORDER — POTASSIUM CHLORIDE CRYS ER 20 MEQ PO TBCR
60.0000 meq | EXTENDED_RELEASE_TABLET | Freq: Once | ORAL | Status: AC
  Administered 2024-03-12: 60 meq via ORAL
  Filled 2024-03-12: qty 3

## 2024-03-12 MED ORDER — PEN NEEDLES 31G X 5 MM MISC: 1.0000 | Freq: Four times a day (QID) | 3 refills | Status: AC

## 2024-03-12 MED ORDER — GLIPIZIDE 5 MG PO TABS
5.0000 mg | ORAL_TABLET | Freq: Two times a day (BID) | ORAL | Status: DC
  Administered 2024-03-13 – 2024-03-15 (×5): 5 mg via ORAL
  Filled 2024-03-12 (×7): qty 1

## 2024-03-12 MED ORDER — POTASSIUM CHLORIDE CRYS ER 20 MEQ PO TBCR: 40.0000 meq | EXTENDED_RELEASE_TABLET | ORAL | Status: DC

## 2024-03-12 MED ORDER — APIXABAN 5 MG PO TABS
5.0000 mg | ORAL_TABLET | Freq: Two times a day (BID) | ORAL | Status: DC
  Administered 2024-03-12 – 2024-03-15 (×6): 5 mg via ORAL
  Filled 2024-03-12 (×5): qty 1
  Filled 2024-03-12: qty 2

## 2024-03-12 MED ORDER — LISINOPRIL 20 MG PO TABS
20.0000 mg | ORAL_TABLET | Freq: Every day | ORAL | Status: DC
  Administered 2024-03-13 – 2024-03-15 (×3): 20 mg via ORAL
  Filled 2024-03-12 (×3): qty 1

## 2024-03-12 MED ORDER — SODIUM CHLORIDE 0.9 % IV SOLN: 250.0000 mL | INTRAVENOUS | Status: AC | PRN

## 2024-03-12 MED ORDER — DOFETILIDE 500 MCG PO CAPS
500.0000 ug | ORAL_CAPSULE | Freq: Two times a day (BID) | ORAL | Status: DC
  Administered 2024-03-12 – 2024-03-15 (×6): 500 ug via ORAL
  Filled 2024-03-12 (×6): qty 1

## 2024-03-12 MED ORDER — ROSUVASTATIN CALCIUM 5 MG PO TABS
10.0000 mg | ORAL_TABLET | Freq: Every day | ORAL | Status: DC
  Administered 2024-03-13 – 2024-03-15 (×3): 10 mg via ORAL
  Filled 2024-03-12 (×3): qty 2

## 2024-03-12 MED ORDER — LUBIPROSTONE 24 MCG PO CAPS
24.0000 ug | ORAL_CAPSULE | Freq: Two times a day (BID) | ORAL | Status: DC
  Administered 2024-03-12 – 2024-03-15 (×6): 24 ug via ORAL
  Filled 2024-03-12 (×7): qty 1

## 2024-03-12 MED ORDER — MAGNESIUM SULFATE 2 GM/50ML IV SOLN
2.0000 g | Freq: Once | INTRAVENOUS | Status: AC
  Administered 2024-03-12: 2 g via INTRAVENOUS
  Filled 2024-03-12: qty 50

## 2024-03-12 MED ORDER — VITAMIN D 25 MCG (1000 UNIT) PO TABS
2000.0000 [IU] | ORAL_TABLET | Freq: Every day | ORAL | Status: DC
  Administered 2024-03-13 – 2024-03-15 (×3): 2000 [IU] via ORAL
  Filled 2024-03-12 (×3): qty 2

## 2024-03-12 NOTE — TOC CM/SW Note (Signed)
 Transition of Care [facility]) - Inpatient Brief Assessment   Patient Details  Name: Kathryn Merritt MRN: [id number] Date of Birth: [birth year]  [facility] CM/SW Contact:    Lauraine FORBES Saa, LCSW Phone Number: 03/12/2024, 4:57 PM   Clinical Narrative:  4:57 PM Per chart review, patient resides at home with spouse. Patient has a PCP and insurance. Patient does not have SNF or HH history. Patient has CPAP through [employer]. Patient's preferred pharmacy is [facility] [place]. No TOC needs were identified at this time. TOC will continue to follow and be available to assist.  Transition of Care Asessment: Insurance and Status: Insurance coverage has been reviewed Patient has primary care physician: Yes Home environment has been reviewed: Private Residence Prior level of function:: N/A Prior/Current Home Services: No current home services Social Drivers of Health Review: SDOH reviewed no interventions necessary Readmission risk has been reviewed: Yes Transition of care needs: no transition of care needs at this time

## 2024-03-12 NOTE — Progress Notes (Signed)
 Primary Care Physician: [name], FNP Primary Cardiologist: Lynwood Schilling, MD Electrophysiologist: Fonda Kitty, MD  Referring Physician: [facility]    Verneita LITTIE Kathryn Merritt is a 63 y.o. female with a history of T2DM, moderate OSA, HLD, aortic atherosclerosis on CT, GERD, RBBB, HTN, and atrial fibrillation who presents for follow up in the [facility]. The patient was initially diagnosed with atrial fibrillation in 2022. Recently seen in ED on 03/22/23 due to Afib with RVR and underwent successful DCCV. Patient is on Eliquis  5 mg BID for stroke prevention. She had recurrence of her afib and was seen at the ED again 11/23/23 and underwent DCCV. She was seen by Dr Kitty who recommended dofetilide.   Patient returns for follow up for atrial fibrillation and dofetilide loading. She remains in SR today. No bleeding issues on anticoagulation. She has been consistent using her CPAP.   Today, she  denies symptoms of palpitations, chest pain, shortness of breath, orthopnea, PND, lower extremity edema, dizziness, presyncope, syncope, bleeding, or neurologic sequela. The patient is tolerating medications without difficulties and is otherwise without complaint today.    Atrial Fibrillation Risk Factors:  she does have diagnosis of sleep apnea.   she has a BMI of Body mass index is 44.96 kg/m.SABRA Filed Weights   03/12/24 1001  Weight: 115.1 kg     Current Outpatient Medications  Medication Sig Dispense Refill   acetaminophen  (TYLENOL ) 500 MG tablet Take 1,000 mg by mouth every 8 (eight) hours as needed for moderate pain.     albuterol  (VENTOLIN  HFA) 108 (90 Base) MCG/ACT inhaler Inhale 2 puffs into the lungs every 6 (six) hours as needed for wheezing or shortness of breath. 8.5 g 0   apixaban  (ELIQUIS ) 5 MG TABS tablet Take 1 tablet (5 mg total) by mouth 2 (two) times daily. 180 tablet 1   blood glucose meter kit and supplies Dispense based on patient and  insurance preference. Use up to four times daily as directed. (FOR ICD-10 E10.9, E11.9). 1 each 0   cetirizine  (ZYRTEC  ALLERGY) 10 MG tablet Take 1 tablet (10 mg total) by mouth daily. 30 tablet 0   glipiZIDE  (GLUCOTROL ) 5 MG tablet Take 1 tablet (5 mg total) by mouth 2 (two) times daily before a meal. 60 tablet 5   Insulin  Glargine (BASAGLAR  KWIKPEN) 100 UNIT/ML Inject 20 Units into the skin daily. 15 mL 3   Insulin  Pen Needle (PEN NEEDLES) 31G X 5 MM MISC 1 Needle by Does not apply route in the morning, at noon, in the evening, and at bedtime. 100 each 3   lisinopril  (ZESTRIL ) 20 MG tablet Take 1.5 tablets (30 mg total) by mouth daily. (Patient taking differently: Take 20 mg by mouth daily.) 90 tablet 1   lubiprostone  (AMITIZA ) 24 MCG capsule Take 1 capsule (24 mcg total) by mouth 2 (two) times daily. 180 capsule 0   pantoprazole  (PROTONIX ) 40 MG tablet Take 1 tablet (40 mg total) by mouth daily. 90 tablet 1   rosuvastatin  (CRESTOR ) 10 MG tablet Take 1 tablet (10 mg total) by mouth daily. 90 tablet 1   torsemide  (DEMADEX ) 100 MG tablet Take 1 tablet (100 mg total) by mouth daily. 90 tablet 0   carbamide peroxide (DEBROX) 6.5 % OTIC solution Place 5 drops into the left ear 2 (two) times daily. (Patient not taking: Reported on 03/12/2024) 15 mL 0   cefpodoxime  (VANTIN ) 200 MG tablet Take 1 tablet (200 mg total) by mouth 2 (  two) times daily. 7 tablet 0   Cholecalciferol (VITAMIN D3) 50 MCG (2000 UT) capsule Take 1 capsule (2,000 Units total) by mouth daily. 90 capsule 1   DM-APAP-CPM (CORICIDIN HBP) 10-325-2 MG TABS Take 1 tablet by mouth 3 (three) times daily as needed. 21 tablet 0   fluticasone  (FLONASE ) 50 MCG/ACT nasal spray Place 2 sprays into both nostrils daily. 16 g 1   nitrofurantoin , macrocrystal-monohydrate, (MACROBID ) 100 MG capsule Take 1 capsule (100 mg total) by mouth 2 (two) times daily. 1 po BId 14 capsule 0   potassium chloride  SA (KLOR-CON  M) 20 MEQ tablet Take 1 tablet (20 mEq  total) by mouth 2 (two) times daily. 10 tablet 0   tamsulosin  (FLOMAX ) 0.4 MG CAPS capsule Take 1 capsule (0.4 mg total) by mouth daily after supper. 30 capsule 0   No current facility-administered medications for this encounter.    Atrial Fibrillation Management history:  Previous antiarrhythmic drugs: None Previous cardioversions: 03/18/21, 11/20/2021, 03/22/23, 11/23/23 Previous ablations: None Anticoagulation history: Eliquis  5 mg BID   ROS- All systems are reviewed and negative except as per the HPI above.  Physical Exam: BP 124/78   Pulse (!) 58   Ht 5' 3 (1.6 m)   Wt 115.1 kg   BMI 44.96 kg/m   GEN: Well nourished, well developed in no acute distress CARDIAC: Regular rate and rhythm, no murmurs, rubs, gallops RESPIRATORY:  Clear to auscultation without rales, wheezing or rhonchi  ABDOMEN: Soft, non-tender, non-distended EXTREMITIES:  No edema; No deformity    EKG today demonstrates  SB, RBBB Vent. rate 58 BPM PR interval 120 ms QRS duration 128 ms QT/QTcB 442/433 ms   Echo 06/08/22 demonstrated   1. Left ventricular ejection fraction, by estimation, is 65 to 70%. The  left ventricle has normal function. The left ventricle has no regional  wall motion abnormalities. There is mild left ventricular hypertrophy.  Left ventricular diastolic parameters  are consistent with Grade I diastolic dysfunction (impaired relaxation).  Elevated left atrial pressure.   2. Right ventricular systolic function is normal. The right ventricular  size is normal. There is normal pulmonary artery systolic pressure.   3. Left atrial size was moderately dilated.   4. Right atrial size was mild to moderately dilated.   5. The mitral valve is normal in structure. No evidence of mitral valve  regurgitation. No evidence of mitral stenosis.   6. The tricuspid valve is abnormal.   7. The aortic valve is tricuspid. Aortic valve regurgitation is not  visualized. No aortic stenosis is present.    8. The inferior vena cava is normal in size with greater than 50%  respiratory variability, suggesting right atrial pressure of 3 mmHg.    CHA2DS2-VASc Score = 4  The patient's score is based upon: CHF History: 0 HTN History: 1 Diabetes History: 1 Stroke History: 0 Vascular Disease History: 1 Age Score: 0 Gender Score: 1       ASSESSMENT AND PLAN: Paroxysmal Atrial Fibrillation (ICD10:  I48.0) The patient's CHA2DS2-VASc score is 4, indicating a 4.8% annual risk of stroke.   Patient in SR today. Patient presents for dofetilide admission Continue Eliquis  5 mg BID, states no missed doses in the last 3 weeks. No recent benadryl use PharmD has screened medications for QT prolonging agents.  QTc in SR 433 ms Labs today show creatinine at 1.11, K+ 3.4 and mag 2.0, CrCl calculated at 96 mL/min  Secondary Hypercoagulable State (ICD10:  D68.69) The patient is at significant  risk for stroke/thromboembolism based upon her CHA2DS2-VASc Score of 4.  Continue Apixaban  (Eliquis ). No bleeding issues.   OSA  Encouraged nightly CPAP  Obesity Body mass index is 44.96 kg/m.  Encouraged lifestyle modification  HTN Stable on current regimen    To be admitted later today once a bed becomes available.     Daril Kicks PA-C Afib Clinic [facility] [address] [place], [place] [address] [phone]

## 2024-03-12 NOTE — Progress Notes (Addendum)
 Pharmacy: Dofetilide (Tikosyn) - Initial Consult Assessment and Electrolyte Replacement  Pharmacy consulted to assist in monitoring and replacing electrolytes in this 63 y.o. female admitted on 03/12/2024 undergoing dofetilide initiation. First dofetilide dose: 500 mcg   Assessment:  Patient Exclusion Criteria: If any screening criteria checked as Yes, then  patient  should NOT receive dofetilide until criteria item is corrected.  If "Yes" please indicate correction plan.  YES  NO Patient  Exclusion Criteria Correction Plan/Comments   []   [x]   Baseline QTc interval is greater than or equal to 440 msec. IF above YES box checked dofetilide contraindicated unless patient has ICD; then may proceed if QTc 500-550 msec or with known ventricular conduction abnormalities may proceed with QTc 550-600 msec. QTc = 0.48 0.433 per EP    []   [x]   Patient is known or suspected to have a digoxin level greater than 2 ng/ml: No results found for: DIGOXIN     []   [x]   Creatinine clearance less than 20 ml/min (calculated using Cockcroft-Gault, actual body weight and serum creatinine): Estimated Creatinine Clearance: 65.1 mL/min (A) (by C-G formula based on SCr of 1.11 mg/dL (H)).     []   [x]  Patient has received drugs known to prolong the QT intervals within the last 48 hour (examples: phenothiazines, tricyclics or tetracyclic antidepressants, macrolides, 1st generation H-1 antihistamines (especially diphenhydramine), fluoroquinolones, azoles, ondansetron , metoclopramide, promethazine ).   Updated information on QT prolonging agents is available to be searched on the following database:QT prolonging agents -If SSRI or antihistamine needed, preferred options are sertraline and loratadine respectively     []   [x]  Patient received a dose of a thiazide diuretic in the last 48 hours [including hydrochlorothiazide  (Oretic ) alone or in any combination including triamterene (Dyazide, Maxzide)].    []    [x]  Patient received a medication known to increase dofetilide plasma concentrations prior to initial dofetilide dose:  Trimethoprim  (Primsol, Proloprim ) in the last 36 hours Verapamil  (Calan , Verelan ) in the last 36 hours or a sustained release dose in the last 72 hours Megestrol (Megace) in the last 5 days  Cimetidine (Tagamet) in the last 6 hours Ketoconazole (Nizoral) in the last 24 hours Itraconazole (Sporanox) in the last 48 hours  Prochlorperazine (Compazine) in the last 36 hours     []   [x]   Patient is known to have a history of torsades de pointes; congenital or acquired long QT syndromes.    []   [x]   Patient has received a Class 1 and Class 3 antiarrhythmic with less than 2 half-lives since last dose. (Disopyramide, Quinidine, Procainamide, Lidocaine , Mexiletine, Flecainide, Propafenone, Sotalol, Dronedarone)    []   [x]   Patient has received amiodarone therapy in the past 3 months or amiodarone level is greater than 0.3 ng/ml.    Labs:    Component Value Date/Time   K 3.4 (L) 03/12/2024 0930   MG 2.0 03/12/2024 0930     Plan: Select One Calculated CrCl  Dose q12h  [x]  > 60 ml/min 500 mcg  []  40-60 ml/min 250 mcg  []  20-40 ml/min 125 mcg   [x]   Physician selected initial dose within range recommended for patients level of renal function - will monitor for response.  []   Physician selected initial dose outside of range recommended for patients level of renal function - will discuss if the dose should be altered at this time.   Patient has been appropriately anticoagulated with Eliquis .  Potassium: Per d/w EP - will give KCl 60mEq x 1 now and recheck  K+ at 19:00 -- recheck K+ = 3.8 - will give KCl 40mEq x 1 and begin Tikosyn 2hr after  Magnesium: Mg 1.8-2: Give Mg 2 gm IV x1 to prevent Mg from dropping below 1.8 - do not need to recheck Mg. Appropriate to initiate Tikosyn   Thank you for allowing pharmacy to participate in this patient's care   Rocky Slade, PharmD, BCPS 03/12/2024  4:44 PM   ADDENDUM: Per Dr. Kennyth and Daphne Barrack, K+ lab was drawn shortly after initially 60mEq KCl dose was given. No need for additional 40mEq order tonight.  Rocky Slade, PharmD, BCPS 03/12/2024 8:12 PM

## 2024-03-12 NOTE — H&P (Addendum)
 Primary Care Physician: [name], FNP Primary Cardiologist: Lynwood Schilling, MD Electrophysiologist: Fonda Kitty, MD  Referring Physician: [facility] ED    Kathryn Merritt is a 63 y.o. female with a history of T2DM, moderate OSA, HLD, aortic atherosclerosis on CT, GERD, RBBB, HTN, and atrial fibrillation who presents for follow up in the [facility]. The patient was initially diagnosed with atrial fibrillation in 2022. Recently seen in ED on 03/22/23 due to Afib with RVR and underwent successful DCCV. Patient is on Eliquis  5 mg BID for stroke prevention. She had recurrence of her afib and was seen at the ED again 11/23/23 and underwent DCCV. She was seen by Dr Kitty who recommended dofetilide.   Patient returns for follow up for atrial fibrillation and dofetilide loading. She remains in SR today. No bleeding issues on anticoagulation. She has been consistent using her CPAP.   Today, she  denies symptoms of palpitations, chest pain, shortness of breath, orthopnea, PND, lower extremity edema, dizziness, presyncope, syncope, bleeding, or neurologic sequela. The patient is tolerating medications without difficulties and is otherwise without complaint today.    Atrial Fibrillation Risk Factors:  she does have diagnosis of sleep apnea.   she has a BMI of There is no height or weight on file to calculate BMI.. There were no vitals filed for this visit.    Current Facility-Administered Medications  Medication Dose Route Frequency Provider Last Rate Last Admin   potassium chloride  (KLOR-CON ) packet 60 mEq  60 mEq Oral Once Ollis, Brandi L, NP        Atrial Fibrillation Management history:  Previous antiarrhythmic drugs: None Previous cardioversions: 03/18/21, 11/20/2021, 03/22/23, 11/23/23 Previous ablations: None Anticoagulation history: Eliquis  5 mg BID   ROS- All systems are reviewed and negative except as per the HPI above.  Physical Exam: There  were no vitals taken for this visit.  GEN: Well nourished, well developed in no acute distress CARDIAC: Regular rate and rhythm, no murmurs, rubs, gallops RESPIRATORY:  Clear to auscultation without rales, wheezing or rhonchi  ABDOMEN: Soft, non-tender, non-distended EXTREMITIES:  No edema; No deformity    EKG today demonstrates  SB, RBBB Vent. rate 58 BPM PR interval 120 ms QRS duration 128 ms QT/QTcB 442/433 ms   Echo 06/08/22 demonstrated   1. Left ventricular ejection fraction, by estimation, is 65 to 70%. The  left ventricle has normal function. The left ventricle has no regional  wall motion abnormalities. There is mild left ventricular hypertrophy.  Left ventricular diastolic parameters  are consistent with Grade I diastolic dysfunction (impaired relaxation).  Elevated left atrial pressure.   2. Right ventricular systolic function is normal. The right ventricular  size is normal. There is normal pulmonary artery systolic pressure.   3. Left atrial size was moderately dilated.   4. Right atrial size was mild to moderately dilated.   5. The mitral valve is normal in structure. No evidence of mitral valve  regurgitation. No evidence of mitral stenosis.   6. The tricuspid valve is abnormal.   7. The aortic valve is tricuspid. Aortic valve regurgitation is not  visualized. No aortic stenosis is present.   8. The inferior vena cava is normal in size with greater than 50%  respiratory variability, suggesting right atrial pressure of 3 mmHg.    CHA2DS2-VASc Score = 4  The patient's score is based upon: CHF History: 0 HTN History: 1 Diabetes History: 1 Stroke History: 0 Vascular Disease History: 1 Age  Score: 0 Gender Score: 1       ASSESSMENT AND PLAN: Paroxysmal Atrial Fibrillation (ICD10:  I48.0) The patient's CHA2DS2-VASc score is 4, indicating a 4.8% annual risk of stroke.   Patient in SR today. Patient presents for dofetilide admission Continue Eliquis  5 mg  BID, states no missed doses in the last 3 weeks. No recent benadryl use PharmD has screened medications for QT prolonging agents.  QTc in SR 433 ms Labs today show creatinine at 1.11, K+ 3.4 and mag 2.0, CrCl calculated at 96 mL/min  Secondary Hypercoagulable State (ICD10:  D68.69) The patient is at significant risk for stroke/thromboembolism based upon her CHA2DS2-VASc Score of 4.  Continue Apixaban  (Eliquis ). No bleeding issues.   OSA  Encouraged nightly CPAP  Obesity There is no height or weight on file to calculate BMI.  Encouraged lifestyle modification  HTN Stable on current regimen   To be admitted later today once a bed becomes available.     Kathryn Kicks PA-C Afib Clinic [facility] [address] [place], [place] [address] [phone]  ________________________________________________________  Admit from AF Clinic for High Risk Medication Loading: Tikosyn   Primary EP > Dr. Kennyth Admit Labs > K+ 3.4, Mg+ 2.0, Cr 1.11 / Cr Cl 97 mL/min EKG Review > QTc 433 ms in NSR on presentation 03/12/24  Anticipated Tikosyn Dose > 500 mcg BID  Anticoagulation > Eliquis  5mg  BID for CHA2DS2-VASc 4   KCL 60 mEq x1 with repeat BMP 1900  Daphne Barrack, NP-C, AGACNP-BC [facility] - Electrophysiology  03/12/2024, 4:25 PM  I have seen, examined the patient, and reviewed the above assessment and plan.    HPI: Kathryn Merritt is a 63 year old female with past medical history notable for symptomatic paroxysmal atrial fibrillation who presents today for scheduled Tikosyn loading.  She reports feeling relatively well and has no new or acute complaints today.  General: Well developed, in no acute distress.  Neck: No JVD.  Cardiac: Bradycardic, regular rhythm.  Resp: Normal work of breathing.  Ext: No edema.  Neuro: No gross focal deficits.  Psych: Normal affect.   Cr: 1.13 K: 3.8  EKG: Ectopic atrial rhythm, Qtc  Assessment: Paroxysmal atrial  fibrillation Secondary hypercoagulable state due to atrial fibrillation High risk medication use  Plan:  - Continue Tikosyn 500 mcg with ECG 2 hours after each dose.  - Continue routine monitoring of kidney function and electrolytes, replete as necessary.  - Continue Eliquis  5mg  BID.  Fonda Kennyth, MD 03/12/2024 11:02 PM

## 2024-03-13 ENCOUNTER — Telehealth (HOSPITAL_COMMUNITY): Payer: Self-pay | Admitting: Pharmacy Technician

## 2024-03-13 ENCOUNTER — Other Ambulatory Visit (HOSPITAL_COMMUNITY): Payer: Self-pay

## 2024-03-13 DIAGNOSIS — I480 Paroxysmal atrial fibrillation: Secondary | ICD-10-CM

## 2024-03-13 DIAGNOSIS — Z79899 Other long term (current) drug therapy: Secondary | ICD-10-CM

## 2024-03-13 LAB — BASIC METABOLIC PANEL WITH GFR
Anion gap: 7 (ref 5–15)
BUN: 22 mg/dL (ref 8–23)
CO2: 20 mmol/L — ABNORMAL LOW (ref 22–32)
Calcium: 9.2 mg/dL (ref 8.9–10.3)
Chloride: 114 mmol/L — ABNORMAL HIGH (ref 98–111)
Creatinine, Ser: 1.05 mg/dL — ABNORMAL HIGH (ref 0.44–1.00)
GFR, Estimated: >60 mL/min (ref >60)
Glucose, Bld: 230 mg/dL — ABNORMAL HIGH (ref 70–99)
Potassium: 4 mmol/L (ref 3.5–5.1)
Sodium: 141 mmol/L (ref 135–145)

## 2024-03-13 LAB — GLUCOSE, CAPILLARY
Glucose-Capillary: 316 mg/dL — ABNORMAL HIGH (ref 70–99)
Glucose-Capillary: 220 mg/dL — ABNORMAL HIGH (ref 70–99)

## 2024-03-13 LAB — HEMOGLOBIN A1C
Hgb A1c MFr Bld: 10.1 % — ABNORMAL HIGH (ref 4.8–5.6)
Mean Plasma Glucose: 243.17 mg/dL

## 2024-03-13 LAB — MAGNESIUM: Magnesium: 2.3 mg/dL (ref 1.7–2.4)

## 2024-03-13 MED ORDER — INSULIN ASPART 100 UNIT/ML IJ SOLN
0.0000 [IU] | Freq: Three times a day (TID) | INTRAMUSCULAR | Status: DC
  Administered 2024-03-13 – 2024-03-14 (×2): 3 [IU] via SUBCUTANEOUS
  Administered 2024-03-14: 8 [IU] via SUBCUTANEOUS
  Administered 2024-03-14: 5 [IU] via SUBCUTANEOUS
  Administered 2024-03-15: 2 [IU] via SUBCUTANEOUS
  Administered 2024-03-15: 5 [IU] via SUBCUTANEOUS

## 2024-03-13 NOTE — Progress Notes (Signed)
 Pharmacy: Dofetilide (Tikosyn) - Follow Up Assessment and Electrolyte Replacement  Pharmacy consulted to assist in monitoring and replacing electrolytes in this 63 y.o. female admitted on 03/12/2024 undergoing dofetilide initiation. First dofetilide dose: 6/23  Labs:    Component Value Date/Time   K 4.0 03/13/2024 0407   MG 2.3 03/13/2024 0407     Plan: Potassium: K >/= 4: No additional supplementation needed  Magnesium: Mg > 2: No additional supplementation needed    Thank you for allowing pharmacy to participate in this patient's care   Jinnie Door, PharmD, BCPS, BCCP Clinical Pharmacist  Please check AMION for all [facility] Pharmacy phone numbers After 10:00 PM, call Main Pharmacy [phone]

## 2024-03-13 NOTE — Progress Notes (Addendum)
 Electrophysiology Rounding Note  Patient Name: Kathryn Merritt Date of Encounter: 03/13/2024  Primary Cardiologist: Lynwood Schilling, MD  Electrophysiologist: Fonda Kitty, MD    Subjective   Pt remains in NSR on Tikosyn 500 mcg BID   QTc from EKG last pm shows stable QTc at 472 ms  The patient is doing well today.  At this time, the patient denies chest pain, shortness of breath, or any new concerns.  Inpatient Medications    Scheduled Meds:  apixaban   5 mg Oral BID   cholecalciferol  2,000 Units Oral Daily   dofetilide  500 mcg Oral BID   glipiZIDE   5 mg Oral BID AC   insulin  glargine-yfgn  20 Units Subcutaneous Daily   lisinopril   20 mg Oral Daily   lubiprostone   24 mcg Oral BID   pantoprazole   40 mg Oral Daily   potassium chloride   40 mEq Oral Once   rosuvastatin   10 mg Oral Daily   sodium chloride  flush  3 mL Intravenous Q12H   torsemide   100 mg Oral Daily   Continuous Infusions:  sodium chloride      PRN Meds: sodium chloride , acetaminophen , albuterol , sodium chloride  flush   Vital Signs    Vitals:   03/12/24 1636 03/12/24 2033 03/13/24 0500  BP: 120/64 (!) 104/51 (!) 140/68  Pulse: 78 (!) 50 60  Resp: 18 18 18   Temp: 98 F (36.7 C) 98.3 F (36.8 C) 98.4 F (36.9 C)  TempSrc: Oral Oral Oral  SpO2: 98% 97% 98%  Weight: 115.1 kg    Height: 5' 3 (1.6 m)      Intake/Output Summary (Last 24 hours) at 03/13/2024 0659 Last data filed at 03/13/2024 0454 Gross per 24 hour  Intake 820 ml  Output --  Net 820 ml   Filed Weights   03/12/24 1636  Weight: 115.1 kg    Physical Exam    GEN- NAD, A&O x 3. Normal affect.  Lungs- CTAB, Normal effort.  Heart- Regular rate and rhythm. No M/G/R GI- Soft, NT, ND Extremities- No clubbing, cyanosis, or edema Skin- no rash or lesion  Labs    CBC No results for input(s): WBC, NEUTROABS, HGB, HCT, MCV, PLT in the last 72 hours. Basic Metabolic Panel Recent Labs    93/76/74 0930 03/12/24 1737  03/13/24 0407  NA 142 139 141  K 3.4* 3.8 4.0  CL 109* 111 114*  CO2 23 21* 20*  GLUCOSE 195* 349* 230*  BUN 29* 26* 22  CREATININE 1.11* 1.13* 1.05*  CALCIUM  9.9 9.6 9.2  MG 2.0  --  2.3    Telemetry    SB 40's - SR 90's, rare PVC (personally reviewed)  Patient Profile     Kathryn Merritt is a 63 y.o. female with a past medical history significant for paroxysmal atrial fibrillation, HTN, OSA, obesity.  They were admitted for tikosyn load.   Assessment & Plan    Paroxysmal Atrial Fibrillation Pt remains in NSR on Tikosyn 500 mcg BID  Continue Eliquis  Creatinine, ser  1.05* (06/24 0407) Magnesium  2.3 (06/24 0407) Potassium4.0 (06/24 0407) No electrolyte replacement 6/24   Plan for home Thursday if QTc remains stable.   For questions or updates, please contact [facility] Please consult [email or web address] for contact info under Cardiology/STEMI.  Signed, Daphne Barrack, NP-C, AGACNP-BC [facility] - Electrophysiology  03/13/2024, 7:10 AM  I have seen, examined the patient, and reviewed the above assessment and plan.    Interval: No  acute overnight events. Patient reports feeling relatively well. No new or acute complaints.   General: Well developed, in no acute distress.  Neck: No JVD.  Cardiac: Bradycardic, regular rhythm.  Resp: Normal work of breathing.  Ext: No edema.  Neuro: No gross focal deficits.  Psych: Normal affect.   Cr: 1.05 K: 4.0  EKG: Sinus rhythm with RBBB, Qtc  Assessment: Paroxysmal atrial fibrillation Secondary hypercoagulable state due to atrial fibrillation High risk medication use   Plan:  - Continue Tikosyn 500 mcg with ECG 2 hours after each dose.  - Continue routine monitoring of kidney function and electrolytes, replete as necessary.  - Continue Eliquis  5mg  BID.  Fonda Kitty, MD 03/13/2024 8:36 PM

## 2024-03-13 NOTE — Plan of Care (Signed)
  Problem: Health Behavior/Discharge Planning: Goal: Ability to manage health-related needs will improve Outcome: Progressing   Problem: Clinical Measurements: Goal: Cardiovascular complication will be avoided Outcome: Progressing   Problem: Activity: Goal: Risk for activity intolerance will decrease Outcome: Progressing   Problem: Safety: Goal: Ability to remain free from injury will improve Outcome: Progressing   Problem: Activity: Goal: Ability to tolerate increased activity will improve Outcome: Progressing   Problem: Cardiac: Goal: Ability to achieve and maintain adequate cardiopulmonary perfusion will improve Outcome: Progressing

## 2024-03-13 NOTE — Inpatient Diabetes Management (Addendum)
 Inpatient Diabetes Program Recommendations  AACE/ADA: New Consensus Statement on Inpatient Glycemic Control (2015)  Target Ranges:  Prepandial:   less than 140 mg/dL      Peak postprandial:   less than 180 mg/dL (1-2 hours)      Critically ill patients:  140 - 180 mg/dL   Lab Results  Component Value Date   GLUCAP 316 (H) 03/13/2024   HGBA1C 9.9 (H) 03/08/2024    Review of Glycemic Control  Latest Reference Range & Units 03/12/24 21:14 03/13/24 08:42  Glucose-Capillary 70 - 99 mg/dL 713 (H) 683 (H)   Diabetes history: DM 2 Outpatient Diabetes medications: Basaglar  20 units, Glipizide  5 mg bid Current orders for Inpatient glycemic control:  Semglee 20 units Daily Glipizide  5 mg bid  A1c 9.9% on 6/19 Note: First dose of Semglee given this am  Inpatient Diabetes Program Recommendations:    -   Add Novolog  0-15 units tid + hs  Spoke with pt at bedside regarding A1c of 9.9% on 6/19 obtained at PCP office visit. Pt reports being on glipizide  5 mg bid at home. At PCP visit, MD tried to start Januvia  (which was going to be more expensive, pt has also been on Mounjaro  and Ozempic  in the past however pt insurance does not cover medications well). Pt was then placed on insulin  for the first time. Pt had not started insulin  prior to this admission.   First dose of basal insulin  given this am.  Will follow glucose trends and titrate for glucose control. Goal 7% 150 average.   Discussed glucose and A1c goals to pt. Pt has been on injectable in the past so operation of insulin  pen should not be a problem. Copay of insulin  and pen needles cheaper through insurance than copay card online  Thanks,  Clotilda Bull RN, MSN, BC-ADM Inpatient Diabetes Coordinator Team Pager [phone] (8a-5p)

## 2024-03-13 NOTE — Progress Notes (Signed)
 Morning EKG reviewed     Shows remains in NSR with stable QTc at 474 ms.  Continue  Tikosyn 500 mcg BID.   Potassium4.0 (06/24 0407) Magnesium  2.3 (06/24 0407) Creatinine, ser  1.05* (06/24 0407)  Plan for home Thursday if QTc remains stable    Daphne Barrack, NP-C, AGACNP-BC [facility] - Electrophysiology  03/13/2024, 11:39 AM

## 2024-03-13 NOTE — Telephone Encounter (Signed)
 Patient Product/process development scientist completed.    The patient is insured through [employer]. Patient has [employer], may use a copay card, and/or apply for patient assistance if available.    Ran test claim for dofetilide (Tikosyn) 500 mcg and the current 30 day co-pay is $16.33.   This test claim was processed through [facility]- copay amounts may vary at other pharmacies due to pharmacy/plan contracts, or as the patient moves through the different stages of their insurance plan.     Reyes Sharps, CPHT Pharmacy Technician III Certified Patient Advocate [facility] Patient Advocate Team Direct Number: [phone]  Fax: [phone]

## 2024-03-14 ENCOUNTER — Encounter (HOSPITAL_COMMUNITY)
Admission: RE | Disposition: A | Discharge status: Home / Self Care | Payer: Self-pay | Source: Ambulatory Visit | Attending: *Deleted

## 2024-03-14 DIAGNOSIS — Z79899 Other long term (current) drug therapy: Secondary | ICD-10-CM | POA: Diagnosis not present

## 2024-03-14 DIAGNOSIS — I480 Paroxysmal atrial fibrillation: Secondary | ICD-10-CM | POA: Diagnosis not present

## 2024-03-14 LAB — BASIC METABOLIC PANEL WITH GFR
Anion gap: 10 (ref 5–15)
BUN: 28 mg/dL — ABNORMAL HIGH (ref 8–23)
CO2: 22 mmol/L (ref 22–32)
Calcium: 9.8 mg/dL (ref 8.9–10.3)
Chloride: 108 mmol/L (ref 98–111)
Creatinine, Ser: 1.09 mg/dL — ABNORMAL HIGH (ref 0.44–1.00)
GFR, Estimated: 58 mL/min — ABNORMAL LOW (ref >60)
Glucose, Bld: 212 mg/dL — ABNORMAL HIGH (ref 70–99)
Potassium: 4 mmol/L (ref 3.5–5.1)
Sodium: 140 mmol/L (ref 135–145)

## 2024-03-14 LAB — GLUCOSE, CAPILLARY
Glucose-Capillary: 159 mg/dL — ABNORMAL HIGH (ref 70–99)
Glucose-Capillary: 259 mg/dL — ABNORMAL HIGH (ref 70–99)
Glucose-Capillary: 159 mg/dL — ABNORMAL HIGH (ref 70–99)

## 2024-03-14 LAB — MAGNESIUM: Magnesium: 2 mg/dL (ref 1.7–2.4)

## 2024-03-14 SURGERY — CARDIOVERSION (CATH LAB)
Anesthesia: General

## 2024-03-14 NOTE — Plan of Care (Signed)
  Problem: Health Behavior/Discharge Planning: Goal: Ability to manage health-related needs will improve Outcome: Progressing   Problem: Clinical Measurements: Goal: Respiratory complications will improve Outcome: Progressing Goal: Cardiovascular complication will be avoided Outcome: Progressing   Problem: Activity: Goal: Risk for activity intolerance will decrease Outcome: Progressing   Problem: Nutrition: Goal: Adequate nutrition will be maintained Outcome: Progressing   Problem: Safety: Goal: Ability to remain free from injury will improve Outcome: Progressing   Problem: Activity: Goal: Ability to tolerate increased activity will improve Outcome: Progressing   Problem: Cardiac: Goal: Ability to achieve and maintain adequate cardiopulmonary perfusion will improve Outcome: Progressing

## 2024-03-14 NOTE — Progress Notes (Signed)
   03/14/24 2259  Assess: MEWS Score  Temp 97.8 F (36.6 C)  BP (!) 100/50  MAP (mmHg) (!) 64  Pulse Rate (!) 46  ECG Heart Rate (!) 46  Resp 18  Level of Consciousness Alert  SpO2 98 %  O2 Device Room Air  Assess: MEWS Score  MEWS Temp 0  MEWS Systolic 1  MEWS Pulse 1  MEWS RR 0  MEWS LOC 0  MEWS Score 2  MEWS Score Color Yellow  Assess: if the MEWS score is Yellow or Red  Were vital signs accurate and taken at a resting state? Yes  Does the patient meet 2 or more of the SIRS criteria? Yes  Does the patient have a confirmed or suspected source of infection? No  MEWS guidelines implemented  Yes, yellow  Treat  MEWS Interventions Considered administering scheduled or prn medications/treatments as ordered  Take Vital Signs  Increase Vital Sign Frequency  Yellow: Q2hr x1, continue Q4hrs until patient remains green for 12hrs  Escalate  MEWS: Escalate Yellow: Discuss with charge nurse and consider notifying provider and/or RRT  Notify: Charge Nurse/RN  Name of Charge Nurse/RN Notified Heather  Assess: SIRS CRITERIA  SIRS Temperature  0  SIRS Respirations  0  SIRS Pulse 0  SIRS WBC 0  SIRS Score Sum  0

## 2024-03-14 NOTE — Inpatient Diabetes Management (Signed)
 Inpatient Diabetes Program Recommendations  AACE/ADA: New Consensus Statement on Inpatient Glycemic Control (2015)  Target Ranges:  Prepandial:   less than 140 mg/dL      Peak postprandial:   less than 180 mg/dL (1-2 hours)      Critically ill patients:  140 - 180 mg/dL   Lab Results  Component Value Date   GLUCAP 259 (H) 03/14/2024   HGBA1C 10.1 (H) 03/13/2024    Review of Glycemic Control  Latest Reference Range & Units 03/12/24 21:14 03/13/24 08:42 03/13/24 11:57 03/13/24 16:19 03/14/24 08:34  Glucose-Capillary 70 - 99 mg/dL 713 (H) 683 (H) 779 (H) 159 (H) 259 (H)  (H): Data is abnormally high Diabetes history: DM 2 Outpatient Diabetes medications: Basaglar  20 units, Glipizide  5 mg bid Current orders for Inpatient glycemic control:  Semglee 20 units Daily Glipizide  5 mg bid Novolog  0-15 units TID     Inpatient Diabetes Program Recommendations:    Consider increasing Semglee 24 units every day   Thanks, Tinnie Minus, MSN, RNC-OB Diabetes Coordinator [phone] (8a-5p)

## 2024-03-14 NOTE — TOC CM/SW Note (Signed)
 Transition of Care [facility]) - Inpatient Brief Assessment   Patient Details  Name: Kathryn Merritt MRN: [id number] Date of Birth: 26-Feb-1961  Transition of Care [facility] [place]) CM/SW Contact:    Sudie Erminio Deems, RN Phone Number: 03/14/2024, 4:41 PM   Clinical Narrative: Patient presented for Tikosyn Load. Case Manager spoke with the patient regarding co pay cost. Patient is agreeable to cost and would like to have the initial Rx filled via [facility] and the Rx refills 90 day supply escribed to [facility] in [place]. No further needs identified at this time.    Transition of Care Asessment: Insurance and Status: Insurance coverage has been reviewed Patient has primary care physician: Yes Home environment has been reviewed: reviewed Prior level of function:: independent Prior/Current Home Services: No current home services Social Drivers of Health Review: SDOH reviewed no interventions necessary Readmission risk has been reviewed: Yes Transition of care needs: no transition of care needs at this time

## 2024-03-14 NOTE — Progress Notes (Addendum)
 Pharmacy: Dofetilide (Tikosyn) - Follow Up Assessment and Electrolyte Replacement  Pharmacy consulted to assist in monitoring and replacing electrolytes in this 63 y.o. female admitted on 03/12/2024 undergoing dofetilide initiation. First dofetilide dose: 6/23  Labs:    Component Value Date/Time   K 4.0 03/13/2024 0407   MG 2.3 03/13/2024 0407     Plan: Potassium: K >/= 4: No additional supplementation needed  Magnesium: Mg > 2: No additional supplementation needed    Thank you for allowing pharmacy to participate in this patient's care   Jinnie Door, PharmD, BCPS, BCCP Clinical Pharmacist  Please check AMION for all [facility] phone numbers After 10:00 PM, call Main Pharmacy [phone]

## 2024-03-14 NOTE — Progress Notes (Signed)
 Morning EKG reviewed     Shows remains in NSR with stable QTc at 470 ms.  Continue  Tikosyn 500 mcg BID.   Potassium4.0 (06/25 9374) Magnesium  2.0 (06/25 9374) Creatinine, ser  1.09* (06/25 9374)  Plan for home Thursday if QTc remains stable     Daphne Barrack, NP-C, AGACNP-BC [facility] - Electrophysiology  03/14/2024, 12:40 PM

## 2024-03-14 NOTE — Progress Notes (Addendum)
 Electrophysiology Rounding Note  Patient Name: Kathryn Merritt Date of Encounter: 03/14/2024  Primary Cardiologist: Lynwood Schilling, MD  Electrophysiologist: Fonda Kitty, MD    Subjective   Pt remains in NSR on Tikosyn 500 mcg BID   QTc from EKG last pm shows stable QTc at 476 ms   The patient is doing well today.  At this time, the patient denies chest pain, shortness of breath, or any new concerns.  Inpatient Medications    Scheduled Meds:  apixaban   5 mg Oral BID   cholecalciferol  2,000 Units Oral Daily   dofetilide  500 mcg Oral BID   glipiZIDE   5 mg Oral BID AC   insulin  aspart  0-15 Units Subcutaneous TID WC   insulin  glargine-yfgn  20 Units Subcutaneous Daily   lisinopril   20 mg Oral Daily   lubiprostone   24 mcg Oral BID   pantoprazole   40 mg Oral Daily   potassium chloride   40 mEq Oral Once   rosuvastatin   10 mg Oral Daily   sodium chloride  flush  3 mL Intravenous Q12H   torsemide   100 mg Oral Daily   Continuous Infusions:  PRN Meds: acetaminophen , albuterol , sodium chloride  flush   Vital Signs    Vitals:   03/13/24 1615 03/13/24 2008 03/14/24 0052 03/14/24 0601  BP: 120/67 127/74 100/67 118/74  Pulse: (!) 55 (!) 54 (!) 54 (!) 50  Resp: 16 17 17 18   Temp: 98.5 F (36.9 C) 97.9 F (36.6 C) 98 F (36.7 C) 97.9 F (36.6 C)  TempSrc: Oral Oral Oral Oral  SpO2: 99% 100% 99% 98%  Weight:      Height:        Intake/Output Summary (Last 24 hours) at 03/14/2024 0729 Last data filed at 03/13/2024 0800 Gross per 24 hour  Intake 240 ml  Output --  Net 240 ml   Filed Weights   03/12/24 1636  Weight: 115.1 kg    Physical Exam    GEN- NAD, A&O x 3. Normal affect.  Lungs- CTAB, Normal effort.  Heart- Regular rate and rhythm. No M/G/R GI- Soft, NT, ND Extremities- No clubbing, cyanosis, or edema Skin- no rash or lesion  Labs    CBC No results for input(s): WBC, NEUTROABS, HGB, HCT, MCV, PLT in the last 72 hours. Basic Metabolic  Panel Recent Labs    03/12/24 0930 03/12/24 1737 03/13/24 0407  NA 142 139 141  K 3.4* 3.8 4.0  CL 109* 111 114*  CO2 23 21* 20*  GLUCOSE 195* 349* 230*  BUN 29* 26* 22  CREATININE 1.11* 1.13* 1.05*  CALCIUM  9.9 9.6 9.2  MG 2.0  --  2.3    Telemetry    SB 40's - 50's, occ PVC (personally reviewed)  Patient Profile     Kathryn Merritt is a 63 y.o. female with a past medical history significant for persistent atrial fibrillation.  They were admitted for tikosyn load.   Assessment & Plan    Paroxysmal Atrial Fibrillation Pt remains in NSR on Tikosyn 500 mcg BID  Continue Eliquis  Creatinine, ser  1.05* (06/24 0407) Magnesium  2.3 (06/24 0407) Potassium4.0 (06/24 0407) No electrolyte supplementation needed  Plan for home Thursday if QTc remains stable.  Secondary Hypercoagulable State  -continue Eliquis  for stroke prophylaxis    For questions or updates, please contact [facility] Please consult [email or web address] for contact info under Cardiology/STEMI.  Signed, Daphne Barrack, NP-C, AGACNP-BC [facility] - Electrophysiology  03/14/2024, 7:31 AM  I have seen, examined the patient, and reviewed the above assessment and plan.    Interval: No acute overnight events. Patient reports feeling relatively well. No new or acute complaints.   General: Well developed, in no acute distress.  Neck: No JVD.  Cardiac: Bradycardic, regular rhythm.  Resp: Normal work of breathing.  Ext: No edema.  Neuro: No gross focal deficits.  Psych: Normal affect.   Cr: 1.09 K: 4.0  EKG: Ectopic atrial rhythm with RBBB, Qtc stable at ~484ms   Assessment: Paroxysmal atrial fibrillation Secondary hypercoagulable state due to atrial fibrillation High risk medication use   Plan:  - Continue Tikosyn 500 mcg with ECG 2 hours after each dose.  - Continue routine monitoring of kidney function and electrolytes, replete as necessary.  - Continue Eliquis  5mg  BID.  Fonda Kitty,  MD 03/14/2024 9:25 PM

## 2024-03-14 NOTE — Plan of Care (Signed)
  Problem: Clinical Measurements: Goal: Respiratory complications will improve Outcome: Progressing Goal: Cardiovascular complication will be avoided Outcome: Progressing   Problem: Activity: Goal: Risk for activity intolerance will decrease Outcome: Progressing   Problem: Elimination: Goal: Will not experience complications related to bowel motility Outcome: Progressing   Problem: Pain Managment: Goal: General experience of comfort will improve and/or be controlled Outcome: Progressing

## 2024-03-15 ENCOUNTER — Other Ambulatory Visit (HOSPITAL_COMMUNITY): Payer: Self-pay

## 2024-03-15 DIAGNOSIS — I480 Paroxysmal atrial fibrillation: Secondary | ICD-10-CM | POA: Diagnosis not present

## 2024-03-15 LAB — BASIC METABOLIC PANEL WITH GFR
Anion gap: 6 (ref 5–15)
BUN: 37 mg/dL — ABNORMAL HIGH (ref 8–23)
CO2: 25 mmol/L (ref 22–32)
Calcium: 9.7 mg/dL (ref 8.9–10.3)
Chloride: 106 mmol/L (ref 98–111)
Creatinine, Ser: 1.31 mg/dL — ABNORMAL HIGH (ref 0.44–1.00)
GFR, Estimated: 46 mL/min — ABNORMAL LOW (ref >60)
Glucose, Bld: 214 mg/dL — ABNORMAL HIGH (ref 70–99)
Potassium: 3.6 mmol/L (ref 3.5–5.1)
Sodium: 137 mmol/L (ref 135–145)

## 2024-03-15 LAB — GLUCOSE, CAPILLARY
Glucose-Capillary: 215 mg/dL — ABNORMAL HIGH (ref 70–99)
Glucose-Capillary: 143 mg/dL — ABNORMAL HIGH (ref 70–99)
Glucose-Capillary: 230 mg/dL — ABNORMAL HIGH (ref 70–99)
Glucose-Capillary: 125 mg/dL — ABNORMAL HIGH (ref 70–99)

## 2024-03-15 LAB — MAGNESIUM: Magnesium: 1.8 mg/dL (ref 1.7–2.4)

## 2024-03-15 MED ORDER — POTASSIUM CHLORIDE CRYS ER 20 MEQ PO TBCR
40.0000 meq | EXTENDED_RELEASE_TABLET | Freq: Once | ORAL | Status: AC
  Administered 2024-03-15: 40 meq via ORAL
  Filled 2024-03-15: qty 2

## 2024-03-15 MED ORDER — DOFETILIDE 500 MCG PO CAPS
500.0000 ug | ORAL_CAPSULE | Freq: Two times a day (BID) | ORAL | 6 refills | Status: DC
  Filled 2024-03-15: qty 60, 30d supply, fill #0

## 2024-03-15 MED ORDER — POTASSIUM CHLORIDE CRYS ER 20 MEQ PO TBCR
20.0000 meq | EXTENDED_RELEASE_TABLET | Freq: Once | ORAL | Status: AC
  Administered 2024-03-15: 20 meq via ORAL
  Filled 2024-03-15: qty 1

## 2024-03-15 MED ORDER — MAGNESIUM SULFATE 2 GM/50ML IV SOLN
2.0000 g | Freq: Once | INTRAVENOUS | Status: AC
  Administered 2024-03-15: 2 g via INTRAVENOUS
  Filled 2024-03-15: qty 50

## 2024-03-15 MED ORDER — DOFETILIDE 500 MCG PO CAPS: 500.0000 ug | ORAL_CAPSULE | Freq: Two times a day (BID) | ORAL | 6 refills | Status: DC

## 2024-03-15 NOTE — Progress Notes (Signed)
 Pharmacy: Dofetilide (Tikosyn) - Follow Up Assessment and Electrolyte Replacement  Pharmacy consulted to assist in monitoring and replacing electrolytes in this 63 y.o. female admitted on 03/12/2024 undergoing dofetilide initiation. First dofetilide dose: 6/23  Labs:    Component Value Date/Time   K 3.6 03/15/2024 0420   MG 1.8 03/15/2024 0420     Plan: Potassium: K 3.5-3.7:  Give KCl 60 mEq po x1 - 40 mEq already ordered by provider, will order another 20 mEq   Magnesium: Mg 1.8-2: Give Mg 2 gm IV x1     Thank you for allowing pharmacy to participate in this patient's care   Jinnie Door, PharmD, BCPS, BCCP Clinical Pharmacist  Please check AMION for all [facility] phone numbers After 10:00 PM, call Main Pharmacy [phone]

## 2024-03-15 NOTE — Discharge Summary (Addendum)
 ELECTROPHYSIOLOGY DISCHARGE SUMMARY    Patient ID: Kathryn Merritt,  MRN: [id number], DOB/AGE: October 13, 1960 63 y.o.  Admit date: 03/12/2024 Discharge date: 03/15/2024  Primary Care Physician: [name], FNP  Primary Cardiologist: Lynwood Schilling, MD  Electrophysiologist: Dr. Kennyth   Primary Discharge Diagnosis:  1.  Paroxysmal atrial fibrillation status post Tikosyn loading this admission  Secondary Discharge Diagnosis:  Secondary Hypercoagulable State  Allergies  Allergen Reactions   Metformin  And Related     Made patient feel bad/GI     Procedures This Admission:  1.  Tikosyn loading    Brief HPI: Kathryn Merritt is a 64 y.o. female with a past medical history as noted above.  They were referred to EP for treatment options of atrial fibrillation.  Risks, benefits, and alternatives to Tikosyn were reviewed with the patient who wished to proceed with admission for loading.  Hospital Course:  The patient was admitted and Tikosyn was initiated.  Renal function and electrolytes were followed during the hospitalization.  Their QTc remained stable. The patient presented in sinus rhythm and did not require cardioversion. The patients QTc remained stable. They were monitored on telemetry up to discharge. On the day of discharge, they were examined by Dr. Kennyth  who considered them stable for discharge to home.  Follow-up has been arranged with the Atrial Fibrillation clinic in approximately 1 week.   Physical Exam: Vitals:   03/14/24 2259 03/15/24 0519 03/15/24 0841 03/15/24 1218  BP: (!) 100/50 108/78 121/71 120/71  Pulse: (!) 46 (!) 58 (!) 51   Resp: 18 18    Temp: 97.8 F (36.6 C) 98.4 F (36.9 C) 97.8 F (36.6 C) 97.7 F (36.5 C)  TempSrc: Oral Oral Oral Oral  SpO2: 98% 97%    Weight:      Height:        GEN- NAD, A&O x 3. Normal affect.  Lungs- CTAB, Normal effort.  Heart- Regular rate and rhythm. No M/G/R GI- Soft, NT, ND Extremities- No clubbing,  cyanosis, or edema Skin- no rash or lesion  Labs:   Lab Results  Component Value Date   WBC 7.7 03/08/2024   HGB 13.7 03/08/2024   HCT 44.1 03/08/2024   MCV 94 03/08/2024   PLT 198 03/08/2024    Recent Labs  Lab 03/08/24 1548 03/12/24 0930 03/15/24 0420  NA 143   < > 137  K 3.7   < > 3.6  CL 105   < > 106  CO2 22   < > 25  BUN 25   < > 37*  CREATININE 1.25*   < > 1.31*  CALCIUM  10.3   < > 9.7  PROT 6.3  --   --   BILITOT <0.2  --   --   ALKPHOS 77  --   --   ALT 14  --   --   AST 17  --   --   GLUCOSE 231*   < > 214*   < > = values in this interval not displayed.    Discharge Medications:  Allergies as of 03/15/2024       Reactions   Metformin  And Related    Made patient feel bad/GI        Medication List     TAKE these medications    acetaminophen  500 MG tablet Commonly known as: TYLENOL  Take 1,000 mg by mouth every 8 (eight) hours as needed for moderate pain.   albuterol  108 (90 Base)  MCG/ACT inhaler Commonly known as: VENTOLIN  HFA Inhale 2 puffs into the lungs every 6 (six) hours as needed for wheezing or shortness of breath.   apixaban  5 MG Tabs tablet Commonly known as: Eliquis  Take 1 tablet (5 mg total) by mouth 2 (two) times daily.   Basaglar  KwikPen 100 UNIT/ML Inject 20 Units into the skin daily.   blood glucose meter kit and supplies Dispense based on patient and insurance preference. Use up to four times daily as directed. (FOR ICD-10 E10.9, E11.9).   cetirizine  10 MG tablet Commonly known as: ZyrTEC  Allergy Take 1 tablet (10 mg total) by mouth daily. What changed:  when to take this reasons to take this   dofetilide 500 MCG capsule Commonly known as: TIKOSYN Take 1 capsule (500 mcg total) by mouth 2 (two) times daily.   glipiZIDE  5 MG tablet Commonly known as: GLUCOTROL  Take 1 tablet (5 mg total) by mouth 2 (two) times daily before a meal.   lisinopril  20 MG tablet Commonly known as: ZESTRIL  Take 1.5 tablets (30 mg total)  by mouth daily. What changed:  how much to take when to take this   lubiprostone  24 MCG capsule Commonly known as: AMITIZA  Take 1 capsule (24 mcg total) by mouth 2 (two) times daily. What changed: when to take this   nitrofurantoin  (macrocrystal-monohydrate) 100 MG capsule Commonly known as: Macrobid  Take 1 capsule (100 mg total) by mouth 2 (two) times daily. 1 po BId   pantoprazole  40 MG tablet Commonly known as: PROTONIX  Take 1 tablet (40 mg total) by mouth daily. What changed: when to take this   Pen Needles 31G X 5 MM Misc 1 Needle by Does not apply route in the morning, at noon, in the evening, and at bedtime.   potassium chloride  SA 20 MEQ tablet Commonly known as: KLOR-CON  M Take 1 tablet (20 mEq total) by mouth 2 (two) times daily.   rosuvastatin  10 MG tablet Commonly known as: CRESTOR  Take 1 tablet (10 mg total) by mouth daily. What changed: when to take this   torsemide  100 MG tablet Commonly known as: DEMADEX  Take 1 tablet (100 mg total) by mouth daily. What changed: when to take this        Disposition:  Home with follow up in AF clinic in 1 week as in AVS.   Signed, Daphne Barrack, NP-C, AGACNP-BC [facility] - Electrophysiology  03/15/2024, 2:06 PM   I have seen, examined the patient, and reviewed the above assessment and plan.    Hospital Course:  Patient presented for scheduled Tikosyn loading. She received 6 total doses under close monitoring. QT interval remained stable. Today, she reported feeling relatively well with no new or acute complaints.   General: Well developed, in no acute distress.  Neck: No JVD.  Cardiac: Bradycardic, regular rhythm.  Resp: Normal work of breathing.  Ext: No edema.  Neuro: No gross focal deficits.  Psych: Normal affect.   Cr: 1.3 K: 3.6   EKG: Ectopic atrial rhythm with RBBB, Qtc stable at ~470ms   Assessment: Paroxysmal atrial fibrillation Secondary hypercoagulable state due to atrial  fibrillation High risk medication use   Plan:  - Continue Tikosyn 500 mcg BID.  - Continue Eliquis  5mg  BID. - Follow up in 1 week.   Duration of Discharge Encounter: 45 minutes  Fonda Kitty, MD 03/15/2024 9:17 PM

## 2024-03-15 NOTE — Progress Notes (Signed)
 EKG from yesterday evening 03/14/2024 reviewed     Shows remains in NSR with stable QTc at 466 ms.  Continue  Tikosyn 500 mcg BID.   Potassium3.6 (06/26 0420) Magnesium  1.8 (06/26 0420) Creatinine, ser  1.31* (06/26 0420)  Replace K+/Mg+ > 40 mEq KCL and 2gm Mg+   Plan for home Thursday if QTc remains stable   Daphne Barrack, NP-C, AGACNP-BC [facility] - Electrophysiology  03/15/2024, 6:51 AM

## 2024-03-16 LAB — GLUCOSE, CAPILLARY: Glucose-Capillary: 166 mg/dL — ABNORMAL HIGH (ref 70–99)

## 2024-03-16 NOTE — Progress Notes (Signed)
  Progress Note   Date: 03/15/2024  Patient Name: Kathryn Merritt        MRN#: [id number]  Review the patient's clinical findings supports the diagnosis of:  Diabetes mellitus type 2 with hyperglycemia

## 2024-03-16 NOTE — Progress Notes (Signed)
 Patient called regarding addition of AVS information for Tikosyn  and reviewed instructions with her for medication > specifically to check with her pharmacist if she ever needs OTC or new prescription medications. Patient indicates understanding.     Follow up appts arranged.     Daphne Barrack, NP-C, AGACNP-BC [facility] - Electrophysiology  03/16/2024, 11:26 AM

## 2024-03-16 NOTE — Discharge Instructions (Signed)
 Dofetilide Capsules What is this medication? DOFETILIDE (doe FET il ide) treats a fast or irregular heartbeat (arrhythmia). It works by slowing down overactive electric signals in the heart, which stabilizes your heart rhythm. It belongs to a group of medications called antiarrhythmics. This medicine may be used for other purposes; ask your health care provider or pharmacist if you have questions. COMMON BRAND NAME(S): Tikosyn What should I tell my care team before I take this medication? They need to know if you have any of these conditions: Heart disease History of irregular heartbeat History of low levels of potassium or magnesium in the blood Kidney disease Liver disease An unusual or allergic reaction to dofetilide, other medications, foods, dyes, or preservatives Pregnant or trying to get pregnant Breast-feeding How should I use this medication? Take this medication by mouth with a glass of water. Follow the directions on the prescription label. Do not take with grapefruit juice. You can take it with or without food. If it upsets your stomach, take it with food. Take your medication at regular intervals. Do not take it more often than directed. Do not stop taking except on your care team's advice. A special MedGuide will be given to you by the pharmacist with each prescription and refill. Be sure to read this information carefully each time. Talk to your care team about the use of this medication in children. Special care may be needed. Overdosage: If you think you have taken too much of this medicine contact a poison control center or emergency room at once. NOTE: This medicine is only for you. Do not share this medicine with others. What if I miss a dose? If you miss a dose, skip it. Take your next dose at the normal time. Do not take extra or 2 doses at the same time to make up for the missed dose. What may interact with this medication? Do not take this medication with any of the  following: Benadryl (Diphenhydramine) Cimetidine Cisapride Dolutegravir Dronedarone Erdafitinib Hydrochlorothiazide Immodium Ketoconazole Megestrol Pimozide Prochlorperazine Thioridazine Trimethoprim Verapamil This medication may also interact with the following: Amiloride Cannabinoids Certain antibiotics like erythromycin or clarithromycin Certain antiviral medications for HIV or hepatitis Certain medications for depression, anxiety, or psychotic disorders Digoxin Diltiazem Grapefruit juice Metformin Nefazodone Other medications that prolong the QT interval (an abnormal heart rhythm) Quinine Triamterene Zafirlukast Ziprasidone This list may not describe all possible interactions. Give your health care provider a list of all the medicines, herbs, non-prescription drugs, or dietary supplements you use. Also tell them if you smoke, drink alcohol, or use illegal drugs. Some items may interact with your medicine. What should I watch for while using this medication? Your condition will be monitored carefully while you are receiving this medication. What side effects may I notice from receiving this medication? Side effects that you should report to your care team as soon as possible: Allergic reactions--skin rash, itching, hives, swelling of the face, lips, tongue, or throat Chest pain Heart rhythm changes--fast or irregular heartbeat, dizziness, feeling faint or lightheaded, chest pain, trouble breathing Side effects that usually do not require medical attention (report to your care team if they continue or are bothersome): Dizziness Headache Nausea Stomach pain Trouble sleeping This list may not describe all possible side effects. Call your doctor for medical advice about side effects. You may report side effects to FDA at 1-800-FDA-1088. Where should I keep my medication? Keep out of the reach of children. Store at room temperature between 15 and 30 degrees  C (59 and 86  degrees F). Throw away any unused medication after the expiration date. NOTE: This sheet is a summary. It may not cover all possible information. If you have questions about this medicine, talk to your doctor, pharmacist, or health care provider.  2024 Elsevier/Gold Standard (2021-08-07 00:00:00)

## 2024-03-22 ENCOUNTER — Ambulatory Visit (HOSPITAL_COMMUNITY): Admitting: Physician Assistant

## 2024-03-22 ENCOUNTER — Other Ambulatory Visit (HOSPITAL_COMMUNITY): Payer: Self-pay

## 2024-03-22 ENCOUNTER — Ambulatory Visit (HOSPITAL_COMMUNITY)
Admission: RE | Admit: 2024-03-22 | Discharge: 2024-03-22 | Disposition: A | Discharge status: Home / Self Care | Source: Ambulatory Visit | Attending: Physician Assistant | Admitting: Physician Assistant

## 2024-03-22 ENCOUNTER — Encounter (HOSPITAL_COMMUNITY): Payer: Self-pay | Admitting: Physician Assistant

## 2024-03-22 VITALS — BP 116/74 | HR 49 | Ht 63.0 in | Wt 257.8 lb

## 2024-03-22 DIAGNOSIS — Z5181 Encounter for therapeutic drug level monitoring: Secondary | ICD-10-CM

## 2024-03-22 DIAGNOSIS — D6869 Other thrombophilia: Secondary | ICD-10-CM

## 2024-03-22 DIAGNOSIS — I480 Paroxysmal atrial fibrillation: Secondary | ICD-10-CM | POA: Diagnosis not present

## 2024-03-22 DIAGNOSIS — Z79899 Other long term (current) drug therapy: Secondary | ICD-10-CM

## 2024-03-22 MED ORDER — DOFETILIDE 500 MCG PO CAPS
500.0000 ug | ORAL_CAPSULE | Freq: Two times a day (BID) | ORAL | 3 refills | Status: AC
  Filled 2024-03-22: qty 180, 90d supply, fill #0

## 2024-03-22 NOTE — Progress Notes (Signed)
 Primary Care Physician: [name], FNP Primary Cardiologist: Lynwood Schilling, MD Electrophysiologist: Fonda Kitty, MD  Referring Physician: [facility]    Kathryn Merritt is a 63 y.o. female with a history of T2DM, moderate OSA, HLD, aortic atherosclerosis on CT, GERD, RBBB, HTN, and atrial fibrillation who presents for follow up in the [facility]. The patient was initially diagnosed with atrial fibrillation in 2022. Recently seen in ED on 03/22/23 due to Afib with RVR and underwent successful DCCV. Patient is on Eliquis  5 mg BID for stroke prevention. She had recurrence of her afib and was seen at the ED again 11/23/23 and underwent DCCV. She was seen by Dr Kitty who recommended dofetilide .   Patient returns for follow up for atrial fibrillation and dofetilide  monitoring. She is s/p dofetilide  loading 6/23-6/26/25. She remains in SR today and feels well. No bleeding issues on anticoagulation.   Today, she  denies symptoms of palpitations, chest pain, shortness of breath, orthopnea, PND, lower extremity edema, dizziness, presyncope, syncope, bleeding, or neurologic sequela. The patient is tolerating medications without difficulties and is otherwise without complaint today.    Atrial Fibrillation Risk Factors:  she does have diagnosis of sleep apnea.   she has a BMI of Body mass index is 45.67 kg/m.SABRA Filed Weights   03/22/24 0816  Weight: 116.9 kg    Current Outpatient Medications  Medication Sig Dispense Refill   acetaminophen  (TYLENOL ) 500 MG tablet Take 1,000 mg by mouth every 8 (eight) hours as needed for moderate pain.     albuterol  (VENTOLIN  HFA) 108 (90 Base) MCG/ACT inhaler Inhale 2 puffs into the lungs every 6 (six) hours as needed for wheezing or shortness of breath. 8.5 g 0   apixaban  (ELIQUIS ) 5 MG TABS tablet Take 1 tablet (5 mg total) by mouth 2 (two) times daily. 180 tablet 1   blood glucose meter kit and supplies Dispense based  on patient and insurance preference. Use up to four times daily as directed. (FOR ICD-10 E10.9, E11.9). 1 each 0   cetirizine  (ZYRTEC  ALLERGY) 10 MG tablet Take 1 tablet (10 mg total) by mouth daily. 30 tablet 0   glipiZIDE  (GLUCOTROL ) 5 MG tablet Take 1 tablet (5 mg total) by mouth 2 (two) times daily before a meal. 60 tablet 5   Insulin  Glargine (BASAGLAR  KWIKPEN) 100 UNIT/ML Inject 20 Units into the skin daily. 15 mL 3   Insulin  Pen Needle (PEN NEEDLES) 31G X 5 MM MISC 1 Needle by Does not apply route in the morning, at noon, in the evening, and at bedtime. 100 each 3   lisinopril  (ZESTRIL ) 20 MG tablet Take 1.5 tablets (30 mg total) by mouth daily. 90 tablet 1   lubiprostone  (AMITIZA ) 24 MCG capsule Take 1 capsule (24 mcg total) by mouth 2 (two) times daily. 180 capsule 0   nitrofurantoin , macrocrystal-monohydrate, (MACROBID ) 100 MG capsule Take 1 capsule (100 mg total) by mouth 2 (two) times daily. 1 po BId 14 capsule 0   pantoprazole  (PROTONIX ) 40 MG tablet Take 1 tablet (40 mg total) by mouth daily. 90 tablet 1   potassium chloride  SA (KLOR-CON  M) 20 MEQ tablet Take 1 tablet (20 mEq total) by mouth 2 (two) times daily. 10 tablet 0   rosuvastatin  (CRESTOR ) 10 MG tablet Take 1 tablet (10 mg total) by mouth daily. 90 tablet 1   torsemide  (DEMADEX ) 100 MG tablet Take 1 tablet (100 mg total) by mouth daily. 90 tablet 0  dofetilide  (TIKOSYN ) 500 MCG capsule Take 1 capsule (500 mcg total) by mouth 2 (two) times daily. 180 capsule 3   No current facility-administered medications for this encounter.    Atrial Fibrillation Management history:  Previous antiarrhythmic drugs: dofetilide   Previous cardioversions: 03/18/21, 11/20/2021, 03/22/23, 11/23/23 Previous ablations: None Anticoagulation history: Eliquis     ROS- All systems are reviewed and negative except as per the HPI above.  Physical Exam: BP 116/74   Pulse (!) 49   Ht 5' 3 (1.6 m)   Wt 116.9 kg   BMI 45.67 kg/m   GEN: Well  nourished, well developed in no acute distress CARDIAC: Regular rate and rhythm, no murmurs, rubs, gallops RESPIRATORY:  Clear to auscultation without rales, wheezing or rhonchi  ABDOMEN: Soft, non-tender, non-distended EXTREMITIES:  No edema; No deformity    EKG today demonstrates  SB, RBBB Vent. rate 49 BPM PR interval 140 ms QRS duration 134 ms QT/QTcB 514/464 ms   Echo 06/08/22 demonstrated   1. Left ventricular ejection fraction, by estimation, is 65 to 70%. The  left ventricle has normal function. The left ventricle has no regional  wall motion abnormalities. There is mild left ventricular hypertrophy.  Left ventricular diastolic parameters  are consistent with Grade I diastolic dysfunction (impaired relaxation).  Elevated left atrial pressure.   2. Right ventricular systolic function is normal. The right ventricular  size is normal. There is normal pulmonary artery systolic pressure.   3. Left atrial size was moderately dilated.   4. Right atrial size was mild to moderately dilated.   5. The mitral valve is normal in structure. No evidence of mitral valve  regurgitation. No evidence of mitral stenosis.   6. The tricuspid valve is abnormal.   7. The aortic valve is tricuspid. Aortic valve regurgitation is not  visualized. No aortic stenosis is present.   8. The inferior vena cava is normal in size with greater than 50%  respiratory variability, suggesting right atrial pressure of 3 mmHg.    CHA2DS2-VASc Score = 4  The patient's score is based upon: CHF History: 0 HTN History: 1 Diabetes History: 1 Stroke History: 0 Vascular Disease History: 1 Age Score: 0 Gender Score: 1       ASSESSMENT AND PLAN: Paroxysmal Atrial Fibrillation (ICD10:  I48.0) The patient's CHA2DS2-VASc score is 4, indicating a 4.8% annual risk of stroke.   S/p dofetilide  loading 02/2024 Patient appears to be maintaining SR Continue dofetilide  500 mcg BID Continue Eliquis  5 mg BID  Secondary  Hypercoagulable State (ICD10:  D68.69) The patient is at significant risk for stroke/thromboembolism based upon her CHA2DS2-VASc Score of 4.  Continue Apixaban  (Eliquis ). No bleeding issues.   High Risk Medication Monitoring (ICD 10: Z79.899) QT interval on ECG acceptable for dofetilide  monitoring. Check bmet/mag today.     OSA  Encouraged nightly CPAP  Obesity Body mass index is 45.67 kg/m.  Encouraged lifestyle modification  HTN Stable on current regimen   Follow up in the AF clinic in one month.     Daril Kicks PA-C [facility] [address] [place], [place] [address] [phone]

## 2024-03-23 LAB — BASIC METABOLIC PANEL WITH GFR
BUN/Creatinine Ratio: 24 (ref 12–28)
BUN: 27 mg/dL (ref 8–27)
CO2: 18 mmol/L — ABNORMAL LOW (ref 20–29)
Calcium: 9.7 mg/dL (ref 8.7–10.3)
Chloride: 109 mmol/L — ABNORMAL HIGH (ref 96–106)
Creatinine, Ser: 1.12 mg/dL — ABNORMAL HIGH (ref 0.57–1.00)
Glucose: 246 mg/dL — ABNORMAL HIGH (ref 70–99)
Potassium: 4.3 mmol/L (ref 3.5–5.2)
Sodium: 142 mmol/L (ref 134–144)
eGFR: 56 mL/min/1.73 — ABNORMAL LOW (ref >59)

## 2024-03-23 LAB — MAGNESIUM: Magnesium: 2 mg/dL (ref 1.6–2.3)

## 2024-03-26 ENCOUNTER — Ambulatory Visit (HOSPITAL_COMMUNITY): Payer: Self-pay | Admitting: Physician Assistant

## 2024-04-02 ENCOUNTER — Other Ambulatory Visit: Payer: Self-pay | Admitting: Nurse Practitioner

## 2024-04-02 ENCOUNTER — Ambulatory Visit: Payer: Self-pay | Admitting: *Deleted

## 2024-04-02 DIAGNOSIS — I272 Pulmonary hypertension, unspecified: Secondary | ICD-10-CM | POA: Insufficient documentation

## 2024-04-02 DIAGNOSIS — J40 Bronchitis, not specified as acute or chronic: Secondary | ICD-10-CM

## 2024-04-02 DIAGNOSIS — M1990 Unspecified osteoarthritis, unspecified site: Secondary | ICD-10-CM | POA: Insufficient documentation

## 2024-04-02 DIAGNOSIS — N840 Polyp of corpus uteri: Secondary | ICD-10-CM | POA: Insufficient documentation

## 2024-04-02 DIAGNOSIS — F419 Anxiety disorder, unspecified: Secondary | ICD-10-CM | POA: Insufficient documentation

## 2024-04-02 DIAGNOSIS — M722 Plantar fascial fibromatosis: Secondary | ICD-10-CM | POA: Insufficient documentation

## 2024-04-02 DIAGNOSIS — F32A Depression, unspecified: Secondary | ICD-10-CM | POA: Insufficient documentation

## 2024-04-02 DIAGNOSIS — M7730 Calcaneal spur, unspecified foot: Secondary | ICD-10-CM | POA: Insufficient documentation

## 2024-04-02 NOTE — Telephone Encounter (Signed)
 Called and scheduled patient for a Mychart video visit for tomorrow. Patient aware

## 2024-04-02 NOTE — Telephone Encounter (Signed)
 Copied from CRM [id number]. Topic: Clinical - Red Word Triage >> Apr 02, 2024  9:40 AM Carrielelia G wrote: [facility] that prompted transfer to Nurse Triage: severe back pain, right buttocks pain Reason for Disposition  [1] MODERATE back pain (e.g., interferes with normal activities) AND [2] present > 3 days  Answer Assessment - Initial Assessment Questions 1. ONSET: When did the pain begin? (e.g., minutes, hours, days)     I'm having lower back pain going into my butt cheek on right side.    2. LOCATION: Where does it hurt? (upper, mid or lower back)     Lower back going into right butt cheek.   3. SEVERITY: How bad is the pain?  (e.g., Scale 1-10; mild, moderate, or severe)     It's bad pain.   Started towards end of last week and getting worse. 4. PATTERN: Is the pain constant? (e.g., yes, no; constant, intermittent)      Constant 5. RADIATION: Does the pain shoot into your legs or somewhere else?     No     Just my butt cheek.   It's hard to walk and sit. 6. CAUSE:  What do you think is causing the back pain?      Sciatica.    7. BACK OVERUSE:  Any recent lifting of heavy objects, strenuous work or exercise?     No 8. MEDICINES: What have you taken so far for the pain? (e.g., nothing, acetaminophen , NSAIDS)     I'm limited in what I can take for pain due to all the medications I'm on. 9. NEUROLOGIC SYMPTOMS: Do you have any weakness, numbness, or problems with bowel/bladder control?     No 10. OTHER SYMPTOMS: Do you have any other symptoms? (e.g., fever, abdomen pain, burning with urination, blood in urine)       No 11. PREGNANCY: Is there any chance you are pregnant? When was your last menstrual period?       N/A due to age  Protocols used: Back Pain-A-AH FYI Only or Action Required?: Action required by provider: request for appointment.  Patient was last seen in primary care on 03/08/2024 by [name], FNP.  Called Nurse Triage reporting Back  Pain.  Symptoms began a week ago.  Interventions attempted: Nothing.  Symptoms are: gradually worsening.  Triage Disposition: See PCP When Office is Open (Within 3 Days)  Patient/caregiver understands and will follow disposition?: No Pt can only come in today due to her work schedule.  No openings today with any of the providers including the doctor of the day.   Pt. Decided against scheduling another day so thanked me for my help. Message sent to see if pt could possibly be worked in for today.

## 2024-04-02 NOTE — Progress Notes (Deleted)
 Name: Kathryn Merritt DOB: [birth year] MRN: [id number]  History of Present Illness: Ms. Kathryn Merritt is a 63 y.o. female who presents today as a new patient at [facility]. All available relevant medical records have been reviewed.  Relevant History includes: 1. Kidney stone(s). She {Actions; denies-reports:120008} prior history of kidney stone procedure(s) ***including ***ESWL ***ureteroscopic stone manipulation ***PCNL.  Per chart review: > 11/29/2022: CT abdomen/pelvis w/ contrast showed tiny 2 mm bilateral non-obstructing kidney stones.   > 02/29/2024:  - Seen at urgent care for right flank pain x2 days. UA showed trace blood, protein, and glucose Urine culture positive for E. Coli  She was discharged with prescriptions for Flomax .   > 03/08/2024:  Urine culture negative  > 03/22/2024: Creatinine 1.12, GFR 56.  Today: She {Actions; denies-reports:120008} suspected stone migration / passage. She {Actions; denies-reports:120008} flank pain or abdominal pain. She {Actions; denies-reports:120008} fevers, nausea, or vomiting.  She {Actions; denies-reports:120008} increased urinary urgency, frequency, nocturia, dysuria, gross hematuria, hesitancy, straining to void, or sensations of incomplete emptying.  Medications: Current Outpatient Medications  Medication Sig Dispense Refill   acetaminophen  (TYLENOL ) 500 MG tablet Take 1,000 mg by mouth every 8 (eight) hours as needed for moderate pain.     albuterol  (VENTOLIN  HFA) 108 (90 Base) MCG/ACT inhaler Inhale 2 puffs into the lungs every 6 (six) hours as needed for wheezing or shortness of breath. 8.5 g 1   apixaban  (ELIQUIS ) 5 MG TABS tablet Take 1 tablet (5 mg total) by mouth 2 (two) times daily. 180 tablet 1   blood glucose meter kit and supplies Dispense based on patient and insurance preference. Use up to four times daily as directed. (FOR ICD-10 E10.9, E11.9). 1 each 0   cetirizine  (ZYRTEC  ALLERGY) 10 MG tablet Take 1  tablet (10 mg total) by mouth daily. 30 tablet 0   dofetilide  (TIKOSYN ) 500 MCG capsule Take 1 capsule (500 mcg total) by mouth 2 (two) times daily. 180 capsule 3   glipiZIDE  (GLUCOTROL ) 5 MG tablet Take 1 tablet (5 mg total) by mouth 2 (two) times daily before a meal. 60 tablet 5   Insulin  Glargine (BASAGLAR  KWIKPEN) 100 UNIT/ML Inject 20 Units into the skin daily. 15 mL 3   Insulin  Pen Needle (PEN NEEDLES) 31G X 5 MM MISC 1 Needle by Does not apply route in the morning, at noon, in the evening, and at bedtime. 100 each 3   lisinopril  (ZESTRIL ) 20 MG tablet Take 1.5 tablets (30 mg total) by mouth daily. 90 tablet 1   lubiprostone  (AMITIZA ) 24 MCG capsule Take 1 capsule (24 mcg total) by mouth 2 (two) times daily. 180 capsule 0   nitrofurantoin , macrocrystal-monohydrate, (MACROBID ) 100 MG capsule Take 1 capsule (100 mg total) by mouth 2 (two) times daily. 1 po BId 14 capsule 0   pantoprazole  (PROTONIX ) 40 MG tablet Take 1 tablet (40 mg total) by mouth daily. 90 tablet 1   potassium chloride  SA (KLOR-CON  M) 20 MEQ tablet Take 1 tablet (20 mEq total) by mouth 2 (two) times daily. 10 tablet 0   rosuvastatin  (CRESTOR ) 10 MG tablet Take 1 tablet (10 mg total) by mouth daily. 90 tablet 1   torsemide  (DEMADEX ) 100 MG tablet Take 1 tablet (100 mg total) by mouth daily. 90 tablet 0   No current facility-administered medications for this visit.    Allergies: Allergies  Allergen Reactions   Metformin  And Related     Made patient feel bad/GI    Past Medical History:  Diagnosis Date   Chronic back pain    Diabetes mellitus    Edema leg    Hyperlipidemia    Hypertension    Joint pain    Kidney stones    Other malaise and fatigue    PAF (paroxysmal atrial fibrillation) (HCC)    a. new diagnosis in 02/2021 --> s/p DCCV on 03/18/2021   RBBB    Sinus bradycardia    Sleep apnea    Past Surgical History:  Procedure Laterality Date   BALLOON DILATION N/A 02/22/2022   Procedure: BALLOON DILATION;   Surgeon: Cindie Carlin POUR, DO;  Location: [facility];  Service: Endoscopy;  Laterality: N/A;   BIOPSY  02/22/2022   Procedure: BIOPSY;  Surgeon: Cindie Carlin POUR, DO;  Location: [facility];  Service: Endoscopy;;   CARDIOVERSION N/A 03/18/2021   Procedure: CARDIOVERSION;  Surgeon: Debera Jayson MATSU, MD;  Location: [facility];  Service: Cardiovascular;  Laterality: N/A;   CESAREAN SECTION     times 2   COLONOSCOPY WITH PROPOFOL  N/A 02/22/2022   Procedure: COLONOSCOPY WITH PROPOFOL ;  Surgeon: Cindie Carlin POUR, DO;  Location: [facility];  Service: Endoscopy;  Laterality: N/A;  7:30am   CORONARY ANGIOGRAPHY N/A 06/09/2022   Procedure: CORONARY ANGIOGRAPHY;  Surgeon: Wendel Lurena POUR, MD;  Location: [facility];  Service: Cardiovascular;  Laterality: N/A;   ESOPHAGOGASTRODUODENOSCOPY (EGD) WITH PROPOFOL  N/A 02/22/2022   Procedure: ESOPHAGOGASTRODUODENOSCOPY (EGD) WITH PROPOFOL ;  Surgeon: Cindie Carlin POUR, DO;  Location: [facility];  Service: Endoscopy;  Laterality: N/A;   INCISION AND DRAINAGE ABSCESS Right    axilla   LITHOTRIPSY     POLYPECTOMY  02/22/2022   Procedure: POLYPECTOMY;  Surgeon: Cindie Carlin POUR, DO;  Location: [facility];  Service: Endoscopy;;   Family History  Problem Relation Age of Onset   Hypertension Mother    Cancer Father    Hypertension Father    Cancer Brother    Heart disease Paternal Grandmother    Breast cancer Neg Hx    Colon cancer Neg Hx    Social History   Socioeconomic History   Marital status: Married    Spouse name: Not on file   Number of children: 2   Years of education: Not on file   Highest education level: 12th grade  Occupational History   Occupation: Engineer, maintenance (IT)- Teacher  Tobacco Use   Smoking status: Never   Smokeless tobacco: Never   Tobacco comments:    Never smoked 03/12/24  Vaping Use   Vaping status: Never Used  Substance and Sexual Activity   Alcohol use: No   Drug use: No   Sexual activity: Not  Currently  Other Topics Concern   Not on file  Social History Narrative   Live with her husband, works at [employer] center,    Social Drivers of Health   Financial Resource Strain: Low Risk  (03/07/2024)   Overall Financial Resource Strain (CARDIA)    Difficulty of Paying Living Expenses: Not very hard  Food Insecurity: No Food Insecurity (03/12/2024)   Hunger Vital Sign    Worried About Running Out of Food in the Last Year: Never true    Ran Out of Food in the Last Year: Never true  Transportation Needs: No Transportation Needs (03/12/2024)   PRAPARE - Administrator, Civil Service (Medical): No    Lack of Transportation (Non-Medical): No  Physical Activity: Inactive (03/07/2024)   Exercise Vital Sign  Days of Exercise per Week: 0 days    Minutes of Exercise per Session: Not on file  Stress: No Stress Concern Present (03/07/2024)   [employer] of Occupational Health - Occupational Stress Questionnaire    Feeling of Stress: Not at all  Social Connections: Moderately Isolated (03/07/2024)   Social Connection and Isolation Panel    Frequency of Communication with Friends and Family: Three times a week    Frequency of Social Gatherings with Friends and Family: Once a week    Attends Religious Services: Never    Database administrator or Organizations: No    Attends Engineer, structural: Not on file    Marital Status: Married  Catering manager Violence: Not At Risk (03/12/2024)   Humiliation, Afraid, Rape, and Kick questionnaire    Fear of Current or Ex-Partner: No    Emotionally Abused: No    Physically Abused: No    Sexually Abused: No    SUBJECTIVE  Review of Systems Constitutional: Patient denies any unintentional weight loss or change in strength lntegumentary: Patient denies any rashes or pruritus Cardiovascular: Patient denies chest pain or syncope Respiratory: Patient denies shortness of breath Gastrointestinal: ***Patient denies  nausea, vomiting, constipation, or diarrhea ***As per HPI Musculoskeletal: Patient denies muscle cramps or weakness Neurologic: Patient denies convulsions or seizures Allergic/Immunologic: Patient denies recent allergic reaction(s) Hematologic/Lymphatic: Patient denies bleeding tendencies Endocrine: Patient denies heat/cold intolerance  GU: As per HPI.  OBJECTIVE There were no vitals filed for this visit. There is no height or weight on file to calculate BMI.  Physical Examination Constitutional: No obvious distress; patient is non-toxic appearing  Cardiovascular: No visible lower extremity edema.  Respiratory: The patient does not have audible wheezing/stridor; respirations do not appear labored  Gastrointestinal: Abdomen non-distended Musculoskeletal: Normal ROM of UEs  Skin: No obvious rashes/open sores  Neurologic: CN 2-12 grossly intact Psychiatric: Answered questions appropriately with normal affect  Hematologic/Lymphatic/Immunologic: No obvious bruises or sites of spontaneous bleeding  UA: ***negative ***positive for *** leukocytes, *** blood, ***nitrites Urine microscopy: *** WBC/hpf, *** RBC/hpf, *** bacteria ***glucosuria (secondary to ***Jardiance ***Farxiga  use) ***otherwise unremarkable  PVR: *** ml  ASSESSMENT No diagnosis found.  ***We reviewed recent imaging results; ***awaiting radiology results, appears to have ***no acute findings.  For acute GU stone symptoms we agreed to proceed with: - ***RUS *** KUB ***CT stone study - ***BMP ***CMP  - ***Flomax  0.4 mg daily for medical expulsive therapy (MET) - For pain management, we discussed the use of OTC analgesics versus opioids ***as prescribed at ER ***A 5 day prescription was sent for ***Percocet for PRN use for severe pain.  - For nausea / vomiting: ***Zofran  / ***Phenergan  PRN ***prescription sent ***as prescribed at ER   We discussed the various treatment options including  ***medical expulsive therapy  (MET) ***extracorporeal shock wave lithotripsy (ESWL) ***ureteroscopic stone manipulation (URS) ***percutaneous nephrolithotomy (PCNL) We discussed possible risks and benefits of intervention including but not limited to: including pain, infection, sepsis, UTI, ureter perforation, need for stenting, post-op ureteral stricture, hematuria, possible need for follow up procedures.  She elected to proceed with ***MET ***ESWL ***URS ***PCNL ; surgery order placed.   ***Will consult with ***Dr. Sherrilee and notify patient of his recommendations ***for next steps / ***following review of imaging results.  Will plan to follow up in ***2 weeks ***6 months with ***KUB ***RUS for stone surveillance or sooner if needed.   She was advised to contact urology provider or go to the ER if  She develops fever >101F, uncontrollable pain, or other significantly concerning symptoms prior to follow up.  Patient verbalized understanding and agreement. All questions were answered.   PLAN Advised the following: ***Flomax  (Tamsulosin ) 0.4 mg daily. ***Analgesics PRN for pain. ***Zofran  PRN for nausea. ***No follow-ups on file.  No orders of the defined types were placed in this encounter.   It has been explained that the patient is to follow regularly with their PCP in addition to all other providers involved in their care and to follow instructions provided by these respective offices. Patient advised to contact urology clinic if any urologic-pertaining questions, concerns, new symptoms or problems arise in the interim period.  There are no Patient Instructions on file for this visit.  Electronically signed by: Lauraine KYM Oz, MSN, FNP-C, CUNP 04/02/2024 2:46 PM

## 2024-04-03 ENCOUNTER — Ambulatory Visit: Admitting: Urology

## 2024-04-03 ENCOUNTER — Telehealth (INDEPENDENT_AMBULATORY_CARE_PROVIDER_SITE_OTHER): Admitting: Nurse Practitioner

## 2024-04-03 ENCOUNTER — Encounter: Payer: Self-pay | Admitting: Nurse Practitioner

## 2024-04-03 DIAGNOSIS — Z87440 Personal history of urinary (tract) infections: Secondary | ICD-10-CM

## 2024-04-03 DIAGNOSIS — R109 Unspecified abdominal pain: Secondary | ICD-10-CM

## 2024-04-03 DIAGNOSIS — M5431 Sciatica, right side: Secondary | ICD-10-CM | POA: Diagnosis not present

## 2024-04-03 DIAGNOSIS — N200 Calculus of kidney: Secondary | ICD-10-CM

## 2024-04-03 MED ORDER — PREDNISONE 10 MG (21) PO TBPK: ORAL_TABLET | ORAL | 0 refills | Status: DC

## 2024-04-03 NOTE — Progress Notes (Signed)
 Virtual Visit Consent   Kathryn Merritt, you are scheduled for a virtual visit with Mary-Margaret Gladis, FNP, a [facility] provider, today.     Just as with appointments in the office, your consent must be obtained to participate.  Your consent will be active for this visit and any virtual visit you may have with one of our providers in the next 365 days.     If you have a MyChart account, a copy of this consent can be sent to you electronically.  All virtual visits are billed to your insurance company just like a traditional visit in the office.    As this is a virtual visit, video technology does not allow for your provider to perform a traditional examination.  This may limit your provider's ability to fully assess your condition.  If your provider identifies any concerns that need to be evaluated in person or the need to arrange testing (such as labs, EKG, etc.), we will make arrangements to do so.     Although advances in technology are sophisticated, we cannot ensure that it will always work on either your end or our end.  If the connection with a video visit is poor, the visit may have to be switched to a telephone visit.  With either a video or telephone visit, we are not always able to ensure that we have a secure connection.     I need to obtain your verbal consent now.   Are you willing to proceed with your visit today? YES   Kathryn Merritt has provided verbal consent on 04/03/2024 for a virtual visit (video or telephone).   Mary-Margaret Gladis, FNP   Date: 04/03/2024 11:59 AM   Virtual Visit via Video Note   I, Mary-Margaret Gladis, connected with Kathryn Merritt ([id number], 17-Oct-1960) on 04/03/24 at 12:05 PM EDT by a video-enabled telemedicine application and verified that I am speaking with the correct person using two identifiers.  Location: Patient: Virtual Visit Location Patient: Home Provider: Virtual Visit Location Provider: Mobile   I discussed the limitations of  evaluation and management by telemedicine and the availability of in person appointments. The patient expressed understanding and agreed to proceed.    History of Present Illness: Kathryn Merritt is a 63 y.o. who identifies as a female who was assigned female at birth, and is being seen today for on going sciatica.  HPI: Patient still c/o back pain that radiates down her right leg. Rates pain 2/10. Tylenol  helps bring pain down from 7-8/10. Sitting and laying increase pain.    Review of Systems  Constitutional:  Negative for diaphoresis and weight loss.  Eyes:  Negative for blurred vision, double vision and pain.  Respiratory:  Negative for shortness of breath.   Cardiovascular:  Negative for chest pain, palpitations, orthopnea and leg swelling.  Gastrointestinal:  Negative for abdominal pain.  Skin:  Negative for rash.  Neurological:  Negative for dizziness, sensory change, loss of consciousness, weakness and headaches.  Endo/Heme/Allergies:  Negative for polydipsia. Does not bruise/bleed easily.  Psychiatric/Behavioral:  Negative for memory loss. The patient does not have insomnia.   All other systems reviewed and are negative.   Problems:  Patient Active Problem List   Diagnosis Date Noted   Anxiety 04/02/2024   Depression 04/02/2024   Heel spur 04/02/2024   Osteoarthritis 04/02/2024   Plantar fasciitis of left foot 04/02/2024   Pulmonary HTN (HCC) 04/02/2024   Uterine polyp 04/02/2024   Palpitations 02/02/2024  Diabetes mellitus treated with injections of non-insulin  medication (HCC) 08/01/2023   Hypercoagulable state due to paroxysmal atrial fibrillation (HCC) 03/29/2023   Lesion of left ear 06/24/2022   Vitamin D  deficiency 01/21/2022   Stage 3a chronic kidney disease (HCC) 01/21/2022   Constipation due to outlet dysfunction 01/13/2022   Esophageal dysphagia 01/13/2022   Subclinical hypothyroidism 10/23/2021   Gastroesophageal reflux disease 07/16/2021   OSA  (obstructive sleep apnea) 07/14/2021   Hypertension 06/18/2021   Hyperlipidemia associated with type 2 diabetes mellitus (HCC) 06/18/2021   Paroxysmal atrial fibrillation (HCC) 03/18/2021   Morbid obesity (HCC) 03/18/2021   Controlled type 2 diabetes mellitus without complication, without long-term current use of insulin  (HCC) 03/18/2021    Allergies:  Allergies  Allergen Reactions   Metformin  And Related     Made patient feel bad/GI   Medications:  Current Outpatient Medications:    acetaminophen  (TYLENOL ) 500 MG tablet, Take 1,000 mg by mouth every 8 (eight) hours as needed for moderate pain., Disp: , Rfl:    albuterol  (VENTOLIN  HFA) 108 (90 Base) MCG/ACT inhaler, Inhale 2 puffs into the lungs every 6 (six) hours as needed for wheezing or shortness of breath., Disp: 8.5 g, Rfl: 1   apixaban  (ELIQUIS ) 5 MG TABS tablet, Take 1 tablet (5 mg total) by mouth 2 (two) times daily., Disp: 180 tablet, Rfl: 1   blood glucose meter kit and supplies, Dispense based on patient and insurance preference. Use up to four times daily as directed. (FOR ICD-10 E10.9, E11.9)., Disp: 1 each, Rfl: 0   cetirizine  (ZYRTEC  ALLERGY) 10 MG tablet, Take 1 tablet (10 mg total) by mouth daily., Disp: 30 tablet, Rfl: 0   dofetilide  (TIKOSYN ) 500 MCG capsule, Take 1 capsule (500 mcg total) by mouth 2 (two) times daily., Disp: 180 capsule, Rfl: 3   glipiZIDE  (GLUCOTROL ) 5 MG tablet, Take 1 tablet (5 mg total) by mouth 2 (two) times daily before a meal., Disp: 60 tablet, Rfl: 5   Insulin  Glargine (BASAGLAR  KWIKPEN) 100 UNIT/ML, Inject 20 Units into the skin daily., Disp: 15 mL, Rfl: 3   Insulin  Pen Needle (PEN NEEDLES) 31G X 5 MM MISC, 1 Needle by Does not apply route in the morning, at noon, in the evening, and at bedtime., Disp: 100 each, Rfl: 3   lisinopril  (ZESTRIL ) 20 MG tablet, Take 1.5 tablets (30 mg total) by mouth daily., Disp: 90 tablet, Rfl: 1   lubiprostone  (AMITIZA ) 24 MCG capsule, Take 1 capsule (24 mcg total)  by mouth 2 (two) times daily., Disp: 180 capsule, Rfl: 0   nitrofurantoin , macrocrystal-monohydrate, (MACROBID ) 100 MG capsule, Take 1 capsule (100 mg total) by mouth 2 (two) times daily. 1 po BId, Disp: 14 capsule, Rfl: 0   pantoprazole  (PROTONIX ) 40 MG tablet, Take 1 tablet (40 mg total) by mouth daily., Disp: 90 tablet, Rfl: 1   potassium chloride  SA (KLOR-CON  M) 20 MEQ tablet, Take 1 tablet (20 mEq total) by mouth 2 (two) times daily., Disp: 10 tablet, Rfl: 0   rosuvastatin  (CRESTOR ) 10 MG tablet, Take 1 tablet (10 mg total) by mouth daily., Disp: 90 tablet, Rfl: 1   torsemide  (DEMADEX ) 100 MG tablet, Take 1 tablet (100 mg total) by mouth daily., Disp: 90 tablet, Rfl: 0  Observations/Objective: Patient is well-developed, well-nourished in no acute distress.  Resting comfortably  at home.  Head is normocephalic, atraumatic.  No labored breathing.  Speech is clear and coherent with logical content.  Patient is alert and oriented at baseline.  Pain on flexion and extension oflower pback (-) SLR bil.  Assessment and Plan:  Kathryn Merritt in today with chief complaint of back pain with sciatica  1. Sciatica of right side (Primary) moist heat No heavy lifting Rest Back stretches Watch blood sugars closely while on steroids- will raise blood sugars - predniSONE  (STERAPRED UNI-PAK 21 TAB) 10 MG (21) TBPK tablet; As directed x 6 days  Dispense: 21 tablet; Refill: 0    Follow Up Instructions: I discussed the assessment and treatment plan with the patient. The patient was provided an opportunity to ask questions and all were answered. The patient agreed with the plan and demonstrated an understanding of the instructions.  A copy of instructions were sent to the patient via MyChart.  The patient was advised to call back or seek an in-person evaluation if the symptoms worsen or if the condition fails to improve as anticipated.  Time:  I spent 6 minutes with the patient via telehealth  technology discussing the above problems/concerns.    Mary-Margaret Gladis, FNP

## 2024-04-03 NOTE — Patient Instructions (Signed)
 Sciatica  Sciatica is pain, weakness, tingling, or loss of feeling (numbness) along the sciatic nerve. The sciatic nerve starts in the lower back and goes down the back of each leg. Sciatica usually affects one side of the body. Sciatica usually goes away on its own or with treatment. Sometimes, sciatica may come back. What are the causes? This condition happens when the sciatic nerve is pinched or has pressure put on it. This may be caused by: A disk in between the bones of the spine bulging out too far (herniated disk). Changes in the spinal disks due to aging. A condition that affects a muscle in the butt. Extra bone growth near the sciatic nerve. A break (fracture) of the area between your hip bones (pelvis). Pregnancy. Tumor. This is rare. What increases the risk? You are more likely to develop this condition if you: Play sports that put pressure or stress on the spine. Have poor strength and ease of movement (flexibility). Have had a back injury or back surgery. Sit for long periods of time. Do activities that involve bending or lifting over and over again. Are very overweight (obese). What are the signs or symptoms? Symptoms can vary from mild to very bad. They may include: Any of these problems in the lower back, leg, hip, or butt: Mild tingling, loss of feeling, or dull aches. A burning feeling. Sharp pains. Loss of feeling in the back of the calf or the sole of the foot. Leg weakness. Very bad back pain that makes it hard to move. These symptoms may get worse when you cough, sneeze, or laugh. They may also get worse when you sit or stand for long periods of time. How is this treated? This condition often gets better without any treatment. However, treatment may include: Changing or cutting back on physical activity when you have pain. Exercising, including strengthening and stretching. Putting ice or heat on the affected area. Shots of medicines to relieve pain and  swelling or to relax your muscles. Surgery. Follow these instructions at home: Medicines Take over-the-counter and prescription medicines only as told by your doctor. Ask your doctor if you should avoid driving or using machines while you are taking your medicine. Managing pain     If told, put ice on the affected area. To do this: Put ice in a plastic bag. Place a towel between your skin and the bag. Leave the ice on for 20 minutes, 2-3 times a day. If your skin turns bright red, take off the ice right away to prevent skin damage. The risk of skin damage is higher if you cannot feel pain, heat, or cold. If told, put heat on the affected area. Do this as often as told by your doctor. Use the heat source that your doctor tells you to use, such as a moist heat pack or a heating pad. Place a towel between your skin and the heat source. Leave the heat on for 20-30 minutes. If your skin turns bright red, take off the heat right away to prevent burns. The risk of burns is higher if you cannot feel pain, heat, or cold. Activity  Return to your normal activities when your doctor says that it is safe. Avoid activities that make your symptoms worse. Take short rests during the day. When you rest for a long time, do some physical activity or stretching between periods of rest. Avoid sitting for a long time without moving. Get up and move around at least one time each  hour. Do exercises and stretches as told by your doctor. Do not lift anything that is heavier than 10 lb (4.5 kg). Avoid lifting heavy things even when you do not have symptoms. Avoid lifting heavy things over and over. When you lift objects, always lift in a way that is safe for your body. To do this, you should: Bend your knees. Keep the object close to your body. Avoid twisting. General instructions Stay at a healthy weight. Wear comfortable shoes that support your feet. Avoid wearing high heels. Avoid sleeping on a mattress  that is too soft or too hard. You might have less pain if you sleep on a mattress that is firm enough to support your back. Contact a doctor if: Your pain is not controlled by medicine. Your pain does not get better. Your pain gets worse. Your pain lasts longer than 4 weeks. You lose weight without trying. Get help right away if: You cannot control when you pee (urinate) or poop (have a bowel movement). You have weakness in any of these areas and it gets worse: Lower back. The area between your hip bones. Butt. Legs. You have redness or swelling of your back. You have a burning feeling when you pee. Summary Sciatica is pain, weakness, tingling, or loss of feeling (numbness) along the sciatic nerve. This may include the lower back, legs, hips, and butt. This condition happens when the sciatic nerve is pinched or has pressure put on it. Treatment often includes rest, exercise, medicines, and putting ice or heat on the affected area. This information is not intended to replace advice given to you by your health care provider. Make sure you discuss any questions you have with your health care provider. Document Revised: 12/14/2021 Document Reviewed: 12/14/2021 Elsevier Patient Education  2024 [employer].

## 2024-04-17 ENCOUNTER — Ambulatory Visit: Admitting: Physician Assistant

## 2024-04-23 NOTE — Progress Notes (Deleted)
 Cardiology Office Note:   Date:  04/23/2024  ID:  Kathryn Merritt, DOB March 08, 1961, MRN [id number] PCP: [name], FNP  [facility] Providers Cardiologist:  Lynwood Schilling, MD Electrophysiologist:  Fonda Kitty, MD  Sleep Medicine:  Wilbert Bihari, MD {  History of Present Illness:   Kathryn Merritt is a 63 y.o. female who presents for she was admitted 02/2021 with new onset atrial fibrillation starting around 0200 day of admission. They could not exclude atypical atrial flutter per notes. She was started on IV diltiazem  but underwent DCCV 03/18/21 with successful conversion to NSR. She was started on Eliquis  and Toprol . She was also noted to have mildly elevated troponin values felt c/w demand ischemia (also had hypertensive urgency during admission). 2D echo 03/18/21 EF 65-70%, moderate LVH, normal diastolic parameters, normal RV, mild LAE.  She was in the ED in March 2023.  She had DCCV.    She was in the hospital with chest pain in Sept 2023 and and had no disease on cath.  EF was well preserved on echo.  She had DCCV in the ED in July 2024 and March 2025.  She was admitted in June 2025 for Tikosyn .    ***   ***She saw her primary provider a couple of days ago.  She was having more palpitations.  She denies any frequent atrial ectopy.  She had low blood pressures and her lisinopril  and amlodipine  have been held for the last couple of days.  Her blood pressures have gone up into the 120s  110s from being in the 90s.  Heart rate is low in the 50s and she says it has been apparently in the 30s at times.  She has not felt any of more of the tachypalpitations that she was having when she has her atrial fibrillation.  She does feel weak with her lower blood pressure and lower heart rate.  She is not having any new chest pressure, neck or arm discomfort.  She has some episodic sporadic chest discomfort.  She is not having any new breath, PND or orthopnea.    ROS: ***  Studies Reviewed:     EKG:       ***  Risk Assessment/Calculations:   {Does this patient have ATRIAL FIBRILLATION?:[phone]} No BP recorded.  {Refresh Note OR Click here to enter BP  :1}***        Physical Exam:   VS:  There were no vitals taken for this visit.   Wt Readings from Last 3 Encounters:  03/22/24 257 lb 12.8 oz (116.9 kg)  03/12/24 253 lb 12 oz (115.1 kg)  03/12/24 253 lb 12.8 oz (115.1 kg)     GEN: Well nourished, well developed in no acute distress NECK: No JVD; No carotid bruits CARDIAC: ***RR, *** murmurs, rubs, gallops RESPIRATORY:  Clear to auscultation without rales, wheezing or rhonchi  ABDOMEN: Soft, non-tender, non-distended EXTREMITIES:  No edema; No deformity   ASSESSMENT AND PLAN:   Paroxysmal atrial fibrillation:    ***  I am going to send her to EP to discuss the possibility of ablation although her untreated sleep apnea and her weight may preclude this as an option.  Managing her with antiarrhythmics I think would be somewhat problematic with her bradycardia arrhythmias.    Essential HTN:   Blood pressure is ***  low and I am going to hold her amlodipine  and lisinopril  and she will follow her blood pressures.   DM: A1c was *** 9.2.  This  is up from previous 6.5 and she has had continued and recent adjustment of her meds.  I will defer to her primary provider.  I reviewed these notes.    Morbid obesity: ***  I have discussed diet and given her a goal of a 10 pound weight loss in the next 3 months as a minimum.   Sleep apnea:   ***  She previously could not afford this but she is going to call back the company that supplies equipment and see if she can restart this.  She understands importance of sleep apnea management for control of her rhythm.     Follow up ***  Signed, Lynwood Schilling, MD

## 2024-04-25 ENCOUNTER — Ambulatory Visit: Admitting: Cardiology

## 2024-04-25 DIAGNOSIS — G4730 Sleep apnea, unspecified: Secondary | ICD-10-CM

## 2024-04-25 DIAGNOSIS — E118 Type 2 diabetes mellitus with unspecified complications: Secondary | ICD-10-CM

## 2024-04-25 DIAGNOSIS — I4819 Other persistent atrial fibrillation: Secondary | ICD-10-CM

## 2024-04-25 DIAGNOSIS — I10 Essential (primary) hypertension: Secondary | ICD-10-CM

## 2024-04-30 ENCOUNTER — Ambulatory Visit (HOSPITAL_COMMUNITY)
Admission: RE | Admit: 2024-04-30 | Discharge: 2024-04-30 | Disposition: A | Discharge status: Home / Self Care | Source: Ambulatory Visit | Attending: Physician Assistant | Admitting: Physician Assistant

## 2024-04-30 ENCOUNTER — Encounter (HOSPITAL_COMMUNITY): Payer: Self-pay | Admitting: Physician Assistant

## 2024-04-30 VITALS — BP 104/64 | HR 61 | Ht 63.0 in | Wt 255.6 lb

## 2024-04-30 DIAGNOSIS — Z79899 Other long term (current) drug therapy: Secondary | ICD-10-CM

## 2024-04-30 DIAGNOSIS — I480 Paroxysmal atrial fibrillation: Secondary | ICD-10-CM

## 2024-04-30 DIAGNOSIS — Z5181 Encounter for therapeutic drug level monitoring: Secondary | ICD-10-CM

## 2024-04-30 DIAGNOSIS — D6869 Other thrombophilia: Secondary | ICD-10-CM

## 2024-04-30 NOTE — Progress Notes (Signed)
 Primary Care Physician: [name], FNP Primary Cardiologist: Lynwood Schilling, MD Electrophysiologist: Fonda Kitty, MD  Referring Physician: [facility]    Verneita LITTIE Kathryn Merritt is a 63 y.o. female with a history of T2DM, moderate OSA, HLD, aortic atherosclerosis on CT, GERD, RBBB, HTN, and atrial fibrillation who presents for follow up in the [facility]. The patient was initially diagnosed with atrial fibrillation in 2022. Recently seen in ED on 03/22/23 due to Afib with RVR and underwent successful DCCV. Patient is on Eliquis  5 mg BID for stroke prevention. She had recurrence of her afib and was seen at the ED again 11/23/23 and underwent DCCV. She was seen by Dr Kitty who recommended dofetilide .   She is s/p dofetilide  loading 6/23-6/26/25.   Patient returns for follow up for atrial fibrillation and dofetilide  monitoring. She reports that she had one episode of elevated heart rates (80s bpm) but is unsure if this was afib. Her normal resting HR is in the 50s-60s. She does have a mild headache since starting dofetilide . No bleeding issues on anticoagulation.   Today, she  denies symptoms of palpitations, chest pain, shortness of breath, orthopnea, PND, lower extremity edema, dizziness, presyncope, syncope, bleeding, or neurologic sequela. The patient is tolerating medications without difficulties and is otherwise without complaint today.    Atrial Fibrillation Risk Factors:  she does have diagnosis of sleep apnea.   she has a BMI of Body mass index is 45.28 kg/m.SABRA Filed Weights   04/30/24 1517  Weight: 115.9 kg    Current Outpatient Medications  Medication Sig Dispense Refill   acetaminophen  (TYLENOL ) 500 MG tablet Take 1,000 mg by mouth every 8 (eight) hours as needed for moderate pain.     albuterol  (VENTOLIN  HFA) 108 (90 Base) MCG/ACT inhaler Inhale 2 puffs into the lungs every 6 (six) hours as needed for wheezing or shortness of breath. 8.5 g  1   apixaban  (ELIQUIS ) 5 MG TABS tablet Take 1 tablet (5 mg total) by mouth 2 (two) times daily. 180 tablet 1   blood glucose meter kit and supplies Dispense based on patient and insurance preference. Use up to four times daily as directed. (FOR ICD-10 E10.9, E11.9). 1 each 0   cetirizine  (ZYRTEC  ALLERGY) 10 MG tablet Take 1 tablet (10 mg total) by mouth daily. 30 tablet 0   dofetilide  (TIKOSYN ) 500 MCG capsule Take 1 capsule (500 mcg total) by mouth 2 (two) times daily. 180 capsule 3   glipiZIDE  (GLUCOTROL ) 5 MG tablet Take 1 tablet (5 mg total) by mouth 2 (two) times daily before a meal. 60 tablet 5   Insulin  Glargine (BASAGLAR  KWIKPEN) 100 UNIT/ML Inject 20 Units into the skin daily. 15 mL 3   Insulin  Pen Needle (PEN NEEDLES) 31G X 5 MM MISC 1 Needle by Does not apply route in the morning, at noon, in the evening, and at bedtime. 100 each 3   lisinopril  (ZESTRIL ) 20 MG tablet Take 1.5 tablets (30 mg total) by mouth daily. 90 tablet 1   lubiprostone  (AMITIZA ) 24 MCG capsule Take 1 capsule (24 mcg total) by mouth 2 (two) times daily. 180 capsule 0   nitrofurantoin , macrocrystal-monohydrate, (MACROBID ) 100 MG capsule Take 1 capsule (100 mg total) by mouth 2 (two) times daily. 1 po BId 14 capsule 0   pantoprazole  (PROTONIX ) 40 MG tablet Take 1 tablet (40 mg total) by mouth daily. 90 tablet 1   predniSONE  (STERAPRED UNI-PAK 21 TAB) 10 MG (21) TBPK tablet  As directed x 6 days 21 tablet 0   rosuvastatin  (CRESTOR ) 10 MG tablet Take 1 tablet (10 mg total) by mouth daily. 90 tablet 1   torsemide  (DEMADEX ) 100 MG tablet Take 1 tablet (100 mg total) by mouth daily. 90 tablet 0   potassium chloride  SA (KLOR-CON  M) 20 MEQ tablet Take 1 tablet (20 mEq total) by mouth 2 (two) times daily. (Patient not taking: Reported on 04/30/2024) 10 tablet 0   No current facility-administered medications for this encounter.    Atrial Fibrillation Management history:  Previous antiarrhythmic drugs: dofetilide   Previous  cardioversions: 03/18/21, 11/20/2021, 03/22/23, 11/23/23 Previous ablations: None Anticoagulation history: Eliquis     ROS- All systems are reviewed and negative except as per the HPI above.  Physical Exam: BP 104/64   Pulse 61   Ht 5' 3 (1.6 m)   Wt 115.9 kg   BMI 45.28 kg/m   GEN: Well nourished, well developed in no acute distress CARDIAC: Regular rate and rhythm, no murmurs, rubs, gallops RESPIRATORY:  Clear to auscultation without rales, wheezing or rhonchi  ABDOMEN: Soft, non-tender, non-distended EXTREMITIES:  No edema; No deformity    EKG today demonstrates  SR, RBBB Vent. rate 61 BPM PR interval 116 ms QRS duration 126 ms QT/QTcB 506/509 ms   Echo 06/08/22 demonstrated   1. Left ventricular ejection fraction, by estimation, is 65 to 70%. The  left ventricle has normal function. The left ventricle has no regional  wall motion abnormalities. There is mild left ventricular hypertrophy.  Left ventricular diastolic parameters  are consistent with Grade I diastolic dysfunction (impaired relaxation).  Elevated left atrial pressure.   2. Right ventricular systolic function is normal. The right ventricular  size is normal. There is normal pulmonary artery systolic pressure.   3. Left atrial size was moderately dilated.   4. Right atrial size was mild to moderately dilated.   5. The mitral valve is normal in structure. No evidence of mitral valve  regurgitation. No evidence of mitral stenosis.   6. The tricuspid valve is abnormal.   7. The aortic valve is tricuspid. Aortic valve regurgitation is not  visualized. No aortic stenosis is present.   8. The inferior vena cava is normal in size with greater than 50%  respiratory variability, suggesting right atrial pressure of 3 mmHg.    CHA2DS2-VASc Score = 4  The patient's score is based upon: CHF History: 0 HTN History: 1 Diabetes History: 1 Stroke History: 0 Vascular Disease History: 1 Age Score: 0 Gender Score: 1        ASSESSMENT AND PLAN: Paroxysmal Atrial Fibrillation (ICD10:  I48.0) The patient's CHA2DS2-VASc score is 4, indicating a 4.8% annual risk of stroke.   S/p dofetilide  loading 02/2024 Patient appears to be maintaining SR Continue dofetilide  500 mcg BID Continue Eliquis  5 mg BID We discussed smart device technology for home monitoring.   Secondary Hypercoagulable State (ICD10:  D68.69) The patient is at significant risk for stroke/thromboembolism based upon her CHA2DS2-VASc Score of 4.  Continue Apixaban  (Eliquis ). No bleeding issues.   High Risk Medication Monitoring (ICD 10: Z79.899) QT interval on ECG acceptable for dofetilide  monitoring in setting of RBBB. Check bmet/mag today.    OSA  Encouraged nightly CPAP  Obesity Body mass index is 45.28 kg/m.  Encouraged lifestyle modification  HTN Stable on current regimen   Follow up in the AF clinic in 3 months.     Daril Kicks PA-C Af[facility] [address]  [place], [place] [address] [phone]

## 2024-05-01 ENCOUNTER — Ambulatory Visit (HOSPITAL_COMMUNITY): Payer: Self-pay | Admitting: Physician Assistant

## 2024-05-01 LAB — MAGNESIUM: Magnesium: 2.1 mg/dL (ref 1.6–2.3)

## 2024-05-01 LAB — BASIC METABOLIC PANEL WITH GFR
BUN/Creatinine Ratio: 27 (ref 12–28)
BUN: 32 mg/dL — ABNORMAL HIGH (ref 8–27)
CO2: 21 mmol/L (ref 20–29)
Calcium: 10 mg/dL (ref 8.7–10.3)
Chloride: 104 mmol/L (ref 96–106)
Creatinine, Ser: 1.17 mg/dL — ABNORMAL HIGH (ref 0.57–1.00)
Glucose: 251 mg/dL — ABNORMAL HIGH (ref 70–99)
Potassium: 3.7 mmol/L (ref 3.5–5.2)
Sodium: 140 mmol/L (ref 134–144)
eGFR: 53 mL/min/1.73 — ABNORMAL LOW (ref >59)

## 2024-05-02 ENCOUNTER — Other Ambulatory Visit (HOSPITAL_COMMUNITY): Payer: Self-pay | Admitting: *Deleted

## 2024-05-02 DIAGNOSIS — I480 Paroxysmal atrial fibrillation: Secondary | ICD-10-CM

## 2024-06-04 ENCOUNTER — Other Ambulatory Visit: Payer: Self-pay | Admitting: Nurse Practitioner

## 2024-06-04 DIAGNOSIS — I10 Essential (primary) hypertension: Secondary | ICD-10-CM

## 2024-06-05 ENCOUNTER — Other Ambulatory Visit: Payer: Self-pay | Admitting: Nurse Practitioner

## 2024-06-05 ENCOUNTER — Encounter: Payer: Self-pay | Admitting: Nurse Practitioner

## 2024-06-05 ENCOUNTER — Ambulatory Visit (INDEPENDENT_AMBULATORY_CARE_PROVIDER_SITE_OTHER): Admitting: Nurse Practitioner

## 2024-06-05 VITALS — BP 142/85 | HR 80 | Temp 97.2°F | Ht 63.0 in | Wt 260.0 lb

## 2024-06-05 DIAGNOSIS — I480 Paroxysmal atrial fibrillation: Secondary | ICD-10-CM

## 2024-06-05 DIAGNOSIS — E785 Hyperlipidemia, unspecified: Secondary | ICD-10-CM

## 2024-06-05 DIAGNOSIS — E1169 Type 2 diabetes mellitus with other specified complication: Secondary | ICD-10-CM | POA: Diagnosis not present

## 2024-06-05 DIAGNOSIS — K219 Gastro-esophageal reflux disease without esophagitis: Secondary | ICD-10-CM

## 2024-06-05 DIAGNOSIS — I10 Essential (primary) hypertension: Secondary | ICD-10-CM | POA: Diagnosis not present

## 2024-06-05 DIAGNOSIS — K5902 Outlet dysfunction constipation: Secondary | ICD-10-CM | POA: Diagnosis not present

## 2024-06-05 DIAGNOSIS — E119 Type 2 diabetes mellitus without complications: Secondary | ICD-10-CM

## 2024-06-05 DIAGNOSIS — Z7984 Long term (current) use of oral hypoglycemic drugs: Secondary | ICD-10-CM

## 2024-06-05 DIAGNOSIS — Z23 Encounter for immunization: Secondary | ICD-10-CM

## 2024-06-05 DIAGNOSIS — J40 Bronchitis, not specified as acute or chronic: Secondary | ICD-10-CM

## 2024-06-05 LAB — LIPID PANEL

## 2024-06-05 MED ORDER — APIXABAN 5 MG PO TABS: 5.0000 mg | ORAL_TABLET | Freq: Two times a day (BID) | ORAL | 1 refills | Status: AC

## 2024-06-05 MED ORDER — GLIPIZIDE 5 MG PO TABS
5.0000 mg | ORAL_TABLET | Freq: Two times a day (BID) | ORAL | 5 refills | Status: DC
Start: 2024-06-05 — End: 2024-08-02

## 2024-06-05 MED ORDER — ROSUVASTATIN CALCIUM 10 MG PO TABS: 10.0000 mg | ORAL_TABLET | Freq: Every day | ORAL | 1 refills | Status: AC

## 2024-06-05 MED ORDER — LANTUS SOLOSTAR 100 UNIT/ML ~~LOC~~ SOPN: 20.0000 [IU] | PEN_INJECTOR | Freq: Every day | SUBCUTANEOUS | 99 refills | Status: DC

## 2024-06-05 MED ORDER — PANTOPRAZOLE SODIUM 40 MG PO TBEC: 40.0000 mg | DELAYED_RELEASE_TABLET | Freq: Every day | ORAL | 1 refills | Status: DC

## 2024-06-05 MED ORDER — LUBIPROSTONE 24 MCG PO CAPS: 24.0000 ug | ORAL_CAPSULE | Freq: Two times a day (BID) | ORAL | 1 refills | Status: AC

## 2024-06-05 MED ORDER — DEXCOM G7 SENSOR MISC: 5 refills | Status: DC

## 2024-06-05 NOTE — Addendum Note (Signed)
 Addended by: [name] on: 06/05/2024 04:41 PM   Modules accepted: Level of Service

## 2024-06-05 NOTE — Progress Notes (Signed)
 Subjective:    Patient ID: Kathryn Merritt, female    DOB: [birth year], 63 y.o.   MRN: [id number]   Chief Complaint: medical management of chronic issues     HPI:  Kathryn Merritt is a 63 y.o. who identifies as a female who was assigned female at birth.   Social history: Lives with: by herself Work history: retired   Water engineer in today for follow up of the following chronic medical issues:  1. Primary hypertension No c/o chest pain, sob or headache. Does not check blood pressure at home. BP Readings from Last 3 Encounters:  06/05/24 (!) 142/85  04/30/24 104/64  03/22/24 116/74      2. Paroxysmal atrial fibrillation (HCC) No c/o palpitations or heart racing  3. Hyperlipidemia associated with type 2 diabetes mellitus (HCC) Does not watch diet and does no dedicated exercise Lab Results  Component Value Date   CHOL 139 03/08/2024   HDL 48 03/08/2024   LDLCALC 66 03/08/2024   TRIG 145 03/08/2024   CHOLHDL 2.9 03/08/2024     4. Controlled type 2 diabetes mellitus without complication, without long-term current use of insulin  (HCC) 5. Diabetes mellitus treated with injections of non-insulin  medication (HCC) Fasting blood sugars re running around 200-300 . She chcks blood sugars only 1x a day and needs to check at [employer] a day.  Patient was on basaglar  and stopped taking because caused weight gain. Would like CGM Lab Results  Component Value Date   HGBA1C 10.1 (H) 03/13/2024     6. Gastroesophageal reflux disease, unspecified whether esophagitis present Is on omeprazole  daily and is doing well.  7. Esophageal dysphagia No recent issues  8. OSA (obstructive sleep apnea) She doe snot wear her cpap  9. Subclinical hypothyroidism No issues that she is aware of. Lab Results  Component Value Date   TSH 3.470 12/27/2023     10. Vitamin D  deficiency Is not taking daily vitamin d  supplement Last vitamin D  Lab Results  Component Value Date   VD25OH 18.6 (L)  09/24/2022     11. Stage 3a chronic kidney disease (HCC) No voiding issues and no edema Lab Results  Component Value Date   CREATININE 1.17 (H) 04/30/2024     12. Morbid obesity (HCC) Weight is down 7 lbs Wt Readings from Last 3 Encounters:  06/05/24 260 lb (117.9 kg)  04/30/24 255 lb 9.6 oz (115.9 kg)  03/22/24 257 lb 12.8 oz (116.9 kg)   BMI Readings from Last 3 Encounters:  06/05/24 46.06 kg/m  04/30/24 45.28 kg/m  03/22/24 45.67 kg/m      New complaints: None today  Allergies  Allergen Reactions   Metformin  And Related     Made patient feel bad/GI   Outpatient Encounter Medications as of 06/05/2024  Medication Sig   acetaminophen  (TYLENOL ) 500 MG tablet Take 1,000 mg by mouth every 8 (eight) hours as needed for moderate pain.   albuterol  (VENTOLIN  HFA) 108 (90 Base) MCG/ACT inhaler Inhale 2 puffs into the lungs every 6 (six) hours as needed for wheezing or shortness of breath.   apixaban  (ELIQUIS ) 5 MG TABS tablet Take 1 tablet (5 mg total) by mouth 2 (two) times daily.   blood glucose meter kit and supplies Dispense based on patient and insurance preference. Use up to four times daily as directed. (FOR ICD-10 E10.9, E11.9).   cetirizine  (ZYRTEC  ALLERGY) 10 MG tablet Take 1 tablet (10 mg total) by mouth daily.   dofetilide  (TIKOSYN ) 500 MCG  capsule Take 1 capsule (500 mcg total) by mouth 2 (two) times daily.   glipiZIDE  (GLUCOTROL ) 5 MG tablet Take 1 tablet (5 mg total) by mouth 2 (two) times daily before a meal.   Insulin  Glargine (BASAGLAR  KWIKPEN) 100 UNIT/ML Inject 20 Units into the skin daily.   Insulin  Pen Needle (PEN NEEDLES) 31G X 5 MM MISC 1 Needle by Does not apply route in the morning, at noon, in the evening, and at bedtime.   lisinopril  (ZESTRIL ) 20 MG tablet TAKE ONE AND ONE-HALF TABLETS BY MOUTH EVERY DAY   lubiprostone  (AMITIZA ) 24 MCG capsule Take 1 capsule (24 mcg total) by mouth 2 (two) times daily.   pantoprazole  (PROTONIX ) 40 MG tablet Take 1  tablet (40 mg total) by mouth daily.   potassium chloride  SA (KLOR-CON  M) 20 MEQ tablet Take 1 tablet (20 mEq total) by mouth 2 (two) times daily. (Patient not taking: Reported on 06/05/2024)   rosuvastatin  (CRESTOR ) 10 MG tablet Take 1 tablet (10 mg total) by mouth daily.   torsemide  (DEMADEX ) 100 MG tablet Take 1 tablet (100 mg total) by mouth daily.   [DISCONTINUED] albuterol  (VENTOLIN  HFA) 108 (90 Base) MCG/ACT inhaler Inhale 2 puffs into the lungs every 6 (six) hours as needed for wheezing or shortness of breath.   [DISCONTINUED] lisinopril  (ZESTRIL ) 20 MG tablet Take 1.5 tablets (30 mg total) by mouth daily.   [DISCONTINUED] nitrofurantoin , macrocrystal-monohydrate, (MACROBID ) 100 MG capsule Take 1 capsule (100 mg total) by mouth 2 (two) times daily. 1 po BId   [DISCONTINUED] predniSONE  (STERAPRED UNI-PAK 21 TAB) 10 MG (21) TBPK tablet As directed x 6 days   No facility-administered encounter medications on file as of 06/05/2024.    Past Surgical History:  Procedure Laterality Date   BALLOON DILATION N/A 02/22/2022   Procedure: BALLOON DILATION;  Surgeon: Cindie Carlin POUR, DO;  Location: [facility];  Service: Endoscopy;  Laterality: N/A;   BIOPSY  02/22/2022   Procedure: BIOPSY;  Surgeon: Cindie Carlin POUR, DO;  Location: [facility];  Service: Endoscopy;;   CARDIOVERSION N/A 03/18/2021   Procedure: CARDIOVERSION;  Surgeon: Debera Jayson MATSU, MD;  Location: [facility];  Service: Cardiovascular;  Laterality: N/A;   CESAREAN SECTION     times 2   COLONOSCOPY WITH PROPOFOL  N/A 02/22/2022   Procedure: COLONOSCOPY WITH PROPOFOL ;  Surgeon: Cindie Carlin POUR, DO;  Location: [facility];  Service: Endoscopy;  Laterality: N/A;  7:30am   CORONARY ANGIOGRAPHY N/A 06/09/2022   Procedure: CORONARY ANGIOGRAPHY;  Surgeon: Wendel Lurena POUR, MD;  Location: [facility];  Service: Cardiovascular;  Laterality: N/A;   ESOPHAGOGASTRODUODENOSCOPY (EGD) WITH PROPOFOL  N/A 02/22/2022   Procedure:  ESOPHAGOGASTRODUODENOSCOPY (EGD) WITH PROPOFOL ;  Surgeon: Cindie Carlin POUR, DO;  Location: [facility];  Service: Endoscopy;  Laterality: N/A;   INCISION AND DRAINAGE ABSCESS Right    axilla   LITHOTRIPSY     POLYPECTOMY  02/22/2022   Procedure: POLYPECTOMY;  Surgeon: Cindie Carlin POUR, DO;  [facility]: [facility];  Service: Endoscopy;;    Family History  Problem Relation Age of Onset   Hypertension Mother    Cancer Father    Hypertension Father    Cancer Brother    Heart disease Paternal Grandmother    Breast cancer Neg Hx    Colon cancer Neg Hx       Controlled substance contract: n/a     Review of Systems  Constitutional:  Negative for diaphoresis.  Eyes:  Negative for pain.  Respiratory:  Negative for shortness of breath.   Cardiovascular:  Negative for chest pain, palpitations and leg swelling.  Gastrointestinal:  Negative for abdominal pain.  Endocrine: Negative for polydipsia.  Skin:  Negative for rash.  Neurological:  Negative for dizziness, weakness and headaches.  Hematological:  Does not bruise/bleed easily.  All other systems reviewed and are negative.      Objective:   Physical Exam Vitals and nursing note reviewed.  Constitutional:      General: She is not in acute distress.    Appearance: Normal appearance. She is well-developed.  HENT:     Head: Normocephalic.     Right Ear: Tympanic membrane normal.     Left Ear: Tympanic membrane normal.     Nose: Nose normal.     Mouth/Throat:     Mouth: Mucous membranes are moist.  Eyes:     Pupils: Pupils are equal, round, and reactive to light.  Neck:     Vascular: No carotid bruit or JVD.  Cardiovascular:     Rate and Rhythm: Normal rate and regular rhythm.     Heart sounds: Normal heart sounds.  Pulmonary:     Effort: Pulmonary effort is normal. No respiratory distress.     Breath sounds: Normal breath sounds. No wheezing or rales.  Chest:     Chest wall: No tenderness.  Abdominal:      General: Bowel sounds are normal. There is no distension or abdominal bruit.     Palpations: Abdomen is soft. There is no hepatomegaly, splenomegaly, mass or pulsatile mass.     Tenderness: There is no abdominal tenderness.  Musculoskeletal:        General: Normal range of motion.     Cervical back: Normal range of motion and neck supple.  Lymphadenopathy:     Cervical: No cervical adenopathy.  Skin:    General: Skin is warm and dry.  Neurological:     Mental Status: She is alert and oriented to person, place, and time.     Deep Tendon Reflexes: Reflexes are normal and symmetric.  Psychiatric:        Behavior: Behavior normal.        Thought Content: Thought content normal.        Judgment: Judgment normal.    BP (!) 142/85   Pulse 80   Temp (!) 97.2 F (36.2 C) (Temporal)   Ht 5' 3 (1.6 m)   Wt 260 lb (117.9 kg)   SpO2 97%   BMI 46.06 kg/m    Hgba1c 9.7%     Assessment & Plan:  Kathryn Merritt comes in today with chief complaint of Medical Management of Chronic Issues   Diagnosis and orders addressed:  1. Primary hypertension (Primary) Low sodium diet - CBC with Differential/Platelet - CMP14+EGFR  2. Controlled type 2 diabetes mellitus without complication, without long-term current use of insulin  (HCC) Start in lantus  2u daily CGM sent to pharmacy - Bayer DCA Hb A1c Waived - Microalbumin / creatinine urine ratio - glipiZIDE  (GLUCOTROL ) 5 MG tablet; Take 1 tablet (5 mg total) by mouth 2 (two) times daily before a meal.  Dispense: 60 tablet; Refill: 5 - Continuous Glucose Sensor (DEXCOM G7 SENSOR) MISC; Change sensor every 10 days  Dispense: 3 each; Refill: 5 - insulin  glargine (LANTUS  SOLOSTAR) 100 UNIT/ML Solostar Pen; Inject 20 Units into the skin daily.  Dispense: 15 mL; Refill: PRN  3. Hyperlipidemia associated with type 2 diabetes mellitus (HCC) Low fat diet -  Lipid panel - rosuvastatin  (CRESTOR ) 10 MG tablet; Take 1 tablet (10 mg total) by mouth daily.   Dispense: 90 tablet; Refill: 1  4. Constipation due to outlet dysfunction Increase fiber in diet - lubiprostone  (AMITIZA ) 24 MCG capsule; Take 1 capsule (24 mcg total) by mouth 2 (two) times daily.  Dispense: 180 capsule; Refill: 1  5. Gastroesophageal reflux disease, unspecified whether esophagitis present Avoid spicy foods Do not eat 2 hours prior to bedtime - pantoprazole  (PROTONIX ) 40 MG tablet; Take 1 tablet (40 mg total) by mouth daily.  Dispense: 90 tablet; Refill: 1  6. Paroxysmal atrial fibrillation (HCC) Avoid caffeine - apixaban  (ELIQUIS ) 5 MG TABS tablet; Take 1 tablet (5 mg total) by mouth 2 (two) times daily.  Dispense: 180 tablet; Refill: 1   Labs pending Health Maintenance reviewed Diet and exercise encouraged  Follow up plan: 3 months   Mary-Margaret Gladis, FNP

## 2024-06-05 NOTE — Patient Instructions (Signed)

## 2024-06-06 ENCOUNTER — Ambulatory Visit: Payer: Self-pay | Admitting: Nurse Practitioner

## 2024-06-06 LAB — BAYER DCA HB A1C WAIVED: HB A1C (BAYER DCA - WAIVED): 9.7 % — ABNORMAL HIGH (ref 4.8–5.6)

## 2024-06-06 LAB — CBC WITH DIFFERENTIAL/PLATELET
Basophils Absolute: 0.1 x10E3/uL (ref 0.0–0.2)
Basos: 1 %
EOS (ABSOLUTE): 0.2 x10E3/uL (ref 0.0–0.4)
Eos: 3 %
Hematocrit: 40.3 % (ref 34.0–46.6)
Hemoglobin: 12.6 g/dL (ref 11.1–15.9)
Immature Grans (Abs): 0 x10E3/uL (ref 0.0–0.1)
Immature Granulocytes: 0 %
Lymphocytes Absolute: 3 x10E3/uL (ref 0.7–3.1)
Lymphs: 48 %
MCH: 29.4 pg (ref 26.6–33.0)
MCHC: 31.3 g/dL — ABNORMAL LOW (ref 31.5–35.7)
MCV: 94 fL (ref 79–97)
Monocytes Absolute: 0.5 x10E3/uL (ref 0.1–0.9)
Monocytes: 8 %
Neutrophils Absolute: 2.5 x10E3/uL (ref 1.4–7.0)
Neutrophils: 40 %
Platelets: 194 x10E3/uL (ref 150–450)
RBC: 4.28 x10E6/uL (ref 3.77–5.28)
RDW: 13.9 % (ref 11.7–15.4)
WBC: 6.2 x10E3/uL (ref 3.4–10.8)

## 2024-06-06 LAB — CMP14+EGFR
ALT: 17 IU/L (ref 0–32)
AST: 17 IU/L (ref 0–40)
Albumin: 4 g/dL (ref 3.9–4.9)
Alkaline Phosphatase: 72 IU/L (ref 49–135)
BUN/Creatinine Ratio: 22 (ref 12–28)
BUN: 23 mg/dL (ref 8–27)
Bilirubin Total: 0.3 mg/dL (ref 0.0–1.2)
CO2: 23 mmol/L (ref 20–29)
Calcium: 9.9 mg/dL (ref 8.7–10.3)
Chloride: 107 mmol/L — AB (ref 96–106)
Creatinine, Ser: 1.05 mg/dL — AB (ref 0.57–1.00)
Globulin, Total: 2.3 g/dL (ref 1.5–4.5)
Glucose: 316 mg/dL — AB (ref 70–99)
Potassium: 4.1 mmol/L (ref 3.5–5.2)
Sodium: 144 mmol/L (ref 134–144)
Total Protein: 6.3 g/dL (ref 6.0–8.5)
eGFR: 60 mL/min/1.73 (ref >59)

## 2024-06-06 LAB — LIPID PANEL
Cholesterol, Total: 141 mg/dL (ref 100–199)
HDL: 49 mg/dL (ref >39)
LDL CALC COMMENT:: 2.9 ratio (ref 0.0–4.4)
LDL Chol Calc (NIH): 65 mg/dL (ref 0–99)
Triglycerides: 159 mg/dL — AB (ref 0–149)
VLDL Cholesterol Cal: 27 mg/dL (ref 5–40)

## 2024-06-06 LAB — MICROALBUMIN / CREATININE URINE RATIO
Creatinine, Urine: 68.4 mg/dL
Microalb/Creat Ratio: 23 mg/g{creat} (ref 0–29)
Microalbumin, Urine: 15.9 ug/mL

## 2024-06-11 ENCOUNTER — Other Ambulatory Visit (HOSPITAL_COMMUNITY): Payer: Self-pay

## 2024-06-12 ENCOUNTER — Telehealth: Payer: Self-pay | Admitting: Pharmacy Technician

## 2024-06-12 ENCOUNTER — Other Ambulatory Visit (HOSPITAL_COMMUNITY): Payer: Self-pay

## 2024-06-12 MED ORDER — BASAGLAR KWIKPEN 100 UNIT/ML ~~LOC~~ SOPN: 20.0000 [IU] | PEN_INJECTOR | Freq: Every day | SUBCUTANEOUS | 2 refills | Status: DC

## 2024-06-12 NOTE — Telephone Encounter (Signed)
 Pharmacy Patient Advocate Encounter   Received notification from Patient Advice Request messages that prior authorization for Lantus  is required/requested.   Insurance verification completed.   The patient is insured through [employer] .   Per test claim:  Basaglar  is preferred by the insurance.  If suggested medication is appropriate, Please send in a new RX and discontinue this one. If not, please advise as to why it's not appropriate so that we may request a Prior Authorization. Please note, some preferred medications may still require a PA.  If the suggested medications have not been trialed and there are no contraindications to their use, the PA will not be submitted, as it will not be approved.  Basaglar  was filled yesterday at her pharmacy. Please let us  know if that is incorrect and there's a reason that she needs Lantus  over Basaglar  and we'll do the PA. Thanks!

## 2024-06-12 NOTE — Telephone Encounter (Signed)
 Was her Basaglar  stopped and Lantus  started?

## 2024-06-12 NOTE — Telephone Encounter (Signed)
 PA request has been Received. New Encounter has been or will be created for follow up. For additional info see Pharmacy Prior Auth telephone encounter from 06/12/24.

## 2024-06-22 ENCOUNTER — Ambulatory Visit: Payer: Self-pay

## 2024-06-22 NOTE — Telephone Encounter (Signed)
 Patient called because her sugar was 395 as of 8:13. She stated the doctor raised her insulin  but that's not bringing her levels down at all. Can covering provider advise on this.

## 2024-06-22 NOTE — Telephone Encounter (Signed)
 FYI Only or Action Required?: Action required by provider: Advise on what to do for elevated glucose levels .  Patient was last seen in primary care on 06/05/2024 by [name], FNP.  Called Nurse Triage reporting No chief complaint on file..  Symptoms began today.  Interventions attempted: Nothing.  Symptoms are: gradually worsening.  Triage Disposition: Call PCP Now  Patient/caregiver understands and will follow disposition?: Yes     Copied from CRM [id number]. Topic: Clinical - Red Word Triage >> Jun 22, 2024  8:12 AM [name] wrote: Red Word that prompted transfer to Nurse Triage: Patient called because her sugar was 395 as of 8:13. She stated the doctor raised her insulin  but that's not bringing her levels down at all. 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 She did not refuse NT she just couldn't talk at that moment. Reason for Disposition  [1] Blood glucose > 300 mg/dL (83.2 mmol/L) AND [7] two or more times in a row  Answer Assessment - Initial Assessment Questions Patient states the lowest  blood sugar reading she has had recently has been 176. Patient requesting some advise on what she should do to get her blood sugar levels leveled out. Patient requesting a call back or Mychart message back regarding her request.   1. BLOOD GLUCOSE: What is your blood glucose level?      213 this morning , 395 as of 8:13, most recent level 386 2. ONSET: When did you check the blood glucose?     This morning  3. OTHER SYMPTOMS: Do you have any symptoms? (e.g., fever, frequent urination, difficulty breathing, dizziness, weakness, vomiting)     Some nausea, frequent urination, fatigue, thirst and some dizziness.  Protocols used: Diabetes - High Blood Sugar-A-AH

## 2024-06-22 NOTE — Telephone Encounter (Signed)
 I spoke to pt and she states her fasting blood sugar this morning was low 200 and then she proceeded to eat a gravy biscuit and sweet tea which is what took her blood sugar to 395. She had a [place] sandwich on [employer] for lunch with diet soda to drink and now her blood sugar is now 245 while on the phone. Advised pt she needs to monitor her sweets and carbs and try to limit them to 45 per meal especially if she is already running high and increase her hydration. She is complaining of headache now. Advised she can try walking and drinking water to lower her readings. If she develops nausea/ vomiting, visual disturbances or increased weakness and her blood sugars continue to stay around 300 she should go to ER for evaluation. Will route this to PCP coverage to see if adjustments should be made to her insulin  (basaglar  26 units daily) but it will be Monday as all providers are gone from office today. Pt voiced understanding.

## 2024-06-22 NOTE — Telephone Encounter (Signed)
 I need more information than this.  How long before sugar measured had she eaten/ drank anything.  Is she injecting only 20 units?  What is her FASTING blood sugar before she eats in the morning

## 2024-06-24 NOTE — Progress Notes (Deleted)
 Cardiology Office Note:   Date:  06/24/2024  ID:  Kathryn Merritt, DOB [birth year], MRN [id number] PCP: [name], FNP  [facility] Providers Cardiologist:  Lynwood Schilling, MD Electrophysiologist:  Fonda Kitty, MD  Sleep Medicine:  Wilbert Bihari, MD {  History of Present Illness:   Kathryn Merritt is a 63 y.o. female  who presents for she was recently admitted 02/2021 with new onset atrial fibrillation starting around 0200 day of admission. They could not exclude atypical atrial flutter per notes. She was started on IV diltiazem  but underwent DCCV 03/18/21 with successful conversion to NSR. She was started on Eliquis  and Toprol . She was also noted to have mildly elevated troponin values felt c/w demand ischemia (also had hypertensive urgency during admission). 2D echo 03/18/21 EF 65-70%, moderate LVH, normal diastolic parameters, normal RV, mild LAE.  She was in the ED in March 2023.  She had DCCV.    She was in the hospital with chest pain in Sept 2024 and had no disease on cath.  EF was well preserved on echo.  She had recurrence of her afib and was seen at the ED again 11/23/23 and underwent DCCV. She was seen by Dr Kitty who recommended dofetilide .   ***   ***  The patient continues to have chest discomfort at rest.  It is shooting.  It is sharp.  Does not radiate.  There is no precipitating cause.  She does not think it is like her previous reflux.  She feels fatigued.  She still has to do a lot of activities with her job and at home.  Not having any new complaints compared to the hospitalization.    ROS: ***  Studies Reviewed:    EKG:       ***  Risk Assessment/Calculations:   {Does this patient have ATRIAL FIBRILLATION?:[phone]} No BP recorded.  {Refresh Note OR Click here to enter BP  :1}***        Physical Exam:   VS:  There were no vitals taken for this visit.   Wt Readings from Last 3 Encounters:  06/05/24 260 lb (117.9 kg)  04/30/24 255 lb 9.6 oz (115.9  kg)  03/22/24 257 lb 12.8 oz (116.9 kg)     GEN: Well nourished, well developed in no acute distress NECK: No JVD; No carotid bruits CARDIAC: ***RR, *** murmurs, rubs, gallops RESPIRATORY:  Clear to auscultation without rales, wheezing or rhonchi  ABDOMEN: Soft, non-tender, non-distended EXTREMITIES:  No edema; No deformity   ASSESSMENT AND PLAN:   Chest pain: ***  The patient's chest discomfort is nonanginal.  I asked her to discuss this further with her primary care provider.  I do not suspect that this is coronary spasm or microvascular disease.  I would have him pursue noncardiac etiologies.   Paroxysmal atrial fibrillation:   Ms. Kathryn Merritt has a CHA2DS2 - VASc score of 3.   ***  She tolerates anticoagulation.  No change in therapy.   Essential HTN:   Blood pressure is ***  at target.  No change in therapy.   DM: A1c was ***  6.5.  Continue meds as listed.   Morbid obesity:  ***    She has had some weight loss and I encouraged more the same.    Sleep apnea:    ***   She denies using CPAP although she has not had much symptomatic improvement as far she is concerned.  She is persisting.  Follow up ***  Signed, Lynwood Schilling, MD

## 2024-06-25 ENCOUNTER — Other Ambulatory Visit: Payer: Self-pay

## 2024-06-25 DIAGNOSIS — E119 Type 2 diabetes mellitus without complications: Secondary | ICD-10-CM

## 2024-06-25 NOTE — Telephone Encounter (Signed)
 Already requested that she see Mliss as soon as possible to discuss options

## 2024-06-25 NOTE — Telephone Encounter (Signed)
 Sounds like this patient could use either a referral to [facility] for disease education/ management with nursing and Pharmacy vs referral to dietician.  Agree with recommendations.  CCing to PCP for follow up.

## 2024-06-25 NOTE — Telephone Encounter (Signed)
 Patient also had a duplicate mychart message. Responded to her in that encounter. Referral placed for [facility] asap

## 2024-06-25 NOTE — Telephone Encounter (Signed)
 Patient had stopped her basaglar  for a few weeks without consulting with PCP. We started her back on an increased dose and discussed diet. Please make her an appointment with Mliss to discuss diet.

## 2024-06-27 ENCOUNTER — Ambulatory Visit: Admitting: Cardiology

## 2024-06-27 ENCOUNTER — Telehealth: Payer: Self-pay

## 2024-06-27 DIAGNOSIS — I10 Essential (primary) hypertension: Secondary | ICD-10-CM

## 2024-06-27 DIAGNOSIS — R072 Precordial pain: Secondary | ICD-10-CM

## 2024-06-27 DIAGNOSIS — E118 Type 2 diabetes mellitus with unspecified complications: Secondary | ICD-10-CM

## 2024-06-27 DIAGNOSIS — I480 Paroxysmal atrial fibrillation: Secondary | ICD-10-CM

## 2024-06-27 NOTE — Progress Notes (Signed)
 Care Guide Pharmacy Note  06/27/2024 Name: Kathryn Merritt MRN: [id number] DOB: [birth year]  Referred By: [name], FNP Reason for referral: Complex Care Management (Outreach to schedule with Pharm d )   Kathryn Merritt is a 63 y.o. year old female who is a primary care patient of [name], FNP.  Kathryn Merritt was referred to the pharmacist for assistance related to: DMII  Successful contact was made with the patient to discuss pharmacy services including being ready for the pharmacist to call at least 5 minutes before the scheduled appointment time and to have medication bottles and any blood pressure readings ready for review. The patient agreed to meet with the pharmacist via face to face  on (date/time). 07/12/2024   Jeoffrey Buffalo , RMA     [place]  [facility], [facility] Guide  Direct Dial: [phone]  Website: [email or web address]

## 2024-07-09 ENCOUNTER — Encounter: Payer: Self-pay | Admitting: Nurse Practitioner

## 2024-07-09 ENCOUNTER — Ambulatory Visit: Admitting: Nurse Practitioner

## 2024-07-09 VITALS — BP 151/80 | HR 54 | Temp 97.0°F | Ht 63.0 in | Wt 264.0 lb

## 2024-07-09 DIAGNOSIS — K0889 Other specified disorders of teeth and supporting structures: Secondary | ICD-10-CM

## 2024-07-09 MED ORDER — CLINDAMYCIN HCL 300 MG PO CAPS: 300.0000 mg | ORAL_CAPSULE | Freq: Four times a day (QID) | ORAL | 0 refills | Status: DC

## 2024-07-09 MED ORDER — TRAMADOL HCL 50 MG PO TABS: 50.0000 mg | ORAL_TABLET | Freq: Three times a day (TID) | ORAL | 0 refills | Status: AC | PRN

## 2024-07-09 NOTE — Progress Notes (Signed)
 Subjective:    Patient ID: Kathryn Merritt, female    DOB: 04/29/1961, 63 y.o.   MRN: [id number]  Chief Complaint: cheek pain (Concerned she may have a sinus infection but also has a bad tooth she thinks)   HPI  Patient has cheek pain. Not sure if she has sinus infection or tooth issues. Has been hurting for several days. She really thinks it is a tooth ache.  Patient Active Problem List   Diagnosis Date Noted   Anxiety 04/02/2024   Depression 04/02/2024   Heel spur 04/02/2024   Osteoarthritis 04/02/2024   Plantar fasciitis of left foot 04/02/2024   Pulmonary HTN (HCC) 04/02/2024   Uterine polyp 04/02/2024   Palpitations 02/02/2024   Diabetes mellitus treated with injections of non-insulin  medication (HCC) 08/01/2023   Hypercoagulable state due to paroxysmal atrial fibrillation (HCC) 03/29/2023   Lesion of left ear 06/24/2022   Vitamin D  deficiency 01/21/2022   Stage 3a chronic kidney disease (HCC) 01/21/2022   Constipation due to outlet dysfunction 01/13/2022   Esophageal dysphagia 01/13/2022   Subclinical hypothyroidism 10/23/2021   Gastroesophageal reflux disease 07/16/2021   OSA (obstructive sleep apnea) 07/14/2021   Hypertension 06/18/2021   Hyperlipidemia associated with type 2 diabetes mellitus (HCC) 06/18/2021   Paroxysmal atrial fibrillation (HCC) 03/18/2021   Morbid obesity (HCC) 03/18/2021   Controlled type 2 diabetes mellitus without complication, without long-term current use of insulin  (HCC) 03/18/2021       Review of Systems  Constitutional:  Negative for diaphoresis.  Eyes:  Negative for pain.  Respiratory:  Negative for shortness of breath.   Cardiovascular:  Negative for chest pain, palpitations and leg swelling.  Gastrointestinal:  Negative for abdominal pain.  Endocrine: Negative for polydipsia.  Skin:  Negative for rash.  Neurological:  Negative for dizziness, weakness and headaches.  Hematological:  Does not bruise/bleed easily.  All other  systems reviewed and are negative.      Objective:   Physical Exam Constitutional:      Appearance: Normal appearance.  HENT:     Mouth/Throat:     Comments: Right lower jaw swelling with erythmatous gum along back molar. Tooth has filling in it and may be abscessed. Cardiovascular:     Rate and Rhythm: Normal rate and regular rhythm.     Heart sounds: Normal heart sounds.  Pulmonary:     Breath sounds: Normal breath sounds.  Neurological:     General: No focal deficit present.     Mental Status: She is alert and oriented to person, place, and time.  Psychiatric:        Mood and Affect: Mood normal.        Behavior: Behavior normal.     BP (!) 151/80   Pulse (!) 54   Temp (!) 97 F (36.1 C) (Temporal)   Ht 5' 3 (1.6 m)   Wt 264 lb (119.7 kg)   SpO2 97%   BMI 46.77 kg/m        Assessment & Plan:  Verneita LITTIE Litter in today with chief complaint of cheek pain (Concerned she may have a sinus infection but also has a bad tooth she thinks)   1. Tooth ache (Primary) Gargle with salt water See dentist ASAP  Meds ordered this encounter  Medications   clindamycin (CLEOCIN) 300 MG capsule    Sig: Take 1 capsule (300 mg total) by mouth 4 (four) times daily.    Dispense:  40 capsule    Refill:  0  Supervising Provider:   DETTINGER, JOSHUA A [[id number]]   traMADol (ULTRAM) 50 MG tablet    Sig: Take 1 tablet (50 mg total) by mouth every 8 (eight) hours as needed for up to 5 days.    Dispense:  15 tablet    Refill:  0    Supervising Provider:   MARYANNE CHEW A [[id number]]       The above assessment and management plan was discussed with the patient. The patient verbalized understanding of and has agreed to the management plan. Patient is aware to call the clinic if symptoms persist or worsen. Patient is aware when to return to the clinic for a follow-up visit. Patient educated on when it is appropriate to go to the emergency department.   Mary-Margaret Gladis,  FNP

## 2024-07-09 NOTE — Patient Instructions (Signed)
 Dental Pain: What It Means  Dental pain is often a sign that something is wrong with your teeth or gums. You can also have pain after a dental treatment. If you have dental pain, it's important to contact your dental care provider, especially if you don't know what's causing the pain.  Dental pain may hurt a lot or a little. It can be caused by many things, including: Tooth decay. This is also called cavities or caries. Infection. An abscess. This is when the inner part of the tooth is filled with pus. An injury or a crack in the tooth. Gum disease or gums that move back and expose the root of a tooth. Abnormal grinding or clenching of teeth. Not taking good care of your teeth. Sometimes the cause of pain isn't known. You may have pain all the time, or it may come and go. It may happen only when you're: Chewing. Exposed to hot or cold temperatures. Eating or drinking foods or drinks with a lot of sugar in them, such as soda or candy. Follow these instructions at home: Medicines Take your medicines only as told. If you were given antibiotics, take them as told. Do not stop taking them even if you start to feel better. Eating and drinking Avoid foods or drinks that cause you pain. These may include: Very hot or very cold foods or drinks. Sweet or sugary foods or drinks. Managing pain and swelling  Use ice or an ice pack on the painful area of your face as told. Put ice in a plastic bag. Place a towel between your skin and the ice. Leave the ice on for 20 minutes, 2-3 times a day. Remove the ice if your skin turns bright red. This is very important. If you cannot feel pain, heat, or cold, you have a greater risk of damage to the area. If your skin turns red, take off the ice right away to prevent skin damage. The risk of damage is higher if you can't feel pain, heat, or cold. Brushing and flossing Brush your teeth twice a day using a fluoride toothpaste. Use a toothpaste made for  sensitive teeth as told by your dental care provider. Always use a soft toothbrush. This will help prevent irritation of your gums. Floss your teeth at least once a day. General instructions Do not apply heat to the outside of your face. To cleanse your mouth and help with any irritation and swelling in the painful area, you can try rinsing with salt water. Swish and gargle with salt water and then spit it out. To make salt water, add a half to a whole spoonful of salt to a glass of warm water. Mix well. You can repeat this every 2-3 hours for pain. Contact a dental care provider if: You have dental pain and you don't know why. Your pain isn't controlled with medicines. Your symptoms get worse. You have new symptoms. Get help right away if: You can't open your mouth. You're having trouble breathing or swallowing. You have a fever. Your face, neck, or jaw is swollen. These symptoms may be an emergency. Call 911 right away. Do not wait to see if the symptoms will go away. Do not drive yourself to the hospital. This information is not intended to replace advice given to you by your health care provider. Make sure you discuss any questions you have with your health care provider. Document Revised: 07/19/2023 Document Reviewed: 02/03/2023 Elsevier Patient Education  2024 [employer].

## 2024-07-11 NOTE — Progress Notes (Unsigned)
 07/12/2024 Name: Kathryn Merritt MRN: [id number] DOB: [birth year]  Chief Complaint  Patient presents with   Diabetes    Kathryn Merritt is a 63 y.o. year old female who was referred for medication management by their primary care provider, [name], FNP. They presented for a face to face visit today.   They were referred to the pharmacist by their PCP for assistance in managing diabetes    Subjective:    Care Team: Primary Care Provider: [name], FNP ; Next Scheduled Visit: 09/06/2024 Cardiologist: Quita JONELLE Kicks, PA; Next Scheduled Visit: 08/22/2024  Medication Access/Adherence  Current Pharmacy:  [facility] [address] [place] [address] Phone: [phone] Fax: [phone]  [facility] - [place], [place] - [address] [place] [address] Phone: [phone] Fax: [phone]  [facility] [address] [place] [address] Phone: [phone] Fax: [phone]   Patient reports affordability concerns with their medications: Yes  - High Deductible Plan ($6500 at start of year) Patient reports access/transportation concerns to their pharmacy: No  Patient reports adherence concerns with their medications:  No     Diabetes:  Current medications:  - Glipizide  5 mg (1 tablet 2 times daily before meals) - Basaglar  26 units daily, feels gained 6-10 lbs since starting it Medications tried in the past:  - Metformin  with GI upset - Mounjaro , too expensive before meeting deductible - Ozempic , wasn't covered - Farxiga , cost issues  Current glucose readings: 278, wakes up at 245 with > 12 hours fast, takes Basaglar  in the morning  Dexcom G7 CGM--uses phone  Date of Download: 07/12/2024 % Time CGM is active: 99.7% Average Glucose: 264 mg/dL Glucose Management Indicator: 9.6%  Glucose Variability: 22.5 (goal <36%) Time in  Goal:  - Time in range 70-18%: 7% - Time above range: 93% - Time below range: 0% Observed patterns:  - Consistently > 180 with some instances > 350. - AM basaglar  26 units with little effect  Patient denies hypoglycemic s/sx including dizziness, shakiness, sweating. Patient reports hyperglycemic symptoms including polyuria, polydipsia, polyphagia, nocturia, neuropathy, blurred vision.  Current meal patterns:  - Breakfast: Biscuit - Lunch [place] sandwich with chips - Supper varies, not feeling hungry often and feels bloated - Snacks grapes - Drinks Diet Coke, unsweet tea with Splenda, not much water daily unless very thirsty`  Current physical activity: daycare, cleaning, struggles with getting out and walking, goal to start walking more with cart around the store.  Current medication access support: worried about cost moving into the new year with high deductible plan.  Macrovascular and Microvascular Risk Reduction:  Statin? yes (rosuvastatin  10 mg);   The 10-year ASCVD risk score (Arnett DK, et al., 2019) is: 12%   Values used to calculate the score:     Age: 3 years     Clincally relevant sex: Female     Is Non-Hispanic African American: No     Diabetic: Yes     Tobacco smoker: No     Systolic Blood Pressure: 151 mmHg     Is BP treated: Yes     HDL Cholesterol: 49 mg/dL     Total Cholesterol: 141 mg/dL  ACEi/ARB? yes (Lisinopril  20 mg  1.5 tabs daily) Takes 1 tablet daily Last urinary albumin/creatinine ratio:  Lab Results  Component Value Date   MICRALBCREAT 23 06/05/2024   MICRALBCREAT 39 (H) 09/24/2022   MICRALBCREAT  62 (H) 06/23/2021   Last eye exam:  Lab Results  Component Value Date   HMDIABEYEEXA No Retinopathy 10/14/2021   Last foot exam: 06/05/2024 Tobacco Use:  Tobacco Use: Low Risk  (07/09/2024)   Patient History    Smoking Tobacco Use: Never    Smokeless Tobacco Use: Never    Passive Exposure: Not on file     Objective:  Lab Results   Component Value Date   HGBA1C 9.7 (H) 06/05/2024    Lab Results  Component Value Date   CREATININE 1.05 (H) 06/05/2024   BUN 23 06/05/2024   NA 144 06/05/2024   K 4.1 06/05/2024   CL 107 (H) 06/05/2024   CO2 23 06/05/2024    Lab Results  Component Value Date   CHOL 141 06/05/2024   HDL 49 06/05/2024   LDLCALC 65 06/05/2024   TRIG 159 (H) 06/05/2024   CHOLHDL 2.9 06/05/2024    Medications Reviewed Today     Reviewed by Gaile Powell HERO, RPH (Pharmacist) on 07/12/24 at 1509  Med List Status: <None>   Medication Order Taking? Sig Documenting Provider Last Dose Status Informant  acetaminophen  (TYLENOL ) 500 MG tablet [id number] Yes Take 1,000 mg by mouth every 8 (eight) hours as needed for moderate pain. [provider]  Active Self, Pharmacy Records  albuterol  (VENTOLIN  HFA) 108 (90 Base) MCG/ACT inhaler [id number] Yes Inhale 2 puffs into the lungs every 6 (six) hours as needed for wheezing or shortness of breath. Gladis, Mary-Margaret, FNP  Active   apixaban  (ELIQUIS ) 5 MG TABS tablet [id number] Yes Take 1 tablet (5 mg total) by mouth 2 (two) times daily. Gladis Mary-Margaret, FNP  Active   blood glucose meter kit and supplies [id number]  Dispense based on patient and insurance preference. Use up to four times daily as directed. (FOR ICD-10 E10.9, E11.9). Therisa Benton PARAS, NP  Active Self, Pharmacy Records  cetirizine  (ZYRTEC  ALLERGY) 10 MG tablet [id number]  Take 1 tablet (10 mg total) by mouth daily.  Patient not taking: Reported on 07/12/2024   Leath-WarrenEtta PARAS, NP  Consider Medication Status and Discontinue (Change in therapy) Self, Pharmacy Records  clindamycin (CLEOCIN) 300 MG capsule [id number] Yes Take 1 capsule (300 mg total) by mouth 4 (four) times daily. [name], FNP  Active   Continuous Glucose Sensor (DEXCOM G7 SENSOR) [place] [id number] Yes Change sensor every 10 days [name], FNP  Active   dofetilide  (TIKOSYN ) 500 MCG capsule  [id number] Yes Take 1 capsule (500 mcg total) by mouth 2 (two) times daily. Fenton, Clint R, PA  Active   glipiZIDE  (GLUCOTROL ) 5 MG tablet [id number] Yes Take 1 tablet (5 mg total) by mouth 2 (two) times daily before a meal. Gladis, Mary-Margaret, FNP  Active   Insulin  Glargine (BASAGLAR  KWIKPEN) 100 UNIT/ML [id number] Yes Inject 20 Units into the skin daily.  Patient taking differently: Inject 26 Units into the skin daily.   Gladis, Mary-Margaret, FNP  Active   Insulin  Pen Needle (PEN NEEDLES) 31G X 5 MM MISC [id number]  1 Needle by Does not apply route in the morning, at noon, in the evening, and at bedtime. [name], FNP  Active Self, Pharmacy Records  lisinopril  (ZESTRIL ) 20 MG tablet [id number] Yes TAKE ONE AND ONE-HALF TABLETS BY MOUTH EVERY DAY  Patient taking differently: Take 20 mg by mouth daily.   Gladis, Mary-Margaret, FNP  Active   lubiprostone  (AMITIZA ) 24 MCG capsule [id number] Yes Take 1 capsule (24 mcg total) by mouth 2 (two) times daily.  Gladis, Mary-Margaret, FNP  Active   pantoprazole  (PROTONIX ) 40 MG tablet [id number] Yes Take 1 tablet (40 mg total) by mouth daily. Gladis, Mary-Margaret, FNP  Active   potassium chloride  SA (KLOR-CON  M) 20 MEQ tablet [id number]  Take 1 tablet (20 mEq total) by mouth 2 (two) times daily.  Patient not taking: Reported on 07/12/2024   [name], FNP  Consider Medication Status and Discontinue (Change in therapy) Self, Pharmacy Records  rosuvastatin  (CRESTOR ) 10 MG tablet [id number] Yes Take 1 tablet (10 mg total) by mouth daily. Gladis, Mary-Margaret, FNP  Active   torsemide  (DEMADEX ) 100 MG tablet [id number] Yes Take 1 tablet (100 mg total) by mouth daily. [name], FNP  Active Self, Pharmacy Records  traMADol (ULTRAM) 50 MG tablet [id number]  Take 1 tablet (50 mg total) by mouth every 8 (eight) hours as needed for up to 5 days.  Patient not taking: Reported on 07/12/2024   [name], FNP  Consider  Medication Status and Discontinue (Change in therapy)             Assessment/Plan:   Diabetes: - Currently uncontrolled; goal A1c <7%. Cardiorenal risk reduction opportunities for improvement.. Blood pressure is at goal <130/80. LDL is at goal.  - Reviewed long term cardiovascular and renal outcomes of uncontrolled blood sugar., Reviewed goal A1c, goal fasting, and goal 2 hour post prandial glucose. Recommended to check glucose with Dexcom regularly, Reviewed dietary modifications including limiting sweetened foods and beverages., Reviewed lifestyle modifications including increasing physical activity., and Reviewed signs and symptoms of hypoglycemia. - Recommend to start Mounjaro  2.5 mg and Jardiance 25 mg  . Continue all other medications as prescribed - Discussed side effects of gastrointestinal upset/nausea; eating smaller meals, avoiding high-fat foods, and remaining upright after eating may reduce nausea. Discussed that overeating is a major trigger of nausea with this class of medications, as often times patients will start to feel full sooner and may need to decrease portion sizes from what they were previously accustomed to. , Discussed potential side effects of dehydration, genitourinary infections., Advised on sick day rules (if a day with significantly reduced oral intake, serious vomiting, or diarrhea, hold SGLT2), Encouraged adequate hydration and genital hygiene.  - Per patient, her biggest goal is weight loss  Follow Up Plan: 1 month to assess cost of medications and increasing dose of Mounjaro   Powell Gallus, PharmD, MPH Pharmacy Resident    Mliss Tarry Griffin, PharmD, BCACP, CPP Clinical Pharmacist, [facility]

## 2024-07-12 ENCOUNTER — Ambulatory Visit (INDEPENDENT_AMBULATORY_CARE_PROVIDER_SITE_OTHER): Admitting: Pharmacist

## 2024-07-12 ENCOUNTER — Other Ambulatory Visit: Payer: Self-pay

## 2024-07-12 ENCOUNTER — Other Ambulatory Visit (HOSPITAL_BASED_OUTPATIENT_CLINIC_OR_DEPARTMENT_OTHER): Payer: Self-pay

## 2024-07-12 DIAGNOSIS — E119 Type 2 diabetes mellitus without complications: Secondary | ICD-10-CM | POA: Diagnosis not present

## 2024-07-12 DIAGNOSIS — Z794 Long term (current) use of insulin: Secondary | ICD-10-CM | POA: Diagnosis not present

## 2024-07-12 MED ORDER — EMPAGLIFLOZIN 25 MG PO TABS
25.0000 mg | ORAL_TABLET | Freq: Every day | ORAL | 1 refills | Status: AC
  Filled 2024-07-12: qty 90, 90d supply, fill #0
  Filled 2024-10-17: qty 90, 90d supply, fill #1

## 2024-07-12 MED ORDER — TIRZEPATIDE 5 MG/0.5ML ~~LOC~~ SOAJ
5.0000 mg | SUBCUTANEOUS | 0 refills | Status: DC
  Filled 2024-07-12: qty 2, 28d supply, fill #0

## 2024-07-12 MED ORDER — BASAGLAR KWIKPEN 100 UNIT/ML ~~LOC~~ SOPN
26.0000 [IU] | PEN_INJECTOR | Freq: Every day | SUBCUTANEOUS | 1 refills | Status: DC
  Filled 2024-07-12: qty 45, 173d supply, fill #0

## 2024-07-12 NOTE — Patient Instructions (Signed)
 CONTINUE Basaglar  26 units daily START Mounjaro  2.5mg  weekly TODAY, 07/12/24 AFTER 4 weeks you will increase to 5mg  weekly START Jardiance 25mg  daily tomorrow, 07/13/24 CONTINUE glipizide  daily  **Pick up Jardiance and Mounjaro  at [facility]**

## 2024-07-14 ENCOUNTER — Other Ambulatory Visit (HOSPITAL_BASED_OUTPATIENT_CLINIC_OR_DEPARTMENT_OTHER): Payer: Self-pay

## 2024-08-01 ENCOUNTER — Other Ambulatory Visit (HOSPITAL_COMMUNITY): Payer: Self-pay

## 2024-08-01 NOTE — Progress Notes (Signed)
 08/02/2024 Name: [name] MRN: [id number] DOB: 06/29/1961  Chief Complaint  Patient presents with   Diabetes    Kathryn Merritt is a 63 y.o. year old female who presented for a telephone visit.  I connected with  Kathryn Merritt on 08/02/24 by telephone and verified that I am speaking with the correct person using two identifiers. I discussed the limitations of evaluation and management by telemedicine. The patient expressed understanding and agreed to proceed.  Patient was located in her home and PharmD in PCP office during this visit.   They were referred to the pharmacist by their PCP for assistance in managing diabetes.    Subjective:  Care Team: Primary Care Provider: [name], FNP ; Next Scheduled Visit: 09/06/2024 Cardiologist: Quita Kicks; Next Scheduled Visit: 08/22/2024  Medication Access/Adherence  Current Pharmacy:  [facility] [address] [place] [address] Phone: [phone] Fax: [phone]  [facility] - [place], [place] - [address] [place] [address] Phone: [phone] Fax: [phone]  [facility] [address] [place] [address] Phone: [phone] Fax: [phone]   Patient reports affordability concerns with their medications: Yes  Patient reports access/transportation concerns to their pharmacy: No  Patient reports adherence concerns with their medications:  No     Diabetes:  Current medications:  - Glipizide  5 mg (1 tablet 2 times daily before meals) - Basaglar  26 units daily, feels gained 6-10 lbs since starting it (new rx on file sent 07/12/24),  - Mounjaro  2.5 mg weekly sample 07/12/24. Has some constipation, takes last dose of 2.5 mg tomorrow - has lost 5 pounds. Going up to 5 mg on 08/10/24.  - Jardiance 25 mg daily (picked up 07/14/24) Medications tried in the past:  - Metformin  with GI  upset - Mounjaro , too expensive before meeting deductible - Ozempic , wasn't covered - Farxiga , cost issues  Date of Download: 08/02/2024 % Time CGM is active: 99.9% Average Glucose: 195 mg/dL Glucose Management Indicator: 8%  Glucose Variability: 24.3% (goal <36%) Time in Goal:  - Time in range 70-180: 43% - Time above range: 57% - Time below range: 0%     Observed patterns: - Time in range from 9 pm to 6 am - Highs throughout day  Patient reports hypoglycemic s/sx including dizziness, shakiness, sweating. Mid-morning dropped to 82 mg/dL and patient had significant symptoms of low blood sugar; felt extremely weak and fatigue. Treated with orange juice and candy and took 2 hours or longer to get back to normal range.  Patient denies hyperglycemic symptoms including polyuria, polydipsia, polyphagia, nocturia, neuropathy, blurred vision.  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 yes (Rosuvastatin  10 mg daily); ACEi/ARB? yes (Lisinopril  30 mg daily) Last urinary albumin/creatinine ratio:  Lab Results  Component Value Date   MICRALBCREAT 23 06/05/2024   MICRALBCREAT 39 (H) 09/24/2022   MICRALBCREAT 62 (H) 06/23/2021   Last eye exam:  Lab Results  Component Value Date   HMDIABEYEEXA No Retinopathy 10/14/2021   Last foot exam: 06/05/2024 Tobacco Use:  Tobacco Use: Low Risk  (07/09/2024)   Patient History    Smoking Tobacco Use: Never    Smokeless Tobacco Use: Never    Passive Exposure: Not on file  Objective:  Lab Results  Component Value Date   HGBA1C 9.7 (H) 06/05/2024    Lab Results  Component Value Date   CREATININE 1.05 (H) 06/05/2024   BUN 23 06/05/2024   NA 144 06/05/2024   K 4.1 06/05/2024   CL 107 (H) 06/05/2024   CO2 23 06/05/2024    Lab Results   Component Value Date   CHOL 141 06/05/2024   HDL 49 06/05/2024   LDLCALC 65 06/05/2024   TRIG 159 (H) 06/05/2024   CHOLHDL 2.9 06/05/2024    Medications Reviewed Today     Reviewed by Gaile Powell HERO, RPH (Pharmacist) on 08/02/24 at 1001  Med List Status: <None>   Medication Order Taking? Sig Documenting Provider Last Dose Status Informant  acetaminophen  (TYLENOL ) 500 MG tablet [id number]  Take 1,000 mg by mouth every 8 (eight) hours as needed for moderate pain. [provider]  Active Self, Pharmacy Records  albuterol  (VENTOLIN  HFA) 108 [phone] Base) MCG/ACT inhaler [id number]  Inhale 2 puffs into the lungs every 6 (six) hours as needed for wheezing or shortness of breath. Gladis, Mary-Margaret, FNP  Active   apixaban  (ELIQUIS ) 5 MG TABS tablet [id number]  Take 1 tablet (5 mg total) by mouth 2 (two) times daily. Gladis Mary-Margaret, FNP  Active   blood glucose meter kit and supplies [id number]  Dispense based on patient and insurance preference. Use up to four times daily as directed. (FOR ICD-10 E10.9, E11.9). Therisa Benton PARAS, NP  Active Self, Pharmacy Records  cetirizine  (ZYRTEC  ALLERGY) 10 MG tablet [id number]  Take 1 tablet (10 mg total) by mouth daily.  Patient not taking: Reported on 07/12/2024   Leath-WarrenEtta PARAS, NP  Active Self, Pharmacy Records  clindamycin (CLEOCIN) 300 MG capsule [id number]  Take 1 capsule (300 mg total) by mouth 4 (four) times daily. [name], FNP  Active   Continuous Glucose Sensor (DEXCOM G7 SENSOR) MISC [id number]  Change sensor every 10 days [name], FNP  Active   dofetilide  (TIKOSYN ) 500 MCG capsule [id number]  Take 1 capsule (500 mcg total) by mouth 2 (two) times daily. Fenton, Clint R, PA  Active   empagliflozin (JARDIANCE) 25 MG TABS tablet [id number]  Take 1 tablet (25 mg total) by mouth daily. [name], FNP  Active     Discontinued 08/02/24 0957 (Change in therapy)            Med Note>> Gaile Powell HERO, [facility]   08/02/2024  9:57 AM Stop as increasing Mounjaro  dose    Insulin  Glargine (BASAGLAR  KWIKPEN) 100 UNIT/ML [id number]  Inject 26 Units into the skin daily. Titrate up to max dose of 50 units daily per PCP instruction. Gladis, Mary-Margaret, FNP  Active   Insulin  Pen Needle (PEN NEEDLES) 31G X 5 MM MISC [id number]  1 Needle by Does not apply route in the morning, at noon, in the evening, and at bedtime. [name], FNP  Active Self, Pharmacy Records  lisinopril  (ZESTRIL ) 20 MG tablet [id number]  TAKE ONE AND ONE-HALF TABLETS BY MOUTH EVERY DAY  Patient taking differently: Take 20 mg by mouth daily.   Gladis, Mary-Margaret, FNP  Active   lubiprostone  (AMITIZA ) 24 MCG capsule [id number]  Take 1 capsule (24 mcg total) by mouth 2 (two) times daily. Gladis, Mary-Margaret, FNP  Active   pantoprazole  (PROTONIX ) 40 MG tablet [id number]  Take 1 tablet (40 mg total) by mouth daily. [name], FNP  Active   potassium chloride  SA (KLOR-CON  M) 20 MEQ tablet [id number]  Take 1 tablet (20 mEq total) by mouth 2 (two) times daily.  Patient not taking: Reported on 07/12/2024   [name], FNP  Active Self, Pharmacy Records  rosuvastatin  (CRESTOR ) 10 MG tablet [id number]  Take 1 tablet (10 mg total) by mouth daily. Gladis, Mary-Margaret, FNP  Active   tirzepatide  (MOUNJARO ) 5 MG/0.5ML Pen [id number]  Inject 5 mg into the skin once a week. Start after completing 2.5 mg sample. Gladis, Mary-Margaret, FNP  Active   torsemide  (DEMADEX ) 100 MG tablet [id number]  Take 1 tablet (100 mg total) by mouth daily. [name], FNP  Active Self, Pharmacy Records            Assessment/Plan:   Diabetes: - Currently uncontrolled; goal A1c <7%. Cardiorenal risk reduction is optimized.. Blood pressure unable to assess. LDL is at goal.  - Reviewed long term cardiovascular and renal outcomes of uncontrolled blood sugar., Reviewed goal A1c, goal fasting, and goal 2 hour post  prandial glucose. Recommended to check glucose with symptoms and on Dexcom App, and Reviewed signs and symptoms of hypoglycemia. - Recommend to start Mounjaro  5 mg on 08/10/2024 ., Recommend to stop glipizide ., Recommend to continue Jardiance 25 mg., Titrate down to Basaglar  24 units in the morning. - Discussed side effects of gastrointestinal upset/nausea; eating smaller meals, avoiding high-fat foods, and remaining upright after eating may reduce nausea. Discussed that overeating is a major trigger of nausea with this class of medications, as often times patients will start to feel full sooner and may need to decrease portion sizes from what they were previously accustomed to. , Discussed potential side effects of dehydration, genitourinary infections., Encouraged adequate hydration and genital hygiene.   Follow Up Plan: 1 month phone call with pharmacist to titrate up Mounjaro , A1C with Ronal Quant on 09/06/2024.  Powell Gallus, PharmD, MPH Pharmacy Resident   Mliss Tarry Griffin, PharmD, BCACP, CPP Clinical Pharmacist, [facility]

## 2024-08-02 ENCOUNTER — Other Ambulatory Visit (INDEPENDENT_AMBULATORY_CARE_PROVIDER_SITE_OTHER): Payer: Self-pay

## 2024-08-02 DIAGNOSIS — E119 Type 2 diabetes mellitus without complications: Secondary | ICD-10-CM

## 2024-08-02 DIAGNOSIS — Z794 Long term (current) use of insulin: Secondary | ICD-10-CM

## 2024-08-06 ENCOUNTER — Other Ambulatory Visit: Payer: Self-pay | Admitting: *Deleted

## 2024-08-06 DIAGNOSIS — J40 Bronchitis, not specified as acute or chronic: Secondary | ICD-10-CM

## 2024-08-06 MED ORDER — ALBUTEROL SULFATE HFA 108 (90 BASE) MCG/ACT IN AERS: 2.0000 | INHALATION_SPRAY | Freq: Four times a day (QID) | RESPIRATORY_TRACT | 0 refills | Status: DC | PRN

## 2024-08-07 ENCOUNTER — Other Ambulatory Visit (HOSPITAL_COMMUNITY): Payer: Self-pay

## 2024-08-16 ENCOUNTER — Emergency Department (HOSPITAL_COMMUNITY)

## 2024-08-16 ENCOUNTER — Other Ambulatory Visit: Payer: Self-pay

## 2024-08-16 ENCOUNTER — Encounter (HOSPITAL_COMMUNITY): Payer: Self-pay

## 2024-08-16 ENCOUNTER — Emergency Department (HOSPITAL_COMMUNITY)
Admission: EM | Admit: 2024-08-16 | Discharge: 2024-08-16 | Disposition: A | Discharge status: Home / Self Care | Attending: Emergency Medicine | Admitting: Emergency Medicine

## 2024-08-16 DIAGNOSIS — K5900 Constipation, unspecified: Secondary | ICD-10-CM | POA: Diagnosis present

## 2024-08-16 DIAGNOSIS — Z7901 Long term (current) use of anticoagulants: Secondary | ICD-10-CM | POA: Diagnosis not present

## 2024-08-16 NOTE — ED Triage Notes (Signed)
 Pov from home. Cc of constipation since Tuesday. Has not taken any medication for help with it.  0/10

## 2024-08-16 NOTE — Discharge Instructions (Signed)
 Take Metamucil daily to help prevent constipation.  You can also take magnesium  citrate or milk of magnesia for the constipation.  Follow-up with your doctor if any problems

## 2024-08-17 NOTE — ED Provider Notes (Signed)
 [place] EMERGENCY DEPARTMENT AT [facility] Provider Note   CSN: [id number] Arrival date & time: 08/16/24  [id number]     Patient presents with: Constipation   Kathryn Merritt is a 63 y.o. female.   Patient complains of constipation.  It has been going on for a few days.  She has no abdominal pain or vomiting.  The history is provided by the patient and medical records. No language interpreter was used.  Constipation Severity:  Mild Timing:  Constant Progression:  Waxing and waning Chronicity:  New Context: not dehydration   Stool description:  None produced Relieved by:  Nothing Worsened by:  Nothing Ineffective treatments:  None tried Associated symptoms: no abdominal pain, no back pain and no diarrhea   Risk factors: no change in medication        Prior to Admission medications   Medication Sig Start Date End Date Taking? Authorizing Provider  acetaminophen  (TYLENOL ) 500 MG tablet Take 1,000 mg by mouth every 8 (eight) hours as needed for moderate pain.    [provider]  albuterol  (VENTOLIN  HFA) 108 (90 Base) MCG/ACT inhaler Inhale 2 puffs into the lungs every 6 (six) hours as needed for wheezing or shortness of breath. 08/06/24   Gladis, Mary-Margaret, FNP  apixaban  (ELIQUIS ) 5 MG TABS tablet Take 1 tablet (5 mg total) by mouth 2 (two) times daily. 06/05/24   [name], FNP  blood glucose meter kit and supplies Dispense based on patient and insurance preference. Use up to four times daily as directed. (FOR ICD-10 E10.9, E11.9). 10/01/21   Therisa Benton PARAS, NP  cetirizine  (ZYRTEC  ALLERGY) 10 MG tablet Take 1 tablet (10 mg total) by mouth daily. Patient not taking: Reported on 07/12/2024 07/20/23   Leath-Warren, Etta PARAS, NP  clindamycin  (CLEOCIN ) 300 MG capsule Take 1 capsule (300 mg total) by mouth 4 (four) times daily. 07/09/24   [name], FNP  Continuous Glucose Sensor (DEXCOM G7 SENSOR) MISC Change sensor every 10 days  06/05/24   [name], FNP  dofetilide  (TIKOSYN ) 500 MCG capsule Take 1 capsule (500 mcg total) by mouth 2 (two) times daily. 03/22/24   Fenton, Clint R, PA  empagliflozin  (JARDIANCE ) 25 MG TABS tablet Take 1 tablet (25 mg total) by mouth daily. 07/12/24   Gladis Mary-Margaret, FNP  Insulin  Glargine (BASAGLAR  KWIKPEN) 100 UNIT/ML Inject 26 Units into the skin daily. Titrate up to max dose of 50 units daily per PCP instruction. 07/12/24   Gladis Mary-Margaret, FNP  Insulin  Pen Needle (PEN NEEDLES) 31G X 5 MM MISC 1 Needle by Does not apply route in the morning, at noon, in the evening, and at bedtime. 03/12/24   Gladis, Mary-Margaret, FNP  lisinopril  (ZESTRIL ) 20 MG tablet TAKE ONE AND ONE-HALF TABLETS BY MOUTH EVERY DAY Patient taking differently: Take 20 mg by mouth daily. 06/05/24   Gladis Mary-Margaret, FNP  lubiprostone  (AMITIZA ) 24 MCG capsule Take 1 capsule (24 mcg total) by mouth 2 (two) times daily. 06/05/24   Gladis Mary-Margaret, FNP  pantoprazole  (PROTONIX ) 40 MG tablet Take 1 tablet (40 mg total) by mouth daily. 06/05/24   Gladis Mary-Margaret, FNP  potassium chloride  SA (KLOR-CON  M) 20 MEQ tablet Take 1 tablet (20 mEq total) by mouth 2 (two) times daily. Patient not taking: Reported on 07/12/2024 03/08/24   [name], FNP  rosuvastatin  (CRESTOR ) 10 MG tablet Take 1 tablet (10 mg total) by mouth daily. 06/05/24   Gladis Mary-Margaret, FNP  tirzepatide  (MOUNJARO ) 5 MG/0.5ML Pen Inject 5  mg into the skin once a week. Start after completing 2.5 mg sample. 07/12/24   Gladis, Mary-Margaret, FNP  torsemide  (DEMADEX ) 100 MG tablet Take 1 tablet (100 mg total) by mouth daily. 03/08/24   Gladis Mary-Margaret, FNP    Allergies: Metformin  and related    Review of Systems  Constitutional:  Negative for appetite change and fatigue.  HENT:  Negative for congestion, ear discharge and sinus pressure.   Eyes:  Negative for discharge.  Respiratory:  Negative for cough.    Cardiovascular:  Negative for chest pain.  Gastrointestinal:  Positive for constipation. Negative for abdominal pain and diarrhea.  Genitourinary:  Negative for frequency and hematuria.  Musculoskeletal:  Negative for back pain.  Skin:  Negative for rash.  Neurological:  Negative for seizures and headaches.  Psychiatric/Behavioral:  Negative for hallucinations.     Updated Vital Signs BP 134/82 (BP Location: Right Arm)   Pulse 68   Temp 98 F (36.7 C) (Oral)   Resp 18   Ht 5' 2 (1.575 m)   Wt 113.4 kg   SpO2 98%   BMI 45.73 kg/m   Physical Exam Vitals and nursing note reviewed.  Constitutional:      Appearance: She is well-developed.  HENT:     Head: Normocephalic.     Nose: Nose normal.  Eyes:     General: No scleral icterus.    Conjunctiva/sclera: Conjunctivae normal.  Neck:     Thyroid : No thyromegaly.  Cardiovascular:     Rate and Rhythm: Normal rate and regular rhythm.     Heart sounds: No murmur heard.    No friction rub. No gallop.  Pulmonary:     Breath sounds: No stridor. No wheezing or rales.  Chest:     Chest wall: No tenderness.  Abdominal:     General: There is no distension.     Tenderness: There is no abdominal tenderness. There is no rebound.  Musculoskeletal:        General: Normal range of motion.     Cervical back: Neck supple.  Lymphadenopathy:     Cervical: No cervical adenopathy.  Skin:    Findings: No erythema or rash.  Neurological:     Mental Status: She is alert and oriented to person, place, and time.     Motor: No abnormal muscle tone.     Coordination: Coordination normal.  Psychiatric:        Behavior: Behavior normal.     (all labs ordered are listed, but only abnormal results are displayed) Labs Reviewed - No data to display  EKG: None  Radiology: DG ABD ACUTE 2+V W 1V CHEST Result Date: 08/16/2024 CLINICAL DATA:  Abdominal pain.  Constipation. EXAM: DG ABDOMEN ACUTE WITH 1 VIEW CHEST COMPARISON:  10/24/2023  FINDINGS: There is no evidence of dilated bowel loops or free intraperitoneal air. Average stool burden noted. No radiopaque calculi or other significant radiographic abnormality is seen. Heart size and mediastinal contours are within normal limits. Both lungs are clear. IMPRESSION: Unremarkable bowel gas pattern.  No active cardiopulmonary disease. Electronically Signed   By: Norleen DELENA Kil M.D.   On: 08/16/2024 09:04     Procedures   Medications Ordered in the ED - No data to display                                  Medical Decision Making Amount and/or Complexity of Data Reviewed Radiology:  ordered.   Mild constipation.  Patient will start taking Metamucil and use magnesium  citrate or milk of magnesia and follow-up with her PCP     Final diagnoses:  Constipation, unspecified constipation type    ED Discharge Orders     None          Suzette Pac, MD 08/17/24 1556

## 2024-08-22 ENCOUNTER — Encounter (HOSPITAL_COMMUNITY): Payer: Self-pay

## 2024-08-22 ENCOUNTER — Ambulatory Visit (HOSPITAL_COMMUNITY): Admitting: Physician Assistant

## 2024-08-24 ENCOUNTER — Other Ambulatory Visit (HOSPITAL_COMMUNITY): Payer: Self-pay

## 2024-08-30 ENCOUNTER — Other Ambulatory Visit: Admitting: Pharmacist

## 2024-08-30 ENCOUNTER — Other Ambulatory Visit (HOSPITAL_BASED_OUTPATIENT_CLINIC_OR_DEPARTMENT_OTHER): Payer: Self-pay

## 2024-08-30 VITALS — BP 130/74 | HR 80

## 2024-08-30 DIAGNOSIS — Z7985 Long-term (current) use of injectable non-insulin antidiabetic drugs: Secondary | ICD-10-CM

## 2024-08-30 DIAGNOSIS — E119 Type 2 diabetes mellitus without complications: Secondary | ICD-10-CM

## 2024-08-30 DIAGNOSIS — Z794 Long term (current) use of insulin: Secondary | ICD-10-CM

## 2024-08-30 MED ORDER — TIRZEPATIDE 7.5 MG/0.5ML ~~LOC~~ SOAJ
7.5000 mg | SUBCUTANEOUS | 2 refills | Status: AC
  Filled 2024-08-30: qty 6, 84d supply, fill #0
  Filled 2024-09-03: qty 2, 28d supply, fill #0
  Filled 2024-09-17 – 2024-10-04 (×4): qty 2, 28d supply, fill #1

## 2024-08-30 NOTE — Progress Notes (Signed)
 08/30/2024 Name: Kathryn Merritt MRN: [id number] DOB: 1961/07/17  Chief Complaint  Patient presents with   Diabetes    Kathryn Merritt is a 63 y.o. year old female who presented for a telephone visit.  I connected with  Kathryn Merritt on 08/30/2024 by telephone and verified that I am speaking with the correct person using two identifiers. I discussed the limitations of evaluation and management by telemedicine. The patient expressed understanding and agreed to proceed.  Patient was located in her home and PharmD in PCP office during this visit.   They were referred to the pharmacist by their PCP for assistance in managing diabetes.    Subjective:  Care Team: Primary Care Provider: [name], FNP ; Next Scheduled Visit: 09/06/2024 Cardiologist: Quita Kicks; Next Scheduled Visit: 08/22/2024  Medication Access/Adherence  Current Pharmacy:  [facility] [address] [place] [address] Phone: [phone] Fax: [phone]  [facility] - [place], [place] - [address] [place] [address] Phone: [phone] Fax: [phone]  [facility] [address] [place] [address] Phone: [phone] Fax: [phone]  Patient reports affordability concerns with their medications: Yes  Patient reports access/transportation concerns to their pharmacy: No  Patient reports adherence concerns with their medications:  No    Diabetes:  Current medications:  - Basaglar  24 units daily, feels she's gained weight since starting, will work to titrate down as able - Mounjaro  5 mg weekly - has lost >5 pounds. Going up to 7.5 mg next.  - Jardiance  25 mg daily (picked up 07/14/24) Medications tried in the past:  - Metformin  with GI upset - Mounjaro , too expensive before meeting deductible - Ozempic , wasn't covered - Farxiga , cost issues  Date of Download:  08/02/2024 % Time CGM is active: 99.9% Average Glucose: 195 mg/dL Glucose Management Indicator: 8%  Glucose Variability: 24.3% (goal <36%) Time in Goal:  - Time in range 70-180: 43% - Time above range: 57% - Time below range: 0%     Observed patterns: - Time in range from 9 pm to 6 am - Highs throughout day  Patient reports hypoglycemic s/sx including dizziness, shakiness, sweating. Mid-morning dropped to 82 mg/dL and patient had significant symptoms of low blood sugar; felt extremely weak and fatigue. Treated with orange juice and candy and took 2 hours or longer to get back to normal range.  Patient denies hyperglycemic symptoms including polyuria, polydipsia, polyphagia, nocturia, neuropathy, blurred vision.  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 yes (Rosuvastatin  10 mg daily); ACEi/ARB? yes (Lisinopril  30 mg daily) Last urinary albumin/creatinine ratio:  Lab Results  Component Value Date   MICRALBCREAT 23 06/05/2024   MICRALBCREAT 39 (H) 09/24/2022   MICRALBCREAT 62 (H) 06/23/2021   Last eye exam:  Lab Results  Component Value Date   HMDIABEYEEXA No Retinopathy 10/14/2021   Last foot exam: 06/05/2024 Tobacco Use:  Tobacco Use: Low Risk (08/30/2024)   Patient History    Smoking Tobacco Use: Never    Smokeless Tobacco Use: Never    Passive Exposure: Not on file     Objective:  Lab Results  Component Value Date   HGBA1C 9.7 (H) 06/05/2024    Lab Results  Component Value Date   CREATININE 1.05 (H)  06/05/2024   BUN 23 06/05/2024   NA 144 06/05/2024   K 4.1 06/05/2024   CL 107 (H) 06/05/2024   CO2 23 06/05/2024    Lab Results  Component Value Date   CHOL 141 06/05/2024   HDL 49 06/05/2024   LDLCALC 65 06/05/2024   TRIG 159 (H) 06/05/2024   CHOLHDL 2.9  06/05/2024    Medications Reviewed Today   Medications were not reviewed in this encounter     Assessment/Plan:   Diabetes: - Currently uncontrolled; goal A1c <7%. Cardiorenal risk reduction is optimized.. Blood pressure unable to assess. LDL is at goal.  - Reviewed long term cardiovascular and renal outcomes of uncontrolled blood sugar., Reviewed goal A1c, goal fasting, and goal 2 hour post prandial glucose. Recommended to check glucose with symptoms and on Dexcom App, and Reviewed signs and symptoms of hypoglycemia. - Recommend to increase Mounjaro  to 7.5 mg weekly (called in 3 months to pharmacy to see if they can fill before end of year), Recommend to stop glipizide ., Recommend to continue Jardiance  25 mg., Titrate down to Basaglar  20-22 units in the morning. - Discussed side effects of gastrointestinal upset/nausea; eating smaller meals, avoiding high-fat foods, and remaining upright after eating may reduce nausea. Discussed that overeating is a major trigger of nausea with this class of medications, as often times patients will start to feel full sooner and may need to decrease portion sizes from what they were previously accustomed to. , Discussed potential side effects of dehydration, genitourinary infections., Encouraged adequate hydration and genital hygiene.   Follow Up Plan: A1C with Ronal Quant on 09/06/2024.  Chania Kochanski Dattero Andreka Stucki, PharmD, BCACP, CPP Clinical Pharmacist, [facility]

## 2024-09-03 ENCOUNTER — Ambulatory Visit: Admitting: Nurse Practitioner

## 2024-09-03 ENCOUNTER — Other Ambulatory Visit (HOSPITAL_BASED_OUTPATIENT_CLINIC_OR_DEPARTMENT_OTHER): Payer: Self-pay

## 2024-09-03 ENCOUNTER — Other Ambulatory Visit: Payer: Self-pay | Admitting: Nurse Practitioner

## 2024-09-03 ENCOUNTER — Ambulatory Visit: Payer: Self-pay | Admitting: Nurse Practitioner

## 2024-09-03 ENCOUNTER — Encounter: Payer: Self-pay | Admitting: Nurse Practitioner

## 2024-09-03 VITALS — BP 106/67 | HR 53 | Temp 97.2°F | Ht 62.0 in | Wt 254.0 lb

## 2024-09-03 DIAGNOSIS — R0989 Other specified symptoms and signs involving the circulatory and respiratory systems: Secondary | ICD-10-CM

## 2024-09-03 DIAGNOSIS — J40 Bronchitis, not specified as acute or chronic: Secondary | ICD-10-CM

## 2024-09-03 DIAGNOSIS — J0110 Acute frontal sinusitis, unspecified: Secondary | ICD-10-CM

## 2024-09-03 LAB — VERITOR SARS-COV-2 AND FLU A+B
BD Veritor SARS-CoV-2 Ag: NEGATIVE
Influenza A: NEGATIVE
Influenza B: NEGATIVE

## 2024-09-03 MED ORDER — AZITHROMYCIN 250 MG PO TABS: ORAL_TABLET | ORAL | 0 refills | Status: DC

## 2024-09-03 NOTE — Patient Instructions (Signed)
1. Take meds as prescribed °2. Use a cool mist humidifier especially during the winter months and when heat has been humid. °3. Use saline nose sprays frequently °4. Saline irrigations of the nose can be very helpful if done frequently. ° * 4X daily for 1 week* ° * Use of a nettie pot can be helpful with this. Follow directions with this* °5. Drink plenty of fluids °6. Keep thermostat turn down low °7.For any cough or congestion- mucinex or delsym OTC °8. For fever or aces or pains- take tylenol or ibuprofen appropriate for age and weight. ° * for fevers greater than 101 orally you may alternate ibuprofen and tylenol every  3 hours. °  ° °

## 2024-09-03 NOTE — Progress Notes (Signed)
 Subjective:    Patient ID: Kathryn Merritt, female    DOB: 14-Jul-1961, 63 y.o.   MRN: [id number]   Chief Complaint: uri   URI  This is a new problem. The current episode started in the past 7 days. The problem has been gradually worsening. There has been no fever. Associated symptoms include congestion, coughing, headaches, rhinorrhea and sinus pain. Pertinent negatives include no sore throat.    Patient Active Problem List   Diagnosis Date Noted   Anxiety 04/02/2024   Depression 04/02/2024   Heel spur 04/02/2024   Osteoarthritis 04/02/2024   Plantar fasciitis of left foot 04/02/2024   Pulmonary HTN (HCC) 04/02/2024   Uterine polyp 04/02/2024   Palpitations 02/02/2024   Diabetes mellitus treated with injections of non-insulin  medication (HCC) 08/01/2023   Hypercoagulable state due to paroxysmal atrial fibrillation (HCC) 03/29/2023   Lesion of left ear 06/24/2022   Vitamin D  deficiency 01/21/2022   Stage 3a chronic kidney disease (HCC) 01/21/2022   Constipation due to outlet dysfunction 01/13/2022   Esophageal dysphagia 01/13/2022   Subclinical hypothyroidism 10/23/2021   Gastroesophageal reflux disease 07/16/2021   OSA (obstructive sleep apnea) 07/14/2021   Hypertension 06/18/2021   Hyperlipidemia associated with type 2 diabetes mellitus (HCC) 06/18/2021   Paroxysmal atrial fibrillation (HCC) 03/18/2021   Morbid obesity (HCC) 03/18/2021   Controlled type 2 diabetes mellitus without complication, without long-term current use of insulin  (HCC) 03/18/2021       Review of Systems  Constitutional:  Positive for fatigue. Negative for chills and fever.  HENT:  Positive for congestion, rhinorrhea and sinus pain. Negative for sore throat.   Respiratory:  Positive for cough.   Neurological:  Positive for headaches.       Objective:   Physical Exam Constitutional:      Appearance: Normal appearance. She is obese.  HENT:     Right Ear: Tympanic membrane normal.     Left  Ear: Tympanic membrane normal.     Nose: Congestion and rhinorrhea present.     Right Sinus: Frontal sinus tenderness present.     Left Sinus: Frontal sinus tenderness present.     Mouth/Throat:     Pharynx: No oropharyngeal exudate or posterior oropharyngeal erythema.  Cardiovascular:     Rate and Rhythm: Regular rhythm.     Heart sounds: Normal heart sounds.  Pulmonary:     Effort: Pulmonary effort is normal.     Breath sounds: Normal breath sounds.  Musculoskeletal:     Cervical back: Normal range of motion and neck supple.  Neurological:     General: No focal deficit present.     Mental Status: She is alert and oriented to person, place, and time.  Psychiatric:        Mood and Affect: Mood normal.     BP 106/67   Pulse (!) 53   Temp (!) 97.2 F (36.2 C) (Temporal)   Ht 5' 2 (1.575 m)   Wt 254 lb (115.2 kg)   SpO2 97%   BMI 46.46 kg/m   Flu negative Covid negative     Assessment & Plan:   TRAVIS PURK in today with chief complaint of No chief complaint on file.   1. Chest congestion (Primary) - Veritor SARS-CoV-2 and Flu A+B  2. Acute non-recurrent frontal sinusitis 1. Take meds as prescribed 2. Use a cool mist humidifier especially during the winter months and when heat has been humid. 3. Use saline nose sprays frequently 4. Saline irrigations  of the nose can be very helpful if done frequently.  * 4X daily for 1 week*  * Use of a nettie pot can be helpful with this. Follow directions with this* 5. Drink plenty of fluids 6. Keep thermostat turn down low 7.For any cough or congestion- mucinex or delsym OTC 8. For fever or aces or pains- take tylenol  or ibuprofen  appropriate for age and weight.  * for fevers greater than 101 orally you may alternate ibuprofen  and tylenol  every  3 hours.    - azithromycin  (ZITHROMAX  Z-PAK) 250 MG tablet; As directed  Dispense: 6 tablet; Refill: 0    The above assessment and management plan was discussed with the  patient. The patient verbalized understanding of and has agreed to the management plan. Patient is aware to call the clinic if symptoms persist or worsen. Patient is aware when to return to the clinic for a follow-up visit. Patient educated on when it is appropriate to go to the emergency department.   Mary-Margaret Gladis, FNP

## 2024-09-06 ENCOUNTER — Encounter: Payer: Self-pay | Admitting: Nurse Practitioner

## 2024-09-06 ENCOUNTER — Ambulatory Visit: Payer: Self-pay | Admitting: Nurse Practitioner

## 2024-09-06 VITALS — BP 99/64 | HR 61 | Ht 62.0 in | Wt 254.0 lb

## 2024-09-06 DIAGNOSIS — E038 Other specified hypothyroidism: Secondary | ICD-10-CM

## 2024-09-06 DIAGNOSIS — E559 Vitamin D deficiency, unspecified: Secondary | ICD-10-CM

## 2024-09-06 DIAGNOSIS — F3341 Major depressive disorder, recurrent, in partial remission: Secondary | ICD-10-CM

## 2024-09-06 DIAGNOSIS — M25512 Pain in left shoulder: Secondary | ICD-10-CM

## 2024-09-06 DIAGNOSIS — K219 Gastro-esophageal reflux disease without esophagitis: Secondary | ICD-10-CM

## 2024-09-06 DIAGNOSIS — G4733 Obstructive sleep apnea (adult) (pediatric): Secondary | ICD-10-CM

## 2024-09-06 DIAGNOSIS — Z7985 Long-term (current) use of injectable non-insulin antidiabetic drugs: Secondary | ICD-10-CM

## 2024-09-06 DIAGNOSIS — R1319 Other dysphagia: Secondary | ICD-10-CM

## 2024-09-06 DIAGNOSIS — I10 Essential (primary) hypertension: Secondary | ICD-10-CM

## 2024-09-06 DIAGNOSIS — E119 Type 2 diabetes mellitus without complications: Secondary | ICD-10-CM

## 2024-09-06 DIAGNOSIS — N1831 Chronic kidney disease, stage 3a: Secondary | ICD-10-CM

## 2024-09-06 DIAGNOSIS — I480 Paroxysmal atrial fibrillation: Secondary | ICD-10-CM

## 2024-09-06 DIAGNOSIS — E1169 Type 2 diabetes mellitus with other specified complication: Secondary | ICD-10-CM

## 2024-09-06 DIAGNOSIS — F419 Anxiety disorder, unspecified: Secondary | ICD-10-CM

## 2024-09-06 MED ORDER — PREDNISONE 20 MG PO TABS: 40.0000 mg | ORAL_TABLET | Freq: Every day | ORAL | 0 refills | Status: AC

## 2024-09-06 NOTE — Patient Instructions (Signed)

## 2024-09-06 NOTE — Progress Notes (Signed)
 Subjective:    Patient ID: Kathryn Merritt, female    DOB: 1961-05-27, 63 y.o.   MRN: [id number]   Chief Complaint: medical management of chronic issues     HPI:  Kathryn Merritt is a 63 y.o. who identifies as a female who was assigned female at birth.   Social history: Lives with: by herself Work history: retired   Water Engineer in today for follow up of the following chronic medical issues:  1. Primary hypertension No c/o chest pain, sob or headache. Does not check blood pressure at home. BP Readings from Last 3 Encounters:  09/03/24 106/67  08/30/24 130/74  08/16/24 134/82      2. Paroxysmal atrial fibrillation (HCC) No c/o palpitations or heart racing  3. Hyperlipidemia associated with type 2 diabetes mellitus (HCC) Does not watch diet and does no dedicated exercise Lab Results  Component Value Date   CHOL 141 06/05/2024   HDL 49 06/05/2024   LDLCALC 65 06/05/2024   TRIG 159 (H) 06/05/2024   CHOLHDL 2.9 06/05/2024     4. Controlled type 2 diabetes mellitus without complication, without long-term current use of insulin  (HCC) 5. Diabetes mellitus treated with injections of non-insulin  medication (HCC) Blood sugars are running around 150-250. Has dexcom on and clinical pharmacist is checking on her frequently.  Lab Results  Component Value Date   HGBA1C 9.7 (H) 06/05/2024     6. Gastroesophageal reflux disease, unspecified whether esophagitis present Is on omeprazole  daily and is doing well.  7. Esophageal dysphagia No recent issues  8. OSA (obstructive sleep apnea) She does not wear her cpap  9. Subclinical hypothyroidism No issues that she is aware of. Lab Results  Component Value Date   TSH 3.470 12/27/2023     10. Vitamin D  deficiency Is not taking daily vitamin d  supplement Last vitamin D  Lab Results  Component Value Date   VD25OH 18.6 (L) 09/24/2022     11. Stage 3a chronic kidney disease (HCC) No voiding issues and no edema Lab Results   Component Value Date   CREATININE 1.05 (H) 06/05/2024     12. Depression Currently on no meds    09/06/2024    4:05 PM 09/03/2024   11:43 AM 07/09/2024   12:26 PM  Depression screen PHQ 2/9  Decreased Interest 0 0 0  Down, Depressed, Hopeless 0 0 0  PHQ - 2 Score 0 0 0  Altered sleeping  0   Tired, decreased energy  0   Change in appetite  0   Feeling bad or failure about yourself   0   Trouble concentrating  0   Moving slowly or fidgety/restless  0   Suicidal thoughts  0   PHQ-9 Score  0   Difficult doing work/chores  Not difficult at all      13. anxiety Currently on no meds    09/06/2024    4:05 PM 06/05/2024    4:12 PM 12/27/2023    9:43 AM 08/23/2023    4:28 PM  GAD 7 : Generalized Anxiety Score  Nervous, Anxious, on Edge 0 0 0 0  Control/stop worrying 0 0 0 0  Worry too much - different things 0 0 0 0  Trouble relaxing 0 0 0 0  Restless 0 0 0 0  Easily annoyed or irritable 0 0 0 0  Afraid - awful might happen 0 0 0 0  Total GAD 7 Score 0 0 0 0  Anxiety Difficulty Not difficult at  all Not difficult at all Not difficult at all Not difficult at all        14. Morbid obesity (HCC) Weight is up 4 lbs Wt Readings from Last 3 Encounters:  09/03/24 254 lb (115.2 kg)  08/16/24 250 lb (113.4 kg)  07/09/24 264 lb (119.7 kg)   BMI Readings from Last 3 Encounters:  09/03/24 46.46 kg/m  08/16/24 45.73 kg/m  07/09/24 46.77 kg/m      New complaints: None today  Allergies  Allergen Reactions   Metformin  And Related     Made patient feel bad/GI   Outpatient Encounter Medications as of 09/06/2024  Medication Sig   acetaminophen  (TYLENOL ) 500 MG tablet Take 1,000 mg by mouth every 8 (eight) hours as needed for moderate pain.   albuterol  (VENTOLIN  HFA) 108 (90 Base) MCG/ACT inhaler Inhale 2 puffs into the lungs every 6 (six) hours as needed for wheezing or shortness of breath.   apixaban  (ELIQUIS ) 5 MG TABS tablet Take 1 tablet (5 mg total) by mouth 2  (two) times daily.   azithromycin  (ZITHROMAX  Z-PAK) 250 MG tablet As directed   blood glucose meter kit and supplies Dispense based on patient and insurance preference. Use up to four times daily as directed. (FOR ICD-10 E10.9, E11.9).   cetirizine  (ZYRTEC  ALLERGY) 10 MG tablet Take 1 tablet (10 mg total) by mouth daily. (Patient not taking: Reported on 07/12/2024)   clindamycin  (CLEOCIN ) 300 MG capsule Take 1 capsule (300 mg total) by mouth 4 (four) times daily.   Continuous Glucose Sensor (DEXCOM G7 SENSOR) MISC Change sensor every 10 days   dofetilide  (TIKOSYN ) 500 MCG capsule Take 1 capsule (500 mcg total) by mouth 2 (two) times daily.   empagliflozin  (JARDIANCE ) 25 MG TABS tablet Take 1 tablet (25 mg total) by mouth daily.   Insulin  Glargine (BASAGLAR  KWIKPEN) 100 UNIT/ML Inject 26 Units into the skin daily. Titrate up to max dose of 50 units daily per PCP instruction.   Insulin  Pen Needle (PEN NEEDLES) 31G X 5 MM MISC 1 Needle by Does not apply route in the morning, at noon, in the evening, and at bedtime.   lisinopril  (ZESTRIL ) 20 MG tablet TAKE ONE AND ONE-HALF TABLETS BY MOUTH EVERY DAY (Patient taking differently: Take 20 mg by mouth daily.)   lubiprostone  (AMITIZA ) 24 MCG capsule Take 1 capsule (24 mcg total) by mouth 2 (two) times daily.   pantoprazole  (PROTONIX ) 40 MG tablet Take 1 tablet (40 mg total) by mouth daily.   potassium chloride  SA (KLOR-CON  M) 20 MEQ tablet Take 1 tablet (20 mEq total) by mouth 2 (two) times daily. (Patient not taking: Reported on 07/12/2024)   rosuvastatin  (CRESTOR ) 10 MG tablet Take 1 tablet (10 mg total) by mouth daily.   tirzepatide  (MOUNJARO ) 7.5 MG/0.5ML Pen Inject 7.5 mg into the skin once a week.   torsemide  (DEMADEX ) 100 MG tablet Take 1 tablet (100 mg total) by mouth daily.   No facility-administered encounter medications on file as of 09/06/2024.    Past Surgical History:  Procedure Laterality Date   BALLOON DILATION N/A 02/22/2022    Procedure: BALLOON DILATION;  Surgeon: Cindie Carlin POUR, DO;  [facility];  Service: Endoscopy;  Laterality: N/A;   BIOPSY  02/22/2022   Procedure: BIOPSY;  Surgeon: Cindie Carlin POUR, DO;  Location: [facility];  Service: Endoscopy;;   CARDIOVERSION N/A 03/18/2021   Procedure: CARDIOVERSION;  Surgeon: Debera Jayson MATSU, MD;  Location: [facility];  Service: Cardiovascular;  Laterality:  N/A;   CESAREAN SECTION     times 2   COLONOSCOPY WITH PROPOFOL  N/A 02/22/2022   Procedure: COLONOSCOPY WITH PROPOFOL ;  Surgeon: Cindie Carlin POUR, DO;  [facility]: [facility];  Service: Endoscopy;  Laterality: N/A;  7:30am   CORONARY ANGIOGRAPHY N/A 06/09/2022   Procedure: CORONARY ANGIOGRAPHY;  Surgeon: Wendel Lurena POUR, MD;  [facility]: [facility];  Service: Cardiovascular;  Laterality: N/A;   ESOPHAGOGASTRODUODENOSCOPY (EGD) WITH PROPOFOL  N/A 02/22/2022   Procedure: ESOPHAGOGASTRODUODENOSCOPY (EGD) WITH PROPOFOL ;  Surgeon: Cindie Carlin POUR, DO;  [facility];  Service: Endoscopy;  Laterality: N/A;   INCISION AND DRAINAGE ABSCESS Right    axilla   LITHOTRIPSY     POLYPECTOMY  02/22/2022   Procedure: POLYPECTOMY;  Surgeon: Cindie Carlin POUR, DO;  [facility];  Service: Endoscopy;;    Family History  Problem Relation Age of Onset   Hypertension Mother    Cancer Father    Hypertension Father    Cancer Brother    Heart disease Paternal Grandmother    Breast cancer Neg Hx    Colon cancer Neg Hx       Controlled substance contract: n/a     Review of Systems  Constitutional:  Negative for diaphoresis.  Eyes:  Negative for pain.  Respiratory:  Negative for shortness of breath.   Cardiovascular:  Negative for chest pain, palpitations and leg swelling.  Gastrointestinal:  Negative for abdominal pain.  Endocrine: Negative for polydipsia.  Skin:  Negative for rash.  Neurological:  Negative for dizziness, weakness and headaches.  Hematological:  Does not  bruise/bleed easily.  All other systems reviewed and are negative.      Objective:   Physical Exam Vitals and nursing note reviewed.  Constitutional:      General: She is not in acute distress.    Appearance: Normal appearance. She is well-developed.  HENT:     Head: Normocephalic.     Right Ear: Tympanic membrane normal.     Left Ear: Tympanic membrane normal.     Nose: Nose normal.     Mouth/Throat:     Mouth: Mucous membranes are moist.  Eyes:     Pupils: Pupils are equal, round, and reactive to light.  Neck:     Vascular: No carotid bruit or JVD.  Cardiovascular:     Rate and Rhythm: Normal rate and regular rhythm.     Heart sounds: Normal heart sounds.  Pulmonary:     Effort: Pulmonary effort is normal. No respiratory distress.     Breath sounds: Normal breath sounds. No wheezing or rales.  Chest:     Chest wall: No tenderness.  Abdominal:     General: Bowel sounds are normal. There is no distension or abdominal bruit.     Palpations: Abdomen is soft. There is no hepatomegaly, splenomegaly, mass or pulsatile mass.     Tenderness: There is no abdominal tenderness.  Musculoskeletal:        General: Normal range of motion.     Cervical back: Normal range of motion and neck supple.     Comments: Left  shoulder pain on abduction and internal rotation  Lymphadenopathy:     Cervical: No cervical adenopathy.  Skin:    General: Skin is warm and dry.  Neurological:     Mental Status: She is alert and oriented to person, place, and time.     Deep Tendon Reflexes: Reflexes are normal and symmetric.  Psychiatric:  Behavior: Behavior normal.        Thought Content: Thought content normal.        Judgment: Judgment normal.    BP 99/64   Pulse 61   Ht 5' 2 (1.575 m)   Wt 254 lb (115.2 kg)   SpO2 97%   BMI 46.46 kg/m     Hgba1c  7.8%     Assessment & Plan:  TOREE EDLING comes in today with chief complaint of medical management of chronic issues   SENG FOUTS in today with chief complaint of Medical Management of Chronic Issues   1. Primary hypertension (Primary) Low sodium diet - CBC with Differential/Platelet - CMP14+EGFR  2. Paroxysmal atrial fibrillation (HCC) Avoid caffeine Report heart racing or palpitations  3. Hyperlipidemia associated with type 2 diabetes mellitus (HCC) Low fat diet - Lipid panel  4. Controlled type 2 diabetes mellitus without complication, without long-term current use of insulin  (HCC) Continue carb counting Keep follow up with clinical pharmacist - Bayer DCA Hb A1c Waived  5. Diabetes mellitus treated with injections of non-insulin  medication (HCC)  6. Gastroesophageal reflux disease, unspecified whether esophagitis present Avoid spicy foods Do not eat 2 hours prior to bedtime   7. Esophageal dysphagia  8. OSA (obstructive sleep apnea) Wear cpap when can  9. Subclinical hypothyroidism Labs pending  10. Vitamin D  deficiency Vitamin d  supplement  11. Stage 3a chronic kidney disease (HCC) Labs pending  12. Recurrent major depressive disorder, in partial remission Stress management  13. Anxiety Stress management  14. Morbid obesity (HCC) Discussed diet and exercise for person with BMI >25 Will recheck weight in 3-6 months   15. Acute pain of left shoulder Moist heat Rest Watch blood sugars while on prednisone  - predniSONE  (DELTASONE ) 20 MG tablet; Take 2 tablets (40 mg total) by mouth daily with breakfast for 5 days. 2 po daily for 5 days  Dispense: 10 tablet; Refill: 0    The above assessment and management plan was discussed with the patient. The patient verbalized understanding of and has agreed to the management plan. Patient is aware to call the clinic if symptoms persist or worsen. Patient is aware when to return to the clinic for a follow-up visit. Patient educated on when it is appropriate to go to the emergency department.   Mary-Margaret Gladis, FNP

## 2024-09-07 ENCOUNTER — Ambulatory Visit: Payer: Self-pay | Admitting: Nurse Practitioner

## 2024-09-07 LAB — CBC WITH DIFFERENTIAL/PLATELET
Basophils Absolute: 0.1 x10E3/uL (ref 0.0–0.2)
Basos: 1 %
EOS (ABSOLUTE): 0.4 x10E3/uL (ref 0.0–0.4)
Eos: 5 %
Hematocrit: 43.2 % (ref 34.0–46.6)
Hemoglobin: 13.9 g/dL (ref 11.1–15.9)
Immature Grans (Abs): 0 x10E3/uL (ref 0.0–0.1)
Immature Granulocytes: 0 %
Lymphocytes Absolute: 3 x10E3/uL (ref 0.7–3.1)
Lymphs: 43 %
MCH: 29.8 pg (ref 26.6–33.0)
MCHC: 32.2 g/dL (ref 31.5–35.7)
MCV: 93 fL (ref 79–97)
Monocytes Absolute: 0.6 x10E3/uL (ref 0.1–0.9)
Monocytes: 8 %
Neutrophils Absolute: 3 x10E3/uL (ref 1.4–7.0)
Neutrophils: 43 %
Platelets: 194 x10E3/uL (ref 150–450)
RBC: 4.66 x10E6/uL (ref 3.77–5.28)
RDW: 13.1 % (ref 11.7–15.4)
WBC: 6.9 x10E3/uL (ref 3.4–10.8)

## 2024-09-07 LAB — CMP14+EGFR
ALT: 14 IU/L (ref 0–32)
AST: 16 IU/L (ref 0–40)
Albumin: 4 g/dL (ref 3.9–4.9)
Alkaline Phosphatase: 77 IU/L (ref 49–135)
BUN/Creatinine Ratio: 16 (ref 12–28)
BUN: 23 mg/dL (ref 8–27)
Bilirubin Total: 0.2 mg/dL (ref 0.0–1.2)
CO2: 23 mmol/L (ref 20–29)
Calcium: 9.5 mg/dL (ref 8.7–10.3)
Chloride: 105 mmol/L (ref 96–106)
Creatinine, Ser: 1.44 mg/dL — ABNORMAL HIGH (ref 0.57–1.00)
Globulin, Total: 1.9 g/dL (ref 1.5–4.5)
Glucose: 226 mg/dL — ABNORMAL HIGH (ref 70–99)
Potassium: 3.5 mmol/L (ref 3.5–5.2)
Sodium: 143 mmol/L (ref 134–144)
Total Protein: 5.9 g/dL — ABNORMAL LOW (ref 6.0–8.5)
eGFR: 41 mL/min/1.73 — ABNORMAL LOW

## 2024-09-07 LAB — LIPID PANEL
Chol/HDL Ratio: 2.5 ratio (ref 0.0–4.4)
Cholesterol, Total: 123 mg/dL (ref 100–199)
HDL: 49 mg/dL
LDL Chol Calc (NIH): 49 mg/dL (ref 0–99)
Triglycerides: 145 mg/dL (ref 0–149)
VLDL Cholesterol Cal: 25 mg/dL (ref 5–40)

## 2024-09-07 LAB — BAYER DCA HB A1C WAIVED: HB A1C (BAYER DCA - WAIVED): 7.8 % — ABNORMAL HIGH (ref 4.8–5.6)

## 2024-09-17 ENCOUNTER — Other Ambulatory Visit (HOSPITAL_BASED_OUTPATIENT_CLINIC_OR_DEPARTMENT_OTHER): Payer: Self-pay

## 2024-09-27 ENCOUNTER — Other Ambulatory Visit: Payer: Self-pay

## 2024-09-27 ENCOUNTER — Telehealth: Payer: Self-pay

## 2024-10-01 ENCOUNTER — Other Ambulatory Visit (HOSPITAL_BASED_OUTPATIENT_CLINIC_OR_DEPARTMENT_OTHER): Payer: Self-pay

## 2024-10-03 ENCOUNTER — Telehealth: Payer: Self-pay

## 2024-10-03 ENCOUNTER — Telehealth: Payer: Self-pay | Admitting: Family Medicine

## 2024-10-03 ENCOUNTER — Other Ambulatory Visit (HOSPITAL_COMMUNITY): Payer: Self-pay

## 2024-10-03 NOTE — Telephone Encounter (Signed)
 Copied from CRM [id number]. Topic: Clinical - Medication Prior Auth >> Oct 03, 2024  8:38 AM Marda MATSU wrote: Reason for CRM: Patient Kathryn Merritt calling on the authorization status for the Rx:  tirzepatide  (MOUNJARO ) 7.5 MG/0.5ML Pen  Please advise    [facility] [address] [place] [address] Phone: [phone] Fax: [phone] Hours: Mon-Fri 7:30am-7pm; Sat 8:00am-4:30p

## 2024-10-03 NOTE — Telephone Encounter (Signed)
 Pharmacy Patient Advocate Encounter   Received notification from Physician's Office that prior authorization for Mounjaro  7.5MG /0.5ML auto-injectors is required/requested.   Insurance verification completed.   The patient is insured through [facility] MEDICAID.   Per test claim: PA required; PA started via CoverMyMeds. KEY [id number] . Waiting for clinical questions to populate.

## 2024-10-04 ENCOUNTER — Other Ambulatory Visit (HOSPITAL_BASED_OUTPATIENT_CLINIC_OR_DEPARTMENT_OTHER): Payer: Self-pay

## 2024-10-04 ENCOUNTER — Other Ambulatory Visit (HOSPITAL_COMMUNITY): Payer: Self-pay

## 2024-10-04 NOTE — Telephone Encounter (Signed)
 Pharmacy Patient Advocate Encounter  Received notification from [facility] MEDICAID that Prior Authorization for Mounjaro  7.5MG /0.5ML auto-injectors has been APPROVED from 10/03/2024 to 10/03/2025. Ran test claim, Copay is $1101.54. This test claim was processed through [facility]- copay amounts may vary at other pharmacies due to pharmacy/plan contracts, or as the patient moves through the different stages of their insurance plan.   PA #/Case ID/Reference #: [id number]

## 2024-10-04 NOTE — Telephone Encounter (Signed)
 Notified via MyChart. LS

## 2024-10-08 ENCOUNTER — Other Ambulatory Visit: Payer: Self-pay | Admitting: Pulmonary Disease

## 2024-10-08 ENCOUNTER — Telehealth: Payer: Self-pay

## 2024-10-08 NOTE — Progress Notes (Signed)
 Complex Care Management Care Guide Note  10/08/2024 Name: Kathryn Merritt MRN: [id number] DOB: 04-11-1961  Kathryn Merritt is a 64 y.o. year old female who is a primary care patient of [name], FNP and is actively engaged with the care management team. I reached out to Verneita LITTIE Litter by phone today to assist with scheduling  with the Pharmacist.  Follow up plan: Telephone appointment with complex care management team member scheduled for:  10/17/2024  Jeoffrey Buffalo , RMA     [place]  [facility], [facility] Guide  Direct Dial: [phone]  Website: [email or web address]

## 2024-10-08 NOTE — Progress Notes (Signed)
 Complex Care Management Care Guide Note  10/08/2024 Name: Kathryn Merritt MRN: [id number] DOB: [birth year]  Kathryn Merritt is a 64 y.o. year old female who is a primary care patient of [name], FNP and is actively engaged with the care management team. I reached out to Verneita LITTIE Litter by phone today to assist with scheduling  with the Pharmacist.  Follow up plan: Unsuccessful telephone outreach attempt made. A HIPAA compliant phone message was left for the patient providing contact information and requesting a return call.  Jeoffrey Buffalo , RMA     [facility]  [facility], [facility] Guide  Direct Dial: [phone]  Website: [email or web address]

## 2024-10-10 ENCOUNTER — Telehealth: Payer: Self-pay

## 2024-10-10 ENCOUNTER — Other Ambulatory Visit (HOSPITAL_COMMUNITY): Payer: Self-pay

## 2024-10-11 ENCOUNTER — Telehealth: Payer: Self-pay

## 2024-10-11 NOTE — Telephone Encounter (Signed)
 Pharmacy Patient Advocate Encounter   Received notification from [facility] KEY that prior authorization for [facility] G7 SENSOR is required/requested.   Insurance verification completed.   The patient is insured through [facility] COMMERCIAL.   Per test claim: PA required; PA submitted to above mentioned insurance via Latent Key/confirmation #/EOC [id number]. Status is pending

## 2024-10-15 NOTE — Telephone Encounter (Signed)
 Pharmacy Patient Advocate Encounter  Received notification from [facility] [employer] (COMMERCIAL) that Prior Authorization for [facility] G7 SENSOR has been APPROVED from 10/11/24 to 10/11/25   PA #/Case ID/Reference #: [id number]

## 2024-10-17 ENCOUNTER — Other Ambulatory Visit: Payer: Self-pay

## 2024-10-17 DIAGNOSIS — E119 Type 2 diabetes mellitus without complications: Secondary | ICD-10-CM

## 2024-10-17 MED ORDER — DEXCOM G7 SENSOR MISC: 5 refills | Status: AC

## 2024-10-17 NOTE — Progress Notes (Unsigned)
 Increase basaglar  to 26-30 units  $3850

## 2024-10-17 NOTE — Telephone Encounter (Signed)
PA approved in other encounter.

## 2024-10-18 ENCOUNTER — Other Ambulatory Visit (HOSPITAL_BASED_OUTPATIENT_CLINIC_OR_DEPARTMENT_OTHER): Payer: Self-pay

## 2024-10-22 ENCOUNTER — Other Ambulatory Visit: Payer: Self-pay | Admitting: Nurse Practitioner

## 2024-10-22 DIAGNOSIS — K219 Gastro-esophageal reflux disease without esophagitis: Secondary | ICD-10-CM

## 2024-10-22 NOTE — Telephone Encounter (Signed)
 PA For [employer].

## 2024-10-23 ENCOUNTER — Other Ambulatory Visit (HOSPITAL_COMMUNITY): Payer: Self-pay

## 2024-10-23 ENCOUNTER — Other Ambulatory Visit (HOSPITAL_BASED_OUTPATIENT_CLINIC_OR_DEPARTMENT_OTHER): Payer: Self-pay

## 2024-10-23 ENCOUNTER — Telehealth: Payer: Self-pay | Admitting: Pharmacy Technician

## 2024-10-23 MED ORDER — LANTUS SOLOSTAR 100 UNIT/ML ~~LOC~~ SOPN
26.0000 [IU] | PEN_INJECTOR | Freq: Every day | SUBCUTANEOUS | 99 refills | Status: DC
  Filled 2024-10-23: qty 15, 57d supply, fill #0

## 2024-10-23 NOTE — Telephone Encounter (Signed)
 Insurance prefers Dentist over basaglar - [facility]. Continue 34u daily  Meds ordered this encounter  Medications   insulin  glargine (LANTUS  SOLOSTAR) 100 UNIT/ML Solostar Pen    Sig: Inject 26 Units into the skin daily.    Dispense:  15 mL    Refill:  PRN    Supervising Provider:   MARYANNE CHEW A [[id number]]   Mary-Margaret Gladis, FNP

## 2024-10-23 NOTE — Telephone Encounter (Signed)
 Called and spoke with Kathryn Merritt. She states that she has been on this med for almost a year and would like to stay on it. Can we resend to the pharmacy and try a prior authorization?

## 2024-10-23 NOTE — Telephone Encounter (Signed)
 Pharmacy Patient Advocate Encounter   Received notification from [employer] KEY that prior authorization for Basaglar  KwikPen 100UNIT/ML pen-injectors is required/requested.   Insurance verification completed.   The patient is insured through [employer]COMM[employer]).   Per test claim:  Lantus  Solostar is preferred by the insurance.  If suggested medication is appropriate, Please send in a new RX and discontinue this one. If not, please advise as to why it's not appropriate so that we may request a Prior Authorization. Please note, some preferred medications may still require a PA.  If the suggested medications have not been trialed and there are no contraindications to their use, the PA will not be submitted, as it will not be approved. Archived Key: [id number]

## 2024-10-23 NOTE — Telephone Encounter (Signed)
 Please advise.

## 2024-10-24 ENCOUNTER — Other Ambulatory Visit (HOSPITAL_BASED_OUTPATIENT_CLINIC_OR_DEPARTMENT_OTHER): Payer: Self-pay

## 2024-10-24 ENCOUNTER — Other Ambulatory Visit: Payer: Self-pay

## 2024-10-24 ENCOUNTER — Other Ambulatory Visit: Payer: Self-pay | Admitting: Nurse Practitioner

## 2024-10-24 MED ORDER — BASAGLAR KWIKPEN 100 UNIT/ML ~~LOC~~ SOPN
34.0000 [IU] | PEN_INJECTOR | Freq: Every day | SUBCUTANEOUS | 1 refills | Status: AC
  Filled 2024-10-24: qty 15, 44d supply, fill #0

## 2024-10-24 NOTE — Telephone Encounter (Signed)
 I can try but will be more expensive at least

## 2024-10-24 NOTE — Progress Notes (Signed)
 Prescription sent in for basaglar  with request for prior auth. We will see what happens. Just make sure dont run out of meds in the mean time.  Meds ordered this encounter  Medications   Insulin  Glargine (BASAGLAR  KWIKPEN) 100 UNIT/ML    Sig: Inject 34 Units into the skin daily.    Dispense:  15 mL    Refill:  1    Patient wants to remain on basaglar - can we try prior auth before chening to lantus     Supervising Provider:   MARYANNE CHEW A [[id number]]   Mary-Margaret Gladis, FNP

## 2024-10-25 ENCOUNTER — Telehealth: Payer: Self-pay

## 2024-10-25 ENCOUNTER — Other Ambulatory Visit (HOSPITAL_BASED_OUTPATIENT_CLINIC_OR_DEPARTMENT_OTHER): Payer: Self-pay

## 2024-10-25 NOTE — Telephone Encounter (Signed)
 Patient requesting PA to be done for her Basaglar  Rx.

## 2024-10-25 NOTE — Telephone Encounter (Signed)
 Pharmacy Patient Advocate Encounter   Received notification from Physician's Office that prior authorization for BASAGLAR  is required/requested.   Insurance verification completed.   The patient is insured through [facility] COMMERCIAL.   Key [id number]  PA required; However, NEW/RECENT labs/notes are needed to complete & submit PA request.  (Previous insurance preferred Basaglar . New insurance prefers Lantus . PCP would like to attempt PA before changing).

## 2024-10-25 NOTE — Telephone Encounter (Signed)
New encounter created for PA

## 2024-12-03 ENCOUNTER — Ambulatory Visit: Admitting: Nurse Practitioner
